# Patient Record
Sex: Male | Born: 1960
Health system: Southern US, Community
[De-identification: ages and names within clinical notes are randomized; demographics above are authoritative.]

## PROBLEM LIST (undated history)

## (undated) DIAGNOSIS — Z720 Tobacco use: Secondary | ICD-10-CM

## (undated) DIAGNOSIS — I213 ST elevation (STEMI) myocardial infarction of unspecified site: Secondary | ICD-10-CM

## (undated) DIAGNOSIS — Z5900 Homelessness unspecified: Secondary | ICD-10-CM

## (undated) DIAGNOSIS — I2510 Atherosclerotic heart disease of native coronary artery without angina pectoris: Secondary | ICD-10-CM

## (undated) DIAGNOSIS — E119 Type 2 diabetes mellitus without complications: Secondary | ICD-10-CM

## (undated) DIAGNOSIS — M549 Dorsalgia, unspecified: Secondary | ICD-10-CM

## (undated) DIAGNOSIS — J449 Chronic obstructive pulmonary disease, unspecified: Secondary | ICD-10-CM

## (undated) DIAGNOSIS — I10 Essential (primary) hypertension: Secondary | ICD-10-CM

## (undated) DIAGNOSIS — F1010 Alcohol abuse, uncomplicated: Secondary | ICD-10-CM

## (undated) HISTORY — DX: Tobacco use: Z72.0

## (undated) HISTORY — DX: Alcohol abuse, uncomplicated: F10.10

## (undated) HISTORY — DX: Atherosclerotic heart disease of native coronary artery without angina pectoris: I25.10

## (undated) HISTORY — DX: Homelessness unspecified: Z59.00

## (undated) HISTORY — PX: APPENDECTOMY: SHX54

---

## 2001-09-12 ENCOUNTER — Emergency Department (HOSPITAL_COMMUNITY): Admission: EM | Admit: 2001-09-12 | Discharge: 2001-09-12 | Payer: Self-pay | Admitting: *Deleted

## 2003-06-19 ENCOUNTER — Encounter: Payer: Self-pay | Admitting: Emergency Medicine

## 2003-06-19 ENCOUNTER — Emergency Department (HOSPITAL_COMMUNITY): Admission: EM | Admit: 2003-06-19 | Discharge: 2003-06-20 | Payer: Self-pay | Admitting: Emergency Medicine

## 2003-06-20 ENCOUNTER — Encounter: Payer: Self-pay | Admitting: Emergency Medicine

## 2003-06-28 ENCOUNTER — Emergency Department (HOSPITAL_COMMUNITY): Admission: EM | Admit: 2003-06-28 | Discharge: 2003-06-28 | Payer: Self-pay | Admitting: Emergency Medicine

## 2004-08-23 ENCOUNTER — Emergency Department (HOSPITAL_COMMUNITY): Admission: EM | Admit: 2004-08-23 | Discharge: 2004-08-24 | Payer: Self-pay | Admitting: Emergency Medicine

## 2006-02-16 ENCOUNTER — Emergency Department (HOSPITAL_COMMUNITY): Admission: EM | Admit: 2006-02-16 | Discharge: 2006-02-16 | Payer: Self-pay

## 2008-11-11 ENCOUNTER — Emergency Department (HOSPITAL_COMMUNITY): Admission: EM | Admit: 2008-11-11 | Discharge: 2008-11-11 | Payer: Self-pay | Admitting: Emergency Medicine

## 2012-03-28 ENCOUNTER — Encounter (HOSPITAL_COMMUNITY): Payer: Self-pay | Admitting: *Deleted

## 2012-03-28 ENCOUNTER — Emergency Department (HOSPITAL_COMMUNITY)
Admission: EM | Admit: 2012-03-28 | Discharge: 2012-03-28 | Disposition: A | Discharge status: Home / Self Care | Payer: Self-pay | Attending: Emergency Medicine | Admitting: Emergency Medicine

## 2012-03-28 DIAGNOSIS — F17200 Nicotine dependence, unspecified, uncomplicated: Secondary | ICD-10-CM | POA: Insufficient documentation

## 2012-03-28 DIAGNOSIS — I10 Essential (primary) hypertension: Secondary | ICD-10-CM | POA: Insufficient documentation

## 2012-03-28 DIAGNOSIS — G8929 Other chronic pain: Secondary | ICD-10-CM | POA: Insufficient documentation

## 2012-03-28 HISTORY — DX: Essential (primary) hypertension: I10

## 2012-03-28 MED ORDER — OXYCODONE-ACETAMINOPHEN 5-325 MG PO TABS: 1.0000 | ORAL_TABLET | Freq: Four times a day (QID) | ORAL | Status: AC | PRN

## 2012-03-28 MED ORDER — PREDNISONE 10 MG PO TABS: 40.0000 mg | ORAL_TABLET | Freq: Every day | ORAL | Status: DC

## 2012-03-28 MED ORDER — KETOROLAC TROMETHAMINE 60 MG/2ML IM SOLN
60.0000 mg | Freq: Once | INTRAMUSCULAR | Status: AC
  Administered 2012-03-28: 60 mg via INTRAMUSCULAR
  Filled 2012-03-28: qty 2
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
## 2012-03-28 NOTE — ED Provider Notes (Signed)
Medical screening examination/treatment/procedure(s) were performed by non-physician practitioner and as supervising physician I was immediately available for consultation/collaboration.  Ethelda Chick, MD 03/28/12 1227

## 2012-03-28 NOTE — Discharge Instructions (Signed)
Please try to set up care with a primary doctor for continued management of your chronic pain. Return to the ER for re-evaluation if you develop new numbness or weakness in your legs, numbness in your groin, or lose your bowel contents on yourself.  If you have no primary doctor, here are some resources that may be helpful:  Medicaid-accepting [facility] Providers:   - [facility]- [address]      [phone]      Mon-Fri 9am-7pm, Sat 9am-1pm   - [facility]- [address] [place]      [phone]   - [facility]- [address], [address] [place]      [phone]   [facility]- [address]      [phone]   - Renaye Rakers- [address] [place], [address]      [phone]      Only accepts [place] patients       after they have her name applied to their card   Self Pay (no insurance) in [place]:   - Sickle Cell Patients: Dr Willey Blade, [facility]      [address] [place]      [phone]   - [phone]   - Physician Referral Service- [phone]   - [facility]- [address] [place]      [phone]   [facility] [place]- [address]   - [facility]- see information above      (Speak to [employer] if you do not have insurance)   - [facility]- [address] [place]      [phone]   - [facility] [place]- [address]      [phone]   - [facility]- [address]      [phone]   - Dr Julio Sicks-  [address], [place]      [phone]   - [facility]- [address]      [phone]   - [facility]- [address]      [phone]      Also [address]      [phone]   - [facility]- [address]      [phone]      1st and 3rd Saturday every month, 10am-1pm Other agencies that provide  inexpensive medical care:    [facility]  [phone]    [facility]  [phone]    [facility]  [phone]    [facility]  [phone]    [facility]  [phone]  General Information: Finding a doctor when you do not have health insurance can be tricky. Although you are not limited by an insurance plan, you are of course limited by her finances and how much but he can pay out of pocket.  What are your options if you don't have health insurance?   1) Find a Librarian, academic and Pay Out of Pocket Although you won't have to find out who is covered by your insurance plan, it is a good idea to ask around and get recommendations. You will then need to call the office and see if the doctor you have chosen will accept you as a new patient and  what types of options they offer for patients who are self-pay. Some doctors offer discounts or will set up payment plans for their patients who do not have insurance, but you will need to ask so you aren't surprised when you get to your appointment.  2) Contact Your Local Health Department Not all health departments have doctors that can see patients for sick visits, but many do, so it is worth a call to see if yours does. If you don't know where your local health department is, you can check in your phone book. The CDC also has a tool to help you locate your state's health department, and many state websites also have listings of all of their local health departments.  3) Find a Walk-in Clinic If your illness is not likely to be very severe or complicated, you may want to try a walk in clinic. These are popping up all over the country in pharmacies, drugstores, and shopping centers. They're usually staffed by nurse practitioners or physician assistants that have been trained to treat common illnesses and complaints. They're usually fairly quick and inexpensive. However, if you have serious medical issues or chronic medical problems,  these are probably not your best option         Chronic Back Pain When back pain lasts longer than 3 months, it is called chronic back pain.This pain can be frustrating, but the cause of the pain is rarely dangerous.People with chronic back pain often go through certain periods that are more intense (flare-ups). CAUSES Chronic back pain can be caused by wear and tear (degeneration) on different structures in your back. These structures may include bones, ligaments, or discs. This degeneration may result in more pressure being placed on the nerves that travel to your legs and feet. This can lead to pain traveling from the low back down the back of the legs. When pain lasts longer than 3 months, it is not unusual for people to experience anxiety or depression. Anxiety and depression can also contribute to low back pain. TREATMENT  Establish a regular exercise plan. This is critical to improving your functional level.   Have a self-management plan for when you flare-up. Flare-ups rarely require a medical visit. Regular exercise will help reduce the intensity and frequency of your flare-ups.   Manage how you feel about your back pain and the rest of your life. Anxiety, depression, and feeling that you cannot alter your back pain have been shown to make back pain more intense and debilitating.   Medicines should never be your only treatment. They should be used along with other treatments to help you return to a more active lifestyle.   Procedures such as injections or surgery may be helpful but are rarely necessary. You may be able to get the same results with physical therapy or chiropractic care.  HOME CARE INSTRUCTIONS  Avoid bending, heavy lifting, prolonged sitting, and activities which make the problem worse.   Continue normal activity as much as possible.   Take brief periods of rest throughout the day to reduce your pain during flare-ups.   Follow your back exercise  rehabilitation program. This can help reduce symptoms and prevent more pain.   Only take over-the-counter or prescription medicines as directed by your caregiver. Muscle relaxants are sometimes prescribed. Narcotic pain medicine is discouraged for long-term pain, since addiction is a possible outcome.   If you smoke, quit.   Eat healthy foods and maintain a recommended body weight.  SEEK IMMEDIATE  MEDICAL CARE IF:   You have weakness or numbness in one of your legs or feet.   You have trouble controlling your bladder or bowels.   You develop nausea, vomiting, abdominal pain, shortness of breath, or fainting.  Document Released: 01/20/2005 Document Revised: 12/02/2011 Document Reviewed: 11/27/2011 [facility] Patient Information 2012 [place], [place].        Hypertension As your heart beats, it forces blood through your arteries. This force is your blood pressure. If the pressure is too high, it is called hypertension (HTN) or high blood pressure. HTN is dangerous because you may have it and not know it. High blood pressure may mean that your heart has to work harder to pump blood. Your arteries may be narrow or stiff. The extra work puts you at risk for heart disease, stroke, and other problems.  Blood pressure consists of two numbers, a higher number over a lower, 110/72, for example. It is stated as "110 over 72." The ideal is below 120 for the top number (systolic) and under 80 for the bottom (diastolic). Write down your blood pressure today. You should pay close attention to your blood pressure if you have certain conditions such as:  Heart failure.   Prior heart attack.   Diabetes   Chronic kidney disease.   Prior stroke.   Multiple risk factors for heart disease.  To see if you have HTN, your blood pressure should be measured while you are seated with your arm held at the level of the heart. It should be measured at least twice. A one-time elevated blood pressure reading  (especially in the Emergency Department) does not mean that you need treatment. There may be conditions in which the blood pressure is different between your right and left arms. It is important to see your caregiver soon for a recheck. Most people have essential hypertension which means that there is not a specific cause. This type of high blood pressure may be lowered by changing lifestyle factors such as:  Stress.   Smoking.   Lack of exercise.   Excessive weight.   Drug/tobacco/alcohol use.   Eating less salt.  Most people do not have symptoms from high blood pressure until it has caused damage to the body. Effective treatment can often prevent, delay or reduce that damage. TREATMENT  When a cause has been identified, treatment for high blood pressure is directed at the cause. There are a large number of medications to treat HTN. These fall into several categories, and your caregiver will help you select the medicines that are best for you. Medications may have side effects. You should review side effects with your caregiver. If your blood pressure stays high after you have made lifestyle changes or started on medicines,   Your medication(s) may need to be changed.   Other problems may need to be addressed.   Be certain you understand your prescriptions, and know how and when to take your medicine.   Be sure to follow up with your caregiver within the time frame advised (usually within two weeks) to have your blood pressure rechecked and to review your medications.   If you are taking more than one medicine to lower your blood pressure, make sure you know how and at what times they should be taken. Taking two medicines at the same time can result in blood pressure that is too low.  SEEK IMMEDIATE MEDICAL CARE IF:  You develop a severe headache, blurred or changing vision, or confusion.  You have unusual weakness or numbness, or a faint feeling.   You have severe chest or  abdominal pain, vomiting, or breathing problems.  MAKE SURE YOU:   Understand these instructions.   Will watch your condition.   Will get help right away if you are not doing well or get worse.  Document Released: 12/13/2005 Document Revised: 12/02/2011 Document Reviewed: 08/02/2008 [facility] Patient Information 2012 [place], [place].
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
## 2012-12-22 DIAGNOSIS — M129 Arthropathy, unspecified: Secondary | ICD-10-CM | POA: Insufficient documentation

## 2012-12-22 DIAGNOSIS — F329 Major depressive disorder, single episode, unspecified: Secondary | ICD-10-CM | POA: Insufficient documentation

## 2013-02-21 ENCOUNTER — Emergency Department (HOSPITAL_COMMUNITY): Payer: Self-pay

## 2013-02-21 ENCOUNTER — Emergency Department (HOSPITAL_COMMUNITY)
Admission: EM | Admit: 2013-02-21 | Discharge: 2013-02-21 | Disposition: A | Discharge status: Home / Self Care | Payer: Self-pay | Attending: Emergency Medicine | Admitting: Emergency Medicine

## 2013-02-21 ENCOUNTER — Encounter (HOSPITAL_COMMUNITY): Payer: Self-pay | Admitting: Emergency Medicine

## 2013-02-21 DIAGNOSIS — F17200 Nicotine dependence, unspecified, uncomplicated: Secondary | ICD-10-CM | POA: Insufficient documentation

## 2013-02-21 DIAGNOSIS — I10 Essential (primary) hypertension: Secondary | ICD-10-CM | POA: Insufficient documentation

## 2013-02-21 DIAGNOSIS — M545 Low back pain, unspecified: Secondary | ICD-10-CM | POA: Insufficient documentation

## 2013-02-21 DIAGNOSIS — M549 Dorsalgia, unspecified: Secondary | ICD-10-CM

## 2013-02-21 DIAGNOSIS — IMO0002 Reserved for concepts with insufficient information to code with codable children: Secondary | ICD-10-CM | POA: Insufficient documentation

## 2013-02-21 DIAGNOSIS — Z87828 Personal history of other (healed) physical injury and trauma: Secondary | ICD-10-CM | POA: Insufficient documentation

## 2013-02-21 MED ORDER — OXYCODONE-ACETAMINOPHEN 5-325 MG PO TABS
2.0000 | ORAL_TABLET | Freq: Once | ORAL | Status: AC
  Administered 2013-02-21: 2 via ORAL
  Filled 2013-02-21: qty 2

## 2013-02-21 MED ORDER — OXYCODONE-ACETAMINOPHEN 5-325 MG PO TABS: 2.0000 | ORAL_TABLET | Freq: Four times a day (QID) | ORAL | Status: DC | PRN

## 2013-02-21 MED ORDER — KETOROLAC TROMETHAMINE 60 MG/2ML IM SOLN
60.0000 mg | Freq: Once | INTRAMUSCULAR | Status: AC
Start: 2013-02-21 — End: 2013-02-21
  Administered 2013-02-21: 60 mg via INTRAMUSCULAR
  Filled 2013-02-21: qty 2

## 2013-02-21 MED ORDER — METHOCARBAMOL 500 MG PO TABS: 500.0000 mg | ORAL_TABLET | Freq: Two times a day (BID) | ORAL | Status: DC
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
## 2013-02-21 NOTE — ED Notes (Signed)
Patient transported to X-ray 
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
## 2013-02-21 NOTE — ED Provider Notes (Signed)
Medical screening examination/treatment/procedure(s) were performed by non-physician practitioner and as supervising physician I was immediately available for consultation/collaboration.   Glynn Octave, MD 02/21/13 [phone]

## 2013-05-07 DIAGNOSIS — G609 Hereditary and idiopathic neuropathy, unspecified: Secondary | ICD-10-CM | POA: Insufficient documentation

## 2013-05-27 ENCOUNTER — Emergency Department (HOSPITAL_COMMUNITY)
Admission: EM | Admit: 2013-05-27 | Discharge: 2013-05-27 | Disposition: A | Discharge status: Home / Self Care | Payer: Self-pay | Attending: Emergency Medicine | Admitting: Emergency Medicine

## 2013-05-27 ENCOUNTER — Encounter (HOSPITAL_COMMUNITY): Payer: Self-pay | Admitting: *Deleted

## 2013-05-27 DIAGNOSIS — M545 Low back pain, unspecified: Secondary | ICD-10-CM | POA: Insufficient documentation

## 2013-05-27 DIAGNOSIS — I10 Essential (primary) hypertension: Secondary | ICD-10-CM | POA: Insufficient documentation

## 2013-05-27 DIAGNOSIS — M549 Dorsalgia, unspecified: Secondary | ICD-10-CM

## 2013-05-27 DIAGNOSIS — Z87828 Personal history of other (healed) physical injury and trauma: Secondary | ICD-10-CM | POA: Insufficient documentation

## 2013-05-27 DIAGNOSIS — F17200 Nicotine dependence, unspecified, uncomplicated: Secondary | ICD-10-CM | POA: Insufficient documentation

## 2013-05-27 DIAGNOSIS — G8929 Other chronic pain: Secondary | ICD-10-CM | POA: Insufficient documentation

## 2013-05-27 HISTORY — DX: Dorsalgia, unspecified: M54.9

## 2013-05-27 MED ORDER — OXYCODONE-ACETAMINOPHEN 5-325 MG PO TABS
2.0000 | ORAL_TABLET | Freq: Once | ORAL | Status: AC
  Administered 2013-05-27: 2 via ORAL
  Filled 2013-05-27: qty 2

## 2013-05-27 NOTE — ED Provider Notes (Signed)
Medical screening examination/treatment/procedure(s) were performed by non-physician practitioner and as supervising physician I was immediately available for consultation/collaboration.    Vida Roller, MD 05/27/13 [phone]
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
## 2014-01-29 DIAGNOSIS — M549 Dorsalgia, unspecified: Secondary | ICD-10-CM | POA: Insufficient documentation

## 2014-01-29 DIAGNOSIS — J45909 Unspecified asthma, uncomplicated: Secondary | ICD-10-CM | POA: Insufficient documentation

## 2014-03-19 DIAGNOSIS — IMO0002 Reserved for concepts with insufficient information to code with codable children: Secondary | ICD-10-CM | POA: Insufficient documentation

## 2014-03-19 DIAGNOSIS — Z6833 Body mass index (BMI) 33.0-33.9, adult: Secondary | ICD-10-CM | POA: Insufficient documentation

## 2014-03-19 DIAGNOSIS — I10 Essential (primary) hypertension: Secondary | ICD-10-CM | POA: Insufficient documentation

## 2014-03-19 DIAGNOSIS — E1165 Type 2 diabetes mellitus with hyperglycemia: Secondary | ICD-10-CM | POA: Insufficient documentation

## 2014-12-10 ENCOUNTER — Emergency Department (HOSPITAL_COMMUNITY)
Admission: EM | Admit: 2014-12-10 | Discharge: 2014-12-10 | Disposition: A | Discharge status: Home / Self Care | Payer: Self-pay | Attending: Emergency Medicine | Admitting: Emergency Medicine

## 2014-12-10 ENCOUNTER — Encounter (HOSPITAL_COMMUNITY): Payer: Self-pay | Admitting: Emergency Medicine

## 2014-12-10 ENCOUNTER — Ambulatory Visit (HOSPITAL_COMMUNITY): Admission: RE | Admit: 2014-12-10 | Payer: Self-pay | Source: Ambulatory Visit

## 2014-12-10 ENCOUNTER — Emergency Department (HOSPITAL_COMMUNITY): Payer: Self-pay

## 2014-12-10 DIAGNOSIS — W270XXA Contact with workbench tool, initial encounter: Secondary | ICD-10-CM | POA: Insufficient documentation

## 2014-12-10 DIAGNOSIS — Y9289 Other specified places as the place of occurrence of the external cause: Secondary | ICD-10-CM | POA: Insufficient documentation

## 2014-12-10 DIAGNOSIS — I10 Essential (primary) hypertension: Secondary | ICD-10-CM | POA: Insufficient documentation

## 2014-12-10 DIAGNOSIS — Z23 Encounter for immunization: Secondary | ICD-10-CM | POA: Insufficient documentation

## 2014-12-10 DIAGNOSIS — Z720 Tobacco use: Secondary | ICD-10-CM | POA: Insufficient documentation

## 2014-12-10 DIAGNOSIS — S61412A Laceration without foreign body of left hand, initial encounter: Secondary | ICD-10-CM | POA: Insufficient documentation

## 2014-12-10 DIAGNOSIS — Y998 Other external cause status: Secondary | ICD-10-CM | POA: Insufficient documentation

## 2014-12-10 DIAGNOSIS — Y9389 Activity, other specified: Secondary | ICD-10-CM | POA: Insufficient documentation

## 2014-12-10 MED ORDER — OXYCODONE-ACETAMINOPHEN 5-325 MG PO TABS: 1.0000 | ORAL_TABLET | ORAL | Status: DC | PRN

## 2014-12-10 MED ORDER — OXYCODONE-ACETAMINOPHEN 5-325 MG PO TABS
1.0000 | ORAL_TABLET | Freq: Once | ORAL | Status: AC
  Administered 2014-12-10: 1 via ORAL
  Filled 2014-12-10: qty 1

## 2014-12-10 MED ORDER — LIDOCAINE HCL (PF) 1 % IJ SOLN
5.0000 mL | Freq: Once | INTRAMUSCULAR | Status: AC
  Administered 2014-12-10: 5 mL via INTRADERMAL
  Filled 2014-12-10: qty 5

## 2014-12-10 MED ORDER — TETANUS-DIPHTH-ACELL PERTUSSIS 5-2.5-18.5 LF-MCG/0.5 IM SUSP
0.5000 mL | Freq: Once | INTRAMUSCULAR | Status: AC
  Administered 2014-12-10: 0.5 mL via INTRAMUSCULAR
  Filled 2014-12-10: qty 0.5

## 2014-12-10 NOTE — ED Provider Notes (Signed)
CSN: [id number]     Arrival date & time 12/10/14  1836 History  This chart was scribed for non-physician practitioner, Tommy RainwaterShari A Golda Zavalza PA-C, working with Purvis SheffieldForrest Harrison, MD by Freida Busmaniana Omoyeni, ED Scribe. This patient was seen in room WTR6/WTR6 and the patient's care was started at 8:18 PM.    Chief Complaint  Patient presents with  . Extremity Laceration      The history is provided by the patient. No language interpreter was used.     HPI Comments:  Warren Salas is a 53 y.o. male who presents to the Emergency Department complaining of a laceration to his left hand after an accidental stabbing injury with a 1 inch chisel about 2 hours ago. He reports moderate- severe pain that has been constant since injury. He believes his last tetanus was about 5-6 years ago. Pt is right hand dominant. No alleviating factors noted.   Past Medical History  Diagnosis Date  . Hypertension   . Back pain    Past Surgical History  Procedure Laterality Date  . Appendectomy     Family History  Problem Relation Age of Onset  . Hypertension Mother   . Diabetes Mother   . Hypertension Father   . Diabetes Father    History  Substance Use Topics  . Smoking status: Current Every Day Smoker  . Smokeless tobacco: Not on file  . Alcohol Use: Yes    Review of Systems  Constitutional: Negative for fever and chills.  HENT: Negative for congestion and nosebleeds.   Eyes: Negative for redness.  Respiratory: Negative for shortness of breath.   Cardiovascular: Negative for chest pain.  Gastrointestinal: Negative for abdominal pain.  Genitourinary: Negative for difficulty urinating.  Skin: Positive for wound.  Neurological: Negative for headaches.      Allergies  Review of patient's allergies indicates no known allergies.  Home Medications   Prior to Admission medications   Medication Sig Start Date End Date Taking? Authorizing Provider  ibuprofen (ADVIL,MOTRIN) 200 MG tablet Take 400 mg by  mouth every 6 (six) hours as needed. pain    Historical Provider, MD  naproxen sodium (ANAPROX) 220 MG tablet Take 220 mg by mouth every 6 (six) hours as needed (pain).    Historical Provider, MD   BP 170/93 mmHg  Pulse 92  Temp(Src) 98.3 F (36.8 C) (Oral)  Resp 18  SpO2 100% Physical Exam  Constitutional: He is oriented to person, place, and time. He appears well-developed and well-nourished.  HENT:  Head: Normocephalic and atraumatic.  Eyes: Conjunctivae are normal.  Cardiovascular: Normal rate.   Pulmonary/Chest: Effort normal.  Abdominal: He exhibits no distension.  Musculoskeletal:  3 cm laceration to left hand dorsum at inner phalangeal space between first and second digits noted FROM all digits of the left hand Distal cap refill intact  Neurological: He is alert and oriented to person, place, and time.  Skin: Skin is warm and dry.  Psychiatric: He has a normal mood and affect.  Nursing note and vitals reviewed.   ED Course  Procedures   DIAGNOSTIC STUDIES:  Oxygen Saturation is 100% on RA, normal by my interpretation.    COORDINATION OF CARE:  8:23 PM Discussed treatment plan with pt at bedside and pt agreed to plan.  LACERATION REPAIR Performed by: Arnoldo HookerShari A Egbert Seidel PA-C, Consent: Verbal consent obtained. Risks and benefits: risks, benefits and alternatives were discussed Patient identity confirmed: provided demographic data Time out performed prior to procedure Prepped and Draped in normal  sterile fashion Wound explored, irrigated copiously; no visualized tendons  Laceration Location: left hand dorsum at inner phalangeal space between first and second digits Laceration Length: 3 cm linear No Foreign Bodies seen or palpated Anesthesia: local infiltration Local anesthetic: lidocaine 1%  Anesthetic total: 2 ml Irrigation method: syringe Amount of cleaning: standard Skin closure: yes Number of sutures or staples: 6 sutures with 4-o proline Technique: simple  interrupted Patient tolerance: Patient tolerated the procedure well with no immediate complications.  Labs Review Labs Reviewed - No data to display  Imaging Review No results found.   EKG Interpretation None      MDM   Final diagnoses:  None    1. Laceration, left hand  Uncomplicated laceration to hand, repaired as above. Tetanus updated, pain managed. Clean wound.   I personally performed the services described in this documentation, which was scribed in my presence. The recorded information has been reviewed and is accurate.     Arnoldo HookerShari A Onda Kattner, PA-C 12/11/14 2054  Purvis SheffieldForrest Harrison, MD 12/12/14 [phone]

## 2014-12-10 NOTE — ED Notes (Signed)
Pt accidentally stabbed his left hand with a wood chisel, held pressure to wound, states hand was "gushing."

## 2014-12-10 NOTE — Discharge Instructions (Signed)
Td Vaccine (Tetanus and Diphtheria): What You Need to Know °1. Why get vaccinated? °Tetanus  and diphtheria are very serious diseases. They are rare in the [place] today, but people who do become infected often have severe complications. Td vaccine is used to protect adolescents and adults from both of these diseases. °Both tetanus and diphtheria are infections caused by bacteria. Diphtheria spreads from person to person through coughing or sneezing. Tetanus-causing bacteria enter the body through cuts, scratches, or wounds. °TETANUS (Lockjaw) causes painful muscle tightening and stiffness, usually all over the body. °· It can lead to tightening of muscles in the head and neck so you can't open your mouth, swallow, or sometimes even breathe. Tetanus kills about 1 out of every 5 people who are infected. °DIPHTHERIA can cause a thick coating to form in the back of the throat. °· It can lead to breathing problems, paralysis, heart failure, and death. °Before vaccines, the [place] saw as many as 200,000 cases a year of diphtheria and hundreds of cases of tetanus. Since vaccination began, cases of both diseases have dropped by about 99%. °2. Td vaccine °Td vaccine can protect adolescents and adults from tetanus and diphtheria. Td is usually given as a booster dose every 10 years but it can also be given earlier after a severe and dirty wound or burn. °Your doctor can give you more information. °Td may safely be given at the same time as other vaccines. °3. Some people should not get this vaccine °· If you ever had a life-threatening allergic reaction after a dose of any tetanus or diphtheria containing vaccine, OR if you have a severe allergy to any part of this vaccine, you should not get Td. Tell your doctor if you have any severe allergies. °· Talk to your doctor if you: °¨ have epilepsy or another nervous system problem, °¨ had severe pain or swelling after any vaccine containing diphtheria or  tetanus, °¨ ever had Guillain Barré Syndrome (GBS), °¨ aren't feeling well on the day the shot is scheduled. °4. Risks of a vaccine reaction °With a vaccine, like any medicine, there is a chance of side effects. These are usually mild and go away on their own. °Serious side effects are also possible, but are very rare. °Most people who get Td vaccine do not have any problems with it. °Mild Problems  following Td °(Did not interfere with activities) °· Pain where the shot was given (about 8 people in 10) °· Redness or swelling where the shot was given (about 1 person in 3) °· Mild fever (about 1 person in 15) °· Headache or Tiredness (uncommon) °Moderate Problems following Td °(Interfered with activities, but did not require medical attention) °· Fever over 102°F (rare) °Severe Problems  following Td °(Unable to perform usual activities; required medical attention) °· Swelling, severe pain, bleeding and/or redness in the arm where the shot was given (rare). °Problems that could happen after any vaccine: °· Brief fainting spells can happen after any medical procedure, including vaccination. Sitting or lying down for about 15 minutes can help prevent fainting, and injuries caused by a fall. Tell your doctor if you feel dizzy, or have vision changes or ringing in the ears. °· Severe shoulder pain and reduced range of motion in the arm where a shot was given can happen, very rarely, after a vaccination. °· Severe allergic reactions from a vaccine are very rare, estimated at less than 1 in a million doses. If one were to occur,   it would usually be within a few minutes to a few hours after the vaccination. 5. What if there is a serious reaction? What should I look for?  Look for anything that concerns you, such as signs of a severe allergic reaction, very high fever, or behavior changes. Signs of a severe allergic reaction can include hives, swelling of the face and throat, difficulty breathing, a fast heartbeat,  dizziness, and weakness. These would usually start a few minutes to a few hours after the vaccination. What should I do?  If you think it is a severe allergic reaction or other emergency that can't wait, call 9-1-1 or get the person to the nearest hospital. Otherwise, call your doctor.  Afterward, the reaction should be reported to the Vaccine Adverse Event Reporting System (VAERS). Your doctor might file this report, or you can do it yourself through the VAERS web site at www.vaers.LAgents.nohhs.gov, or by calling 1-(269) 394-2068. VAERS is only for reporting reactions. They do not give medical advice. 6. The National Vaccine Injury Compensation Program The [employer] Vaccine Injury Compensation Program (VICP) is a federal program that was created to compensate people who may have been injured by certain vaccines. Persons who believe they may have been injured by a vaccine can learn about the program and about filing a claim by calling 1-(458)123-5707 or visiting the VICP website at SpiritualWord.atwww.hrsa.gov/vaccinecompensation. 7. How can I learn more?  Ask your doctor.  Contact your local or state health department.  Contact the Centers for Disease Control and Prevention (CDC):  Call (220) 347-28401-989-004-7793 (1-800-CDC-INFO)  Visit CDC's website at PicCapture.uywww.cdc.gov/vaccines CDC Td Vaccine Interim VIS (01/30/13) Document Released: 10/10/2006 Document Revised: 04/29/2014 Document Reviewed: 03/27/2014 [facility] Patient Information 2015 [employer] [place]. This information is not intended to replace advice given to you by your health care provider. Make sure you discuss any questions you have with your health care provider. Sutured Wound Care Sutures are stitches that can be used to close wounds. Wound care helps prevent pain and infection.  HOME CARE INSTRUCTIONS   Rest and elevate the injured area until all the pain and swelling are gone.  Only take over-the-counter or prescription medicines for pain, discomfort, or fever as directed  by your caregiver.  After 48 hours, gently wash the area with mild soap and water once a day, or as directed. Rinse off the soap. Pat the area dry with a clean towel. Do not rub the wound. This may cause bleeding.  Follow your caregiver's instructions for how often to change the bandage (dressing). Stop using a dressing after 2 days or after the wound stops draining.  If the dressing sticks, moisten it with soapy water and gently remove it.  Apply ointment on the wound as directed.  Avoid stretching a sutured wound.  Drink enough fluids to keep your urine clear or pale yellow.  Follow up with your caregiver for suture removal as directed.  Use sunscreen on your wound for the next 3 to 6 months so the scar will not darken. SEEK IMMEDIATE MEDICAL CARE IF:   Your wound becomes red, swollen, hot, or tender.  You have increasing pain in the wound.  You have a red streak that extends from the wound.  There is pus coming from the wound.  You have a fever.  You have shaking chills.  There is a bad smell coming from the wound.  You have persistent bleeding from the wound. MAKE SURE YOU:   Understand these instructions.  Will watch your condition.  Will get help right away if you are not doing well or get worse. Document Released: 01/20/2005 Document Revised: 03/06/2012 Document Reviewed: 04/18/2011 [facility] Patient Information 2015 [place], [place]. This information is not intended to replace advice given to you by your health care provider. Make sure you discuss any questions you have with your health care provider.

## 2016-01-26 DIAGNOSIS — Z0289 Encounter for other administrative examinations: Secondary | ICD-10-CM | POA: Diagnosis not present

## 2016-01-26 DIAGNOSIS — G8929 Other chronic pain: Secondary | ICD-10-CM | POA: Diagnosis not present

## 2016-01-26 DIAGNOSIS — F329 Major depressive disorder, single episode, unspecified: Secondary | ICD-10-CM | POA: Diagnosis not present

## 2016-01-26 DIAGNOSIS — I10 Essential (primary) hypertension: Secondary | ICD-10-CM | POA: Diagnosis not present

## 2016-01-26 DIAGNOSIS — Z23 Encounter for immunization: Secondary | ICD-10-CM | POA: Diagnosis not present

## 2016-01-26 DIAGNOSIS — E118 Type 2 diabetes mellitus with unspecified complications: Secondary | ICD-10-CM | POA: Diagnosis not present

## 2016-01-26 DIAGNOSIS — E1165 Type 2 diabetes mellitus with hyperglycemia: Secondary | ICD-10-CM | POA: Diagnosis not present

## 2016-01-26 DIAGNOSIS — Z9181 History of falling: Secondary | ICD-10-CM | POA: Diagnosis not present

## 2016-01-26 DIAGNOSIS — M545 Low back pain: Secondary | ICD-10-CM | POA: Diagnosis not present

## 2016-01-27 DIAGNOSIS — Z9181 History of falling: Secondary | ICD-10-CM | POA: Insufficient documentation

## 2016-05-03 DIAGNOSIS — F1490 Cocaine use, unspecified, uncomplicated: Secondary | ICD-10-CM | POA: Insufficient documentation

## 2017-01-06 ENCOUNTER — Encounter (HOSPITAL_COMMUNITY): Payer: Self-pay | Admitting: Emergency Medicine

## 2017-01-06 ENCOUNTER — Emergency Department (HOSPITAL_COMMUNITY)
Admission: EM | Admit: 2017-01-06 | Discharge: 2017-01-06 | Disposition: A | Discharge status: Home / Self Care | Payer: Medicare Other | Attending: Emergency Medicine | Admitting: Emergency Medicine

## 2017-01-06 DIAGNOSIS — I10 Essential (primary) hypertension: Secondary | ICD-10-CM | POA: Diagnosis not present

## 2017-01-06 DIAGNOSIS — F17200 Nicotine dependence, unspecified, uncomplicated: Secondary | ICD-10-CM | POA: Diagnosis not present

## 2017-01-06 DIAGNOSIS — F1410 Cocaine abuse, uncomplicated: Secondary | ICD-10-CM | POA: Diagnosis not present

## 2017-01-06 DIAGNOSIS — Z79899 Other long term (current) drug therapy: Secondary | ICD-10-CM | POA: Insufficient documentation

## 2017-01-06 DIAGNOSIS — R030 Elevated blood-pressure reading, without diagnosis of hypertension: Secondary | ICD-10-CM | POA: Diagnosis not present

## 2017-01-06 DIAGNOSIS — F22 Delusional disorders: Secondary | ICD-10-CM | POA: Diagnosis present

## 2017-01-06 DIAGNOSIS — Z7982 Long term (current) use of aspirin: Secondary | ICD-10-CM | POA: Diagnosis not present

## 2017-01-06 LAB — BASIC METABOLIC PANEL
Anion gap: 12 (ref 5–15)
CO2: 21 mmol/L — ABNORMAL LOW (ref 22–32)
CREATININE: 0.67 mg/dL (ref 0.61–1.24)
Calcium: 8.7 mg/dL — ABNORMAL LOW (ref 8.9–10.3)
Chloride: 105 mmol/L (ref 101–111)
Glucose, Bld: 97 mg/dL (ref 65–99)
Potassium: 3.9 mmol/L (ref 3.5–5.1)
SODIUM: 138 mmol/L (ref 135–145)

## 2017-01-06 LAB — RAPID URINE DRUG SCREEN, HOSP PERFORMED
Amphetamines: NOT DETECTED
BARBITURATES: NOT DETECTED
Benzodiazepines: POSITIVE — AB
Cocaine: POSITIVE — AB
Opiates: NOT DETECTED
TETRAHYDROCANNABINOL: NOT DETECTED

## 2017-01-06 LAB — SALICYLATE LEVEL

## 2017-01-06 LAB — CBC
HCT: 52.2 % — ABNORMAL HIGH (ref 39.0–52.0)
Hemoglobin: 19 g/dL — ABNORMAL HIGH (ref 13.0–17.0)
MCH: 32.9 pg (ref 26.0–34.0)
MCHC: 36.4 g/dL — AB (ref 30.0–36.0)
MCV: 90.3 fL (ref 78.0–100.0)
PLATELETS: 165 10*3/uL (ref 150–400)
RBC: 5.78 MIL/uL (ref 4.22–5.81)
RDW: 13.4 % (ref 11.5–15.5)
WBC: 7.8 10*3/uL (ref 4.0–10.5)

## 2017-01-06 LAB — ETHANOL: ALCOHOL ETHYL (B): 182 mg/dL — AB (ref <5)

## 2017-01-06 LAB — ACETAMINOPHEN LEVEL: Acetaminophen (Tylenol), Serum: <10 ug/mL — ABNORMAL LOW (ref 10–30)

## 2017-01-06 MED ORDER — LORAZEPAM 1 MG PO TABS
1.0000 mg | ORAL_TABLET | Freq: Once | ORAL | Status: AC
  Administered 2017-01-06: 1 mg via ORAL
  Filled 2017-01-06: qty 1

## 2017-01-06 NOTE — ED Notes (Signed)
Bed: [facility] Expected date:  Expected time:  Means of arrival:  Comments: EMS/ETOH 

## 2017-01-06 NOTE — ED Notes (Signed)
Patient left AMA.

## 2017-01-06 NOTE — ED Triage Notes (Signed)
Per EMS patient brought in from home for paranoia after drug use.  Patient was found on his front porch of his house. Patient reports that he thought he bought heroin. Patient sniffed yellow substance that he thought was heroin but either laced or meth due to patients paranoia reaction from drugs which is not his usual reaction to heroin.  Patient initially tachycardic around 130, now 104, BP 132/95, R 22, O2 98% on room air. Cbg 92.  18 in left AC Versed 2.5mg  given in route.

## 2017-01-07 NOTE — ED Provider Notes (Signed)
[facility]-EMERGENCY DEPT Provider Note   CSN: [id number] Arrival date & time: 01/06/17  1158     History   Chief Complaint Chief Complaint  Patient presents with  . paranoia after drug use    HPI Warren Salas is a 56 y.o. male.  HPI   56 yo M with PMHx of HTN here with drug abuse. Pt states that he snorted what he thought was heroin earlier today. It was a yellow powder, which is what his heroin "normally looks like." Shortly after snorting, pt states he became "really agitated" with a "racing heart," which is unusual for him with heroin. He became concerned that he may have gotten "something else" rather than heroin and called EMS. Pt initially agitated on EMS arrival but received 2.5 mg versed with improvement. Currently, pt states he feels much better. No longer feels agitated. Denies any CP, SOB, palpitation. No SI, HI, or AVH.  Past Medical History:  Diagnosis Date  . Back pain   . Hypertension     There are no active problems to display for this patient.   Past Surgical History:  Procedure Laterality Date  . APPENDECTOMY         Home Medications    Prior to Admission medications   Medication Sig Start Date End Date Taking? Authorizing Provider  albuterol (PROVENTIL HFA;VENTOLIN HFA) 108 (90 Base) MCG/ACT inhaler Inhale 1-2 puffs into the lungs every 4 (four) hours as needed for wheezing or shortness of breath.  05/07/13  Yes Historical Provider, MD  aspirin EC 81 MG tablet Take 81 mg by mouth daily. 05/13/15  Yes Historical Provider, MD  atorvastatin (LIPITOR) 10 MG tablet Take 10 mg by mouth daily. 05/13/15  Yes Historical Provider, MD  budesonide-formoterol (SYMBICORT) 160-4.5 MCG/ACT inhaler Inhale 2 puffs into the lungs 2 (two) times daily as needed (For shortness of breath.).    Yes Historical Provider, MD  DULoxetine (CYMBALTA) 30 MG capsule Take 30 mg by mouth 2 (two) times daily.  01/26/16 01/26/17 Yes Historical Provider, MD  HYDROcodone-acetaminophen (NORCO)  7.5-325 MG tablet Take 1 tablet by mouth every 8 (eight) hours as needed (For pain.).  04/28/16  Yes Historical Provider, MD  lisinopril (PRINIVIL,ZESTRIL) 10 MG tablet Take 10 mg by mouth daily. 01/26/16  Yes Historical Provider, MD  metFORMIN (GLUCOPHAGE) 500 MG tablet Take 500 mg by mouth 2 (two) times daily. 01/26/16  Yes Historical Provider, MD  oxyCODONE-acetaminophen (PERCOCET/ROXICET) 5-325 MG per tablet Take 1-2 tablets by mouth every 4 (four) hours as needed for severe pain. 12/10/14  Yes Shari Upstill, PA-C  pregabalin (LYRICA) 50 MG capsule Take 50 mg by mouth 2 (two) times daily. 05/07/13  Yes Historical Provider, MD    Family History Family History  Problem Relation Age of Onset  . Hypertension Mother   . Diabetes Mother   . Hypertension Father   . Diabetes Father     Social History Social History  Substance Use Topics  . Smoking status: Current Every Day Smoker  . Smokeless tobacco: Never Used  . Alcohol use Yes     Allergies   Patient has no known allergies.   Review of Systems Review of Systems  Constitutional: Positive for fatigue. Negative for chills and fever.  HENT: Negative for congestion and rhinorrhea.   Eyes: Negative for visual disturbance.  Respiratory: Negative for cough, shortness of breath and wheezing.   Cardiovascular: Negative for chest pain and leg swelling.  Gastrointestinal: Negative for abdominal pain, diarrhea, nausea and vomiting.  Genitourinary: Negative for dysuria and flank pain.  Musculoskeletal: Negative for neck pain and neck stiffness.  Skin: Negative for rash and wound.  Allergic/Immunologic: Negative for immunocompromised state.  Neurological: Negative for syncope, weakness and headaches.  All other systems reviewed and are negative.    Physical Exam Updated Vital Signs BP 118/82 (BP Location: Left Arm)   Pulse 100   Temp 98.5 F (36.9 C) (Oral)   Resp 18   SpO2 99%   Physical Exam  Constitutional: He is oriented to  person, place, and time. He appears well-developed and well-nourished. No distress.  HENT:  Head: Normocephalic and atraumatic.  Eyes: Conjunctivae are normal.  Neck: Neck supple.  Cardiovascular: Normal rate, regular rhythm and normal heart sounds.  Exam reveals no friction rub.   No murmur heard. Pulmonary/Chest: Effort normal and breath sounds normal. No respiratory distress. He has no wheezes. He has no rales.  Abdominal: Soft. Bowel sounds are normal. He exhibits no distension.  Musculoskeletal: He exhibits no edema.  Neurological: He is alert and oriented to person, place, and time. He exhibits normal muscle tone.  Skin: Skin is warm. Capillary refill takes less than 2 seconds.  Psychiatric: He has a normal mood and affect.  Nursing note and vitals reviewed.    ED Treatments / Results  Labs (all labs ordered are listed, but only abnormal results are displayed) Labs Reviewed  CBC - Abnormal; Notable for the following:       Result Value   Hemoglobin 19.0 (*)    HCT 52.2 (*)    MCHC 36.4 (*)    All other components within normal limits  BASIC METABOLIC PANEL - Abnormal; Notable for the following:    CO2 21 (*)    BUN <5 (*)    Calcium 8.7 (*)    All other components within normal limits  ETHANOL - Abnormal; Notable for the following:    Alcohol, Ethyl (B) 182 (*)    All other components within normal limits  ACETAMINOPHEN LEVEL - Abnormal; Notable for the following:    Acetaminophen (Tylenol), Serum <10 (*)    All other components within normal limits  RAPID URINE DRUG SCREEN, HOSP PERFORMED - Abnormal; Notable for the following:    Cocaine POSITIVE (*)    Benzodiazepines POSITIVE (*)    All other components within normal limits  SALICYLATE LEVEL    EKG  EKG Interpretation None       Radiology No results found.  Procedures Procedures (including critical care time)  Medications Ordered in ED Medications  LORazepam (ATIVAN) tablet 1 mg (1 mg Oral Given  01/06/17 1422)     Initial Impression / Assessment and Plan / ED Course  I have reviewed the triage vital signs and the nursing notes.  Pertinent labs & imaging results that were available during my care of the patient were reviewed by me and considered in my medical decision making (see chart for details).  Clinical Course     56 yo M with PMHx of HTN here with mild, resolved agitation after snorting what he thought was heroin. Per EMS report, pt also known for meth use according to neighbors. On arrival, VSS with exception of mild tachycardia. He does not appear agitated at this time and is ambulatory, not hallucinating. Will check labs, monitor in ED. Based on pt's sx and description, suspect he snorted methamphetamine versus cocaine, or some other stimulant.  Lab work as above and is largely unremarkable/reassuring. Ethanol 182 but pt admits  to being a heavy, regular drinker and he is clinically sober currently. APAP, salicylate neg. UDS + cocaine and benzos; cocaine could explain his sx. No CP, HA, and 12-lead from EMS unremarkable.   Pt was given ativan PO which resolved his tachycardia, as well as a meal. He was ambulatory throughout ED. While awaiting final disposition, pt reportedly eloped from ED. Security unable to find him. Attempted to contact him without success. Nonetheless, pt did appear clinically sober and lab work unremarkable as above.  Final Clinical Impressions(s) / ED Diagnoses   Final diagnoses:  Cocaine abuse    New Prescriptions Discharge Medication List as of 01/06/2017  2:52 PM       Shaune Pollackameron Fedor Kazmierski, MD 01/07/17 [phone]

## 2017-10-27 DIAGNOSIS — I213 ST elevation (STEMI) myocardial infarction of unspecified site: Secondary | ICD-10-CM

## 2017-10-27 HISTORY — DX: ST elevation (STEMI) myocardial infarction of unspecified site: I21.3

## 2017-11-01 ENCOUNTER — Encounter (HOSPITAL_COMMUNITY)
Admission: EM | Disposition: A | Discharge status: Home / Self Care | Payer: Self-pay | Source: Home / Self Care | Attending: Interventional Cardiology

## 2017-11-01 ENCOUNTER — Encounter (HOSPITAL_COMMUNITY): Payer: Self-pay | Admitting: *Deleted

## 2017-11-01 ENCOUNTER — Other Ambulatory Visit: Payer: Self-pay

## 2017-11-01 ENCOUNTER — Emergency Department (HOSPITAL_COMMUNITY): Payer: Medicare Other

## 2017-11-01 ENCOUNTER — Inpatient Hospital Stay (HOSPITAL_COMMUNITY)
Admission: EM | Admit: 2017-11-01 | Discharge: 2017-11-04 | DRG: 247 | Disposition: A | Discharge status: Home / Self Care | Payer: Medicare Other | Attending: Interventional Cardiology | Admitting: Interventional Cardiology

## 2017-11-01 DIAGNOSIS — F17200 Nicotine dependence, unspecified, uncomplicated: Secondary | ICD-10-CM | POA: Diagnosis not present

## 2017-11-01 DIAGNOSIS — Z720 Tobacco use: Secondary | ICD-10-CM | POA: Diagnosis not present

## 2017-11-01 DIAGNOSIS — E1159 Type 2 diabetes mellitus with other circulatory complications: Secondary | ICD-10-CM

## 2017-11-01 DIAGNOSIS — F17210 Nicotine dependence, cigarettes, uncomplicated: Secondary | ICD-10-CM | POA: Diagnosis not present

## 2017-11-01 DIAGNOSIS — Z6825 Body mass index (BMI) 25.0-25.9, adult: Secondary | ICD-10-CM

## 2017-11-01 DIAGNOSIS — J449 Chronic obstructive pulmonary disease, unspecified: Secondary | ICD-10-CM | POA: Diagnosis not present

## 2017-11-01 DIAGNOSIS — Z23 Encounter for immunization: Secondary | ICD-10-CM | POA: Diagnosis not present

## 2017-11-01 DIAGNOSIS — I213 ST elevation (STEMI) myocardial infarction of unspecified site: Secondary | ICD-10-CM

## 2017-11-01 DIAGNOSIS — Z79899 Other long term (current) drug therapy: Secondary | ICD-10-CM

## 2017-11-01 DIAGNOSIS — I472 Ventricular tachycardia: Secondary | ICD-10-CM | POA: Diagnosis not present

## 2017-11-01 DIAGNOSIS — E785 Hyperlipidemia, unspecified: Secondary | ICD-10-CM | POA: Diagnosis present

## 2017-11-01 DIAGNOSIS — I2119 ST elevation (STEMI) myocardial infarction involving other coronary artery of inferior wall: Secondary | ICD-10-CM | POA: Diagnosis not present

## 2017-11-01 DIAGNOSIS — Z833 Family history of diabetes mellitus: Secondary | ICD-10-CM

## 2017-11-01 DIAGNOSIS — R079 Chest pain, unspecified: Secondary | ICD-10-CM | POA: Diagnosis not present

## 2017-11-01 DIAGNOSIS — Z8249 Family history of ischemic heart disease and other diseases of the circulatory system: Secondary | ICD-10-CM | POA: Diagnosis not present

## 2017-11-01 DIAGNOSIS — I2510 Atherosclerotic heart disease of native coronary artery without angina pectoris: Secondary | ICD-10-CM | POA: Diagnosis not present

## 2017-11-01 DIAGNOSIS — Z7982 Long term (current) use of aspirin: Secondary | ICD-10-CM | POA: Diagnosis not present

## 2017-11-01 DIAGNOSIS — I2111 ST elevation (STEMI) myocardial infarction involving right coronary artery: Secondary | ICD-10-CM | POA: Diagnosis not present

## 2017-11-01 DIAGNOSIS — E669 Obesity, unspecified: Secondary | ICD-10-CM | POA: Diagnosis present

## 2017-11-01 DIAGNOSIS — E119 Type 2 diabetes mellitus without complications: Secondary | ICD-10-CM | POA: Diagnosis present

## 2017-11-01 DIAGNOSIS — I10 Essential (primary) hypertension: Secondary | ICD-10-CM | POA: Diagnosis present

## 2017-11-01 DIAGNOSIS — I493 Ventricular premature depolarization: Secondary | ICD-10-CM | POA: Diagnosis not present

## 2017-11-01 DIAGNOSIS — E7800 Pure hypercholesterolemia, unspecified: Secondary | ICD-10-CM | POA: Diagnosis not present

## 2017-11-01 DIAGNOSIS — Z955 Presence of coronary angioplasty implant and graft: Secondary | ICD-10-CM

## 2017-11-01 HISTORY — DX: Chronic obstructive pulmonary disease, unspecified: J44.9

## 2017-11-01 HISTORY — DX: Type 2 diabetes mellitus without complications: E11.9

## 2017-11-01 HISTORY — PX: CORONARY/GRAFT ACUTE MI REVASCULARIZATION: CATH118305

## 2017-11-01 HISTORY — PX: LEFT HEART CATH AND CORONARY ANGIOGRAPHY: CATH118249

## 2017-11-01 LAB — I-STAT CHEM 8, ED
BUN: <3 mg/dL — ABNORMAL LOW (ref 6–20)
Calcium, Ion: 1.1 mmol/L — ABNORMAL LOW (ref 1.15–1.40)
Chloride: 99 mmol/L — ABNORMAL LOW (ref 101–111)
Creatinine, Ser: 0.7 mg/dL (ref 0.61–1.24)
GLUCOSE: 195 mg/dL — AB (ref 65–99)
HCT: 48 % (ref 39.0–52.0)
HEMOGLOBIN: 16.3 g/dL (ref 13.0–17.0)
POTASSIUM: 4.1 mmol/L (ref 3.5–5.1)
Sodium: 133 mmol/L — ABNORMAL LOW (ref 135–145)
TCO2: 24 mmol/L (ref 22–32)

## 2017-11-01 LAB — GLUCOSE, CAPILLARY: GLUCOSE-CAPILLARY: 122 mg/dL — AB (ref 65–99)

## 2017-11-01 LAB — CBC
HEMATOCRIT: 47.3 % (ref 39.0–52.0)
Hemoglobin: 16.5 g/dL (ref 13.0–17.0)
MCH: 34 pg (ref 26.0–34.0)
MCHC: 34.9 g/dL (ref 30.0–36.0)
MCV: 97.5 fL (ref 78.0–100.0)
Platelets: 202 10*3/uL (ref 150–400)
RBC: 4.85 MIL/uL (ref 4.22–5.81)
RDW: 14.2 % (ref 11.5–15.5)
WBC: 5.6 10*3/uL (ref 4.0–10.5)

## 2017-11-01 LAB — LIPID PANEL
CHOL/HDL RATIO: 1.9 ratio
CHOLESTEROL: 145 mg/dL (ref 0–200)
HDL: 77 mg/dL (ref >40)
LDL Cholesterol: 48 mg/dL (ref 0–99)
TRIGLYCERIDES: 100 mg/dL (ref <150)
VLDL: 20 mg/dL (ref 0–40)

## 2017-11-01 LAB — I-STAT TROPONIN, ED: Troponin i, poc: 0.11 ng/mL (ref 0.00–0.08)

## 2017-11-01 LAB — POCT ACTIVATED CLOTTING TIME: ACTIVATED CLOTTING TIME: 384 s

## 2017-11-01 LAB — TROPONIN I
TROPONIN I: 0.09 ng/mL — AB (ref <0.03)
TROPONIN I: 1.52 ng/mL — AB (ref <0.03)
TROPONIN I: 17.42 ng/mL — AB (ref <0.03)

## 2017-11-01 LAB — BASIC METABOLIC PANEL
Anion gap: 13 (ref 5–15)
CO2: 19 mmol/L — AB (ref 22–32)
Calcium: 9.5 mg/dL (ref 8.9–10.3)
Chloride: 100 mmol/L — ABNORMAL LOW (ref 101–111)
Creatinine, Ser: 0.87 mg/dL (ref 0.61–1.24)
GFR calc Af Amer: >60 mL/min (ref >60)
GLUCOSE: 193 mg/dL — AB (ref 65–99)
POTASSIUM: 4 mmol/L (ref 3.5–5.1)
Sodium: 132 mmol/L — ABNORMAL LOW (ref 135–145)

## 2017-11-01 LAB — PROTIME-INR
INR: 1.2
Prothrombin Time: 15.1 seconds (ref 11.4–15.2)

## 2017-11-01 LAB — HEMOGLOBIN A1C
HEMOGLOBIN A1C: 6.1 % — AB (ref 4.8–5.6)
Mean Plasma Glucose: 128.37 mg/dL

## 2017-11-01 LAB — SURGICAL PCR SCREEN
MRSA, PCR: POSITIVE — AB
STAPHYLOCOCCUS AUREUS: POSITIVE — AB

## 2017-11-01 LAB — APTT

## 2017-11-01 SURGERY — LEFT HEART CATH AND CORONARY ANGIOGRAPHY
Anesthesia: LOCAL

## 2017-11-01 MED ORDER — LISINOPRIL 10 MG PO TABS
10.0000 mg | ORAL_TABLET | Freq: Every day | ORAL | Status: DC
  Administered 2017-11-01 – 2017-11-02 (×2): 10 mg via ORAL
  Filled 2017-11-01 (×2): qty 1

## 2017-11-01 MED ORDER — TICAGRELOR 90 MG PO TABS: 90.0000 mg | ORAL_TABLET | Freq: Two times a day (BID) | ORAL | Status: DC

## 2017-11-01 MED ORDER — PREGABALIN 50 MG PO CAPS
50.0000 mg | ORAL_CAPSULE | Freq: Two times a day (BID) | ORAL | Status: DC
  Administered 2017-11-01 – 2017-11-04 (×6): 50 mg via ORAL
  Filled 2017-11-01 (×6): qty 1

## 2017-11-01 MED ORDER — PNEUMOCOCCAL VAC POLYVALENT 25 MCG/0.5ML IJ INJ
0.5000 mL | INJECTION | INTRAMUSCULAR | Status: AC | PRN
  Administered 2017-11-03: 0.5 mL via INTRAMUSCULAR
  Filled 2017-11-01: qty 0.5

## 2017-11-01 MED ORDER — NITROGLYCERIN 1 MG/10 ML FOR IR/CATH LAB
INTRA_ARTERIAL | Status: AC
  Filled 2017-11-01: qty 10

## 2017-11-01 MED ORDER — METOPROLOL TARTRATE 12.5 MG HALF TABLET
12.5000 mg | ORAL_TABLET | Freq: Two times a day (BID) | ORAL | Status: DC
Start: 2017-11-01 — End: 2017-11-02
  Administered 2017-11-01 – 2017-11-02 (×3): 12.5 mg via ORAL
  Filled 2017-11-01 (×3): qty 1

## 2017-11-01 MED ORDER — IOPAMIDOL (ISOVUE-370) INJECTION 76%
INTRAVENOUS | Status: AC
  Filled 2017-11-01: qty 125

## 2017-11-01 MED ORDER — HEPARIN SODIUM (PORCINE) 5000 UNIT/ML IJ SOLN
4000.0000 [IU] | Freq: Once | INTRAMUSCULAR | Status: AC
  Administered 2017-11-01: 4000 [IU] via INTRAVENOUS
  Filled 2017-11-01: qty 1

## 2017-11-01 MED ORDER — FENTANYL CITRATE (PF) 100 MCG/2ML IJ SOLN
100.0000 ug | Freq: Once | INTRAMUSCULAR | Status: AC
  Administered 2017-11-01: 100 ug via INTRAVENOUS
  Filled 2017-11-01: qty 2

## 2017-11-01 MED ORDER — HEPARIN (PORCINE) IN NACL 2-0.9 UNIT/ML-% IJ SOLN
INTRAMUSCULAR | Status: AC
  Filled 2017-11-01: qty 1000

## 2017-11-01 MED ORDER — ATORVASTATIN CALCIUM 80 MG PO TABS
80.0000 mg | ORAL_TABLET | Freq: Every day | ORAL | Status: DC
  Administered 2017-11-01 – 2017-11-03 (×3): 80 mg via ORAL
  Filled 2017-11-01 (×3): qty 1

## 2017-11-01 MED ORDER — SODIUM CHLORIDE 0.9 % IV SOLN: INTRAVENOUS | Status: AC

## 2017-11-01 MED ORDER — LIDOCAINE HCL (PF) 1 % IJ SOLN
INTRAMUSCULAR | Status: DC | PRN
  Administered 2017-11-01: 2 mL

## 2017-11-01 MED ORDER — INFLUENZA VAC SPLIT QUAD 0.5 ML IM SUSY
0.5000 mL | PREFILLED_SYRINGE | INTRAMUSCULAR | Status: DC | PRN
Start: 2017-11-03 — End: 2017-11-04

## 2017-11-01 MED ORDER — FENTANYL CITRATE (PF) 100 MCG/2ML IJ SOLN
INTRAMUSCULAR | Status: DC | PRN
  Administered 2017-11-01: 25 ug via INTRAVENOUS

## 2017-11-01 MED ORDER — LIDOCAINE HCL (PF) 1 % IJ SOLN
INTRAMUSCULAR | Status: AC
  Filled 2017-11-01: qty 30

## 2017-11-01 MED ORDER — SODIUM CHLORIDE 0.9% FLUSH: 3.0000 mL | INTRAVENOUS | Status: DC | PRN

## 2017-11-01 MED ORDER — MOMETASONE FURO-FORMOTEROL FUM 200-5 MCG/ACT IN AERO
2.0000 | INHALATION_SPRAY | Freq: Two times a day (BID) | RESPIRATORY_TRACT | Status: DC
  Administered 2017-11-01 – 2017-11-04 (×6): 2 via RESPIRATORY_TRACT
  Filled 2017-11-01 (×2): qty 8.8

## 2017-11-01 MED ORDER — MIDAZOLAM HCL 2 MG/2ML IJ SOLN
INTRAMUSCULAR | Status: AC
  Filled 2017-11-01: qty 2

## 2017-11-01 MED ORDER — ALBUTEROL SULFATE (2.5 MG/3ML) 0.083% IN NEBU: 3.0000 mL | INHALATION_SOLUTION | RESPIRATORY_TRACT | Status: DC | PRN

## 2017-11-01 MED ORDER — TICAGRELOR 90 MG PO TABS
ORAL_TABLET | ORAL | Status: AC
  Filled 2017-11-01: qty 2

## 2017-11-01 MED ORDER — IOPAMIDOL (ISOVUE-370) INJECTION 76%
INTRAVENOUS | Status: DC | PRN
  Administered 2017-11-01: 80 mL

## 2017-11-01 MED ORDER — HEPARIN SODIUM (PORCINE) 1000 UNIT/ML IJ SOLN
INTRAMUSCULAR | Status: DC | PRN
  Administered 2017-11-01: 6000 [IU] via INTRAVENOUS

## 2017-11-01 MED ORDER — FENTANYL CITRATE (PF) 100 MCG/2ML IJ SOLN
INTRAMUSCULAR | Status: AC
  Filled 2017-11-01: qty 2

## 2017-11-01 MED ORDER — TIROFIBAN HCL IV 12.5 MG/250 ML
INTRAVENOUS | Status: AC | PRN
  Administered 2017-11-01: 0.075 ug/kg/min via INTRAVENOUS

## 2017-11-01 MED ORDER — HYDRALAZINE HCL 20 MG/ML IJ SOLN: 5.0000 mg | INTRAMUSCULAR | Status: AC | PRN

## 2017-11-01 MED ORDER — NITROGLYCERIN 1 MG/10 ML FOR IR/CATH LAB
INTRA_ARTERIAL | Status: DC | PRN
  Administered 2017-11-01 (×2): 200 ug via INTRACORONARY

## 2017-11-01 MED ORDER — SODIUM CHLORIDE 0.9% FLUSH
3.0000 mL | Freq: Two times a day (BID) | INTRAVENOUS | Status: DC
  Administered 2017-11-01 – 2017-11-03 (×5): 3 mL via INTRAVENOUS

## 2017-11-01 MED ORDER — SODIUM CHLORIDE 0.9 % IV SOLN: 250.0000 mL | INTRAVENOUS | Status: DC | PRN

## 2017-11-01 MED ORDER — ONDANSETRON HCL 4 MG/2ML IJ SOLN
4.0000 mg | Freq: Four times a day (QID) | INTRAMUSCULAR | Status: DC | PRN
  Administered 2017-11-02: 4 mg via INTRAVENOUS
  Filled 2017-11-01: qty 2

## 2017-11-01 MED ORDER — TICAGRELOR 90 MG PO TABS
90.0000 mg | ORAL_TABLET | Freq: Two times a day (BID) | ORAL | Status: DC
  Administered 2017-11-01 – 2017-11-04 (×6): 90 mg via ORAL
  Filled 2017-11-01 (×6): qty 1

## 2017-11-01 MED ORDER — HEPARIN (PORCINE) IN NACL 2-0.9 UNIT/ML-% IJ SOLN
INTRAMUSCULAR | Status: AC | PRN
  Administered 2017-11-01: 1000 mL

## 2017-11-01 MED ORDER — ASPIRIN 81 MG PO CHEW
81.0000 mg | CHEWABLE_TABLET | Freq: Every day | ORAL | Status: DC
  Administered 2017-11-02 – 2017-11-04 (×3): 81 mg via ORAL
  Filled 2017-11-01 (×3): qty 1

## 2017-11-01 MED ORDER — ACETAMINOPHEN 325 MG PO TABS
650.0000 mg | ORAL_TABLET | ORAL | Status: DC | PRN
  Administered 2017-11-01 (×2): 650 mg via ORAL
  Filled 2017-11-01 (×2): qty 2

## 2017-11-01 MED ORDER — MORPHINE SULFATE (PF) 4 MG/ML IV SOLN
1.0000 mg | INTRAVENOUS | Status: DC | PRN
  Administered 2017-11-01 (×2): 4 mg via INTRAVENOUS
  Filled 2017-11-01: qty 1

## 2017-11-01 MED ORDER — HEPARIN SODIUM (PORCINE) 1000 UNIT/ML IJ SOLN
INTRAMUSCULAR | Status: AC
  Filled 2017-11-01: qty 1

## 2017-11-01 MED ORDER — VERAPAMIL HCL 2.5 MG/ML IV SOLN
INTRAVENOUS | Status: AC
  Filled 2017-11-01: qty 2

## 2017-11-01 MED ORDER — ASPIRIN 81 MG PO CHEW
324.0000 mg | CHEWABLE_TABLET | Freq: Once | ORAL | Status: AC
  Administered 2017-11-01: 324 mg via ORAL
  Filled 2017-11-01: qty 4

## 2017-11-01 MED ORDER — ASPIRIN EC 81 MG PO TBEC: 81.0000 mg | DELAYED_RELEASE_TABLET | Freq: Every day | ORAL | Status: DC

## 2017-11-01 MED ORDER — MIDAZOLAM HCL 2 MG/2ML IJ SOLN
INTRAMUSCULAR | Status: DC | PRN
  Administered 2017-11-01: 1 mg via INTRAVENOUS

## 2017-11-01 MED ORDER — SODIUM CHLORIDE 0.9 % IV SOLN
INTRAVENOUS | Status: DC
  Administered 2017-11-01: 10:00:00 via INTRAVENOUS

## 2017-11-01 MED ORDER — HEPARIN (PORCINE) IN NACL 2-0.9 UNIT/ML-% IJ SOLN
INTRAMUSCULAR | Status: DC | PRN
  Administered 2017-11-01: 10 mL via INTRA_ARTERIAL

## 2017-11-01 MED ORDER — MORPHINE SULFATE (PF) 4 MG/ML IV SOLN
INTRAVENOUS | Status: AC
  Administered 2017-11-01: 4 mg via INTRAVENOUS
  Filled 2017-11-01: qty 1

## 2017-11-01 MED ORDER — TIROFIBAN HCL IN NACL 5-0.9 MG/100ML-% IV SOLN
INTRAVENOUS | Status: AC
  Filled 2017-11-01: qty 100

## 2017-11-01 MED ORDER — TIROFIBAN (AGGRASTAT) BOLUS VIA INFUSION
INTRAVENOUS | Status: DC | PRN
  Administered 2017-11-01: 1815 ug via INTRAVENOUS

## 2017-11-01 MED ORDER — LABETALOL HCL 5 MG/ML IV SOLN: 10.0000 mg | INTRAVENOUS | Status: AC | PRN

## 2017-11-01 SURGICAL SUPPLY — 18 items
BALLN SAPPHIRE 2.5X20 (BALLOONS) ×2
BALLN SAPPHIRE ~~LOC~~ 3.75X18 (BALLOONS) ×2 IMPLANT
BALLOON SAPPHIRE 2.5X20 (BALLOONS) ×1 IMPLANT
CATH EXTRAC PRONTO 5.5F 138CM (CATHETERS) ×2 IMPLANT
CATH INFINITI 5 FR JL3.5 (CATHETERS) ×2 IMPLANT
CATH LAUNCHER 6FR JR4 (CATHETERS) ×2 IMPLANT
DEVICE RAD COMP TR BAND LRG (VASCULAR PRODUCTS) ×2 IMPLANT
GLIDESHEATH SLEND SS 6F .021 (SHEATH) ×2 IMPLANT
GUIDEWIRE INQWIRE 1.5J.035X260 (WIRE) ×1 IMPLANT
INQWIRE 1.5J .035X260CM (WIRE) ×2
KIT ENCORE 26 ADVANTAGE (KITS) ×2 IMPLANT
KIT HEART LEFT (KITS) ×2 IMPLANT
PACK CARDIAC CATHETERIZATION (CUSTOM PROCEDURE TRAY) ×2 IMPLANT
STENT SYNERGY DES 3X38 (Permanent Stent) ×2 IMPLANT
TRANSDUCER W/STOPCOCK (MISCELLANEOUS) ×2 IMPLANT
TUBING CIL FLEX 10 FLL-RA (TUBING) ×2 IMPLANT
VALVE GUARDIAN II ~~LOC~~ HEMO (MISCELLANEOUS) ×2 IMPLANT
WIRE ASAHI PROWATER 180CM (WIRE) ×2 IMPLANT

## 2017-11-01 NOTE — Progress Notes (Signed)
Called to check TR band hematoma. Manual pressure being held on arrival. Slight hematoma present proximal to TR band.  TR band repositioned 1/2 proximal and reinflated to 12cc air. Slight ecchymosis present proximal to tr band. No additional bleeding or hematoma noted. Spo2 98% as measured in right thumb.   Bedrest instructions given.  TR band time restarted at 11:55:00 am

## 2017-11-01 NOTE — ED Notes (Signed)
Cath Lab Team at the bedside taking patient upstairs.

## 2017-11-01 NOTE — ED Notes (Signed)
Xray at the bedside.

## 2017-11-01 NOTE — ED Triage Notes (Signed)
Pt states he has been having chest pain to mid chest since Friday and today feels like a knife stabbing him in his chest and has upper back pain.  Pt states he got out of bed on Friday and fell because he lost his balance.  Pt has small abrasion to forehead.

## 2017-11-01 NOTE — H&P (Signed)
Cardiology Admission History and Physical:   Patient ID: Warren Salas; MRN: [id number]; DOB: August 30, 1961   Admission date: 11/01/2017  Primary Care Provider: Patient, No Pcp Per Primary Cardiologist: New to Dr. Eldridge Dace  Chief Complaint:  Chest pain   Patient Profile:   Warren Salas is a 56 y.o. male with a history of HTN, DM, obesity and tobacco abuse presents for chest pain and found to have a STEMI.  Prior hx of HTN, DM and obesity which resolved with weight loss > 50lb, diet and exercise. Father died of MI at age 48s. Mother had CABG in her 66s. He smokers 1.5 pack a day.   History of Present Illness:   [name] has intermittent chest pain x 4 day. Desribes as sharp central radiating to his upper back. Associated with SOB. This morning he woke up with worse episode 10/10 and persisted leading to ER evaluation. EKG showed inferior lateral ST elevation and code STEMI called. Given ASA, Nitro and heparin bolus and taken to cath lab emergently.    Past Medical History:  Diagnosis Date  . Back pain   . COPD (chronic obstructive pulmonary disease) (HCC)   . Diabetes mellitus without complication (HCC)   . Hypertension     Past Surgical History:  Procedure Laterality Date  . APPENDECTOMY       Medications Prior to Admission: Prior to Admission medications   Medication Sig Start Date End Date Taking? Authorizing Provider  albuterol (PROVENTIL HFA;VENTOLIN HFA) 108 (90 Base) MCG/ACT inhaler Inhale 1-2 puffs into the lungs every 4 (four) hours as needed for wheezing or shortness of breath.  05/07/13   [provider]  aspirin EC 81 MG tablet Take 81 mg by mouth daily. 05/13/15   [provider]  atorvastatin (LIPITOR) 10 MG tablet Take 10 mg by mouth daily. 05/13/15   [provider]  budesonide-formoterol (SYMBICORT) 160-4.5 MCG/ACT inhaler Inhale 2 puffs into the lungs 2 (two) times daily as needed (For shortness of breath.).     [provider]  DULoxetine (CYMBALTA) 30 MG capsule Take 30 mg by mouth 2 (two) times daily.  01/26/16 01/26/17  [provider]  HYDROcodone-acetaminophen (NORCO) 7.5-325 MG tablet Take 1 tablet by mouth every 8 (eight) hours as needed (For pain.).  04/28/16   [provider]  lisinopril (PRINIVIL,ZESTRIL) 10 MG tablet Take 10 mg by mouth daily. 01/26/16   [provider]  metFORMIN (GLUCOPHAGE) 500 MG tablet Take 500 mg by mouth 2 (two) times daily. 01/26/16   [provider]  oxyCODONE-acetaminophen (PERCOCET/ROXICET) 5-325 MG per tablet Take 1-2 tablets by mouth every 4 (four) hours as needed for severe pain. Patient not taking: Reported on 11/01/2017 12/10/14   Elpidio Anis, PA-C  pregabalin (LYRICA) 50 MG capsule Take 50 mg by mouth 2 (two) times daily. 05/07/13   [provider]     Allergies:   No Known Allergies  Social History:   Social History   Socioeconomic History  . Marital status: Divorced    Spouse name: Not on file  . Number of children: Not on file  . Years of education: Not on file  . Highest education level: Not on file  Social Needs  . Financial resource strain: Not on file  . Food insecurity - worry: Not on file  . Food insecurity - inability: Not on file  . Transportation needs - medical: Not on file  . Transportation needs - non-medical: Not on file  Occupational History  .  Not on file  Tobacco Use  . Smoking status: Current Every Day Smoker  . Smokeless tobacco: Never Used  Substance and Sexual Activity  . Alcohol use: Yes  . Drug use: Yes    Comment: heroin   . Sexual activity: No  Other Topics Concern  . Not on file  Social History Narrative  . Not on file    Family History:   The patient's family history includes Diabetes in his father and mother; Hypertension in his father and mother.    ROS:  Please see the history of present illness.  All other ROS reviewed and negative.     Physical Exam/Data:    Vitals:   11/01/17 1200 11/01/17 1215 11/01/17 1230 11/01/17 1300  BP: 138/80 131/77 (!) 141/88 (!) 130/93  Pulse: 90 71 86 91  Resp: (!) 22 20 19 17   Temp:      TempSrc:      SpO2: 98% 98% 98% 97%  Weight:      Height:        Intake/Output Summary (Last 24 hours) at 11/01/2017 1407 Last data filed at 11/01/2017 1200 Gross per 24 hour  Intake 70.87 ml  Output -  Net 70.87 ml   Filed Weights   11/01/17 1007  Weight: 160 lb (72.6 kg)   Body mass index is 25.82 kg/m.  General:  Well nourished, well developed, in no acute distress HEENT: normal Lymph: no adenopathy Neck: no JVD Endocrine:  No thryomegaly Vascular: No carotid bruits; FA pulses 2+ bilaterally without bruits  Cardiac:  normal S1, S2; RRR; no murmur. Right radial cath site with TR band Lungs:  clear to auscultation bilaterally, no wheezing, rhonchi or rales  Abd: soft, nontender, no hepatomegaly  Ext: no edema Musculoskeletal:  No deformities, BUE and BLE strength normal and equal Skin: warm and dry  Neuro:  CNs 2-12 intact, no focal abnormalities noted Psych:  Normal affect    EKG:  The ECG that was done  was personally reviewed and demonstrates Inferior lateral ST elevation   Relevant CV Studies: Cath 11/01/17 LEFT HEART CATH AND CORONARY ANGIOGRAPHY  Conclusion     Mid RCA lesion is 100% stenosed. Treated with thrombectomy and stent placement.  A drug-eluting stent was successfully placed using a STENT SYNERGY DES [id number], postdilated to 3.8 mm.  Post intervention, there is a 0% residual stenosis.  Mid LAD-1 lesion is 75% stenosed.  Mid LAD-2 lesion is 75% stenosed.  Mid Cx lesion is 25% stenosed.  There is mild left ventricular systolic dysfunction.  LV end diastolic pressure is normal.  The left ventricular ejection fraction is 45-50% by visual estimate.  There is no aortic valve stenosis.  Post intervention, there is a 0% residual stenosis.   Continue IV tirofiban for 2 hours.   Large burden of thrombus in a large, dominant RCA.  He will need dual antiplatelet therapy for at least a year,   Along with aggressive secondary prevention including diabetes control,, lipid management and smoking cessation.    Will consider PCI of the LAD after he has recovered from the inferior MI.        Laboratory Data:  Chemistry Recent Labs  Lab 11/01/17 0945 11/01/17 1019  NA 132* 133*  K 4.0 4.1  CL 100* 99*  CO2 19*  --   GLUCOSE 193* 195*  BUN <5* <3*  CREATININE 0.87 0.70  CALCIUM 9.5  --   GFRNONAA >60  --   GFRAA >60  --  ANIONGAP 13  --     No results for input(s): PROT, ALBUMIN, AST, ALT, ALKPHOS, BILITOT in the last 168 hours. Hematology Recent Labs  Lab 11/01/17 0945 11/01/17 1019  WBC 5.6  --   RBC 4.85  --   HGB 16.5 16.3  HCT 47.3 48.0  MCV 97.5  --   MCH 34.0  --   MCHC 34.9  --   RDW 14.2  --   PLT 202  --    Cardiac Enzymes Recent Labs  Lab 11/01/17 1015 11/01/17 1140  TROPONINI 0.09* 1.52*    Recent Labs  Lab 11/01/17 1002  TROPIPOC 0.11*    BNPNo results for input(s): BNP, PROBNP in the last 168 hours.  DDimer No results for input(s): DDIMER in the last 168 hours.  Radiology/Studies:  Dg Chest Portable 1 View  Result Date: 11/01/2017 CLINICAL DATA:  Mid chest pain for several days, upper back pain, recent fall EXAM: PORTABLE CHEST 1 VIEW COMPARISON:  Chest x-ray of 11/11/2008 FINDINGS: The lungs appear clear. Mediastinal and hilar contours are unremarkable. The heart is within normal limits in size. No bony abnormality is seen. IMPRESSION: No active disease. Electronically Signed   By: Dwyane DeePaul  Barry M.D.   On: 11/01/2017 10:38    Assessment and Plan:   1. STEMI - S/p Thrombectomy and DES to RCA. 75% stenosed mLAD x 2. LVEF of 45-50% by cath. Chest pain resolved.  - Continue ASA, Brillinta, statin, BB and ACE.   2. HLD - 11/01/2017: Cholesterol 145; HDL 77; LDL Cholesterol 48; Triglycerides 100; VLDL 20  - continue  statin  3. DM - A1c of 6.1  4. HTN - BP improving  5. Tobacco abuse - Cessation advised. Education given.   Severity of Illness: The appropriate patient status for this patient is INPATIENT. Inpatient status is judged to be reasonable and necessary in order to provide the required intensity of service to ensure the patient's safety. The patient's presenting symptoms, physical exam findings, and initial radiographic and laboratory data in the context of their chronic comorbidities is felt to place them at high risk for further clinical deterioration. Furthermore, it is not anticipated that the patient will be medically stable for discharge from the hospital within 2 midnights of admission. The following factors support the patient status of inpatient.   " The patient's presenting symptoms include Chest pain  " The worrisome physical exam findings include N/A " The initial radiographic and laboratory data are worrisome because of STEMI " The chronic co-morbidities include HTN and DM   * I certify that at the point of admission it is my clinical judgment that the patient will require inpatient hospital care spanning beyond 2 midnights from the point of admission due to high intensity of service, high risk for further deterioration and high frequency of surveillance required.*    For questions or updates, please contact [facility] Please consult [email or web address] for contact info under Cardiology/STEMI.    Lorelei PontSigned, Bhagat,Bhavinkumar, PA  11/01/2017 2:07 PM   I have examined the patient and reviewed assessment and plan and discussed with patient.  Agree with above as stated. RRR, S1S2, 2+ right radial pulse.  No lE edema.  Acute inferior MI.  Cath showed occluded RCA.  LAD disease noted as well.  S/p RCA stent.  Inferior hypokinesis.  COntinue aggressive secondary prevention.  He needs to get back on his meds and stop his drug abuse.  He will be watched in house for a few days.  Lance MussJayadeep  Justice Milliron

## 2017-11-01 NOTE — ED Notes (Signed)
Belongings sent with patient's mother to cath waiting room

## 2017-11-01 NOTE — ED Notes (Signed)
Pt undressed and placed on zoll

## 2017-11-01 NOTE — ED Provider Notes (Signed)
[facility] Provider Note   CSN: [id number] Arrival date & time: 11/01/17  [id number]     History   Chief Complaint Chief Complaint  Patient presents with  . Chest Pain  . Back Pain    HPI Warren Salas is a 56 y.o. male.  56yo M w/ PMH HTN, COPD, T2DM not on medications who p/w chest pain.  4 days ago, the patient began having a central chest pain that he describes as severe and occasionally sharp.  It does radiate to his back.  The pain was initially intermittent, became severe the next day and he almost came to the ED but then it waned and he stayed at home.  The pain became worse this morning which is what prompted him to come in.  He reports some associated diaphoresis, no nausea or vomiting.  He has shortness of breath with exertion.  He has never had pain like this before.  He denies any history of cardiac disease or cocaine use.  No problems with bleeding.  He smokes 1.5 PPD. FH notable for mother with CAD, father died of MI at age 56.    The history is provided by the patient.  Chest Pain   Associated symptoms include back pain.  Back Pain   Associated symptoms include chest pain.    Past Medical History:  Diagnosis Date  . Back pain   . COPD (chronic obstructive pulmonary disease) (HCC)   . Diabetes mellitus without complication (HCC)   . Hypertension     There are no active problems to display for this patient.   Past Surgical History:  Procedure Laterality Date  . APPENDECTOMY         Home Medications    Prior to Admission medications   Medication Sig Start Date End Date Taking? Authorizing Provider  albuterol (PROVENTIL HFA;VENTOLIN HFA) 108 (90 Base) MCG/ACT inhaler Inhale 1-2 puffs into the lungs every 4 (four) hours as needed for wheezing or shortness of breath.  05/07/13   [provider]  aspirin EC 81 MG tablet Take 81 mg by mouth daily. 05/13/15   [provider]  atorvastatin (LIPITOR) 10 MG  tablet Take 10 mg by mouth daily. 05/13/15   [provider]  budesonide-formoterol (SYMBICORT) 160-4.5 MCG/ACT inhaler Inhale 2 puffs into the lungs 2 (two) times daily as needed (For shortness of breath.).     [provider]  DULoxetine (CYMBALTA) 30 MG capsule Take 30 mg by mouth 2 (two) times daily.  01/26/16 01/26/17  [provider]  HYDROcodone-acetaminophen (NORCO) 7.5-325 MG tablet Take 1 tablet by mouth every 8 (eight) hours as needed (For pain.).  04/28/16   [provider]  lisinopril (PRINIVIL,ZESTRIL) 10 MG tablet Take 10 mg by mouth daily. 01/26/16   [provider]  metFORMIN (GLUCOPHAGE) 500 MG tablet Take 500 mg by mouth 2 (two) times daily. 01/26/16   [provider]  oxyCODONE-acetaminophen (PERCOCET/ROXICET) 5-325 MG per tablet Take 1-2 tablets by mouth every 4 (four) hours as needed for severe pain. 12/10/14   Elpidio AnisUpstill, Shari, PA-C  pregabalin (LYRICA) 50 MG capsule Take 50 mg by mouth 2 (two) times daily. 05/07/13   [provider]    Family History Family History  Problem Relation Age of Onset  . Hypertension Mother   . Diabetes Mother   . Hypertension Father   . Diabetes Father     Social History Social History   Tobacco Use  . Smoking status:  Current Every Day Smoker  . Smokeless tobacco: Never Used  Substance Use Topics  . Alcohol use: Yes  . Drug use: Yes    Comment: heroin      Allergies   Patient has no known allergies.   Review of Systems Review of Systems  Cardiovascular: Positive for chest pain.  Musculoskeletal: Positive for back pain.   All other systems reviewed and are negative except that which was mentioned in HPI   Physical Exam Updated Vital Signs BP (!) 166/114 (BP Location: Left Arm)   Pulse (!) 120   Temp 97.6 F (36.4 C) (Oral)   Resp (!) 22   Ht 5\' 6"  (1.676 m)   Wt 72.6 kg (160 lb)   SpO2 98%   BMI 25.82 kg/m   Physical Exam  Constitutional: He is oriented to  person, place, and time. He appears well-developed and well-nourished. He appears distressed.  HENT:  Head: Normocephalic.  Moist mucous membranes  Eyes: Conjunctivae are normal. Pupils are equal, round, and reactive to light.  Neck: Neck supple.  Cardiovascular: Normal rate, regular rhythm and normal heart sounds.  No murmur heard. Pulmonary/Chest: Effort normal and breath sounds normal.  Abdominal: Soft. Bowel sounds are normal. He exhibits no distension. There is no tenderness.  Musculoskeletal: He exhibits no edema.  Neurological: He is alert and oriented to person, place, and time.  Fluent speech  Skin: Skin is warm. He is diaphoretic.  Abrasion R forehead  Psychiatric: He has a normal mood and affect. Judgment normal.  Nursing note and vitals reviewed.    ED Treatments / Results  Labs (all labs ordered are listed, but only abnormal results are displayed) Labs Reviewed  BASIC METABOLIC PANEL  CBC  PROTIME-INR  APTT  TROPONIN I  LIPID PANEL  I-STAT TROPONIN, ED    EKG  EKG Interpretation None       Radiology No results found.  Procedures .Critical Care Performed by: Laurence SpatesLittle, Valeria Boza Morgan, MD Authorized by: Laurence SpatesLittle, Emry Tobin Morgan, MD   Critical care provider statement:    Critical care time (minutes):  30   Critical care time was exclusive of:  Separately billable procedures and treating other patients   Critical care was necessary to treat or prevent imminent or life-threatening deterioration of the following conditions:  Cardiac failure   Critical care was time spent personally by me on the following activities:  Development of treatment plan with patient or surrogate, discussions with consultants, examination of patient, obtaining history from patient or surrogate, ordering and performing treatments and interventions, ordering and review of laboratory studies, ordering and review of radiographic studies and review of old charts   (including critical care  time)  Medications Ordered in ED Medications  0.9 %  sodium chloride infusion (not administered)  heparin injection 60 Units/kg (not administered)  fentaNYL (SUBLIMAZE) injection 100 mcg (not administered)  aspirin chewable tablet 324 mg (324 mg Oral Given 11/01/17 1005)     Initial Impression / Assessment and Plan / ED Course  I have reviewed the triage vital signs and the nursing notes.  Pertinent labs that were available during my care of the patient were reviewed by me and considered in my medical decision making (see chart for details).    PT w/ 4 days of chest pain, initially intermittent and now constant.  EKG on arrival showed acute STEMI. Immediately called STEMI alert. Gave aspirin, NTG. No recent problems with bleeding, he had abrasion on head from hitting head a few days  ago but did not lose consciousness and has not had any symptoms concerning for intracranial injury.  Therefore gave heparin bolus.  Labs notable for initial troponin of 0.1.  I discussed his case with cardiologist, Dr. Eldridge Dace, who will take to cath lab.  Final Clinical Impressions(s) / ED Diagnoses   Final diagnoses:  None    ED Discharge Orders    None       Myrl Lazarus, Ambrose Finland, MD 11/01/17 1034

## 2017-11-01 NOTE — ED Notes (Signed)
Pt being transported to cath lab by chelsea, rn and EDT brihanna

## 2017-11-01 NOTE — ED Notes (Signed)
Cath lab RN Marchelle Folksmanda at bedside

## 2017-11-02 ENCOUNTER — Inpatient Hospital Stay (HOSPITAL_COMMUNITY): Payer: Medicare Other

## 2017-11-02 DIAGNOSIS — E785 Hyperlipidemia, unspecified: Secondary | ICD-10-CM

## 2017-11-02 DIAGNOSIS — I2119 ST elevation (STEMI) myocardial infarction involving other coronary artery of inferior wall: Secondary | ICD-10-CM

## 2017-11-02 LAB — TROPONIN I: TROPONIN I: 19.16 ng/mL — AB (ref <0.03)

## 2017-11-02 LAB — ECHOCARDIOGRAM COMPLETE
Ao-asc: 29 cm
CHL CUP DOP CALC LVOT VTI: 22.5 cm
EERAT: 9.67
FS: 33 % (ref 28–44)
Height: 66 in
IV/PV OW: 1
LA vol A4C: 30.6 ml
LA vol index: 18.7 mL/m2
LA vol: 34 mL
LADIAMINDEX: 1.32 cm/m2
LASIZE: 24 mm
LEFT ATRIUM END SYS DIAM: 24 mm
LV PW d: 11 mm — AB (ref 0.6–1.1)
LV SIMPSON'S DISK: 63
LV TDI E'LATERAL: 9.68
LV TDI E'MEDIAL: 8.05
LV dias vol index: 29 mL/m2
LV dias vol: 53 mL — AB (ref 62–150)
LV sys vol index: 11 mL/m2
LV sys vol: 20 mL — AB (ref 21–61)
LVEEAVG: 9.67
LVEEMED: 9.67
LVELAT: 9.68 cm/s
LVOT area: 2.01 cm2
LVOT peak grad rest: 5 mmHg
LVOT peak vel: 117 cm/s
LVOTD: 16 mm
LVOTSV: 45 mL
MVPG: 4 mmHg
MVPKAVEL: 98 m/s
MVPKEVEL: 93.6 m/s
RV LATERAL S' VELOCITY: 11.8 cm/s
Stroke v: 33 ml
TAPSE: 14.1 mm
Weight: 2560 oz

## 2017-11-02 LAB — BASIC METABOLIC PANEL
ANION GAP: 7 (ref 5–15)
BUN: <5 mg/dL — ABNORMAL LOW (ref 6–20)
CALCIUM: 8.7 mg/dL — AB (ref 8.9–10.3)
CO2: 23 mmol/L (ref 22–32)
CREATININE: 0.62 mg/dL (ref 0.61–1.24)
Chloride: 102 mmol/L (ref 101–111)
Glucose, Bld: 90 mg/dL (ref 65–99)
Potassium: 4.1 mmol/L (ref 3.5–5.1)
SODIUM: 132 mmol/L — AB (ref 135–145)

## 2017-11-02 LAB — CBC
HCT: 38.5 % — ABNORMAL LOW (ref 39.0–52.0)
HEMOGLOBIN: 12.9 g/dL — AB (ref 13.0–17.0)
MCH: 33.1 pg (ref 26.0–34.0)
MCHC: 33.5 g/dL (ref 30.0–36.0)
MCV: 98.7 fL (ref 78.0–100.0)
PLATELETS: 150 10*3/uL (ref 150–400)
RBC: 3.9 MIL/uL — AB (ref 4.22–5.81)
RDW: 14 % (ref 11.5–15.5)
WBC: 7 10*3/uL (ref 4.0–10.5)

## 2017-11-02 LAB — CK TOTAL AND CKMB (NOT AT ARMC)
CK TOTAL: 1244 U/L — AB (ref 49–397)
CK, MB: 261.4 ng/mL — AB (ref 0.5–5.0)
Relative Index: 21 — ABNORMAL HIGH (ref 0.0–2.5)

## 2017-11-02 MED ORDER — MUPIROCIN 2 % EX OINT: 1.0000 "application " | TOPICAL_OINTMENT | Freq: Two times a day (BID) | CUTANEOUS | Status: DC

## 2017-11-02 MED ORDER — MUPIROCIN 2 % EX OINT
1.0000 "application " | TOPICAL_OINTMENT | Freq: Two times a day (BID) | CUTANEOUS | Status: DC
  Administered 2017-11-02 – 2017-11-04 (×4): 1 via NASAL
  Filled 2017-11-02 (×3): qty 22

## 2017-11-02 MED ORDER — METOPROLOL TARTRATE 25 MG PO TABS
25.0000 mg | ORAL_TABLET | Freq: Two times a day (BID) | ORAL | Status: DC
  Administered 2017-11-02: 25 mg via ORAL
  Filled 2017-11-02 (×2): qty 1

## 2017-11-02 MED ORDER — ALPRAZOLAM 0.5 MG PO TABS
0.5000 mg | ORAL_TABLET | Freq: Once | ORAL | Status: AC
  Administered 2017-11-02: 0.5 mg via ORAL
  Filled 2017-11-02: qty 1

## 2017-11-02 MED ORDER — LISINOPRIL 5 MG PO TABS
5.0000 mg | ORAL_TABLET | Freq: Every day | ORAL | Status: DC
  Administered 2017-11-03 – 2017-11-04 (×2): 5 mg via ORAL
  Filled 2017-11-02 (×2): qty 1

## 2017-11-02 MED ORDER — CHLORHEXIDINE GLUCONATE CLOTH 2 % EX PADS
6.0000 | MEDICATED_PAD | Freq: Every day | CUTANEOUS | Status: DC
  Administered 2017-11-02 – 2017-11-04 (×3): 6 via TOPICAL

## 2017-11-02 MED ORDER — CHLORHEXIDINE GLUCONATE CLOTH 2 % EX PADS: 6.0000 | MEDICATED_PAD | Freq: Every day | CUTANEOUS | Status: DC

## 2017-11-02 NOTE — Progress Notes (Signed)
  Echocardiogram 2D Echocardiogram has been performed.  Janalyn HarderWest, Ted Goodner R 11/02/2017, 1:49 PM

## 2017-11-02 NOTE — Care Management Note (Signed)
Case Management Note Donn PieriniKristi Jailen Coward RN, BSN Unit 4E-Case Manager-- 2H coverage [phone]  Patient Details  Name: Warren ScarletBarton Bernards MRN: [id number] Date of Birth: 1961/10/15  Subjective/Objective:  Pt admitted with STEMI s/p DES to RCA                  Action/Plan: PTA pt lived at home , independent, referral for Brilinta needs received- per insurance check- copay cost $3.70, spoke with pt at bedside - coverage info shared and 30 day free card provided to pt to use on discharge. Pharmacy is [facility]- Pt has not had a PCP in a while per pt- will provide pt [facility] # for referral assistance.   Expected Discharge Date:   11/03/17               Expected Discharge Plan:  Home/Self Care  In-House Referral:  NA  Discharge planning Services  CM Consult, Medication Assistance  Post Acute Care Choice:  NA Choice offered to:  NA  DME Arranged:    DME Agency:     HH Arranged:    HH Agency:     Status of Service:  Completed, signed off  If discussed at Long Length of Stay Meetings, dates discussed:    Discharge Disposition:  Home/self care   Additional Comments:  Darrold SpanWebster, Qunicy Higinbotham Hall, RN 11/02/2017, 10:55 AM

## 2017-11-02 NOTE — Progress Notes (Signed)
Progress Note  Patient Name: Warren Salas Date of Encounter: 11/02/2017  Primary Cardiologist: Dr. Eldridge DaceVaranasi  Subjective   Says he is feeling great today, has complete resolution of chest pain after the cath yesterday.   Inpatient Medications    Scheduled Meds: . aspirin  81 mg Oral Daily  . atorvastatin  80 mg Oral q1800  . lisinopril  10 mg Oral Daily  . metoprolol tartrate  12.5 mg Oral BID  . mometasone-formoterol  2 puff Inhalation BID  . pregabalin  50 mg Oral BID  . sodium chloride flush  3 mL Intravenous Q12H  . ticagrelor  90 mg Oral BID   Continuous Infusions: . sodium chloride 20 mL/hr at 11/02/17 0600  . sodium chloride     PRN Meds: sodium chloride, acetaminophen, albuterol, [START ON 11/03/2017] Influenza vac split quadrivalent PF, morphine injection, ondansetron (ZOFRAN) IV, [START ON 11/03/2017] pneumococcal 23 valent vaccine, sodium chloride flush   Vital Signs    Vitals:   11/02/17 0400 11/02/17 0500 11/02/17 0600 11/02/17 0700  BP: (!) 93/52 122/85 98/63 100/62  Pulse: (!) 58 77 62 (!) 57  Resp: 17 (!) 24 11 10   Temp:      TempSrc:      SpO2: 99% 100% 99% 99%  Weight:      Height:        Intake/Output Summary (Last 24 hours) at 11/02/2017 0800 Last data filed at 11/02/2017 0600 Gross per 24 hour  Intake 830.87 ml  Output 800 ml  Net 30.87 ml   Filed Weights   11/01/17 1007  Weight: 160 lb (72.6 kg)    Telemetry    Multiple PVCs - some in pairs up to 6 back to back, at least two episodes of vtach up to 12 runs - Personally Reviewed  ECG    Sinus rhythm, ST segment elevations in inferior leads with reciprocal depressions in anterior and septal- Personally Reviewed  Physical Exam   GEN: Well appearing, sitting up in bed speaking with mother, No acute distress.   Neck: No JVD Cardiac: RRR, no murmurs, rubs, or gallops.  Respiratory: Clear to auscultation bilaterally. GI: Soft, nontender, non-distended  MS: No edema; No  deformity. Neuro:  Nonfocal  Psych: Normal affect   Labs    Chemistry Recent Labs  Lab 11/01/17 0945 11/01/17 1019 11/02/17 0423  NA 132* 133* 132*  K 4.0 4.1 4.1  CL 100* 99* 102  CO2 19*  --  23  GLUCOSE 193* 195* 90  BUN <5* <3* <5*  CREATININE 0.87 0.70 0.62  CALCIUM 9.5  --  8.7*  GFRNONAA >60  --  >60  GFRAA >60  --  >60  ANIONGAP 13  --  7     Hematology Recent Labs  Lab 11/01/17 0945 11/01/17 1019 11/02/17 0423  WBC 5.6  --  7.0  RBC 4.85  --  3.90*  HGB 16.5 16.3 12.9*  HCT 47.3 48.0 38.5*  MCV 97.5  --  98.7  MCH 34.0  --  33.1  MCHC 34.9  --  33.5  RDW 14.2  --  14.0  PLT 202  --  150    Cardiac Enzymes Recent Labs  Lab 11/01/17 1015 11/01/17 1140 11/01/17 2031 11/01/17 2321  TROPONINI 0.09* 1.52* 17.42* 19.16*    Recent Labs  Lab 11/01/17 1002  TROPIPOC 0.11*     BNPNo results for input(s): BNP, PROBNP in the last 168 hours.   DDimer No results for input(s): DDIMER  in the last 168 hours.   Radiology    Dg Chest Portable 1 View  Result Date: 11/01/2017 CLINICAL DATA:  Mid chest pain for several days, upper back pain, recent fall EXAM: PORTABLE CHEST 1 VIEW COMPARISON:  Chest x-ray of 11/11/2008 FINDINGS: The lungs appear clear. Mediastinal and hilar contours are unremarkable. The heart is within normal limits in size. No bony abnormality is seen. IMPRESSION: No active disease. Electronically Signed   By: Dwyane DeePaul  Barry M.D.   On: 11/01/2017 10:38    Cardiac Studies   Cath 11/6   Mid RCA lesion is 100% stenosed. Treated with thrombectomy and stent placement.  A drug-eluting stent was successfully placed using a STENT SYNERGY DES 3X38, postdilated to 3.8 mm.  Post intervention, there is a 0% residual stenosis.  Mid LAD-1 lesion is 75% stenosed.  Mid LAD-2 lesion is 75% stenosed.  Mid Cx lesion is 25% stenosed.  There is mild left ventricular systolic dysfunction.  LV end diastolic pressure is normal.  The left ventricular  ejection fraction is 45-50% by visual estimate.  There is no aortic valve stenosis.  Post intervention, there is a 0% residual stenosis.   Continue IV tirofiban for 2 hours.  Large burden of thrombus in a large, dominant RCA.  He will need dual antiplatelet therapy for at least a year,   Along with aggressive secondary prevention including diabetes control,, lipid management and smoking cessation.    Will consider PCI of the LAD after he has recovered from the inferior MI.    Patient Profile     56 y.o. male with PMH family history of coronary artery disease ( father died from MI in 7860s, mother with CABG in 7080s ) tobacco use disorder, hypertension, controlled type 2 diabetes mellitus, hyperlipidemia, and depression. He presented with 4 days of intermittent chest pain. The chest pain is sharp, radiates to his upper back and is associated with shortness of breath. In the ED 11/6 EKG showed inferior lateral ST elevation, he was given aspirin, nitro and heparin and taken to the cath lab emergently. In the cath lab he was found to have 100% stenosis of the RCA which was treated with thrombectomy and DES. There was also 75% stenosis of the LAD and 25% stenosis of the circumflex.   Assessment & Plan    Inferior STEMI s/p DES to RCA - EKG today with residual ST elevations however patient reports complete resolution of chest pain after PCI. Medical management is with aspirin, brilinta, atorvastatin 80 mg qd, metoprolol tartrate 12.5 mg BID, and Lisinopril 10 mg daily. Heart rate is in the 502-70s so this may be the maximal beta blockade that he can tolerate. May need PCI of the LAD after recovery from this  inferior MI. Will need at least one year of DAPT.   Vtach - Multiple episodes of PVCs and vtach overnight with runs up to 12 beats, patient does not report symptoms related to this. Will need to monitor closely today.   Tobacco use disorder - continued encouragement of cessation   Hypertension -  blood pressure 90- 120s/ 50-80s on lisinopril 10 mg daily   Type 2 diabetes - A1c is 6.1, this was with metformin alone outpatient, will continue to follow CBGs while inpatient   Hyperlipidemia - Atorvastatin increased to 80 mg daily ( had been taking 10 mg), LDL is 48   For questions or updates, please contact [facility] Please consult [email or web address] for contact info under Cardiology/STEMI.  Signed, Eulah Pont, MD  11/02/2017, 8:00 AM     Patient seen and examined. Agree with assessment and plan. Day 1 s/p high risk inferior STEMI with inferolateral  STE and precordial ST depression.  Evolving MI changes on ECG today with resolution of lateral ST changes and precordial ST depression.  Had several bursts of NSVT last night;  AS BP allows will increase metoprolol to 25 mg bid with tonights dose; consider decreasing lisinopril to 5 mg.  Now on ASA/brilinta and atorva 80 mg. Will keep in CCU today and transfer to telemetry tomorrow. Discussed smoking cessation.   Lennette Bihari, MD, [facility] 11/02/2017 11:31 AM

## 2017-11-02 NOTE — Progress Notes (Signed)
CARDIAC REHAB PHASE I   PRE:  Rate/Rhythm: 71 SR  BP:  Supine:   Sitting: 110/67  Standing:    SaO2: 100%RA  MODE:  Ambulation: 370 ft   POST:  Rate/Rhythm: 88 SR  BP:  Supine:   Sitting: 143/67  Standing:    SaO2: 100%RA 1020-1125 Pt walked 370 ft with steady gait. Stopped once to rest his back. No CP. To recliner after walk. MI education completed with pt and his mother. Pt voiced understanding. Stressed importance of brilinta with stent. Reviewed NTG use, heart healthy and low carb food choices, smoking cessation, MI restrictions, CRP 2. Did not give ex ed as pt is limited by his back. Encouraged walking as tolerated. Gave pt fake cigarette and smoking cessation handout. He stated he is going to try nicotine gum. Encouraged him to call [phone] as needed. Will refer to [place] CRP 2.   Luetta Nuttingharlene Nahuel Wilbert, RN BSN  11/02/2017 11:23 AM

## 2017-11-02 NOTE — Progress Notes (Signed)
Per benefits check on Brilinta  # 9. S/W CYRSTAL @ [employer] [facility] # [phone]   BRILINTA 90 MG BID   COVER- YES  CO-PAY- $ 3.70  TIER- 3 DRUG  PRIOR APPROVAL- NO  NO DEDUCTIBLE   [facility]

## 2017-11-03 ENCOUNTER — Encounter (HOSPITAL_COMMUNITY): Payer: Self-pay

## 2017-11-03 DIAGNOSIS — E7800 Pure hypercholesterolemia, unspecified: Secondary | ICD-10-CM

## 2017-11-03 MED ORDER — METOPROLOL TARTRATE 25 MG PO TABS
37.5000 mg | ORAL_TABLET | Freq: Two times a day (BID) | ORAL | Status: DC
  Administered 2017-11-03 (×2): 37.5 mg via ORAL
  Filled 2017-11-03: qty 1

## 2017-11-03 MED ORDER — ISOSORBIDE MONONITRATE ER 30 MG PO TB24
30.0000 mg | ORAL_TABLET | Freq: Every day | ORAL | Status: DC
  Administered 2017-11-03 – 2017-11-04 (×2): 30 mg via ORAL
  Filled 2017-11-03 (×2): qty 1

## 2017-11-03 MED ORDER — GUAIFENESIN-DM 100-10 MG/5ML PO SYRP
15.0000 mL | ORAL_SOLUTION | ORAL | Status: DC | PRN
  Administered 2017-11-03: 15 mL via ORAL
  Filled 2017-11-03: qty 15

## 2017-11-03 NOTE — Progress Notes (Signed)
Progress Note  Patient Name: Warren Salas Date of Encounter: 11/03/2017  Primary Cardiologist: new to Dr. Eldridge DaceVaranasi   Subjective   Feeling well today, no new concerns or complaints   Inpatient Medications    Scheduled Meds: . aspirin  81 mg Oral Daily  . atorvastatin  80 mg Oral q1800  . Chlorhexidine Gluconate Cloth  6 each Topical Q0600  . lisinopril  5 mg Oral Daily  . metoprolol tartrate  25 mg Oral BID  . mometasone-formoterol  2 puff Inhalation BID  . mupirocin ointment  1 application Nasal BID  . pregabalin  50 mg Oral BID  . sodium chloride flush  3 mL Intravenous Q12H  . ticagrelor  90 mg Oral BID   Continuous Infusions: . sodium chloride Stopped (11/02/17 1200)  . sodium chloride     PRN Meds: sodium chloride, acetaminophen, albuterol, Influenza vac split quadrivalent PF, morphine injection, ondansetron (ZOFRAN) IV, pneumococcal 23 valent vaccine, sodium chloride flush   Vital Signs    Vitals:   11/03/17 0200 11/03/17 0301 11/03/17 0400 11/03/17 0500  BP: (!) 105/55 101/67 121/86 123/75  Pulse: 62 64 79 60  Resp: (!) 8 15 17 14   Temp:  98.4 F (36.9 C)    TempSrc:  Oral    SpO2: 100% 97% 99% 99%  Weight:      Height:        Intake/Output Summary (Last 24 hours) at 11/03/2017 0634 Last data filed at 11/03/2017 [id number] Gross per 24 hour  Intake 660 ml  Output 2925 ml  Net -2265 ml   Filed Weights   11/01/17 1007  Weight: 160 lb (72.6 kg)    Telemetry    Infrequent PVCS, sometimes PVCs in pairs - Personally Reviewed  ECG    T wave inversions with mini q wave in inferior leads - Personally Reviewed  Physical Exam   GEN: sleeping comfortably, No acute distress.   Neck: No JVD Cardiac: RRR, no murmurs, rubs, or gallops.  Respiratory: Clear to auscultation bilaterally. GI: Soft, nontender, non-distended  MS: No edema; No deformity. Neuro:  Nonfocal  Psych: Normal affect   Labs    Chemistry Recent Labs  Lab 11/01/17 0945  11/01/17 1019 11/02/17 0423  NA 132* 133* 132*  K 4.0 4.1 4.1  CL 100* 99* 102  CO2 19*  --  23  GLUCOSE 193* 195* 90  BUN <5* <3* <5*  CREATININE 0.87 0.70 0.62  CALCIUM 9.5  --  8.7*  GFRNONAA >60  --  >60  GFRAA >60  --  >60  ANIONGAP 13  --  7     Hematology Recent Labs  Lab 11/01/17 0945 11/01/17 1019 11/02/17 0423  WBC 5.6  --  7.0  RBC 4.85  --  3.90*  HGB 16.5 16.3 12.9*  HCT 47.3 48.0 38.5*  MCV 97.5  --  98.7  MCH 34.0  --  33.1  MCHC 34.9  --  33.5  RDW 14.2  --  14.0  PLT 202  --  150    Cardiac Enzymes Recent Labs  Lab 11/01/17 1015 11/01/17 1140 11/01/17 2031 11/01/17 2321  TROPONINI 0.09* 1.52* 17.42* 19.16*    Recent Labs  Lab 11/01/17 1002  TROPIPOC 0.11*    Lipid Panel     Component Value Date/Time   CHOL 145 11/01/2017 1015   TRIG 100 11/01/2017 1015   HDL 77 11/01/2017 1015   CHOLHDL 1.9 11/01/2017 1015   VLDL 20 11/01/2017 1015  LDLCALC 48 11/01/2017 1015    BNPNo results for input(s): BNP, PROBNP in the last 168 hours.   DDimer No results for input(s): DDIMER in the last 168 hours.   Radiology    Dg Chest Portable 1 View  Result Date: 11/01/2017 CLINICAL DATA:  Mid chest pain for several days, upper back pain, recent fall EXAM: PORTABLE CHEST 1 VIEW COMPARISON:  Chest x-ray of 11/11/2008 FINDINGS: The lungs appear clear. Mediastinal and hilar contours are unremarkable. The heart is within normal limits in size. No bony abnormality is seen. IMPRESSION: No active disease. Electronically Signed   By: Dwyane Dee M.D.   On: 11/01/2017 10:38    Cardiac Studies   Cath 11/6   Mid RCA lesion is 100% stenosed. Treated with thrombectomy and stent placement.  A drug-eluting stent was successfully placed using a STENT SYNERGY DES 3X38, postdilated to 3.8 mm.  Post intervention, there is a 0% residual stenosis.  Mid LAD-1 lesion is 75% stenosed.  Mid LAD-2 lesion is 75% stenosed.  Mid Cx lesion is 25% stenosed.  There is  mild left ventricular systolic dysfunction.  LV end diastolic pressure is normal.  The left ventricular ejection fraction is 45-50% by visual estimate.  There is no aortic valve stenosis.  Post intervention, there is a 0% residual stenosis.  Continue IV tirofiban for 2 hours. Large burden of thrombus in a large, dominant RCA.  He will need dual antiplatelet therapy for at least a year, Along with aggressive secondary prevention including diabetes control,, lipid management and smoking cessation.   Will consider PCI of the LAD after he has recovered from the inferior MI.   Patient Profile     56 y.o. male with PMH family history of coronary artery disease ( father died from MI in 25s, mother with CABG in 62s ) tobacco use disorder, hypertension, controlled type 2 diabetes mellitus, hyperlipidemia, and depression. Presented with 4 days of intermittent chest pain which was sharp, radiated to his upper back and associated with shortness of breath. In the ED 11/6 EKG showed inferior lateral ST elevation, he was given aspirin, nitro and heparin and taken to the cath lab emergently. In the cath lab he was found to have 100% stenosis of the RCA which was treated with thrombectomy and DES. There was also 75% stenosis of the LAD and 25% stenosis of the circumflex.   Assessment & Plan    Inferior STEMI status post drug-eluting stent to RCA-complete resolution of his chest pain after PCI and DES to the RCA.  Echo yesterday with EF 60-65%, mild hypokinesia of the inferior wall, normal diastolic function and dilated and decreased compressibility of the IVC. Continue medically with aspirin, Brilinta, atorvastatin 80 mg daily, metoprolol tartrate 25 mg twice daily and lisinopril 5 mg daily.  Heart rate is in the 60s today after increasing the dose of his beta blocker yesterday. Will start isosorbide mononitrate and increase beta blocker today. Will need at least one year of DAPT, Cardiac rehab  initiated yesterday.   Vtach - no further vtach overnight, Metoprolol increased yesterday in attempt to help control this.   ?Volume overload - found incidentally on echo, no signs of volume overload on exam, having good diuresis ( 3 L UOP yesterday and net neg 2 L this admission) without lasix   Tobacco use disorder - continue to encourage cessation   Hypertension - blood pressure well controlled on lisinopril and metoprolol.   Type 2 diabetes - blood glucose remains controlled  without the use of insulin   Hyperlipidemia - Atorvastatin 80 mg daily, LDL 48 when he was on atorvastatin 10mg    For questions or updates, please contact [facility] Please consult [email or web address] for contact info under Cardiology/STEMI.      Signed, Eulah PontNina Blum, MD  11/03/2017, 6:34 AM     Patient seen and examined. Agree with assessment and plan. No recurrent chest pain. Tried to have a puff of a cigarette in bathroom yesterday.  Feels better.  Resting pulse in 80 - 90's, will titrate metoprolol to 37.5 mg bid today. Imdur was started for concomitant CAD. Now on atorvastatin 80 mg; LDL 48.  Discussed complete smoking cessation. Will transfer to telemetry. Cardiac Rehab. If no recurrent chest pain possibly home tomorrow with initial med Rx for LAD disease. ECG reviewed by me today. Resoolution of high risk findings. Evolutionary T wave inversion in 2, 3, aVF with preserved R waves suggesting significant myocardial salvage.    Lennette Biharihomas A. Kelly, MD, [facility] 11/03/2017 8:45 AM

## 2017-11-03 NOTE — Progress Notes (Signed)
CARDIAC REHAB PHASE I   PRE:  Rate/Rhythm: 73 SR  BP:  Sitting: 116/80      SaO2: 99% RA  MODE:  Ambulation: 370  ft   POST:  Rate/Rhythm: 86 SR  BP:  Sitting: 140/85      SaO2: 99% RA  Pt ambulated 370 ft steady gait. Pt denied complaints of CP, SOB, and back pain. Pt returned to bed per patient request with call bell within reach. Pt verbalized understanding of education completed yesterday and had no questions. Discussed with him being open to therapies that may help with smoking cessation.   [id number]  York Ceriseyara R Tyriek Hofman MS, ACSM CEP  9:50 AM 11/03/2017

## 2017-11-04 ENCOUNTER — Telehealth (HOSPITAL_COMMUNITY): Payer: Self-pay

## 2017-11-04 LAB — CBC
HCT: 39.6 % (ref 39.0–52.0)
HEMOGLOBIN: 13.5 g/dL (ref 13.0–17.0)
MCH: 33.9 pg (ref 26.0–34.0)
MCHC: 34.1 g/dL (ref 30.0–36.0)
MCV: 99.5 fL (ref 78.0–100.0)
PLATELETS: 146 10*3/uL — AB (ref 150–400)
RBC: 3.98 MIL/uL — AB (ref 4.22–5.81)
RDW: 13.9 % (ref 11.5–15.5)
WBC: 7.7 10*3/uL (ref 4.0–10.5)

## 2017-11-04 LAB — BASIC METABOLIC PANEL
ANION GAP: 7 (ref 5–15)
BUN: <5 mg/dL — ABNORMAL LOW (ref 6–20)
CALCIUM: 8.8 mg/dL — AB (ref 8.9–10.3)
CO2: 22 mmol/L (ref 22–32)
CREATININE: 0.69 mg/dL (ref 0.61–1.24)
Chloride: 107 mmol/L (ref 101–111)
GFR calc Af Amer: >60 mL/min (ref >60)
GFR calc non Af Amer: >60 mL/min (ref >60)
GLUCOSE: 110 mg/dL — AB (ref 65–99)
Potassium: 3.5 mmol/L (ref 3.5–5.1)
Sodium: 136 mmol/L (ref 135–145)

## 2017-11-04 MED ORDER — ISOSORBIDE MONONITRATE ER 30 MG PO TB24: 30.0000 mg | ORAL_TABLET | Freq: Every day | ORAL | 2 refills | Status: DC

## 2017-11-04 MED ORDER — METOPROLOL TARTRATE 50 MG PO TABS: 50.0000 mg | ORAL_TABLET | Freq: Two times a day (BID) | ORAL | 2 refills | Status: DC

## 2017-11-04 MED ORDER — TICAGRELOR 90 MG PO TABS: 90.0000 mg | ORAL_TABLET | Freq: Two times a day (BID) | ORAL | 10 refills | Status: DC

## 2017-11-04 MED ORDER — METOPROLOL TARTRATE 50 MG PO TABS
50.0000 mg | ORAL_TABLET | Freq: Two times a day (BID) | ORAL | Status: DC
  Administered 2017-11-04: 50 mg via ORAL
  Filled 2017-11-04: qty 1

## 2017-11-04 MED ORDER — ATORVASTATIN CALCIUM 80 MG PO TABS: 80.0000 mg | ORAL_TABLET | Freq: Every day | ORAL | 0 refills | Status: DC

## 2017-11-04 MED ORDER — NITROGLYCERIN 0.4 MG SL SUBL: 0.4000 mg | SUBLINGUAL_TABLET | SUBLINGUAL | 2 refills | Status: DC | PRN

## 2017-11-04 MED ORDER — LISINOPRIL 10 MG PO TABS: 5.0000 mg | ORAL_TABLET | Freq: Every day | ORAL | 2 refills | Status: DC

## 2017-11-04 NOTE — Discharge Summary (Signed)
Discharge Summary    Patient ID: Warren ScarletBarton Salas,  MRN: [id number], DOB/AGE: July 20, 1961 56 y.o.  Admit date: 11/01/2017 Discharge date: 11/04/2017  Primary Care Provider: Patient, No Pcp [facility]  Discharge Diagnoses    Active Problems:   Acute inferior myocardial infarction [facility](HCC)   Acute MI, inferior wall (HCC)   Allergies No Known Allergies  Diagnostic Studies/Procedures    Echo 11/7  - Left ventricle: The cavity size was normal. There was mild concentric hypertrophy. Systolic function was normal. The estimated ejection fraction was in the range of 60% to 65%. Mild hypokinesis of the inferior myocardium. Left ventricular diastolic function parameters were normal. - Aortic valve: Transvalvular velocity was within the normal range. There was no stenosis. There was no regurgitation. - Mitral valve: Transvalvular velocity was within the normal range. There was no evidence for stenosis. There was no regurgitation. - Right ventricle: The cavity size was normal. Wall thickness was normal. Systolic function was normal. - Tricuspid valve: There was trivial regurgitation.  Cath 11/6  Mid RCA lesion is 100% stenosed. Treated with thrombectomy and stent placement.  A drug-eluting stent was successfully placed using a STENT SYNERGY DES 3X38, postdilated to 3.8 mm.  Post intervention, there is a 0% residual stenosis.  Mid LAD-1 lesion is 75% stenosed.  Mid LAD-2 lesion is 75% stenosed.  Mid Cx lesion is 25% stenosed.  There is mild left ventricular systolic dysfunction.  LV end diastolic pressure is normal.  The left ventricular ejection fraction is 45-50% by visual estimate.  There is no aortic valve stenosis.  Post intervention, there is a 0% residual stenosis.  Continue IV tirofiban for 2 hours. Large burden of thrombus in a large, dominant RCA.  He will need dual antiplatelet therapy for at least a year, Along  with aggressive secondary prevention including diabetes control,, lipid management and smoking cessation.   Will consider possible PCI of the LAD after he has recovered from the inferior MI. _____________   History of Present Illness     56 y.o. male with PMH family history of coronary artery disease ( father died from MI in 2260s, mother with CABG in 8080s ) tobacco use disorder, hypertension, controlled type 2 diabetes mellitus, hyperlipidemia, and depression.Presented with 4 daysof intermittent chest painwhich wassharp, radiatedto his upper back and associated with shortness of breath. In the ED 11/6 EKG showed inferior lateral ST elevation, he was given aspirin, nitro and heparin and taken to the cath lab emergently. In the cath lab he was found to have 100% stenosis of the RCA which was treated with thrombectomy and DES. There was also 75% stenosis of the LAD and 25% stenosis of the circumflex.  Hospital Course     Inferior STEMI: s/p drug-eluting stent to RCA. Troponin peaked at 19.16. Plan for DAPT with aspirin/Brilinta, along with atorvastatin 80 mg daily, metoprololtartrate37.5 mg BID, isosorbide mononitrate 30 mg qd, and lisinopril5mg  daily. Will need at least one year of DAPT. Worked well with cardiac rehab.  Non sustained v tach - resolved   Tobacco use disorder - continue to encourage cessation   Hypertension- blood pressure well controlled on lisinopril,  Metoprolol, and imdur   Type 2 diabetes- blood glucose remains controlled without the use of insulin   Hyperlipidemia- Atorvastatin 80 mg daily, LDL 48   Warren Salas was seen by Dr. Tresa EndoKelly and determined stable for discharge home. Follow up in the office has been arranged. Medications are listed below.   _____________  Discharge Vitals Blood pressure 120/70, pulse 68, temperature 98.1 F (36.7 C), temperature source Oral, resp. rate (!) 26, height 5\' 6"  (1.676 m), weight 160 lb (72.6 kg), SpO2 98 %.    Filed Weights   11/01/17 1007  Weight: 160 lb (72.6 kg)    Labs & Radiologic Studies    CBC Recent Labs    11/02/17 0423 11/04/17 0739  WBC 7.0 7.7  HGB 12.9* 13.5  HCT 38.5* 39.6  MCV 98.7 99.5  PLT 150 146*   Basic Metabolic Panel Recent Labs    08/65/78 0423 11/04/17 0739  NA 132* 136  K 4.1 3.5  CL 102 107  CO2 23 22  GLUCOSE 90 110*  BUN <5* <5*  CREATININE 0.62 0.69  CALCIUM 8.7* 8.8*   Liver Function Tests No results for input(s): AST, ALT, ALKPHOS, BILITOT, PROT, ALBUMIN in the last 72 hours. No results for input(s): LIPASE, AMYLASE in the last 72 hours. Cardiac Enzymes Recent Labs    11/01/17 2031 11/01/17 2321  CKTOTAL  --  1,244*  CKMB  --  261.4*  TROPONINI 17.42* 19.16*   BNP Invalid input(s): POCBNP D-Dimer No results for input(s): DDIMER in the last 72 hours. Hemoglobin A1C No results for input(s): HGBA1C in the last 72 hours. Fasting Lipid Panel No results for input(s): CHOL, HDL, LDLCALC, TRIG, CHOLHDL, LDLDIRECT in the last 72 hours. Thyroid Function Tests No results for input(s): TSH, T4TOTAL, T3FREE, THYROIDAB in the last 72 hours.  Invalid input(s): FREET3 _____________  Dg Chest Portable 1 View  Result Date: 11/01/2017 CLINICAL DATA:  Mid chest pain for several days, upper back pain, recent fall EXAM: PORTABLE CHEST 1 VIEW COMPARISON:  Chest x-ray of 11/11/2008 FINDINGS: The lungs appear clear. Mediastinal and hilar contours are unremarkable. The heart is within normal limits in size. No bony abnormality is seen. IMPRESSION: No active disease. Electronically Signed   By: Dwyane Dee M.D.   On: 11/01/2017 10:38   Disposition   Pt is being discharged home today in good condition.  Follow-up Plans & Appointments    Follow-up Information    [facility] Follow up.   Why:  Please call # for assistance with physicain referral list to find a primary care doctor Contact information: [phone]       Allayne Butcher, PA-C Follow up on 11/15/2017.   Specialties:  Cardiology, Radiology Why:  at 11am for your follow up appt.  Contact information: [address] [place] [address] [phone]          Discharge Instructions    Amb Referral to Cardiac Rehabilitation   Complete by:  As directed    Diagnosis:   Coronary Stents STEMI     Diet - low sodium heart healthy   Complete by:  As directed    Discharge instructions   Complete by:  As directed    Radial Site Care Refer to this sheet in the next few weeks. These instructions provide you with information on caring for yourself after your procedure. Your caregiver may also give you more specific instructions. Your treatment has been planned according to current medical practices, but problems sometimes occur. Call your caregiver if you have any problems or questions after your procedure. HOME CARE INSTRUCTIONS You may shower the day after the procedure.Remove the bandage (dressing) and gently wash the site with plain soap and water.Gently pat the site dry.  Do not apply powder or lotion to the site.  Do  not submerge the affected site in water for 3 to 5 days.  Inspect the site at least twice daily.  Do not flex or bend the affected arm for 24 hours.  No lifting over 5 pounds (2.3 kg) for 5 days after your procedure.  Do not drive home if you are discharged the same day of the procedure. Have someone else drive you.  You may drive 24 hours after the procedure unless otherwise instructed by your caregiver.  What to expect: Any bruising will usually fade within 1 to 2 weeks.  Blood that collects in the tissue (hematoma) may be painful to the touch. It should usually decrease in size and tenderness within 1 to 2 weeks.  SEEK IMMEDIATE MEDICAL CARE IF: You have unusual pain at the radial site.  You have redness, warmth, swelling, or pain at the radial site.  You have drainage (other than a small amount of blood on the dressing).   You have chills.  You have a fever or persistent symptoms for more than 72 hours.  You have a fever and your symptoms suddenly get worse.  Your arm becomes pale, cool, tingly, or numb.  You have heavy bleeding from the site. Hold pressure on the site.   PLEASE DO NOT MISS ANY DOSES OF YOUR BRILINTA!!!!! Also keep a log of you blood pressures and bring back to your follow up appt. Please call the office with any questions.   Patients taking blood thinners should generally stay away from medicines like ibuprofen, Advil, Motrin, naproxen, and Aleve due to risk of stomach bleeding. You may take Tylenol as directed or talk to your primary doctor about alternatives.   Increase activity slowly   Complete by:  As directed       Discharge Medications     Medication List    STOP taking these medications   DULoxetine 30 MG capsule Commonly known as:  CYMBALTA   oxyCODONE-acetaminophen 5-325 MG tablet Commonly known as:  PERCOCET/ROXICET     TAKE these medications   albuterol 108 (90 Base) MCG/ACT inhaler Commonly known as:  PROVENTIL HFA;VENTOLIN HFA Inhale 1-2 puffs into the lungs every 4 (four) hours as needed for wheezing or shortness of breath.   aspirin EC 81 MG tablet Take 81 mg by mouth daily.   atorvastatin 80 MG tablet Commonly known as:  LIPITOR Take 1 tablet (80 mg total) daily at 6 PM by mouth. What changed:    medication strength  how much to take  when to take this   HYDROcodone-acetaminophen 7.5-325 MG tablet Commonly known as:  NORCO Take 1 tablet by mouth every 8 (eight) hours as needed (For pain.).   isosorbide mononitrate 30 MG 24 hr tablet Commonly known as:  IMDUR Take 1 tablet (30 mg total) daily by mouth. Start taking on:  11/05/2017   lisinopril 10 MG tablet Commonly known as:  PRINIVIL,ZESTRIL Take 0.5 tablets (5 mg total) daily by mouth. What changed:  how much to take   metFORMIN 500 MG tablet Commonly known as:  GLUCOPHAGE Take 500 mg  by mouth 2 (two) times daily.   metoprolol tartrate 50 MG tablet Commonly known as:  LOPRESSOR Take 1 tablet (50 mg total) 2 (two) times daily by mouth.   nitroGLYCERIN 0.4 MG SL tablet Commonly known as:  NITROSTAT Place 1 tablet (0.4 mg total) every 5 (five) minutes as needed under the tongue.   pregabalin 50 MG capsule Commonly known as:  LYRICA Take 50 mg by  mouth 2 (two) times daily.   SYMBICORT 160-4.5 MCG/ACT inhaler Generic drug:  budesonide-formoterol Inhale 2 puffs into the lungs 2 (two) times daily as needed (For shortness of breath.).   ticagrelor 90 MG Tabs tablet Commonly known as:  BRILINTA Take 1 tablet (90 mg total) 2 (two) times daily by mouth.        Aspirin prescribed at discharge?  Yes High Intensity Statin Prescribed? (Lipitor 40-80mg  or Crestor 20-40mg ): Yes Beta Blocker Prescribed? Yes For EF <40%, was ACEI/ARB Prescribed? Yes ADP Receptor Inhibitor Prescribed? (i.e. Plavix etc.-Includes Medically Managed Patients): Yes For EF <40%, Aldosterone Inhibitor Prescribed? No: EF ok Was EF assessed during THIS hospitalization? Yes Was Cardiac Rehab II ordered? (Included Medically managed Patients): Yes   Outstanding Labs/Studies   FLP/LFTs in 6 weeks.   Duration of Discharge Encounter   Greater than 30 minutes including physician time.  Signed, Laverda Page NP-C 11/04/2017, 1:25 PM

## 2017-11-04 NOTE — Telephone Encounter (Signed)
Patients insurance is active and benefits verified through Medicare Part A & B - No co-pay, deductible amount of $183.00/$183.00 has been met, no out of pocket, 20% co-insurance, and no pre-authorization is required. Passport/reference [phone]  Patient will be contacted and scheduled after their follow appt with the Cardiologist office upon review by the RN Navigator.

## 2017-11-04 NOTE — Progress Notes (Signed)
Progress Note  Patient Name: Warren ScarletBarton Salas Date of Encounter: 11/04/2017  Primary Cardiologist: new to Dr. Eldridge DaceVaranasi  Subjective   Feeling well today, free of new concerns or complaints.   Inpatient Medications    Scheduled Meds: . aspirin  81 mg Oral Daily  . atorvastatin  80 mg Oral q1800  . Chlorhexidine Gluconate Cloth  6 each Topical Q0600  . isosorbide mononitrate  30 mg Oral Daily  . lisinopril  5 mg Oral Daily  . metoprolol tartrate  37.5 mg Oral BID  . mometasone-formoterol  2 puff Inhalation BID  . mupirocin ointment  1 application Nasal BID  . pregabalin  50 mg Oral BID  . sodium chloride flush  3 mL Intravenous Q12H  . ticagrelor  90 mg Oral BID   Continuous Infusions: . sodium chloride Stopped (11/02/17 1200)  . sodium chloride     PRN Meds: sodium chloride, acetaminophen, albuterol, guaiFENesin-dextromethorphan, Influenza vac split quadrivalent PF, morphine injection, ondansetron (ZOFRAN) IV, sodium chloride flush   Vital Signs    Vitals:   11/04/17 0340 11/04/17 0400 11/04/17 0500 11/04/17 0600  BP:  97/61  103/70  Pulse:  64 64 62  Resp:  15 18 18   Temp: 98.5 F (36.9 C)     TempSrc: Oral     SpO2:  99% 99% 98%  Weight:      Height:        Intake/Output Summary (Last 24 hours) at 11/04/2017 0654 Last data filed at 11/04/2017 [id number] Gross per 24 hour  Intake 1200 ml  Output 2700 ml  Net -1500 ml   Filed Weights   11/01/17 1007  Weight: 160 lb (72.6 kg)    Telemetry    Sinus rhythm - Personally Reviewed  Physical Exam   GEN: sleeping comfortably in bed, No acute distress.   Neck: No JVD Cardiac: RRR, no murmurs, rubs, or gallops.  Respiratory: Clear to auscultation bilaterally. GI: Soft, nontender, distended  MS: No edema; No deformity. Neuro:  Nonfocal  Psych: Normal affect   Labs    Chemistry Recent Labs  Lab 11/01/17 0945 11/01/17 1019 11/02/17 0423  NA 132* 133* 132*  K 4.0 4.1 4.1  CL 100* 99* 102  CO2 19*  --  23   GLUCOSE 193* 195* 90  BUN <5* <3* <5*  CREATININE 0.87 0.70 0.62  CALCIUM 9.5  --  8.7*  GFRNONAA >60  --  >60  GFRAA >60  --  >60  ANIONGAP 13  --  7     Hematology Recent Labs  Lab 11/01/17 0945 11/01/17 1019 11/02/17 0423  WBC 5.6  --  7.0  RBC 4.85  --  3.90*  HGB 16.5 16.3 12.9*  HCT 47.3 48.0 38.5*  MCV 97.5  --  98.7  MCH 34.0  --  33.1  MCHC 34.9  --  33.5  RDW 14.2  --  14.0  PLT 202  --  150    Cardiac Enzymes Recent Labs  Lab 11/01/17 1015 11/01/17 1140 11/01/17 2031 11/01/17 2321  TROPONINI 0.09* 1.52* 17.42* 19.16*    Recent Labs  Lab 11/01/17 1002  TROPIPOC 0.11*     BNPNo results for input(s): BNP, PROBNP in the last 168 hours.   DDimer No results for input(s): DDIMER in the last 168 hours.   Radiology    No results found.  Cardiac Studies   Echo 11/7  - Left ventricle: The cavity size was normal. There was mild   concentric  hypertrophy. Systolic function was normal. The   estimated ejection fraction was in the range of 60% to 65%. Mild   hypokinesis of the inferior myocardium. Left ventricular   diastolic function parameters were normal. - Aortic valve: Transvalvular velocity was within the normal range.   There was no stenosis. There was no regurgitation. - Mitral valve: Transvalvular velocity was within the normal range.   There was no evidence for stenosis. There was no regurgitation. - Right ventricle: The cavity size was normal. Wall thickness was   normal. Systolic function was normal. - Tricuspid valve: There was trivial regurgitation.  Cath 11/6  Mid RCA lesion is 100% stenosed. Treated with thrombectomy and stent placement.  A drug-eluting stent was successfully placed using a STENT SYNERGY DES 3X38, postdilated to 3.8 mm.  Post intervention, there is a 0% residual stenosis.  Mid LAD-1 lesion is 75% stenosed.  Mid LAD-2 lesion is 75% stenosed.  Mid Cx lesion is 25% stenosed.  There is mild left ventricular  systolic dysfunction.  LV end diastolic pressure is normal.  The left ventricular ejection fraction is 45-50% by visual estimate.  There is no aortic valve stenosis.  Post intervention, there is a 0% residual stenosis.  Continue IV tirofiban for 2 hours. Large burden of thrombus in a large, dominant RCA.  He will need dual antiplatelet therapy for at least a year, Along with aggressive secondary prevention including diabetes control,, lipid management and smoking cessation.   Will consider possible PCI of the LAD after he has recovered from the inferior MI.   Patient Profile     56 y.o. male with PMH family history of coronary artery disease ( father died from MI in 8460s, mother with CABG in 5280s ) tobacco use disorder, hypertension, controlled type 2 diabetes mellitus, hyperlipidemia, and depression. Presented with 4 daysof intermittent chest pain which was sharp, radiated to his upper back and associated with shortness of breath. In the ED 11/6 EKG showed inferior lateral ST elevation, he was given aspirin, nitro and heparin and taken to the cath lab emergently. In the cath lab he was found to have 100% stenosis of the RCA which was treated with thrombectomy and DES. There was also 75% stenosis of the LAD and 25% stenosis of the circumflex.  Assessment & Plan    Inferior STEMI status post drug-eluting stent to RCA- Treating medically with aspirin, Brilinta, atorvastatin 80 mg daily, metoprolol tartrate 37.5 mg twice daily, isosorbide mononitrate 30 mg qd, and lisinopril 5 mg daily. Will need at least one year of DAPT. With his HR 65-70 there may be room to increase the metoprolol further today. Phase I cardiac rehab initiated.   Non sustained v tach - resolved   Tobacco use disorder - continue to encourage cessation   Hypertension - blood pressure well controlled on lisinopril,  Metoprolol, and imdur   Type 2 diabetes - blood glucose remains controlled without the use of  insulin   Hyperlipidemia - Atorvastatin 80 mg daily, LDL 48  For questions or updates, please contact [facility] Please consult [email or web address] for contact info under Cardiology/STEMI.      Signed, Eulah PontNina Blum, MD  11/04/2017, 6:54 AM    Patient seen and examined. Agree with assessment and plan. Day 3 s/p IMI. No bed available yesterday to transfer out of CCU.  No recurrent chest pain. No recurrent NSVT. BP 120/70's HR 75 -85; will titrate metoprolol to 50 mg bid.  Discussed Cardiac Rehab; recommend Phase 2; Discussed smoking  cessation. ECG stable. Will initially try medical therapy for LAD disease, and if recurrent symptoms may need PCI.  Will plan for dc today and f/u with Dr. Eldridge Dace.   Lennette Bihari, MD, [facility] 11/04/2017 8:26 AM

## 2017-11-04 NOTE — Progress Notes (Signed)
Patient discharge paperwork gone over in detail with patient. Educated patient on the importance of quitting smoking. Taught radial site care. Educated patient on medications. Tele discontinued, iv's removed intact. VSS. Patient discharged to home with mother by way of wheelchair. All questions answered to patient satisfaction.   Delories HeinzMelissa Dlisa Barnwell, RN

## 2017-11-04 NOTE — Progress Notes (Signed)
CARDIAC REHAB PHASE I   PRE:  Rate/Rhythm: 89 SR :      SaO2: 95 %RA  MODE:  Ambulation: 370 ft   POST:  Rate/Rhythm: 103 ST  BP:  Supine:   Sitting: 131/83  Standing:    SaO2: 100%RA 1000-1020 Pt walked 370 ft on RA with steady gait. No complaints. Wants to explore options to help with smoking cessation with cardiologist.  Encouraged pt to adhere to healthy lifestyle.    Luetta Nuttingharlene Clarnce Homan, RN BSN  11/04/2017 10:14 AM

## 2017-11-15 ENCOUNTER — Ambulatory Visit (INDEPENDENT_AMBULATORY_CARE_PROVIDER_SITE_OTHER): Payer: Medicare Other | Admitting: Cardiology

## 2017-11-15 ENCOUNTER — Encounter: Payer: Self-pay | Admitting: Cardiology

## 2017-11-15 VITALS — BP 124/68 | HR 90 | Ht 66.0 in | Wt 163.8 lb

## 2017-11-15 DIAGNOSIS — Z9861 Coronary angioplasty status: Secondary | ICD-10-CM | POA: Diagnosis not present

## 2017-11-15 DIAGNOSIS — I2510 Atherosclerotic heart disease of native coronary artery without angina pectoris: Secondary | ICD-10-CM | POA: Diagnosis not present

## 2017-11-15 NOTE — Progress Notes (Signed)
11/15/2017 Warren Salas   [birth year]  [id number]  Primary Physician Patient, No Pcp Per Primary Cardiologist: Dr. Eldridge DaceVaranasi    Reason for Visit/CC: [facility] f/u for STEMI  HPI:  Warren ScarletBarton Fedie is a 56 y.o. male who presents to clinic for post hospital f/u after recent admission for acute STEMI. He has multiple cardiac risk factors including family h/o CAD (father with fatal MI at age 56, mother required CABG). Also with a h/o tobacco use, HTN, HLD and T2DM.  [facility] with chest pain and EKG c/w acute STEMI. Troponin level peaked at 19.6. He was taken to the cath lab for emergent LHC by Dr. Eldridge DaceVaranasi and was found to have a 100% occluded RCA, treated with thrombectomy + DES. Also noted to have a 75% mid LAD and 25% LCx, treated medically. EF was normal at 60-65%. He was placed on DAPT w/ ASA + Brilina, high dose statin therapy with Lipitor (LDL 48 mg/dL), Imdur, metoprolol and lisinopril. Dr. Eldridge DaceVaranasi noted to consider repeat cath for intervention of the LAD if still with recurrent CP.   He presents back for post hospital f/u. He notes significant improvement in symptoms. No recurrent CP. No dyspnea. No exertional symptoms. He reports full med compliance. No missed doses of antiplatelets. No radial cath site complications. Unfortunately he continues to smoke but has cut down from 2ppd>>1ppd. BP is well controlled at 124/68.   Current Meds  Medication Sig  . albuterol (PROVENTIL HFA;VENTOLIN HFA) 108 (90 Base) MCG/ACT inhaler Inhale 1-2 puffs into the lungs every 4 (four) hours as needed for wheezing or shortness of breath.   Marland Kitchen. aspirin EC 81 MG tablet Take 81 mg by mouth daily.  Marland Kitchen. atorvastatin (LIPITOR) 80 MG tablet Take 1 tablet (80 mg total) daily at 6 PM by mouth.  . budesonide-formoterol (SYMBICORT) 160-4.5 MCG/ACT inhaler Inhale 2 puffs into the lungs 2 (two) times daily as needed (For shortness of breath.).   Marland Kitchen. isosorbide mononitrate (IMDUR) 30 MG 24 hr tablet  Take 1 tablet (30 mg total) daily by mouth.  Marland Kitchen. lisinopril (PRINIVIL,ZESTRIL) 10 MG tablet Take 0.5 tablets (5 mg total) daily by mouth.  . metFORMIN (GLUCOPHAGE) 500 MG tablet Take 500 mg by mouth 2 (two) times daily.  . metoprolol tartrate (LOPRESSOR) 50 MG tablet Take 1 tablet (50 mg total) 2 (two) times daily by mouth.  . nitroGLYCERIN (NITROSTAT) 0.4 MG SL tablet Place 1 tablet (0.4 mg total) every 5 (five) minutes as needed under the tongue.  . pregabalin (LYRICA) 50 MG capsule Take 50 mg by mouth 2 (two) times daily.  . ticagrelor (BRILINTA) 90 MG TABS tablet Take 1 tablet (90 mg total) 2 (two) times daily by mouth.   No Known Allergies Past Medical History:  Diagnosis Date  . Back pain   . COPD (chronic obstructive pulmonary disease) (HCC)   . Diabetes mellitus without complication (HCC)   . Hypertension    Family History  Problem Relation Age of Onset  . Hypertension Mother   . Diabetes Mother   . Hypertension Father   . Diabetes Father    Past Surgical History:  Procedure Laterality Date  . APPENDECTOMY    . Coronary/Graft Acute MI Revascularization N/A 11/01/2017   Performed by Corky CraftsVaranasi, Jayadeep S, [facility]  . LEFT HEART CATH AND CORONARY ANGIOGRAPHY N/A 11/01/2017   Performed by Corky CraftsVaranasi, Jayadeep S, [facility]   Social History   Socioeconomic  History  . Marital status: Divorced    Spouse name: Not on file  . Number of children: Not on file  . Years of education: Not on file  . Highest education level: Not on file  Social Needs  . Financial resource strain: Not on file  . Food insecurity - worry: Not on file  . Food insecurity - inability: Not on file  . Transportation needs - medical: Not on file  . Transportation needs - non-medical: Not on file  Occupational History  . Not on file  Tobacco Use  . Smoking status: Current Every Day Smoker  . Smokeless tobacco: Never Used  Substance and Sexual Activity  . Alcohol use: Yes  .  Drug use: Yes    Comment: heroin   . Sexual activity: No  Other Topics Concern  . Not on file  Social History Narrative  . Not on file     Review of Systems: General: negative for chills, fever, night sweats or weight changes.  Cardiovascular: negative for chest pain, dyspnea on exertion, edema, orthopnea, palpitations, paroxysmal nocturnal dyspnea or shortness of breath Dermatological: negative for rash Respiratory: negative for cough or wheezing Urologic: negative for hematuria Abdominal: negative for nausea, vomiting, diarrhea, bright red blood per rectum, melena, or hematemesis Neurologic: negative for visual changes, syncope, or dizziness All other systems reviewed and are otherwise negative except as noted above.   Physical Exam:  Blood pressure 124/68, pulse 90, height 5\' 6"  (1.676 m), weight 163 lb 12.8 oz (74.3 kg), SpO2 97 %.  General appearance: alert, cooperative and no distress Neck: no carotid bruit and no JVD Lungs: clear to auscultation bilaterally Heart: regular rate and rhythm, S1, S2 normal, no murmur, click, rub or gallop Extremities: extremities normal, atraumatic, no cyanosis or edema Pulses: 2+ and symmetric Skin: Skin color, texture, turgor normal. No rashes or lesions Neurologic: Grossly normal  EKG not performed -- personally reviewed   ASSESSMENT AND PLAN:   1. CAD: s/p STEMI 2/2 totally occluded RCA s/p thrombectomy + DES. Also residual 75% mid LAD and 25% LCx, treated medically. EF normal. Dr. Jim LikeVarnasi noted in cath report to later consider intervention on the LAD if recurrent angina. Pt doing well w/o recurrent CP. No dyspnea. No exertional symptoms. BP is well controlled. Continue DAPT w/ ASA + Brilinta, high intensity statin, BB and LA nitrate. Pt has SL NTG at home for PRN use. He knows to notify our office if he has any recurrent angina and if needing to take SL NTG. If recurrent symptoms, will need to consider intervention on LAD.   2. Tobacco  Abuse: still smoking but has cut back from 2ppd down to 1ppd. We discussed the importance of complete smoking cessation given his CAD.   3. HTN: controlled on current regimen.   4. Lipids: LDL controlled at 48 mg/dL however continue high dose statin for plaque stabilization to reduce risk of rupture/MI.   5. DM: on metformin and controlled. Hgb A1c 6.1.    Follow-Up w/ Dr. Eldridge DaceVaranasi in 2 months   Aracely Rickett Delmer IslamSimmons PA-C, [facility] 11/15/2017 10:55 AM

## 2017-11-15 NOTE — Patient Instructions (Signed)
Medication Instructions:    Your physician recommends that you continue on your current medications as directed. Please refer to the Current Medication list given to you today.   If you need a refill on your cardiac medications before your next appointment, please call your pharmacy.  Labwork:  NONE ORDERED  TODAY   Testing/Procedures:  NONE ORDERED  TODAY   Follow-Up:  WITH DR Eldridge DaceVARANASI NEXT AVAILABLE   Any Other Special Instructions Will Be Listed Below (If Applicable).

## 2017-11-16 ENCOUNTER — Telehealth (HOSPITAL_COMMUNITY): Payer: Self-pay

## 2017-11-16 NOTE — Telephone Encounter (Signed)
Attempted to call patient in regards to Cardiac Rehab - Voicemail box is full.

## 2017-11-23 ENCOUNTER — Telehealth (HOSPITAL_COMMUNITY): Payer: Self-pay

## 2017-11-23 ENCOUNTER — Encounter (HOSPITAL_COMMUNITY): Payer: Self-pay

## 2017-11-23 NOTE — Telephone Encounter (Signed)
2nd attempt to call patient - Voicemail box is full. Sending letter.

## 2017-11-30 ENCOUNTER — Telehealth (HOSPITAL_COMMUNITY): Payer: Self-pay

## 2017-11-30 NOTE — Telephone Encounter (Signed)
Called and spoke with patient in regards to Cardiac Rehab - Patient is not interested in program. Closed referral. °

## 2017-12-06 ENCOUNTER — Ambulatory Visit: Payer: Medicare Other | Admitting: Interventional Cardiology

## 2017-12-07 NOTE — Progress Notes (Deleted)
Cardiology Office Note   Date:  12/07/2017   ID:  Warren Salas, DOB 04/10/1961, MRN [id number]  PCP:  Patient, No Pcp Per    No chief complaint on file.  CAD/MI  Wt Readings from Last 3 Encounters:  11/15/17 163 lb 12.8 oz (74.3 kg)  11/01/17 160 lb (72.6 kg)  03/28/12 196 lb (88.9 kg)       History of Present Illness: Warren Salas is a 56 y.o. male  who presents to clinic for post hospital f/u after recent admission for acute STEMI. He has multiple cardiac risk factors including family h/o CAD (father with fatal MI at age 56, mother required CABG). Also with a h/o tobacco use, HTN, HLD and T2DM.  [facility] with chest pain and EKG c/w acute STEMI. Troponin level peaked at 19.6. He was taken to the cath lab for emergent LHC by Dr. Eldridge DaceVaranasi and was found to have a 100% occluded RCA, treated with thrombectomy + DES. Also noted to have a 75% mid LAD and 25% LCx, treated medically. EF was normal at 60-65%. He was placed on DAPT w/ ASA + Brilina, high dose statin therapy with Lipitor (LDL 48 mg/dL), Imdur, metoprolol and lisinopril. Plan was to consider repeat cath for intervention of the LAD if still with recurrent CP.   He was doing well at his f/u with Eduard RouxBrritainy Simmons.    Past Medical History:  Diagnosis Date  . Back pain   . COPD (chronic obstructive pulmonary disease) (HCC)   . Diabetes mellitus without complication (HCC)   . Hypertension     Past Surgical History:  Procedure Laterality Date  . APPENDECTOMY    . CORONARY/GRAFT ACUTE MI REVASCULARIZATION N/A 11/01/2017   Procedure: Coronary/Graft Acute MI Revascularization;  Surgeon: Corky CraftsVaranasi, Allin Frix S, MD;  [facility];  Service: Cardiovascular;  Laterality: N/A;  . LEFT HEART CATH AND CORONARY ANGIOGRAPHY N/A 11/01/2017   Procedure: LEFT HEART CATH AND CORONARY ANGIOGRAPHY;  Surgeon: Corky CraftsVaranasi, Janthony Holleman S, MD;  [facility];  Service: Cardiovascular;   Laterality: N/A;     Current Outpatient Medications  Medication Sig Dispense Refill  . albuterol (PROVENTIL HFA;VENTOLIN HFA) 108 (90 Base) MCG/ACT inhaler Inhale 1-2 puffs into the lungs every 4 (four) hours as needed for wheezing or shortness of breath.     Marland Kitchen. aspirin EC 81 MG tablet Take 81 mg by mouth daily.    Marland Kitchen. atorvastatin (LIPITOR) 80 MG tablet Take 1 tablet (80 mg total) daily at 6 PM by mouth. 90 tablet 0  . budesonide-formoterol (SYMBICORT) 160-4.5 MCG/ACT inhaler Inhale 2 puffs into the lungs 2 (two) times daily as needed (For shortness of breath.).     Marland Kitchen. isosorbide mononitrate (IMDUR) 30 MG 24 hr tablet Take 1 tablet (30 mg total) daily by mouth. 30 tablet 2  . lisinopril (PRINIVIL,ZESTRIL) 10 MG tablet Take 0.5 tablets (5 mg total) daily by mouth. 30 tablet 2  . metFORMIN (GLUCOPHAGE) 500 MG tablet Take 500 mg by mouth 2 (two) times daily.    . metoprolol tartrate (LOPRESSOR) 50 MG tablet Take 1 tablet (50 mg total) 2 (two) times daily by mouth. 60 tablet 2  . nitroGLYCERIN (NITROSTAT) 0.4 MG SL tablet Place 1 tablet (0.4 mg total) every 5 (five) minutes as needed under the tongue. 25 tablet 2  . pregabalin (LYRICA) 50 MG capsule Take 50 mg by mouth 2 (two) times daily.    . ticagrelor (BRILINTA) 90  MG TABS tablet Take 1 tablet (90 mg total) 2 (two) times daily by mouth. 60 tablet 10   No current facility-administered medications for this visit.     Allergies:   Patient has no known allergies.    Social History:  The patient  reports that he has been smoking.  he has never used smokeless tobacco. He reports that he drinks alcohol. He reports that he uses drugs.   Family History:  The patient's ***family history includes Diabetes in his father and mother; Hypertension in his father and mother.    ROS:  Please see the history of present illness.   Otherwise, review of systems are positive for ***.   All other systems are reviewed and negative.    PHYSICAL EXAM: VS:  There  were no vitals taken for this visit. , BMI There is no height or weight on file to calculate BMI. GEN: Well nourished, well developed, in no acute distress  HEENT: normal  Neck: no JVD, carotid bruits, or masses Cardiac: ***RRR; no murmurs, rubs, or gallops,no edema  Respiratory:  clear to auscultation bilaterally, normal work of breathing GI: soft, nontender, nondistended, + BS MS: no deformity or atrophy  Skin: warm and dry, no rash Neuro:  Strength and sensation are intact Psych: euthymic mood, full affect   EKG:   The ekg ordered today demonstrates ***   Recent Labs: 11/04/2017: BUN <5; Creatinine, Ser 0.69; Hemoglobin 13.5; Platelets 146; Potassium 3.5; Sodium 136   Lipid Panel    Component Value Date/Time   CHOL 145 11/01/2017 1015   TRIG 100 11/01/2017 1015   HDL 77 11/01/2017 1015   CHOLHDL 1.9 11/01/2017 1015   VLDL 20 11/01/2017 1015   LDLCALC 48 11/01/2017 1015     Other studies Reviewed: Additional studies/ records that were reviewed today with results demonstrating: ***.   ASSESSMENT AND PLAN:  1. CAD: s/p RCA stent.  Residual LAD disease.  2. Tobacco abuse: 3. HTN: 4. Hyperlipidemia: 5. DM:   Current medicines are reviewed at length with the patient today.  The patient concerns regarding his medicines were addressed.  The following changes have been made:  No change***  Labs/ tests ordered today include: *** No orders of the defined types were placed in this encounter.   Recommend 150 minutes/week of aerobic exercise Low fat, low carb, high fiber diet recommended  Disposition:   FU in ***   Signed, Lance MussJayadeep Catheleen Langhorne, MD  12/07/2017 1:11 PM    [facility] [address], [place], [place]  [address] Phone: [phone]; Fax: [phone]

## 2017-12-08 ENCOUNTER — Ambulatory Visit: Payer: Medicare Other | Admitting: Interventional Cardiology

## 2017-12-09 ENCOUNTER — Encounter: Payer: Self-pay | Admitting: Interventional Cardiology

## 2017-12-12 ENCOUNTER — Encounter: Payer: Self-pay | Admitting: Interventional Cardiology

## 2017-12-12 ENCOUNTER — Ambulatory Visit (INDEPENDENT_AMBULATORY_CARE_PROVIDER_SITE_OTHER): Payer: Medicare Other | Admitting: Interventional Cardiology

## 2017-12-12 VITALS — BP 138/82 | HR 110 | Ht 66.0 in | Wt 162.2 lb

## 2017-12-12 DIAGNOSIS — Z9861 Coronary angioplasty status: Secondary | ICD-10-CM | POA: Diagnosis not present

## 2017-12-12 DIAGNOSIS — I252 Old myocardial infarction: Secondary | ICD-10-CM

## 2017-12-12 DIAGNOSIS — E118 Type 2 diabetes mellitus with unspecified complications: Secondary | ICD-10-CM | POA: Insufficient documentation

## 2017-12-12 DIAGNOSIS — Z720 Tobacco use: Secondary | ICD-10-CM | POA: Diagnosis not present

## 2017-12-12 DIAGNOSIS — I25118 Atherosclerotic heart disease of native coronary artery with other forms of angina pectoris: Secondary | ICD-10-CM | POA: Diagnosis not present

## 2017-12-12 DIAGNOSIS — I2510 Atherosclerotic heart disease of native coronary artery without angina pectoris: Secondary | ICD-10-CM

## 2017-12-12 MED ORDER — ATORVASTATIN CALCIUM 80 MG PO TABS: 80.0000 mg | ORAL_TABLET | Freq: Every day | ORAL | 3 refills | Status: DC

## 2017-12-12 MED ORDER — TICAGRELOR 90 MG PO TABS: 90.0000 mg | ORAL_TABLET | Freq: Two times a day (BID) | ORAL | 3 refills | Status: DC

## 2017-12-12 MED ORDER — LISINOPRIL 10 MG PO TABS: 5.0000 mg | ORAL_TABLET | Freq: Every day | ORAL | 3 refills | Status: DC

## 2017-12-12 MED ORDER — METOPROLOL TARTRATE 50 MG PO TABS: 50.0000 mg | ORAL_TABLET | Freq: Two times a day (BID) | ORAL | 3 refills | Status: DC

## 2017-12-12 MED ORDER — ISOSORBIDE MONONITRATE ER 30 MG PO TB24: 30.0000 mg | ORAL_TABLET | Freq: Every day | ORAL | 3 refills | Status: DC

## 2017-12-12 NOTE — Patient Instructions (Addendum)
Medication Instructions:  Your physician recommends that you continue on your current medications as directed. Please refer to the Current Medication list given to you today.   Labwork: Your physician recommends that you return for lab work on 12/22/17 for BMET, CBC, PT/INR   Testing/Procedures: Your physician has requested that you have a coronary stent intervention on 12/29/17.   Follow-Up: Your physician recommends that you schedule a follow-up appointment 2 weeks after Stent Placement with Dr. Eldridge DaceVaranasi or APP on his team.    Any Other Special Instructions Will Be Listed Below (If Applicable).    [facility] [address] [facility] [address], [address] [place] [address] Dept: [phone] Loc: [phone]  Sherre ScarletBarton Hemm  12/12/2017  You are scheduled for a Coronary Stent Intervention on Thursday, January 3 with Dr. Lance MussJayadeep Varanasi.  1. [facility]: [address] [place], [place] [address] at 8:00 AM (two hours before your procedure to ensure your preparation). Free valet parking service is available.   Special note: Every effort is made to have your procedure done on time. Please understand that emergencies sometimes delay scheduled procedures.  2. Diet: Do not eat or drink anything after midnight prior to your procedure except sips of water to take medications.  3. Labs: You will need to have blood drawn on Thursday, December 27 at [facility] [address], [place]  Open: 7:30am - 5pm    Phone: [phone]. You do not need to be fasting.  4. Medication instructions in preparation for your procedure:  DO NOT take Metformin 24 hours prior to your procedure and for 48 hours after your procedure  On the morning of your procedure, take your Aspirin and Brilinta/Ticagrelor and any morning medicines NOT  listed above.  You may use sips of water.  5. Plan for one night stay--bring personal belongings. 6. Bring a current list of your medications and current insurance cards. 7. You MUST have a responsible person to drive you home. 8. Someone MUST be with you the first 24 hours after you arrive home or your discharge will be delayed. 9. Please wear clothes that are easy to get on and off and wear slip-on shoes.  Thank you for allowing us to care for you!   -- [facility]    If you need a refill on your cardiac medications before your next appointment, please call your pharmacy.

## 2017-12-12 NOTE — Progress Notes (Addendum)
Cardiology Office Note   Date:  12/12/2017   ID:  Warren Salas, DOB 03/28/1961, MRN [id number]  PCP:  Patient, No Pcp Per    No chief complaint on file. CAD   Wt Readings from Last 3 Encounters:  12/12/17 162 lb 4 oz (73.6 kg)  11/15/17 163 lb 12.8 oz (74.3 kg)  11/01/17 160 lb (72.6 kg)       History of Present Illness: Warren Salas is a 56 y.o. male  who presents to clinic for post hospital f/u after recent admission for acute STEMI. He has multiple cardiac risk factors including family h/o CAD (father with fatal MI at age 60, mother required CABG). Also with a h/o tobacco use, HTN, HLD and T2DM.  Pt [facility] to [facility] on 11/01/17 with chest pain and EKG c/w acute STEMI. Troponin level peaked at 19.6. He was taken to the cath lab for emergent LHC and was found to have a 100% occluded RCA, treated with thrombectomy + DES. Also noted to have a 75% mid LAD and 25% LCx, treated medically. EF was normal at 60-65%. He was placed on DAPT w/ ASA + Brilina, high dose statin therapy with Lipitor (LDL 48 mg/dL), Imdur, metoprolol and lisinopril. We were to consider repeat cath for intervention of the LAD if still with recurrent CP.   The patient was doing well in 11/18 at f/u with [facility].  Since then, her reports using SL NTG on several occasions, typically when he gets emotionally stressed.  He has decreased tobacco use, but has not quit completely.  He has taken his Brilinta regularly.  He is out of his other medications.  Denies : Chest pain. Dizziness. Leg edema. Orthopnea. Palpitations. Paroxysmal nocturnal dyspnea. Shortness of breath. Syncope.   He has a chronic cough.     Past Medical History:  Diagnosis Date  . Back pain   . COPD (chronic obstructive pulmonary disease) (HCC)   . Diabetes mellitus without complication (HCC)   . Hypertension     Past Surgical History:  Procedure Laterality Date  . APPENDECTOMY    . CORONARY/GRAFT ACUTE MI  REVASCULARIZATION N/A 11/01/2017   Procedure: Coronary/Graft Acute MI Revascularization;  Surgeon: Corky Crafts, MD;  Location: [facility] INVASIVE CV LAB;  Service: Cardiovascular;  Laterality: N/A;  . LEFT HEART CATH AND CORONARY ANGIOGRAPHY N/A 11/01/2017   Procedure: LEFT HEART CATH AND CORONARY ANGIOGRAPHY;  Surgeon: Corky Crafts, MD;  [facility] INVASIVE CV LAB;  Service: Cardiovascular;  Laterality: N/A;     Current Outpatient Medications  Medication Sig Dispense Refill  . albuterol (PROVENTIL HFA;VENTOLIN HFA) 108 (90 Base) MCG/ACT inhaler Inhale 1-2 puffs into the lungs every 4 (four) hours as needed for wheezing or shortness of breath.     Marland Kitchen aspirin EC 81 MG tablet Take 81 mg by mouth daily.    Marland Kitchen atorvastatin (LIPITOR) 80 MG tablet Take 1 tablet (80 mg total) daily at 6 PM by mouth. 90 tablet 0  . budesonide-formoterol (SYMBICORT) 160-4.5 MCG/ACT inhaler Inhale 2 puffs into the lungs 2 (two) times daily as needed (For shortness of breath.).     Marland Kitchen isosorbide mononitrate (IMDUR) 30 MG 24 hr tablet Take 1 tablet (30 mg total) daily by mouth. 30 tablet 2  . lisinopril (PRINIVIL,ZESTRIL) 10 MG tablet Take 0.5 tablets (5 mg total) daily by mouth. 30 tablet 2  . metFORMIN (GLUCOPHAGE) 500 MG tablet Take 500 mg by mouth 2 (two) times daily.    . metoprolol  tartrate (LOPRESSOR) 50 MG tablet Take 1 tablet (50 mg total) 2 (two) times daily by mouth. 60 tablet 2  . nitroGLYCERIN (NITROSTAT) 0.4 MG SL tablet Place 1 tablet (0.4 mg total) every 5 (five) minutes as needed under the tongue. 25 tablet 2  . pregabalin (LYRICA) 50 MG capsule Take 50 mg by mouth 2 (two) times daily.    . ticagrelor (BRILINTA) 90 MG TABS tablet Take 1 tablet (90 mg total) 2 (two) times daily by mouth. 60 tablet 10   No current facility-administered medications for this visit.     Allergies:   Patient has no known allergies.    Social History:  The patient  reports that he has been smoking.  he has never used  smokeless tobacco. He reports that he drinks alcohol. He reports that he uses drugs.   Family History:  The patient's family history includes Diabetes in his father and mother; Hypertension in his father and mother.    ROS:  Please see the history of present illness.   Otherwise, review of systems are positive for unable to stop smoking.   All other systems are reviewed and negative.    PHYSICAL EXAM: VS:  BP 138/82   Pulse (!) 110   Ht 5\' 6"  (1.676 m)   Wt 162 lb 4 oz (73.6 kg)   SpO2 97%   BMI 26.19 kg/m  , BMI Body mass index is 26.19 kg/m. GEN: Well nourished, well developed, in no acute distress ; smells of tobacco HEENT: normal  Neck: no JVD, carotid bruits, or masses Cardiac: RRR; no murmurs, rubs, or gallops,no edema  Respiratory:  clear to auscultation bilaterally, normal work of breathing GI: soft, nontender, nondistended, + BS MS: no deformity or atrophy  Skin: warm and dry, no rash Neuro:  Strength and sensation are intact Psych: euthymic mood, full affect   EKG:   The ekg ordered today demonstrates NSR, inferior T wave inversions- no change from hospital ECG   Recent Labs: 11/04/2017: BUN <5; Creatinine, Ser 0.69; Hemoglobin 13.5; Platelets 146; Potassium 3.5; Sodium 136   Lipid Panel    Component Value Date/Time   CHOL 145 11/01/2017 1015   TRIG 100 11/01/2017 1015   HDL 77 11/01/2017 1015   CHOLHDL 1.9 11/01/2017 1015   VLDL 20 11/01/2017 1015   LDLCALC 48 11/01/2017 1015     Other studies Reviewed: Additional studies/ records that were reviewed today with results demonstrating: cath films reviewed, ulcerative LAD lesion noted..   ASSESSMENT AND PLAN:  1. CAD/Old MI: Episodic angina, treated with sublingual nitroglycerin.  He is trying excessive stress.  Given the ulcerative LAD lesion, I recommended PCI.  It is a long area to treat and may require 2 overlapping stents.  He prefers to wait until after the holidays.  We will schedule something for  early January.  COntinue DAPT.  All questions about the procedure were answered. 2. Tobacco abuse: I stressed the importance of stopping smoking. 3. DM: Continue metformin for blood sugar control. 4. He needs to find a PMD for preventive care.  A list of PMDs was given to him.     Current medicines are reviewed at length with the patient today.  The patient concerns regarding his medicines were addressed.  The following changes have been made:  No change  Labs/ tests ordered today include:  No orders of the defined types were placed in this encounter.   Recommend 150 minutes/week of aerobic exercise Low fat, low carb,  high fiber diet recommended  Disposition:   FU post cath   Signed, Lance MussJayadeep Varanasi, MD  12/12/2017 2:01 PM    [facility] [address], [place], [place]  [address] Phone: [phone]; Fax: [phone]

## 2017-12-12 NOTE — H&P (View-Only) (Signed)
Cardiology Office Note   Date:  12/12/2017   ID:  Warren Salas, DOB 03/28/1961, MRN [id number]  PCP:  Patient, No Pcp Per    No chief complaint on file. CAD   Wt Readings from Last 3 Encounters:  12/12/17 162 lb 4 oz (73.6 kg)  11/15/17 163 lb 12.8 oz (74.3 kg)  11/01/17 160 lb (72.6 kg)       History of Present Illness: Warren Salas is a 56 y.o. male  who presents to clinic for post hospital f/u after recent admission for acute STEMI. He has multiple cardiac risk factors including family h/o CAD (father with fatal MI at age 60, mother required CABG). Also with a h/o tobacco use, HTN, HLD and T2DM.  Pt presented to [facility] on 11/01/17 with chest pain and EKG c/w acute STEMI. Troponin level peaked at 19.6. He was taken to the cath lab for emergent LHC and was found to have a 100% occluded RCA, treated with thrombectomy + DES. Also noted to have a 75% mid LAD and 25% LCx, treated medically. EF was normal at 60-65%. He was placed on DAPT w/ ASA + Brilina, high dose statin therapy with Lipitor (LDL 48 mg/dL), Imdur, metoprolol and lisinopril. We were to consider repeat cath for intervention of the LAD if still with recurrent CP.   The patient was doing well in 11/18 at f/u with [facility].  Since then, her reports using SL NTG on several occasions, typically when he gets emotionally stressed.  He has decreased tobacco use, but has not quit completely.  He has taken his Brilinta regularly.  He is out of his other medications.  Denies : Chest pain. Dizziness. Leg edema. Orthopnea. Palpitations. Paroxysmal nocturnal dyspnea. Shortness of breath. Syncope.   He has a chronic cough.     Past Medical History:  Diagnosis Date  . Back pain   . COPD (chronic obstructive pulmonary disease) (HCC)   . Diabetes mellitus without complication (HCC)   . Hypertension     Past Surgical History:  Procedure Laterality Date  . APPENDECTOMY    . CORONARY/GRAFT ACUTE MI  REVASCULARIZATION N/A 11/01/2017   Procedure: Coronary/Graft Acute MI Revascularization;  Surgeon: Corky Crafts, MD;  Location: [facility] INVASIVE CV LAB;  Service: Cardiovascular;  Laterality: N/A;  . LEFT HEART CATH AND CORONARY ANGIOGRAPHY N/A 11/01/2017   Procedure: LEFT HEART CATH AND CORONARY ANGIOGRAPHY;  Surgeon: Corky Crafts, MD;  Location: [facility] INVASIVE CV LAB;  Service: Cardiovascular;  Laterality: N/A;     Current Outpatient Medications  Medication Sig Dispense Refill  . albuterol (PROVENTIL HFA;VENTOLIN HFA) 108 (90 Base) MCG/ACT inhaler Inhale 1-2 puffs into the lungs every 4 (four) hours as needed for wheezing or shortness of breath.     Marland Kitchen aspirin EC 81 MG tablet Take 81 mg by mouth daily.    Marland Kitchen atorvastatin (LIPITOR) 80 MG tablet Take 1 tablet (80 mg total) daily at 6 PM by mouth. 90 tablet 0  . budesonide-formoterol (SYMBICORT) 160-4.5 MCG/ACT inhaler Inhale 2 puffs into the lungs 2 (two) times daily as needed (For shortness of breath.).     Marland Kitchen isosorbide mononitrate (IMDUR) 30 MG 24 hr tablet Take 1 tablet (30 mg total) daily by mouth. 30 tablet 2  . lisinopril (PRINIVIL,ZESTRIL) 10 MG tablet Take 0.5 tablets (5 mg total) daily by mouth. 30 tablet 2  . metFORMIN (GLUCOPHAGE) 500 MG tablet Take 500 mg by mouth 2 (two) times daily.    . metoprolol  tartrate (LOPRESSOR) 50 MG tablet Take 1 tablet (50 mg total) 2 (two) times daily by mouth. 60 tablet 2  . nitroGLYCERIN (NITROSTAT) 0.4 MG SL tablet Place 1 tablet (0.4 mg total) every 5 (five) minutes as needed under the tongue. 25 tablet 2  . pregabalin (LYRICA) 50 MG capsule Take 50 mg by mouth 2 (two) times daily.    . ticagrelor (BRILINTA) 90 MG TABS tablet Take 1 tablet (90 mg total) 2 (two) times daily by mouth. 60 tablet 10   No current facility-administered medications for this visit.     Allergies:   Patient has no known allergies.    Social History:  The patient  reports that he has been smoking.  he has never used  smokeless tobacco. He reports that he drinks alcohol. He reports that he uses drugs.   Family History:  The patient's family history includes Diabetes in his father and mother; Hypertension in his father and mother.    ROS:  Please see the history of present illness.   Otherwise, review of systems are positive for unable to stop smoking.   All other systems are reviewed and negative.    PHYSICAL EXAM: VS:  BP 138/82   Pulse (!) 110   Ht 5\' 6"  (1.676 m)   Wt 162 lb 4 oz (73.6 kg)   SpO2 97%   BMI 26.19 kg/m  , BMI Body mass index is 26.19 kg/m. GEN: Well nourished, well developed, in no acute distress ; smells of tobacco HEENT: normal  Neck: no JVD, carotid bruits, or masses Cardiac: RRR; no murmurs, rubs, or gallops,no edema  Respiratory:  clear to auscultation bilaterally, normal work of breathing GI: soft, nontender, nondistended, + BS MS: no deformity or atrophy  Skin: warm and dry, no rash Neuro:  Strength and sensation are intact Psych: euthymic mood, full affect   EKG:   The ekg ordered today demonstrates NSR, inferior T wave inversions- no change from hospital ECG   Recent Labs: 11/04/2017: BUN <5; Creatinine, Ser 0.69; Hemoglobin 13.5; Platelets 146; Potassium 3.5; Sodium 136   Lipid Panel    Component Value Date/Time   CHOL 145 11/01/2017 1015   TRIG 100 11/01/2017 1015   HDL 77 11/01/2017 1015   CHOLHDL 1.9 11/01/2017 1015   VLDL 20 11/01/2017 1015   LDLCALC 48 11/01/2017 1015     Other studies Reviewed: Additional studies/ records that were reviewed today with results demonstrating: cath films reviewed, ulcerative LAD lesion noted..   ASSESSMENT AND PLAN:  1. CAD/Old MI: Episodic angina, treated with sublingual nitroglycerin.  He is trying excessive stress.  Given the ulcerative LAD lesion, I recommended PCI.  It is a long area to treat and may require 2 overlapping stents.  He prefers to wait until after the holidays.  We will schedule something for  early January.  COntinue DAPT.  All questions about the procedure were answered. 2. Tobacco abuse: I stressed the importance of stopping smoking. 3. DM: Continue metformin for blood sugar control. 4. He needs to find a PMD for preventive care.  A list of PMDs was given to him.     Current medicines are reviewed at length with the patient today.  The patient concerns regarding his medicines were addressed.  The following changes have been made:  No change  Labs/ tests ordered today include:  No orders of the defined types were placed in this encounter.   Recommend 150 minutes/week of aerobic exercise Low fat, low carb,  high fiber diet recommended  Disposition:   FU post cath   Signed, Lance MussJayadeep Dushaun Okey, MD  12/12/2017 2:01 PM    [facility] [address], [place], [place]  [address] Phone: [phone]; Fax: [phone]

## 2017-12-22 ENCOUNTER — Other Ambulatory Visit: Payer: Medicare Other | Admitting: *Deleted

## 2017-12-22 DIAGNOSIS — I25118 Atherosclerotic heart disease of native coronary artery with other forms of angina pectoris: Secondary | ICD-10-CM | POA: Diagnosis not present

## 2017-12-22 DIAGNOSIS — Z01812 Encounter for preprocedural laboratory examination: Secondary | ICD-10-CM

## 2017-12-23 LAB — CBC
HEMATOCRIT: 45.1 % (ref 37.5–51.0)
HEMOGLOBIN: 15.2 g/dL (ref 13.0–17.7)
MCH: 33.7 pg — AB (ref 26.6–33.0)
MCHC: 33.7 g/dL (ref 31.5–35.7)
MCV: 100 fL — AB (ref 79–97)
PLATELETS: 252 10*3/uL (ref 150–379)
RBC: 4.51 x10E6/uL (ref 4.14–5.80)
RDW: 12.8 % (ref 12.3–15.4)
WBC: 8.6 10*3/uL (ref 3.4–10.8)

## 2017-12-23 LAB — BASIC METABOLIC PANEL
BUN/Creatinine Ratio: 12 (ref 9–20)
BUN: 9 mg/dL (ref 6–24)
CO2: 24 mmol/L (ref 20–29)
CREATININE: 0.76 mg/dL (ref 0.76–1.27)
Calcium: 9.7 mg/dL (ref 8.7–10.2)
Chloride: 104 mmol/L (ref 96–106)
GFR calc Af Amer: 118 mL/min/{1.73_m2} (ref >59)
GFR, EST NON AFRICAN AMERICAN: 102 mL/min/{1.73_m2} (ref >59)
Glucose: 148 mg/dL — ABNORMAL HIGH (ref 65–99)
Potassium: 4.1 mmol/L (ref 3.5–5.2)
SODIUM: 143 mmol/L (ref 134–144)

## 2017-12-23 LAB — PROTIME-INR

## 2017-12-26 ENCOUNTER — Other Ambulatory Visit: Payer: Self-pay | Admitting: *Deleted

## 2017-12-26 DIAGNOSIS — Z01812 Encounter for preprocedural laboratory examination: Secondary | ICD-10-CM | POA: Diagnosis not present

## 2017-12-26 DIAGNOSIS — I25118 Atherosclerotic heart disease of native coronary artery with other forms of angina pectoris: Secondary | ICD-10-CM

## 2017-12-26 LAB — PROTIME-INR
INR: 1.1 (ref 0.8–1.2)
Prothrombin Time: 10.9 s (ref 9.1–12.0)

## 2017-12-29 ENCOUNTER — Encounter (HOSPITAL_COMMUNITY)
Admission: RE | Disposition: A | Discharge status: Home / Self Care | Payer: Self-pay | Source: Ambulatory Visit | Attending: Interventional Cardiology

## 2017-12-29 ENCOUNTER — Ambulatory Visit (HOSPITAL_COMMUNITY)
Admission: RE | Admit: 2017-12-29 | Discharge: 2017-12-29 | Disposition: A | Discharge status: Home / Self Care | Payer: Medicare Other | Source: Ambulatory Visit | Attending: Interventional Cardiology | Admitting: Interventional Cardiology

## 2017-12-29 DIAGNOSIS — Z79899 Other long term (current) drug therapy: Secondary | ICD-10-CM | POA: Diagnosis not present

## 2017-12-29 DIAGNOSIS — J449 Chronic obstructive pulmonary disease, unspecified: Secondary | ICD-10-CM | POA: Diagnosis not present

## 2017-12-29 DIAGNOSIS — Z833 Family history of diabetes mellitus: Secondary | ICD-10-CM | POA: Diagnosis not present

## 2017-12-29 DIAGNOSIS — Z7984 Long term (current) use of oral hypoglycemic drugs: Secondary | ICD-10-CM | POA: Diagnosis not present

## 2017-12-29 DIAGNOSIS — E119 Type 2 diabetes mellitus without complications: Secondary | ICD-10-CM | POA: Diagnosis not present

## 2017-12-29 DIAGNOSIS — I10 Essential (primary) hypertension: Secondary | ICD-10-CM | POA: Insufficient documentation

## 2017-12-29 DIAGNOSIS — I2510 Atherosclerotic heart disease of native coronary artery without angina pectoris: Secondary | ICD-10-CM | POA: Diagnosis present

## 2017-12-29 DIAGNOSIS — I252 Old myocardial infarction: Secondary | ICD-10-CM | POA: Insufficient documentation

## 2017-12-29 DIAGNOSIS — Z8249 Family history of ischemic heart disease and other diseases of the circulatory system: Secondary | ICD-10-CM | POA: Diagnosis not present

## 2017-12-29 DIAGNOSIS — Z7982 Long term (current) use of aspirin: Secondary | ICD-10-CM | POA: Insufficient documentation

## 2017-12-29 DIAGNOSIS — I25118 Atherosclerotic heart disease of native coronary artery with other forms of angina pectoris: Secondary | ICD-10-CM | POA: Insufficient documentation

## 2017-12-29 DIAGNOSIS — Z955 Presence of coronary angioplasty implant and graft: Secondary | ICD-10-CM | POA: Diagnosis not present

## 2017-12-29 DIAGNOSIS — F17200 Nicotine dependence, unspecified, uncomplicated: Secondary | ICD-10-CM | POA: Diagnosis not present

## 2017-12-29 HISTORY — PX: CORONARY STENT INTERVENTION: CATH118234

## 2017-12-29 LAB — GLUCOSE, CAPILLARY
GLUCOSE-CAPILLARY: 146 mg/dL — AB (ref 65–99)
Glucose-Capillary: 108 mg/dL — ABNORMAL HIGH (ref 65–99)

## 2017-12-29 LAB — POCT ACTIVATED CLOTTING TIME
Activated Clotting Time: 296 seconds
Activated Clotting Time: 345 seconds

## 2017-12-29 SURGERY — CORONARY STENT INTERVENTION
Anesthesia: LOCAL

## 2017-12-29 MED ORDER — SODIUM CHLORIDE 0.9% FLUSH: 3.0000 mL | Freq: Two times a day (BID) | INTRAVENOUS | Status: DC

## 2017-12-29 MED ORDER — MIDAZOLAM HCL 2 MG/2ML IJ SOLN
INTRAMUSCULAR | Status: AC
  Filled 2017-12-29: qty 2

## 2017-12-29 MED ORDER — SODIUM CHLORIDE 0.9% FLUSH: 3.0000 mL | INTRAVENOUS | Status: DC | PRN

## 2017-12-29 MED ORDER — HEPARIN SODIUM (PORCINE) 1000 UNIT/ML IJ SOLN
INTRAMUSCULAR | Status: DC | PRN
  Administered 2017-12-29: 8000 [IU] via INTRAVENOUS

## 2017-12-29 MED ORDER — ASPIRIN 81 MG PO CHEW
81.0000 mg | CHEWABLE_TABLET | ORAL | Status: AC
  Administered 2017-12-29: 81 mg via ORAL

## 2017-12-29 MED ORDER — NITROGLYCERIN 1 MG/10 ML FOR IR/CATH LAB
INTRA_ARTERIAL | Status: AC
  Filled 2017-12-29: qty 10

## 2017-12-29 MED ORDER — MIDAZOLAM HCL 2 MG/2ML IJ SOLN
INTRAMUSCULAR | Status: DC | PRN
Start: 2017-12-29 — End: 2017-12-29
  Administered 2017-12-29 (×3): 1 mg via INTRAVENOUS
  Administered 2017-12-29: 2 mg via INTRAVENOUS

## 2017-12-29 MED ORDER — ASPIRIN 81 MG PO CHEW
CHEWABLE_TABLET | ORAL | Status: AC
  Filled 2017-12-29: qty 1

## 2017-12-29 MED ORDER — SODIUM CHLORIDE 0.9 % WEIGHT BASED INFUSION: 1.0000 mL/kg/h | INTRAVENOUS | Status: DC

## 2017-12-29 MED ORDER — ONDANSETRON HCL 4 MG/2ML IJ SOLN: 4.0000 mg | Freq: Four times a day (QID) | INTRAMUSCULAR | Status: DC | PRN

## 2017-12-29 MED ORDER — ACETAMINOPHEN 325 MG PO TABS: 650.0000 mg | ORAL_TABLET | ORAL | Status: DC | PRN

## 2017-12-29 MED ORDER — FENTANYL CITRATE (PF) 100 MCG/2ML IJ SOLN
INTRAMUSCULAR | Status: AC
  Filled 2017-12-29: qty 2

## 2017-12-29 MED ORDER — SODIUM CHLORIDE 0.9 % IV SOLN: INTRAVENOUS | Status: AC

## 2017-12-29 MED ORDER — HEPARIN (PORCINE) IN NACL 2-0.9 UNIT/ML-% IJ SOLN
INTRAMUSCULAR | Status: AC | PRN
  Administered 2017-12-29: 1000 mL

## 2017-12-29 MED ORDER — NITROGLYCERIN 1 MG/10 ML FOR IR/CATH LAB
INTRA_ARTERIAL | Status: DC | PRN
  Administered 2017-12-29: 200 ug via INTRACORONARY

## 2017-12-29 MED ORDER — NITROGLYCERIN 0.4 MG SL SUBL: 0.4000 mg | SUBLINGUAL_TABLET | SUBLINGUAL | 2 refills | Status: DC | PRN

## 2017-12-29 MED ORDER — IOPAMIDOL (ISOVUE-370) INJECTION 76%
INTRAVENOUS | Status: AC
  Filled 2017-12-29: qty 50

## 2017-12-29 MED ORDER — HEPARIN SODIUM (PORCINE) 1000 UNIT/ML IJ SOLN
INTRAMUSCULAR | Status: AC
  Filled 2017-12-29: qty 1

## 2017-12-29 MED ORDER — HEPARIN (PORCINE) IN NACL 2-0.9 UNIT/ML-% IJ SOLN
INTRAMUSCULAR | Status: AC
  Filled 2017-12-29: qty 500

## 2017-12-29 MED ORDER — SODIUM CHLORIDE 0.9 % WEIGHT BASED INFUSION
3.0000 mL/kg/h | INTRAVENOUS | Status: DC
  Administered 2017-12-29: 3 mL/kg/h via INTRAVENOUS

## 2017-12-29 MED ORDER — LIDOCAINE HCL (PF) 1 % IJ SOLN
INTRAMUSCULAR | Status: AC
  Filled 2017-12-29: qty 30

## 2017-12-29 MED ORDER — VERAPAMIL HCL 2.5 MG/ML IV SOLN
INTRAVENOUS | Status: AC
  Filled 2017-12-29: qty 2

## 2017-12-29 MED ORDER — HYDRALAZINE HCL 20 MG/ML IJ SOLN: 5.0000 mg | INTRAMUSCULAR | Status: AC | PRN

## 2017-12-29 MED ORDER — FENTANYL CITRATE (PF) 100 MCG/2ML IJ SOLN
INTRAMUSCULAR | Status: DC | PRN
  Administered 2017-12-29 (×5): 25 ug via INTRAVENOUS

## 2017-12-29 MED ORDER — IOPAMIDOL (ISOVUE-370) INJECTION 76%
INTRAVENOUS | Status: DC | PRN
  Administered 2017-12-29: 100 mL via INTRA_ARTERIAL

## 2017-12-29 MED ORDER — LIDOCAINE HCL (PF) 1 % IJ SOLN
INTRAMUSCULAR | Status: DC | PRN
  Administered 2017-12-29: 2 mL

## 2017-12-29 MED ORDER — IOPAMIDOL (ISOVUE-370) INJECTION 76%
INTRAVENOUS | Status: AC
  Filled 2017-12-29: qty 100

## 2017-12-29 MED ORDER — VERAPAMIL HCL 2.5 MG/ML IV SOLN
INTRAVENOUS | Status: DC | PRN
  Administered 2017-12-29: 10 mL via INTRA_ARTERIAL

## 2017-12-29 MED ORDER — LABETALOL HCL 5 MG/ML IV SOLN
INTRAVENOUS | Status: AC
  Filled 2017-12-29: qty 4

## 2017-12-29 MED ORDER — TICAGRELOR 90 MG PO TABS
ORAL_TABLET | ORAL | Status: AC
  Filled 2017-12-29: qty 1

## 2017-12-29 MED ORDER — TICAGRELOR 90 MG PO TABS
90.0000 mg | ORAL_TABLET | Freq: Once | ORAL | Status: AC
  Administered 2017-12-29: 90 mg via ORAL

## 2017-12-29 MED ORDER — LABETALOL HCL 5 MG/ML IV SOLN
10.0000 mg | INTRAVENOUS | Status: AC | PRN
  Administered 2017-12-29: 10 mg via INTRAVENOUS

## 2017-12-29 MED ORDER — SODIUM CHLORIDE 0.9 % IV SOLN: 250.0000 mL | INTRAVENOUS | Status: DC | PRN

## 2017-12-29 SURGICAL SUPPLY — 23 items
BALLN EMERGE MR 2.75X15 (BALLOONS) ×3
BALLN WOLVERINE 2.75X10 (BALLOONS) ×3
BALLN ~~LOC~~ EMERGE MR 3.0X12 (BALLOONS) ×3
BALLN ~~LOC~~ EMERGE MR 3.5X12 (BALLOONS) ×3
BALLOON EMERGE MR 2.75X15 (BALLOONS) ×2 IMPLANT
BALLOON WOLVERINE 2.75X10 (BALLOONS) ×2 IMPLANT
BALLOON ~~LOC~~ EMERGE MR 3.0X12 (BALLOONS) ×2 IMPLANT
BALLOON ~~LOC~~ EMERGE MR 3.5X12 (BALLOONS) ×2 IMPLANT
CATH INFINITI JR4 5F (CATHETERS) ×3 IMPLANT
CATH LAUNCHER 6FR EBU 3 (CATHETERS) ×3 IMPLANT
DEVICE RAD COMP TR BAND LRG (VASCULAR PRODUCTS) ×3 IMPLANT
GLIDESHEATH SLEND SS 6F .021 (SHEATH) ×3 IMPLANT
GUIDEWIRE INQWIRE 1.5J.035X260 (WIRE) ×2 IMPLANT
INQWIRE 1.5J .035X260CM (WIRE) ×3
KIT ENCORE 26 ADVANTAGE (KITS) ×3 IMPLANT
KIT HEART LEFT (KITS) ×3 IMPLANT
PACK CARDIAC CATHETERIZATION (CUSTOM PROCEDURE TRAY) ×3 IMPLANT
STENT SYNERGY DES 2.75X28 (Permanent Stent) ×3 IMPLANT
STENT SYNERGY DES 3X20 (Permanent Stent) ×3 IMPLANT
TRANSDUCER W/STOPCOCK (MISCELLANEOUS) ×3 IMPLANT
TUBING CIL FLEX 10 FLL-RA (TUBING) ×3 IMPLANT
VALVE GUARDIAN II ~~LOC~~ HEMO (MISCELLANEOUS) ×3 IMPLANT
WIRE ASAHI PROWATER 180CM (WIRE) ×3 IMPLANT

## 2017-12-29 NOTE — Discharge Summary (Signed)
Discharge Summary    Patient ID: Warren ScarletBarton Salas,  MRN: [id number], DOB/AGE: 57-Oct-1962 57 y.o.  Admit date: 12/29/2017 Discharge date: 12/29/2017  Primary Care Provider: Patient, No Pcp Per Primary Cardiologist: Eldridge DaceVaranasi  Discharge Diagnoses    Active Problems:   CAD  Allergies No Known Allergies  Diagnostic Studies/Procedures    Cath: 12/29/17  Conclusion     Mid Cx lesion is 25% stenosed.  Previously placed Mid RCA drug eluting stent is widely patent.  Post Atrio lesion is 50% stenosed.  Prox LAD lesion is 90% stenosed.  A drug-eluting stent was successfully placed using Wolverine cutting balloon followed by a STENT SYNERGY DES 3X20, postdilated to 3.5 mm.  Post intervention, there is a 0% residual stenosis.  Mid LAD lesion is 75% stenosed.  A drug-eluting stent was successfully placed using a STENT SYNERGY DES 2.75X28, postdilated to 3 mm. Diagonal between stents was not jailed.  Post intervention, there is a 0% residual stenosis.  Normal LVEDP.   Continue dual antiplatelet therapy for 12 months along with aggressive secondary prevention.    Plan for same day PCI.  _____________   History of Present Illness     57 y.o. male who presented to clinic for post hospital f/u after recent admission for acute STEMI. He has multiple cardiac risk factors including family h/o CAD (father with fatal MI at age 57, mother required CABG). Also with a h/o tobacco use, HTN, HLD and T2DM.   Pt [facility]17 with chest pain and EKG c/w acute STEMI. Troponin level peaked at 19.6. He was taken to the cath lab for emergent LHC by Dr. Eldridge DaceVaranasi and was found to have a 100% occluded RCA, treated with thrombectomy + DES. Also noted to have a 75% mid LAD and 25% LCx, treated medically. EF was normal at 60-65%. He was placed on DAPT w/ ASA + Brilina, high dose statin therapy with Lipitor (LDL 48 mg/dL), Imdur, metoprolol and lisinopril. Dr. Eldridge DaceVaranasi noted to consider  repeat cath for intervention of the LAD if still with recurrent CP.   He presented back for post hospital f/u. He noted significant improvement in symptoms. No recurrent CP. No dyspnea. No exertional symptoms. He reports full med compliance. No missed doses of antiplatelets. No radial cath site complications. He was seen again in the office on 12/12/17 and reported intermittent episodes of SL nitro use. Given he had a ulcerative lesion in the LAD he was scheduled for PCI.   Hospital Course     Underwent cardiac cath with Dr. Eldridge DaceVaranasi noted above with successful PCI/DES x2 to the m/dLAD. Plan to continue on DAPT with ASA/Brilinta. No complications noted post cath. Seen by cardiac rehab. No complications noted post cath. Instructions/restrictions given prior to discharge. Right radial cath site stable.   Warren Salas was seen by Dr. Eldridge DaceVaranasi and determined stable for discharge home. Follow up in the office has been arranged. Medications are listed below.   _____________  Discharge Vitals Blood pressure (!) 144/84, pulse 76, temperature 97.8 F (36.6 C), temperature source Oral, resp. rate (!) 22, height (P) 5\' 6"  (1.676 m), weight (P) 161 lb (73 kg), SpO2 97 %.  Filed Weights   12/29/17 0829  Weight: (P) 161 lb (73 kg)    Labs & Radiologic Studies    CBC No results for input(s): WBC, NEUTROABS, HGB, HCT, MCV, PLT in the last 72 hours. Basic Metabolic Panel No results for input(s): NA, K, CL, CO2, GLUCOSE, BUN,  CREATININE, CALCIUM, MG, PHOS in the last 72 hours. Liver Function Tests No results for input(s): AST, ALT, ALKPHOS, BILITOT, PROT, ALBUMIN in the last 72 hours. No results for input(s): LIPASE, AMYLASE in the last 72 hours. Cardiac Enzymes No results for input(s): CKTOTAL, CKMB, CKMBINDEX, TROPONINI in the last 72 hours. BNP Invalid input(s): POCBNP D-Dimer No results for input(s): DDIMER in the last 72 hours. Hemoglobin A1C No results for input(s): HGBA1C in the last 72  hours. Fasting Lipid Panel No results for input(s): CHOL, HDL, LDLCALC, TRIG, CHOLHDL, LDLDIRECT in the last 72 hours. Thyroid Function Tests No results for input(s): TSH, T4TOTAL, T3FREE, THYROIDAB in the last 72 hours.  Invalid input(s): FREET3 _____________  No results found. Disposition   Pt is being discharged home today in good condition.  Follow-up Plans & Appointments    Follow-up Information    Corky Crafts, MD Follow up on 01/12/2018.   Specialties:  Cardiology, Radiology, Interventional Cardiology Why:  at 11:40am for your follow up appt.  Contact information: [address] [place] [address] [phone]          Discharge Instructions    Amb Referral to Cardiac Rehabilitation   Complete by:  As directed    Diagnosis:  Coronary Stents      Discharge Medications     Medication List    TAKE these medications   albuterol 108 (90 Base) MCG/ACT inhaler Commonly known as:  PROVENTIL HFA;VENTOLIN HFA Inhale 1-2 puffs into the lungs every 4 (four) hours as needed for wheezing or shortness of breath.   aspirin EC 81 MG tablet Take 81 mg by mouth daily.   atorvastatin 80 MG tablet Commonly known as:  LIPITOR Take 1 tablet (80 mg total) by mouth daily at 6 PM.   isosorbide mononitrate 30 MG 24 hr tablet Commonly known as:  IMDUR Take 1 tablet (30 mg total) by mouth daily.   lisinopril 10 MG tablet Commonly known as:  PRINIVIL,ZESTRIL Take 0.5 tablets (5 mg total) by mouth daily.   metFORMIN 500 MG tablet Commonly known as:  GLUCOPHAGE Take 500 mg by mouth 2 (two) times daily.   metoprolol tartrate 50 MG tablet Commonly known as:  LOPRESSOR Take 1 tablet (50 mg total) by mouth 2 (two) times daily.   nitroGLYCERIN 0.4 MG SL tablet Commonly known as:  NITROSTAT Place 1 tablet (0.4 mg total) under the tongue every 5 (five) minutes as needed.   SYMBICORT 160-4.5 MCG/ACT inhaler Generic drug:   budesonide-formoterol Inhale 2 puffs into the lungs 2 (two) times daily as needed (For shortness of breath.).   ticagrelor 90 MG Tabs tablet Commonly known as:  BRILINTA Take 1 tablet (90 mg total) by mouth 2 (two) times daily.        Aspirin prescribed at discharge?  Yes High Intensity Statin Prescribed? (Lipitor 40-80mg  or Crestor 20-40mg ): Yes Beta Blocker Prescribed? Yes For EF <40%, was ACEI/ARB Prescribed? Yes ADP Receptor Inhibitor Prescribed? (i.e. Plavix etc.-Includes Medically Managed Patients): Yes For EF <40%, Aldosterone Inhibitor Prescribed? No: EF ok Was EF assessed during THIS hospitalization? No: Recent echo Was Cardiac Rehab II ordered? (Included Medically managed Patients): Yes   Outstanding Labs/Studies   N/a   Duration of Discharge Encounter   Greater than 30 minutes including physician time.  Signed, Laverda Page NP-C 12/29/2017, 4:52 PM   I have examined the patient and reviewed assessment and plan and discussed with patient.  Agree with above as stated.  S/p  PCI of the LAD.  Cutting balloon for proximal LAD.  Mid LAD looked hypodense and was covered with a long stent.  Continue DAPT and secondary prevention including smoking cessation.  Radial precautions given.    Lance Muss

## 2017-12-29 NOTE — Discharge Instructions (Signed)
Radial Site Care °Refer to this sheet in the next few weeks. These instructions provide you with information about caring for yourself after your procedure. Your health care provider may also give you more specific instructions. Your treatment has been planned according to current medical practices, but problems sometimes occur. Call your health care provider if you have any problems or questions after your procedure. °What can I expect after the procedure? °After your procedure, it is typical to have the following: °· Bruising at the radial site that usually fades within 1-2 weeks. °· Blood collecting in the tissue (hematoma) that may be painful to the touch. It should usually decrease in size and tenderness within 1-2 weeks. ° °Follow these instructions at home: °· Take medicines only as directed by your health care provider. °· You may shower 24-48 hours after the procedure or as directed by your health care provider. Remove the bandage (dressing) and gently wash the site with plain soap and water. Pat the area dry with a clean towel. Do not rub the site, because this may cause bleeding. °· Do not take baths, swim, or use a hot tub until your health care provider approves. °· Check your insertion site every day for redness, swelling, or drainage. °· Do not apply powder or lotion to the site. °· Do not flex or bend the affected arm for 24 hours or as directed by your health care provider. °· Do not push or pull heavy objects with the affected arm for 24 hours or as directed by your health care provider. °· Do not lift over 10 lb (4.5 kg) for 5 days after your procedure or as directed by your health care provider. °· Ask your health care provider when it is okay to: °? Return to work or school. °? Resume usual physical activities or sports. °? Resume sexual activity. °· Do not drive home if you are discharged the same day as the procedure. Have someone else drive you. °· You may drive 24 hours after the procedure  unless otherwise instructed by your health care provider. °· Do not operate machinery or power tools for 24 hours after the procedure. °· If your procedure was done as an outpatient procedure, which means that you went home the same day as your procedure, a responsible adult should be with you for the first 24 hours after you arrive home. °· Keep all follow-up visits as directed by your health care provider. This is important. °Contact a health care provider if: °· You have a fever. °· You have chills. °· You have increased bleeding from the radial site. Hold pressure on the site. CALL 911 °Get help right away if: °· You have unusual pain at the radial site. °· You have redness, warmth, or swelling at the radial site. °· You have drainage (other than a small amount of blood on the dressing) from the radial site. °· The radial site is bleeding, and the bleeding does not stop after 30 minutes of holding steady pressure on the site. °· Your arm or hand becomes pale, cool, tingly, or numb. °This information is not intended to replace advice given to you by your health care provider. Make sure you discuss any questions you have with your health care provider. °Document Released: 01/15/2011 Document Revised: 05/20/2016 Document Reviewed: 07/01/2014 °Elsevier Interactive Patient Education © 2018 [employer] ° ° °Moderate Conscious Sedation, Adult, Care After °These instructions provide you with information about caring for yourself after your procedure. Your health care   provider may also give you more specific instructions. Your treatment has been planned according to current medical practices, but problems sometimes occur. Call your health care provider if you have any problems or questions after your procedure. °What can I expect after the procedure? °After your procedure, it is common: °· To feel sleepy for several hours. °· To feel clumsy and have poor balance for several hours. °· To have poor judgment for several  hours. °· To vomit if you eat too soon. ° °Follow these instructions at home: °For at least 24 hours after the procedure: ° °· Do not: °? Participate in activities where you could fall or become injured. °? Drive. °? Use heavy machinery. °? Drink alcohol. °? Take sleeping pills or medicines that cause drowsiness. °? Make important decisions or sign legal documents. °? Take care of children on your own. °· Rest. °Eating and drinking °· Follow the diet recommended by your health care provider. °· If you vomit: °? Drink water, juice, or soup when you can drink without vomiting. °? Make sure you have little or no nausea before eating solid foods. °General instructions °· Have a responsible adult stay with you until you are awake and alert. °· Take over-the-counter and prescription medicines only as told by your health care provider. °· If you smoke, do not smoke without supervision. °· Keep all follow-up visits as told by your health care provider. This is important. °Contact a health care provider if: °· You keep feeling nauseous or you keep vomiting. °· You feel light-headed. °· You develop a rash. °· You have a fever. °Get help right away if: °· You have trouble breathing. °This information is not intended to replace advice given to you by your health care provider. Make sure you discuss any questions you have with your health care provider. °Document Released: 10/03/2013 Document Revised: 05/17/2016 Document Reviewed: 04/03/2016 °Elsevier Interactive Patient Education © 2018 Elsevier Inc[employer] ° ° °

## 2017-12-29 NOTE — Interval H&P Note (Signed)
Cath Lab Visit (complete for each Cath Lab visit)  Clinical Evaluation Leading to the Procedure:   ACS: No.  Non-ACS:    Anginal Classification: CCS III  Anti-ischemic medical therapy: Minimal Therapy (1 class of medications)  Non-Invasive Test Results: No non-invasive testing performed  Prior CABG: No previous CABG  Recurrent CP post PCI for inferior STEMI.    History and Physical Interval Note:  12/29/2017 10:07 AM  Warren Salas  has presented today for surgery, with the diagnosis of cad  The various methods of treatment have been discussed with the patient and family. After consideration of risks, benefits and other options for treatment, the patient has consented to  Procedure(s): CORONARY STENT INTERVENTION - LAD (N/A) as a surgical intervention .  The patient's history has been reviewed, patient examined, no change in status, stable for surgery.  I have reviewed the patient's chart and labs.  Questions were answered to the patient's satisfaction.     Lance MussJayadeep Mina Babula

## 2017-12-29 NOTE — Progress Notes (Signed)
[id number] Reinforced ed as I saw pt when he was here in November. He has not been able to quit smoking entirely. Stated he has decreased. Will discuss with cardiologist next visit nicotine patches or po meds that might be appropriate. Reviewed NTG use, brilinta use with stent, walking as tolerated, and heart healthy food choices. Will send referral to [place] as pt would like to consider attending since staged PCI done. Luetta Nuttingharlene Jazalyn Mondor RN BSN 12/29/2017 2:28 PM

## 2017-12-30 ENCOUNTER — Encounter (HOSPITAL_COMMUNITY): Payer: Self-pay | Admitting: Interventional Cardiology

## 2018-01-06 ENCOUNTER — Telehealth (HOSPITAL_COMMUNITY): Payer: Self-pay

## 2018-01-06 NOTE — Telephone Encounter (Signed)
Patients insurance is active and benefits verified through Medicare Part A & B - No co-pay, deductible amount of $185.00/$0.00 has been met, no out of pocket, 20% co-insurance, and no pre-authorization is required. Passport/reference [phone]  Patients insurance is active and benefits verified through Medicaid - No co-pay, no deductible, no out of pocket, no co-insurance, and no pre-authorization is required. Passport/reference [phone]  Patient will be contacted and scheduled after review by the RN Navigator.

## 2018-01-06 NOTE — Telephone Encounter (Signed)
Faxed over Medicaid Reimbursement form to Dr.Varanasi to fill, sign, and complete. Patient's paperwork is in fax waiting folder.

## 2018-01-11 NOTE — Progress Notes (Signed)
Cardiology Office Note   Date:  01/12/2018   ID:  Warren ScarletBarton Marvin, DOB 28-Nov-1961, MRN [id number]  PCP:  Patient, No Pcp Per    No chief complaint on file. CAD   Wt Readings from Last 3 Encounters:  01/12/18 172 lb 12.8 oz (78.4 kg)  12/29/17 (P) 161 lb (73 kg)  12/12/17 162 lb 4 oz (73.6 kg)       History of Present Illness: Warren Salas is a 57 y.o. male   who presents to clinic for post hospital f/u after recent admission for acute STEMI. He has multiple cardiac risk factors including family h/o CAD (father with fatal MI at age 57, mother required CABG). Also with a h/o tobacco use, HTN, HLD and T2DM.  Pt [facility]17 with chest pain and EKG c/w acute STEMI. Troponin level peaked at 19.6. He was taken to the cath lab for emergent LHC and was found to have a 100% occluded RCA, treated with thrombectomy + DES. Also noted to have a 75% mid LAD and 25% LCx, treated medically. EF was normal at 60-65%. He was placed on DAPT w/ ASA + Brilina, high dose statin therapy with Lipitor (LDL 48 mg/dL), Imdur, metoprolol and lisinopril.  He had more angina and subsequnently had PCI of the LAD in 1/19.   Since then, he has done well.  Denies : Chest pain. Dizziness. Leg edema. Nitroglycerin use. Orthopnea. Palpitations. Paroxysmal nocturnal dyspnea. Shortness of breath. Syncope.   He still smokes.  He is cutting back.    He is not doing cardiac rehab.  He tries to walk on a daily basis.        Past Medical History:  Diagnosis Date  . Back pain   . COPD (chronic obstructive pulmonary disease) (HCC)   . Diabetes mellitus without complication (HCC)   . Hypertension     Past Surgical History:  Procedure Laterality Date  . APPENDECTOMY    . CORONARY STENT INTERVENTION N/A 12/29/2017   Procedure: CORONARY STENT INTERVENTION - LAD;  Surgeon: Corky CraftsVaranasi, Arria Naim S, MD;  [facility]: [facility];  Service: Cardiovascular;  Laterality: N/A;  . CORONARY/GRAFT ACUTE MI  REVASCULARIZATION N/A 11/01/2017   Procedure: Coronary/Graft Acute MI Revascularization;  Surgeon: Corky CraftsVaranasi, Shammond Arave S, MD;  [facility];  Service: Cardiovascular;  Laterality: N/A;  . LEFT HEART CATH AND CORONARY ANGIOGRAPHY N/A 11/01/2017   Procedure: LEFT HEART CATH AND CORONARY ANGIOGRAPHY;  Surgeon: Corky CraftsVaranasi, Sonyia Muro S, MD;  [facility];  Service: Cardiovascular;  Laterality: N/A;     Current Outpatient Medications  Medication Sig Dispense Refill  . albuterol (PROVENTIL HFA;VENTOLIN HFA) 108 (90 Base) MCG/ACT inhaler Inhale 1-2 puffs into the lungs every 4 (four) hours as needed for wheezing or shortness of breath.     Marland Kitchen. aspirin EC 81 MG tablet Take 81 mg by mouth daily.    Marland Kitchen. atorvastatin (LIPITOR) 80 MG tablet Take 1 tablet (80 mg total) by mouth daily at 6 PM. 90 tablet 3  . budesonide-formoterol (SYMBICORT) 160-4.5 MCG/ACT inhaler Inhale 2 puffs into the lungs 2 (two) times daily as needed (For shortness of breath.).     Marland Kitchen. isosorbide mononitrate (IMDUR) 30 MG 24 hr tablet Take 1 tablet (30 mg total) by mouth daily. 90 tablet 3  . lisinopril (PRINIVIL,ZESTRIL) 10 MG tablet Take 0.5 tablets (5 mg total) by mouth daily. 45 tablet 3  . metFORMIN (GLUCOPHAGE) 500 MG tablet Take 500 mg by  mouth 2 (two) times daily.    . metoprolol tartrate (LOPRESSOR) 50 MG tablet Take 1 tablet (50 mg total) by mouth 2 (two) times daily. 180 tablet 3  . nitroGLYCERIN (NITROSTAT) 0.4 MG SL tablet Place 1 tablet (0.4 mg total) under the tongue every 5 (five) minutes as needed. 25 tablet 2  . ticagrelor (BRILINTA) 90 MG TABS tablet Take 1 tablet (90 mg total) by mouth 2 (two) times daily. 180 tablet 3   No current facility-administered medications for this visit.     Allergies:   Patient has no known allergies.    Social History:  The patient  reports that he has been smoking.  he has never used smokeless tobacco. He reports that he drinks alcohol. He reports that he uses  drugs.   Family History:  The patient's family history includes Diabetes in his father and mother; Hypertension in his father and mother.    ROS:  Please see the history of present illness.   Otherwise, review of systems are positive for inability to stop smoking.   All other systems are reviewed and negative.    PHYSICAL EXAM: VS:  BP 132/78   Pulse 92   Ht 5\' 6"  (1.676 m)   Wt 172 lb 12.8 oz (78.4 kg)   SpO2 97%   BMI 27.89 kg/m  , BMI Body mass index is 27.89 kg/m. GEN: Well nourished, well developed, in no acute distress  HEENT: normal  Neck: no JVD, carotid bruits, or masses Cardiac: RRR; no murmurs, rubs, or gallops,no edema  Respiratory:  clear to auscultation bilaterally, normal work of breathing GI: soft, nontender, nondistended, + BS MS: no deformity or atrophy ; 2+ right radial pulse Skin: warm and dry, no rash Neuro:  Strength and sensation are intact Psych: euthymic mood, full affect    Recent Labs: 12/22/2017: BUN 9; Creatinine, Ser 0.76; Hemoglobin 15.2; Platelets 252; Potassium 4.1; Sodium 143   Lipid Panel    Component Value Date/Time   CHOL 145 11/01/2017 1015   TRIG 100 11/01/2017 1015   HDL 77 11/01/2017 1015   CHOLHDL 1.9 11/01/2017 1015   VLDL 20 11/01/2017 1015   LDLCALC 48 11/01/2017 1015     Other studies Reviewed: Additional studies/ records that were reviewed today with results demonstrating: 2 DES to the LAD in early Jan 2019.   ASSESSMENT AND PLAN:  1. CAD/Old MI: No angina.  Continue aggressive secondary prevention.  I stressed the importance of DAPT.   2. Tobacco abuse: He is trying to cut back.  I encouraged him to try to quit smoking.   3. DM: A1C 6.1 in 11/18.  Continue healthy diet and increase exercise as noted below.    Current medicines are reviewed at length with the patient today.  The patient concerns regarding his medicines were addressed.  The following changes have been made:  No change  Labs/ tests ordered today  include:  No orders of the defined types were placed in this encounter.   Recommend 150 minutes/week of aerobic exercise Low fat, low carb, high fiber diet recommended  Disposition:   FU in 6 months   Signed, Lance Muss, MD  01/12/2018 11:59 AM    [facility] [address] [place], [place], [place]  [address] Phone: [phone]; Fax: [phone]

## 2018-01-12 ENCOUNTER — Ambulatory Visit: Payer: Medicare Other | Admitting: Cardiology

## 2018-01-12 ENCOUNTER — Ambulatory Visit (INDEPENDENT_AMBULATORY_CARE_PROVIDER_SITE_OTHER): Payer: Medicare Other | Admitting: Interventional Cardiology

## 2018-01-12 ENCOUNTER — Encounter: Payer: Self-pay | Admitting: Interventional Cardiology

## 2018-01-12 VITALS — BP 132/78 | HR 92 | Ht 66.0 in | Wt 172.8 lb

## 2018-01-12 DIAGNOSIS — Z955 Presence of coronary angioplasty implant and graft: Secondary | ICD-10-CM

## 2018-01-12 DIAGNOSIS — I252 Old myocardial infarction: Secondary | ICD-10-CM

## 2018-01-12 DIAGNOSIS — E118 Type 2 diabetes mellitus with unspecified complications: Secondary | ICD-10-CM | POA: Diagnosis not present

## 2018-01-12 DIAGNOSIS — I2510 Atherosclerotic heart disease of native coronary artery without angina pectoris: Secondary | ICD-10-CM

## 2018-01-12 DIAGNOSIS — Z720 Tobacco use: Secondary | ICD-10-CM

## 2018-01-12 NOTE — Patient Instructions (Signed)

## 2018-01-26 ENCOUNTER — Ambulatory Visit: Payer: Medicare Other | Admitting: Interventional Cardiology

## 2018-07-06 ENCOUNTER — Emergency Department (HOSPITAL_COMMUNITY)
Admission: EM | Admit: 2018-07-06 | Discharge: 2018-07-06 | Disposition: A | Discharge status: Home / Self Care | Payer: Medicare Other | Attending: Emergency Medicine | Admitting: Emergency Medicine

## 2018-07-06 ENCOUNTER — Encounter (HOSPITAL_COMMUNITY): Payer: Self-pay | Admitting: *Deleted

## 2018-07-06 ENCOUNTER — Other Ambulatory Visit: Payer: Self-pay

## 2018-07-06 DIAGNOSIS — I2510 Atherosclerotic heart disease of native coronary artery without angina pectoris: Secondary | ICD-10-CM | POA: Insufficient documentation

## 2018-07-06 DIAGNOSIS — G8929 Other chronic pain: Secondary | ICD-10-CM | POA: Diagnosis not present

## 2018-07-06 DIAGNOSIS — F17210 Nicotine dependence, cigarettes, uncomplicated: Secondary | ICD-10-CM | POA: Diagnosis not present

## 2018-07-06 DIAGNOSIS — M545 Low back pain: Secondary | ICD-10-CM | POA: Insufficient documentation

## 2018-07-06 DIAGNOSIS — Z79899 Other long term (current) drug therapy: Secondary | ICD-10-CM | POA: Diagnosis not present

## 2018-07-06 DIAGNOSIS — J449 Chronic obstructive pulmonary disease, unspecified: Secondary | ICD-10-CM | POA: Insufficient documentation

## 2018-07-06 DIAGNOSIS — Z7984 Long term (current) use of oral hypoglycemic drugs: Secondary | ICD-10-CM | POA: Diagnosis not present

## 2018-07-06 DIAGNOSIS — E119 Type 2 diabetes mellitus without complications: Secondary | ICD-10-CM | POA: Insufficient documentation

## 2018-07-06 DIAGNOSIS — I10 Essential (primary) hypertension: Secondary | ICD-10-CM | POA: Diagnosis not present

## 2018-07-06 HISTORY — DX: ST elevation (STEMI) myocardial infarction of unspecified site: I21.3

## 2018-07-06 MED ORDER — CYCLOBENZAPRINE HCL 10 MG PO TABS: 10.0000 mg | ORAL_TABLET | Freq: Two times a day (BID) | ORAL | 0 refills | Status: AC | PRN

## 2018-07-06 MED ORDER — KETOROLAC TROMETHAMINE 30 MG/ML IJ SOLN
30.0000 mg | Freq: Once | INTRAMUSCULAR | Status: AC
  Administered 2018-07-06: 30 mg via INTRAMUSCULAR
  Filled 2018-07-06: qty 1

## 2018-07-06 MED ORDER — CYCLOBENZAPRINE HCL 10 MG PO TABS
10.0000 mg | ORAL_TABLET | Freq: Once | ORAL | Status: AC
  Administered 2018-07-06: 10 mg via ORAL
  Filled 2018-07-06: qty 1

## 2018-07-06 NOTE — Discharge Instructions (Addendum)
I'm glad we were able to have some pain relief with the medicine I gave you today. I have prescribed you with Flexeril, a muscle relaxer, that you can take if you experience additional muscle spasms. You may also take Tylenol too for additional pain relief. Heating pads may work as well.  I have included information on the [facility] that you can follow-up with for primary care and medication refills.
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
## 2018-07-06 NOTE — ED Notes (Signed)
Patient verbalizes understanding of discharge instructions. Opportunity for questioning and answers were provided. Armband removed by staff, pt discharged from ED.  

## 2018-07-06 NOTE — ED Triage Notes (Signed)
Pt c/o bil lower back pain onset x 2 days, denies injury, pt states, "I have a misaligned spine." pt denies bowel and bladder Incontinence, denies urinary symptoms, A&O x4

## 2018-07-06 NOTE — ED Notes (Signed)
EDP at bedside  

## 2018-10-10 ENCOUNTER — Emergency Department (HOSPITAL_COMMUNITY): Payer: Medicare Other

## 2018-10-10 ENCOUNTER — Emergency Department (HOSPITAL_COMMUNITY)
Admission: EM | Admit: 2018-10-10 | Discharge: 2018-10-11 | Disposition: A | Discharge status: Home / Self Care | Payer: Medicare Other | Attending: Emergency Medicine | Admitting: Emergency Medicine

## 2018-10-10 ENCOUNTER — Encounter (HOSPITAL_COMMUNITY): Payer: Self-pay | Admitting: Emergency Medicine

## 2018-10-10 DIAGNOSIS — E119 Type 2 diabetes mellitus without complications: Secondary | ICD-10-CM | POA: Insufficient documentation

## 2018-10-10 DIAGNOSIS — Z7984 Long term (current) use of oral hypoglycemic drugs: Secondary | ICD-10-CM | POA: Insufficient documentation

## 2018-10-10 DIAGNOSIS — R0789 Other chest pain: Secondary | ICD-10-CM | POA: Diagnosis not present

## 2018-10-10 DIAGNOSIS — Z79899 Other long term (current) drug therapy: Secondary | ICD-10-CM | POA: Insufficient documentation

## 2018-10-10 DIAGNOSIS — Z7982 Long term (current) use of aspirin: Secondary | ICD-10-CM | POA: Insufficient documentation

## 2018-10-10 DIAGNOSIS — I10 Essential (primary) hypertension: Secondary | ICD-10-CM | POA: Diagnosis not present

## 2018-10-10 DIAGNOSIS — R000 Tachycardia, unspecified: Secondary | ICD-10-CM | POA: Diagnosis not present

## 2018-10-10 DIAGNOSIS — J40 Bronchitis, not specified as acute or chronic: Secondary | ICD-10-CM | POA: Insufficient documentation

## 2018-10-10 DIAGNOSIS — F1490 Cocaine use, unspecified, uncomplicated: Secondary | ICD-10-CM

## 2018-10-10 DIAGNOSIS — F10120 Alcohol abuse with intoxication, uncomplicated: Secondary | ICD-10-CM | POA: Insufficient documentation

## 2018-10-10 DIAGNOSIS — J449 Chronic obstructive pulmonary disease, unspecified: Secondary | ICD-10-CM | POA: Insufficient documentation

## 2018-10-10 DIAGNOSIS — R079 Chest pain, unspecified: Secondary | ICD-10-CM | POA: Diagnosis not present

## 2018-10-10 DIAGNOSIS — F1410 Cocaine abuse, uncomplicated: Secondary | ICD-10-CM | POA: Insufficient documentation

## 2018-10-10 DIAGNOSIS — F17210 Nicotine dependence, cigarettes, uncomplicated: Secondary | ICD-10-CM | POA: Diagnosis not present

## 2018-10-10 DIAGNOSIS — F10920 Alcohol use, unspecified with intoxication, uncomplicated: Secondary | ICD-10-CM

## 2018-10-10 DIAGNOSIS — J9809 Other diseases of bronchus, not elsewhere classified: Secondary | ICD-10-CM | POA: Diagnosis not present

## 2018-10-10 LAB — CBC
HCT: 45.3 % (ref 39.0–52.0)
Hemoglobin: 14.8 g/dL (ref 13.0–17.0)
MCH: 34.2 pg — AB (ref 26.0–34.0)
MCHC: 32.7 g/dL (ref 30.0–36.0)
MCV: 104.6 fL — ABNORMAL HIGH (ref 80.0–100.0)
PLATELETS: 235 10*3/uL (ref 150–400)
RBC: 4.33 MIL/uL (ref 4.22–5.81)
RDW: 12.6 % (ref 11.5–15.5)
WBC: 10.8 10*3/uL — ABNORMAL HIGH (ref 4.0–10.5)
nRBC: 0 % (ref 0.0–0.2)

## 2018-10-10 LAB — BASIC METABOLIC PANEL
Anion gap: 19 — ABNORMAL HIGH (ref 5–15)
BUN: 6 mg/dL (ref 6–20)
CALCIUM: 9.1 mg/dL (ref 8.9–10.3)
CO2: 17 mmol/L — ABNORMAL LOW (ref 22–32)
CREATININE: 0.69 mg/dL (ref 0.61–1.24)
Chloride: 92 mmol/L — ABNORMAL LOW (ref 98–111)
GFR calc Af Amer: >60 mL/min (ref >60)
GFR calc non Af Amer: >60 mL/min (ref >60)
GLUCOSE: 103 mg/dL — AB (ref 70–99)
Potassium: 3.6 mmol/L (ref 3.5–5.1)
Sodium: 128 mmol/L — ABNORMAL LOW (ref 135–145)

## 2018-10-10 LAB — HEPATIC FUNCTION PANEL
ALK PHOS: 76 U/L (ref 38–126)
ALT: 17 U/L (ref 0–44)
AST: 24 U/L (ref 15–41)
Albumin: 3.9 g/dL (ref 3.5–5.0)
BILIRUBIN DIRECT: 0.2 mg/dL (ref 0.0–0.2)
BILIRUBIN INDIRECT: 0.6 mg/dL (ref 0.3–0.9)
TOTAL PROTEIN: 7 g/dL (ref 6.5–8.1)
Total Bilirubin: 0.8 mg/dL (ref 0.3–1.2)

## 2018-10-10 LAB — URINALYSIS, ROUTINE W REFLEX MICROSCOPIC
BILIRUBIN URINE: NEGATIVE
Glucose, UA: NEGATIVE mg/dL
HGB URINE DIPSTICK: NEGATIVE
Ketones, ur: 5 mg/dL — AB
Leukocytes, UA: NEGATIVE
Nitrite: NEGATIVE
PROTEIN: NEGATIVE mg/dL
Specific Gravity, Urine: 1.002 — ABNORMAL LOW (ref 1.005–1.030)
pH: 5 (ref 5.0–8.0)

## 2018-10-10 LAB — ETHANOL: Alcohol, Ethyl (B): 174 mg/dL — ABNORMAL HIGH (ref <10)

## 2018-10-10 LAB — RAPID URINE DRUG SCREEN, HOSP PERFORMED
Amphetamines: NOT DETECTED
BARBITURATES: NOT DETECTED
BENZODIAZEPINES: NOT DETECTED
Cocaine: POSITIVE — AB
Opiates: NOT DETECTED
Tetrahydrocannabinol: NOT DETECTED

## 2018-10-10 LAB — I-STAT TROPONIN, ED: Troponin i, poc: 0.01 ng/mL (ref 0.00–0.08)

## 2018-10-10 LAB — LIPASE, BLOOD: Lipase: 28 U/L (ref 11–51)

## 2018-10-10 LAB — D-DIMER, QUANTITATIVE (NOT AT ARMC): D DIMER QUANT: 4.24 ug{FEU}/mL — AB (ref 0.00–0.50)

## 2018-10-10 MED ORDER — THIAMINE HCL 100 MG/ML IJ SOLN
100.0000 mg | Freq: Once | INTRAMUSCULAR | Status: AC
  Administered 2018-10-11: 100 mg via INTRAVENOUS
  Filled 2018-10-10: qty 2

## 2018-10-10 MED ORDER — FOLIC ACID 5 MG/ML IJ SOLN
1.0000 mg | Freq: Every day | INTRAMUSCULAR | Status: DC
  Administered 2018-10-11: 1 mg via INTRAVENOUS
  Filled 2018-10-10 (×2): qty 0.2

## 2018-10-10 MED ORDER — SODIUM CHLORIDE 0.9 % IV BOLUS
1000.0000 mL | Freq: Once | INTRAVENOUS | Status: AC
  Administered 2018-10-11: 1000 mL via INTRAVENOUS

## 2018-10-10 NOTE — ED Triage Notes (Signed)
Ems chest pain. Pt for the past two days has had chest pain in center chest that  hurts with  inspiration and palpation coughing makes it worse. Ems gave pt 1 nitro, 324 asa, pain went from a  9 to a 7 but bp  dropped due to nitro but has come up. Pt states last drink was 10 hours ago.

## 2018-10-10 NOTE — ED Provider Notes (Signed)
[facility] EMERGENCY DEPARTMENT Provider Note   CSN: [id number] Arrival date & time: 10/10/18  2052     History   Chief Complaint Chief Complaint  Patient presents with  . Chest Pain    HPI Warren Salas is a 57 y.o. male with history of STEMI status post thrombectomy and DES, hypertension, hyperlipidemia, diabetes, tobacco use, alcohol use disorder here for evaluation of chest pain.  Onset 2 to 3 days ago.  Sudden, gradually worsening and more frequent.  States it feels similar to his previous heart attack.  Initially thought it was indigestion.  The pain is substernal, occurring randomly, every 2 to 3 seconds, sharp like a stabbing knife.  CP lasts a few seconds and goes away on its own.  CP happens at rest but also with coughing, taking deep breaths, palpation.  Associated with worsening productive cough, nausea, vomiting. Admits to drinking beer daily, 2-3 earlier today.  No interventions PTA. No alleviating factors. Denies illicit drug use. No h/o DVT/PE. Level 5 caveat due to acute intoxication. States he has been taking all his medicine but cannot tell me the names or how many.   HPI  Past Medical History:  Diagnosis Date  . Back pain   . COPD (chronic obstructive pulmonary disease) (HCC)   . Diabetes mellitus without complication (HCC)   . Hypertension   . STEMI (ST elevation myocardial infarction) (HCC) 10/2017    Patient Active Problem List   Diagnosis Date Noted  . CAD (coronary artery disease) 12/12/2017  . Old MI (myocardial infarction) 12/12/2017  . Tobacco abuse 12/12/2017  . Type 2 diabetes mellitus with complication, without long-term current use of insulin (HCC) 12/12/2017  . Acute MI, inferior wall (HCC) 11/01/2017  . Acute inferior myocardial infarction [facility])     Past Surgical History:  Procedure Laterality Date  . APPENDECTOMY    . CORONARY STENT INTERVENTION N/A 12/29/2017   Procedure: CORONARY STENT INTERVENTION - LAD;  Surgeon:  Corky Crafts, MD;  Location: [facility] INVASIVE CV LAB;  Service: Cardiovascular;  Laterality: N/A;  . CORONARY/GRAFT ACUTE MI REVASCULARIZATION N/A 11/01/2017   Procedure: Coronary/Graft Acute MI Revascularization;  Surgeon: Corky Crafts, MD;  Location: [facility] INVASIVE CV LAB;  Service: Cardiovascular;  Laterality: N/A;  . LEFT HEART CATH AND CORONARY ANGIOGRAPHY N/A 11/01/2017   Procedure: LEFT HEART CATH AND CORONARY ANGIOGRAPHY;  Surgeon: Corky Crafts, MD;  Location: [facility] INVASIVE CV LAB;  Service: Cardiovascular;  Laterality: N/A;        Home Medications    Prior to Admission medications   Medication Sig Start Date End Date Taking? Authorizing Provider  albuterol (PROVENTIL HFA;VENTOLIN HFA) 108 (90 Base) MCG/ACT inhaler Inhale 1-2 puffs into the lungs every 4 (four) hours as needed for wheezing or shortness of breath.  05/07/13   [provider]  aspirin EC 81 MG tablet Take 81 mg by mouth daily. 05/13/15   [provider]  atorvastatin (LIPITOR) 80 MG tablet Take 1 tablet (80 mg total) by mouth daily at 6 PM. 12/12/17   Corky Crafts, MD  budesonide-formoterol [facility]) 160-4.5 MCG/ACT inhaler Inhale 2 puffs into the lungs 2 (two) times daily as needed (For shortness of breath.).     [provider]  isosorbide mononitrate (IMDUR) 30 MG 24 hr tablet Take 1 tablet (30 mg total) by mouth daily. 12/12/17   Corky Crafts, MD  lisinopril (PRINIVIL,ZESTRIL) 10 MG tablet Take 0.5 tablets (5 mg total) by mouth daily. 12/12/17  Corky Crafts, MD  metFORMIN (GLUCOPHAGE) 500 MG tablet Take 500 mg by mouth 2 (two) times daily. 01/26/16   [provider]  metoprolol tartrate (LOPRESSOR) 50 MG tablet Take 1 tablet (50 mg total) by mouth 2 (two) times daily. 12/12/17   Corky Crafts, MD  nitroGLYCERIN (NITROSTAT) 0.4 MG SL tablet Place 1 tablet (0.4 mg total) under the tongue every 5 (five) minutes as needed. 12/29/17   Arty Baumgartner, NP  ticagrelor (BRILINTA) 90 MG TABS tablet Take 1 tablet (90 mg total) by mouth 2 (two) times daily. 12/12/17   Corky Crafts, MD    Family History Family History  Problem Relation Age of Onset  . Hypertension Mother   . Diabetes Mother   . Hypertension Father   . Diabetes Father     Social History Social History   Tobacco Use  . Smoking status: Current Every Day Smoker    Packs/day: 1.00    Types: Cigarettes  . Smokeless tobacco: Never Used  Substance Use Topics  . Alcohol use: Yes  . Drug use: Yes    Comment: heroin      Allergies   Patient has no known allergies.   Review of Systems Review of Systems  Respiratory: Positive for cough.   Cardiovascular: Positive for chest pain.  Gastrointestinal: Positive for nausea and vomiting.     Physical Exam Updated Vital Signs BP 111/62   Pulse 97   Temp 98.9 F (37.2 C) (Oral)   Resp (!) 21   Ht 5\' 6"  (1.676 m)   Wt 68 kg   SpO2 92%   BMI 24.21 kg/m   Physical Exam  Constitutional: He is oriented to person, place, and time. He appears well-developed and well-nourished.  Smell of EtOH on breath.  Keeps eyes closed during exam.  HENT:  Head: Normocephalic.  Right Ear: External ear normal.  Left Ear: External ear normal.  Nose: Nose normal.  Eyes: Conjunctivae and EOM are normal. No scleral icterus.  Neck: Normal range of motion. Neck supple.  Cardiovascular: Normal rate, regular rhythm, normal heart sounds and intact distal pulses.  1+ radial and DP pulses bilaterally.  No lower extremity edema or calf tenderness.  Pulmonary/Chest: Effort normal and breath sounds normal.  Abdominal: Soft. There is no tenderness.  No obvious distention.  Nontender.  Negative Murphy's and McBurney's.  Musculoskeletal: Normal range of motion. He exhibits no deformity.  Neurological: He is alert and oriented to person, place, and time.  Skin: Skin is warm and dry. Capillary refill takes less than 2 seconds.    Psychiatric: Judgment and thought content normal. His speech is tangential.  Nursing note and vitals reviewed.  ED Treatments / Results  Labs (all labs ordered are listed, but only abnormal results are displayed) Labs Reviewed  BASIC METABOLIC PANEL - Abnormal; Notable for the following components:      Result Value   Sodium 128 (*)    Chloride 92 (*)    CO2 17 (*)    Glucose, Bld 103 (*)    Anion gap 19 (*)    All other components within normal limits  CBC - Abnormal; Notable for the following components:   WBC 10.8 (*)    MCV 104.6 (*)    MCH 34.2 (*)    All other components within normal limits  ETHANOL - Abnormal; Notable for the following components:   Alcohol, Ethyl (B) 174 (*)    All other components within normal limits  RAPID URINE DRUG SCREEN, HOSP PERFORMED - Abnormal; Notable for the following components:   Cocaine POSITIVE (*)    All other components within normal limits  URINALYSIS, ROUTINE W REFLEX MICROSCOPIC - Abnormal; Notable for the following components:   Color, Urine STRAW (*)    Specific Gravity, Urine 1.002 (*)    Ketones, ur 5 (*)    All other components within normal limits  D-DIMER, QUANTITATIVE (NOT AT [facility]) - Abnormal; Notable for the following components:   D-Dimer, Quant 4.24 (*)    All other components within normal limits  LIPASE, BLOOD  HEPATIC FUNCTION PANEL  I-STAT TROPONIN, ED  I-STAT TROPONIN, ED    EKG EKG Interpretation  Date/Time:  Tuesday October 10 2018 20:57:50 EDT Ventricular Rate:  104 PR Interval:    QRS Duration: 91 QT Interval:  368 QTC Calculation: 484 R Axis:   93 Text Interpretation:  Sinus tachycardia Borderline right axis deviation Borderline prolonged QT interval improved TWI in III and aVF compared to january 2019 Confirmed by Marily Memos [phone]) on 10/10/2018 10:17:19 PM   Radiology Dg Chest 2 View  Result Date: 10/10/2018 CLINICAL DATA:  Left-sided chest pain with inspiration and cough. Smoker.  EXAM: CHEST - 2 VIEW COMPARISON:  11/01/2017 FINDINGS: Mild peribronchial thickening with increased interstitial lung markings about the hila compatible with bronchitic change. No confluent airspace disease. Heart and mediastinal contours are within normal limits. No effusion or pneumothorax. IMPRESSION: Mild peribronchial thickening and increased interstitial lung markings suspicious for bronchitic change. Electronically Signed   By: Tollie Eth M.D.   On: 10/10/2018 21:53    Procedures Procedures (including critical care time)  Medications Ordered in ED Medications  sodium chloride 0.9 % bolus 1,000 mL (1,000 mLs Intravenous New Bag/Given 10/11/18 0042)  folic acid injection 1 mg (1 mg Intravenous Given 10/11/18 0047)  iopamidol (ISOVUE-370) 76 % injection (has no administration in time range)  thiamine (B-1) injection 100 mg (100 mg Intravenous Given 10/11/18 0045)     Initial Impression / Assessment and Plan / ED Course  I have reviewed the triage vital signs and the nursing notes.  Pertinent labs & imaging results that were available during my care of the patient were reviewed by me and considered in my medical decision making (see chart for details).  Clinical Course as of Oct 11 58  Tue Oct 10, 2018  2355 Alcohol, Ethyl (B)(!): 174 [CG]  2355 COCAINE(!): POSITIVE [CG]  2356 Sodium(!): 128 [CG]  2356 Glucose(!): 103 [CG]  2356 Anion gap(!): 19 [CG]  Wed Oct 11, 2018  0000 D-Dimer, Quant(!): 4.24 [CG]  0001 IMPRESSION: Mild peribronchial thickening and increased interstitial lung markings suspicious for bronchitic change.  DG Chest 2 View [CG]  0001 Sinus tachycardia Borderline right axis deviation Borderline prolonged QT interval improved TWI in III and aVF compared to january 2019 Confirmed by Marily Memos [phone]) on 10/10/2018 10:17:19 PM  EKG 12-Lead [CG]    Clinical Course User Index [CG] Liberty Handy, PA-C   Considering cardiac ischemia versus COPD  flare/bronchitis.  He is tachycardic and tachypneic, complaining of pleuritic chest pain in setting of recent increased cough so this is probably MSK vs costochondritis however  PE is also on differential.  No fevers to suggest infiltrate however we will have to rule this out as well.  History is limited, patient appears to be intoxicated.  EtOH noted on breath.  He has a cardiac history including STEMI status post thrombectomy and DES, multiple cardiac  risk factors including tobacco use, hypertension, hyperlipidemia, diabetes.  It is unclear if patient has been compliant with his medicines.  0003: Slight hyperglycemia, hyponatremia and acidosis noted thought to be from EtOH.  He has documented history of diabetes but unsure if patient has been compliant.  We will give IV fluids.  EKG with improving as he wave inversions compared to previous EKG.  Initial troponin WNL.  Positive EtOH and cocaine in urine.  D-dimer was obtained as suspicion for PE was considered to be low however this is elevated at 4.24.  Will give IVF, thiamine, folate. Pending CTA to r/o PE. CXR with bronchitis changes.  0045: Pending delta troponin CT angios to rule out PE. I think his CP is secondary to bronchitis/COPD.  He has cardiac history however his symptoms are not suggestive of new cardiac ischemia. Will hand pt off to oncoming EDPA who will f/u on work up.  Anticipate patient will be discharged with management for bronchitis including antibiotics, prednisone, albuterol.  He appears to be intoxicated and he will need to metabolize.  Patient discussed with Dr. Clayborne Dana.   1610: Re-evaluated pt prior to hand off. He is asleep but easily arousable. He admits he has not taken any of his meds "in a while".  CP has improved, but still reproducible with palpation and when he moves in bed.  He is aware of ER plan. VSS, Final Clinical Impressions(s) / ED Diagnoses   Final diagnoses:  Bronchitis  Atypical chest pain  Acute alcoholic  intoxication without complication [facility])  Cocaine use    ED Discharge Orders    None       Liberty Handy, PA-C 10/11/18 0059    Marily Memos, MD 10/12/18 [phone]

## 2018-10-11 ENCOUNTER — Emergency Department (HOSPITAL_COMMUNITY): Payer: Medicare Other

## 2018-10-11 DIAGNOSIS — J40 Bronchitis, not specified as acute or chronic: Secondary | ICD-10-CM | POA: Diagnosis not present

## 2018-10-11 DIAGNOSIS — R0789 Other chest pain: Secondary | ICD-10-CM | POA: Diagnosis not present

## 2018-10-11 LAB — I-STAT TROPONIN, ED: TROPONIN I, POC: 0.01 ng/mL (ref 0.00–0.08)

## 2018-10-11 MED ORDER — DOXYCYCLINE HYCLATE 100 MG PO CAPS: 100.0000 mg | ORAL_CAPSULE | Freq: Two times a day (BID) | ORAL | 0 refills | Status: DC

## 2018-10-11 MED ORDER — PREDNISONE 20 MG PO TABS: ORAL_TABLET | ORAL | 0 refills | Status: DC

## 2018-10-11 MED ORDER — IOPAMIDOL (ISOVUE-370) INJECTION 76%
100.0000 mL | Freq: Once | INTRAVENOUS | Status: AC | PRN
  Administered 2018-10-11: 75 mL via INTRAVENOUS

## 2018-10-11 MED ORDER — IOPAMIDOL (ISOVUE-370) INJECTION 76%
INTRAVENOUS | Status: AC
  Filled 2018-10-11: qty 100

## 2018-10-11 MED ORDER — FAMOTIDINE IN NACL 20-0.9 MG/50ML-% IV SOLN
20.0000 mg | INTRAVENOUS | Status: AC
  Administered 2018-10-11: 20 mg via INTRAVENOUS
  Filled 2018-10-11: qty 50

## 2018-10-11 MED ORDER — ALBUTEROL SULFATE HFA 108 (90 BASE) MCG/ACT IN AERS: 1.0000 | INHALATION_SPRAY | Freq: Four times a day (QID) | RESPIRATORY_TRACT | 0 refills | Status: DC | PRN

## 2018-10-11 MED ORDER — SUCRALFATE 1 G PO TABS
1.0000 g | ORAL_TABLET | Freq: Once | ORAL | Status: AC
  Administered 2018-10-11: 1 g via ORAL
  Filled 2018-10-11: qty 1

## 2018-10-11 MED ORDER — OMEPRAZOLE 20 MG PO CPDR: 20.0000 mg | DELAYED_RELEASE_CAPSULE | Freq: Every day | ORAL | 0 refills | Status: DC

## 2018-10-11 MED ORDER — GI COCKTAIL ~~LOC~~
30.0000 mL | Freq: Once | ORAL | Status: AC
  Administered 2018-10-11: 30 mL via ORAL
  Filled 2018-10-11: qty 30

## 2018-10-11 NOTE — ED Notes (Signed)
Patient transported to CT 

## 2018-10-11 NOTE — ED Notes (Signed)
Reviewed d/c instructions with pt, who verbalized understanding and had no outstanding questions. Pt departed in NAD, refused use of wheelchair.   

## 2018-10-11 NOTE — Discharge Instructions (Signed)
Take the prescribed medication as directed. Follow-up with your primary care doctor.  If you do not have one, call the 800 number on the paperwork to help you find local clinic. We recommend abstaining from alcohol. Return to the ED for new or worsening symptoms.

## 2018-10-11 NOTE — ED Provider Notes (Addendum)
Assumed care from PA Gibbons at shift change.  See prior note for full H&P.  Briefly, 57 year old male with history of MI status post stenting, hypertension, hyperlipidemia, alcohol abuse, presenting to the ED with chest pain.  Has been ongoing for about 2 to 3 days.  No exertional component.  Has had productive cough.  Labs with elevated alcohol as well as d-dimer.  CXR with bronchitic changes.  Plan: CTA and delta troponin pending.  If negative and patient clinically sober, can discharge home with treatment for bronchitis.  Results for orders placed or performed during the hospital encounter of 10/10/18  Basic metabolic panel  Result Value Ref Range   Sodium 128 (L) 135 - 145 mmol/L   Potassium 3.6 3.5 - 5.1 mmol/L   Chloride 92 (L) 98 - 111 mmol/L   CO2 17 (L) 22 - 32 mmol/L   Glucose, Bld 103 (H) 70 - 99 mg/dL   BUN 6 6 - 20 mg/dL   Creatinine, Ser 5.40 0.61 - 1.24 mg/dL   Calcium 9.1 8.9 - 98.1 mg/dL   GFR calc non Af Amer >60 >60 mL/min   GFR calc Af Amer >60 >60 mL/min   Anion gap 19 (H) 5 - 15  CBC  Result Value Ref Range   WBC 10.8 (H) 4.0 - 10.5 K/uL   RBC 4.33 4.22 - 5.81 MIL/uL   Hemoglobin 14.8 13.0 - 17.0 g/dL   HCT 19.1 47.8 - 29.5 %   MCV 104.6 (H) 80.0 - 100.0 fL   MCH 34.2 (H) 26.0 - 34.0 pg   MCHC 32.7 30.0 - 36.0 g/dL   RDW 62.1 30.8 - 65.7 %   Platelets 235 150 - 400 K/uL   nRBC 0.0 0.0 - 0.2 %  Lipase, blood  Result Value Ref Range   Lipase 28 11 - 51 U/L  Hepatic function panel  Result Value Ref Range   Total Protein 7.0 6.5 - 8.1 g/dL   Albumin 3.9 3.5 - 5.0 g/dL   AST 24 15 - 41 U/L   ALT 17 0 - 44 U/L   Alkaline Phosphatase 76 38 - 126 U/L   Total Bilirubin 0.8 0.3 - 1.2 mg/dL   Bilirubin, Direct 0.2 0.0 - 0.2 mg/dL   Indirect Bilirubin 0.6 0.3 - 0.9 mg/dL  Ethanol  Result Value Ref Range   Alcohol, Ethyl (B) 174 (H) <10 mg/dL  Rapid urine drug screen (hospital performed)  Result Value Ref Range   Opiates NONE DETECTED NONE DETECTED   Cocaine  POSITIVE (A) NONE DETECTED   Benzodiazepines NONE DETECTED NONE DETECTED   Amphetamines NONE DETECTED NONE DETECTED   Tetrahydrocannabinol NONE DETECTED NONE DETECTED   Barbiturates NONE DETECTED NONE DETECTED  Urinalysis, Routine w reflex microscopic  Result Value Ref Range   Color, Urine STRAW (A) YELLOW   APPearance CLEAR CLEAR   Specific Gravity, Urine 1.002 (L) 1.005 - 1.030   pH 5.0 5.0 - 8.0   Glucose, UA NEGATIVE NEGATIVE mg/dL   Hgb urine dipstick NEGATIVE NEGATIVE   Bilirubin Urine NEGATIVE NEGATIVE   Ketones, ur 5 (A) NEGATIVE mg/dL   Protein, ur NEGATIVE NEGATIVE mg/dL   Nitrite NEGATIVE NEGATIVE   Leukocytes, UA NEGATIVE NEGATIVE  D-dimer, quantitative (not at [facility])  Result Value Ref Range   D-Dimer, Quant 4.24 (H) 0.00 - 0.50 ug/mL-FEU  I-stat troponin, ED  Result Value Ref Range   Troponin i, poc 0.01 0.00 - 0.08 ng/mL   Comment 3  I-Stat Troponin, ED (not at [facility])  Result Value Ref Range   Troponin i, poc 0.01 0.00 - 0.08 ng/mL   Comment 3           Dg Chest 2 View  Result Date: 10/10/2018 CLINICAL DATA:  Left-sided chest pain with inspiration and cough. Smoker. EXAM: CHEST - 2 VIEW COMPARISON:  11/01/2017 FINDINGS: Mild peribronchial thickening with increased interstitial lung markings about the hila compatible with bronchitic change. No confluent airspace disease. Heart and mediastinal contours are within normal limits. No effusion or pneumothorax. IMPRESSION: Mild peribronchial thickening and increased interstitial lung markings suspicious for bronchitic change. Electronically Signed   By: Tollie Eth M.D.   On: 10/10/2018 21:53   Ct Angio Chest Pe W And/or Wo Contrast  Result Date: 10/11/2018 CLINICAL DATA:  Center chest pain EXAM: CT ANGIOGRAPHY CHEST WITH CONTRAST TECHNIQUE: Multidetector CT imaging of the chest was performed using the standard protocol during bolus administration of intravenous contrast. Multiplanar CT image reconstructions and MIPs  were obtained to evaluate the vascular anatomy. CONTRAST:  75mL ISOVUE-370 IOPAMIDOL (ISOVUE-370) INJECTION 76% COMPARISON:  Chest x-ray 10/10/2018, CT chest 02/16/2006 FINDINGS: Cardiovascular: Satisfactory opacification of the pulmonary arteries to the segmental level. No evidence of pulmonary embolism. Normal heart size. No pericardial effusion. Nonaneurysmal aorta. Coronary artery calcification. Mediastinum/Nodes: No enlarged mediastinal, hilar, or axillary lymph nodes. Thyroid gland, and trachea demonstrate no acute abnormality. Mild circumferential wall thickening of the distal esophagus. Lungs/Pleura: Lungs are clear. No pleural effusion or pneumothorax. Upper Abdomen: No acute abnormality. Musculoskeletal: Multiple old right upper rib fractures. No acute or suspicious abnormality. Degenerative changes of the spine. Review of the MIP images confirms the above findings. IMPRESSION: 1. Negative for acute pulmonary embolus or aortic dissection 2. Mild circumferential thickening of the distal esophagus may be due to mild esophagitis or reflux 3. Clear lung fields Electronically Signed   By: Jasmine Pang M.D.   On: 10/11/2018 01:46    CTA negative for PE.  Does have some findings of esophagitis/GERD.  This may be precipitated by his alcohol abuse.  Patient has continued to complain of pain here.  Delta troponin is negative.  Ordered GI cocktail, Pepcid, Carafate.  Will need to continue to sober.  Patient has been resting throughout the night here.  Vitals stable.  Will d/c home with symptomatic care for GERD given CT findings as well as treatment for bronchitis as per prior team.  Encouraged alcohol cessation.  Close follow-up with PCP.  He will return here for any new/acute changes.   Garlon Hatchet, PA-C 10/11/18 0546    Garlon Hatchet, PA-C 10/11/18 0981    Shon Baton, MD 10/11/18 [phone]

## 2018-10-15 ENCOUNTER — Emergency Department (HOSPITAL_COMMUNITY)
Admission: EM | Admit: 2018-10-15 | Discharge: 2018-10-16 | Disposition: A | Discharge status: Home / Self Care | Payer: Medicare Other | Attending: Emergency Medicine | Admitting: Emergency Medicine

## 2018-10-15 ENCOUNTER — Encounter (HOSPITAL_COMMUNITY): Payer: Self-pay

## 2018-10-15 ENCOUNTER — Emergency Department (HOSPITAL_COMMUNITY): Payer: Medicare Other

## 2018-10-15 DIAGNOSIS — F10920 Alcohol use, unspecified with intoxication, uncomplicated: Secondary | ICD-10-CM

## 2018-10-15 DIAGNOSIS — F17210 Nicotine dependence, cigarettes, uncomplicated: Secondary | ICD-10-CM | POA: Insufficient documentation

## 2018-10-15 DIAGNOSIS — F10929 Alcohol use, unspecified with intoxication, unspecified: Secondary | ICD-10-CM | POA: Insufficient documentation

## 2018-10-15 DIAGNOSIS — E119 Type 2 diabetes mellitus without complications: Secondary | ICD-10-CM | POA: Insufficient documentation

## 2018-10-15 DIAGNOSIS — Z79899 Other long term (current) drug therapy: Secondary | ICD-10-CM | POA: Diagnosis not present

## 2018-10-15 DIAGNOSIS — Z7984 Long term (current) use of oral hypoglycemic drugs: Secondary | ICD-10-CM | POA: Diagnosis not present

## 2018-10-15 DIAGNOSIS — Z7982 Long term (current) use of aspirin: Secondary | ICD-10-CM | POA: Insufficient documentation

## 2018-10-15 DIAGNOSIS — I10 Essential (primary) hypertension: Secondary | ICD-10-CM | POA: Diagnosis not present

## 2018-10-15 DIAGNOSIS — R079 Chest pain, unspecified: Secondary | ICD-10-CM | POA: Diagnosis not present

## 2018-10-15 DIAGNOSIS — J449 Chronic obstructive pulmonary disease, unspecified: Secondary | ICD-10-CM | POA: Diagnosis not present

## 2018-10-15 DIAGNOSIS — R0789 Other chest pain: Secondary | ICD-10-CM | POA: Diagnosis not present

## 2018-10-15 LAB — CBC
HCT: 43 % (ref 39.0–52.0)
Hemoglobin: 14.6 g/dL (ref 13.0–17.0)
MCH: 34.8 pg — AB (ref 26.0–34.0)
MCHC: 34 g/dL (ref 30.0–36.0)
MCV: 102.6 fL — ABNORMAL HIGH (ref 80.0–100.0)
Platelets: 231 10*3/uL (ref 150–400)
RBC: 4.19 MIL/uL — ABNORMAL LOW (ref 4.22–5.81)
RDW: 12.7 % (ref 11.5–15.5)
WBC: 8.6 10*3/uL (ref 4.0–10.5)
nRBC: 0 % (ref 0.0–0.2)

## 2018-10-15 LAB — I-STAT TROPONIN, ED: TROPONIN I, POC: 0.01 ng/mL (ref 0.00–0.08)

## 2018-10-15 MED ORDER — SODIUM CHLORIDE 0.9 % IV BOLUS
1000.0000 mL | Freq: Once | INTRAVENOUS | Status: AC
  Administered 2018-10-16: 1000 mL via INTRAVENOUS

## 2018-10-15 NOTE — ED Provider Notes (Signed)
[facility] EMERGENCY DEPARTMENT Provider Note   CSN: [id number] Arrival date & time: 10/15/18  2215     History   Chief Complaint Chief Complaint  Patient presents with  . Chest Pain    HPI Warren Salas is a 57 y.o. male history of back pain, COPD, diabetes, hypertension, STEMI (2018) who presents for evaluation of chest pain.  Per EMS, patient walked into a restaurant and asked bystanders to call EMS because of his chest pain.  Patient states that his chest pain never went away from 2 days ago when he was seen here in the ED.  He states that is continued and he states that "he needs to make sure he is not having a heart attack."  He does smoke cigarettes.  He does report alcohol use today but does not tell me how much.  He states that "I drink a little beer."  Patient reports some associated difficulty breathing.  EM LEVEL 5 CAVEAT DUE TO ALCOHOL INTOXICATION.   The history is provided by the patient.    Past Medical History:  Diagnosis Date  . Back pain   . COPD (chronic obstructive pulmonary disease) (HCC)   . Diabetes mellitus without complication (HCC)   . Hypertension   . STEMI (ST elevation myocardial infarction) (HCC) 10/2017    Patient Active Problem List   Diagnosis Date Noted  . CAD (coronary artery disease) 12/12/2017  . Old MI (myocardial infarction) 12/12/2017  . Tobacco abuse 12/12/2017  . Type 2 diabetes mellitus with complication, without long-term current use of insulin (HCC) 12/12/2017  . Acute MI, inferior wall (HCC) 11/01/2017  . Acute inferior myocardial infarction [facility])     Past Surgical History:  Procedure Laterality Date  . APPENDECTOMY    . CORONARY STENT INTERVENTION N/A 12/29/2017   Procedure: CORONARY STENT INTERVENTION - LAD;  Surgeon: Corky Crafts, MD;  Location: [facility] INVASIVE CV LAB;  Service: Cardiovascular;  Laterality: N/A;  . CORONARY/GRAFT ACUTE MI REVASCULARIZATION N/A 11/01/2017   Procedure:  Coronary/Graft Acute MI Revascularization;  Surgeon: Corky Crafts, MD;  Location: [facility] INVASIVE CV LAB;  Service: Cardiovascular;  Laterality: N/A;  . LEFT HEART CATH AND CORONARY ANGIOGRAPHY N/A 11/01/2017   Procedure: LEFT HEART CATH AND CORONARY ANGIOGRAPHY;  Surgeon: Corky Crafts, MD;  Location: [facility] INVASIVE CV LAB;  Service: Cardiovascular;  Laterality: N/A;        Home Medications    Prior to Admission medications   Medication Sig Start Date End Date Taking? Authorizing Provider  albuterol (PROVENTIL HFA;VENTOLIN HFA) 108 (90 Base) MCG/ACT inhaler Inhale 1-2 puffs into the lungs every 6 (six) hours as needed for wheezing. 10/11/18   Garlon Hatchet, PA-C  aspirin EC 81 MG tablet Take 81 mg by mouth daily. 05/13/15   [provider]  atorvastatin (LIPITOR) 80 MG tablet Take 1 tablet (80 mg total) by mouth daily at 6 PM. 12/12/17   Corky Crafts, MD  budesonide-formoterol [facility]) 160-4.5 MCG/ACT inhaler Inhale 2 puffs into the lungs 2 (two) times daily as needed (For shortness of breath.).     [provider]  doxycycline (VIBRAMYCIN) 100 MG capsule Take 1 capsule (100 mg total) by mouth 2 (two) times daily. 10/11/18   Garlon Hatchet, PA-C  isosorbide mononitrate (IMDUR) 30 MG 24 hr tablet Take 1 tablet (30 mg total) by mouth daily. 12/12/17   Corky Crafts, MD  lisinopril (PRINIVIL,ZESTRIL) 10 MG tablet Take 0.5 tablets (5 mg total) by  mouth daily. 12/12/17   Corky Crafts, MD  metFORMIN (GLUCOPHAGE) 500 MG tablet Take 500 mg by mouth 2 (two) times daily. 01/26/16   [provider]  metoprolol tartrate (LOPRESSOR) 50 MG tablet Take 1 tablet (50 mg total) by mouth 2 (two) times daily. 12/12/17   Corky Crafts, MD  nitroGLYCERIN (NITROSTAT) 0.4 MG SL tablet Place 1 tablet (0.4 mg total) under the tongue every 5 (five) minutes as needed. 12/29/17   Arty Baumgartner, NP  omeprazole (PRILOSEC) 20 MG capsule Take 1 capsule (20  mg total) by mouth daily. 10/11/18   Garlon Hatchet, PA-C  predniSONE (DELTASONE) 20 MG tablet Take 40 mg by mouth daily for 3 days, then 20mg  by mouth daily for 3 days, then 10mg  daily for 3 days 10/11/18   Garlon Hatchet, PA-C  ticagrelor (BRILINTA) 90 MG TABS tablet Take 1 tablet (90 mg total) by mouth 2 (two) times daily. 12/12/17   Corky Crafts, MD    Family History Family History  Problem Relation Age of Onset  . Hypertension Mother   . Diabetes Mother   . Hypertension Father   . Diabetes Father     Social History Social History   Tobacco Use  . Smoking status: Current Every Day Smoker    Packs/day: 1.00    Types: Cigarettes  . Smokeless tobacco: Never Used  Substance Use Topics  . Alcohol use: Yes  . Drug use: Yes    Comment: heroin      Allergies   Patient has no known allergies.   Review of Systems Review of Systems  Unable to perform ROS: Mental status change     Physical Exam Updated Vital Signs BP 109/77 (BP Location: Right Arm)   Pulse (!) 108   Temp 98.2 F (36.8 C) (Oral)   Resp 17   SpO2 96%   Physical Exam  Constitutional: He is oriented to person, place, and time. He appears well-developed and well-nourished.  Appears intoxicated.  HENT:  Head: Normocephalic and atraumatic.  Mouth/Throat: Oropharynx is clear and moist and mucous membranes are normal.  Eyes: Conjunctivae, EOM and lids are normal.  Neck: Full passive range of motion without pain.  Cardiovascular: Normal rate, regular rhythm, normal heart sounds and normal pulses. Exam reveals no gallop and no friction rub.  No murmur heard. Pulses:      Radial pulses are 2+ on the right side, and 2+ on the left side.       Dorsalis pedis pulses are 2+ on the right side, and 2+ on the left side.  Pulmonary/Chest: Effort normal and breath sounds normal.  Lungs clear to auscultation bilaterally.  Symmetric chest rise.  No wheezing, rales, rhonchi.  Tenderness palpation of the  anterior chest.  Pain is reproduced with palpation of chest.  Abdominal: Soft. Normal appearance. There is no tenderness. There is no rigidity and no guarding.  Abdomen is soft, non-distended, non-tender. No rigidity, No guarding. No peritoneal signs.  Musculoskeletal: Normal range of motion.  Neurological: He is alert and oriented to person, place, and time.  Moving extremities spontaneously. Follows commands.  Skin: Skin is warm and dry. Capillary refill takes less than 2 seconds.  Psychiatric: He has a normal mood and affect. His speech is normal.  Nursing note and vitals reviewed.    ED Treatments / Results  Labs (all labs ordered are listed, but only abnormal results are displayed) Labs Reviewed  BASIC METABOLIC PANEL - Abnormal; Notable for  the following components:      Result Value   Potassium 3.1 (*)    CO2 21 (*)    Glucose, Bld 116 (*)    BUN 5 (*)    All other components within normal limits  CBC - Abnormal; Notable for the following components:   RBC 4.19 (*)    MCV 102.6 (*)    MCH 34.8 (*)    All other components within normal limits  ETHANOL - Abnormal; Notable for the following components:   Alcohol, Ethyl (B) 280 (*)    All other components within normal limits  I-STAT TROPONIN, ED    EKG None  Radiology Dg Chest 2 View  Result Date: 10/15/2018 CLINICAL DATA:  Chest pain today. EXAM: CHEST - 2 VIEW COMPARISON:  Radiographs and CT 10/11/2018 FINDINGS: Lower lung volumes from prior exam.The cardiomediastinal contours are normal. Mild central bronchial thickening again seen. Pulmonary vasculature is normal. No consolidation, pleural effusion, or pneumothorax. No acute osseous abnormalities are seen. IMPRESSION: Lower lung volumes from exams 4 days ago. No other interval change. Mild central bronchial thickening is again seen. Electronically Signed   By: Narda Rutherford M.D.   On: 10/15/2018 23:09    Procedures Procedures (including critical care  time)  Medications Ordered in ED Medications  sodium chloride 0.9 % bolus 1,000 mL (1,000 mLs Intravenous New Bag/Given 10/16/18 0038)  thiamine (B-1) injection 100 mg (100 mg Intravenous Given 10/16/18 0038)     Initial Impression / Assessment and Plan / ED Course  I have reviewed the triage vital signs and the nursing notes.  Pertinent labs & imaging results that were available during my care of the patient were reviewed by me and considered in my medical decision making (see chart for details).     57 year old male brought in by EMS for evaluation of chest pain.  Patient reports that his chest pain has been ongoing since 2 days ago.  He went into a restaurant and at somebody call EMS that he could be evaluated for his chest pain.  Does endorse drinking but will not tell me how much.  Patient was seen here in the ED on 10/11/2018 for evaluation of similar symptoms.  At that time, his d-dimer is elevated.  He had a CTA which was negative for any acute PE.  Patient had negative troponins and was eventually discharged home when he metabolized.  Initial troponin negative.  CBC shows no leukocytosis or anemia.  BMP shows potassium of 3.1, bicarb of 21. Ethanol is 280. Chest x-ray shows no infectious etiology.  He has mild bronchial thickening. EKG shows sinus tach at rate 106.   Patient does have history of MI, hypertension, diabetes.  His pain is reproducible on palpation.  Given his cardiac history, will plan for delta troponin.  If second troponin is negative, he can be reasonably discharged home.  Additionally, will have patient metabolize alcohol.  Patient signed out to Elpidio Anis, PA-C with second troponin being.  Additionally, patient will need to sober up.  Please see her note for further ED course.   Final Clinical Impressions(s) / ED Diagnoses   Final diagnoses:  Nonspecific chest pain  Alcoholic intoxication without complication [facility])    ED Discharge Orders    None        Maxwell Caul, PA-C 10/16/18 0046    Melene Plan, DO 10/16/18 [phone]

## 2018-10-15 NOTE — ED Triage Notes (Signed)
Patient BIB [facility] for chest pain since today. EMS states patient walked into a restaurant asking someone to call EMS because he was having chest pain. Also c/o nausea. Denies SOB, vomiting, abdominal pain, or dysuria. Patient reports drinking today "unknown amount, I can drink with the best of them". EMS gave 324 mg aspirin, and 1 NTG.   BP 102/58 (after NTG) HR 115   CBG 146

## 2018-10-16 DIAGNOSIS — R079 Chest pain, unspecified: Secondary | ICD-10-CM | POA: Diagnosis not present

## 2018-10-16 LAB — ETHANOL: Alcohol, Ethyl (B): 280 mg/dL — ABNORMAL HIGH (ref <10)

## 2018-10-16 LAB — I-STAT TROPONIN, ED: Troponin i, poc: 0.02 ng/mL (ref 0.00–0.08)

## 2018-10-16 LAB — BASIC METABOLIC PANEL
ANION GAP: 13 (ref 5–15)
BUN: 5 mg/dL — ABNORMAL LOW (ref 6–20)
CHLORIDE: 106 mmol/L (ref 98–111)
CO2: 21 mmol/L — AB (ref 22–32)
Calcium: 9 mg/dL (ref 8.9–10.3)
Creatinine, Ser: 0.87 mg/dL (ref 0.61–1.24)
GFR calc Af Amer: >60 mL/min (ref >60)
GFR calc non Af Amer: >60 mL/min (ref >60)
Glucose, Bld: 116 mg/dL — ABNORMAL HIGH (ref 70–99)
POTASSIUM: 3.1 mmol/L — AB (ref 3.5–5.1)
Sodium: 140 mmol/L (ref 135–145)

## 2018-10-16 MED ORDER — THIAMINE HCL 100 MG/ML IJ SOLN
100.0000 mg | Freq: Once | INTRAMUSCULAR | Status: AC
  Administered 2018-10-16: 100 mg via INTRAVENOUS
  Filled 2018-10-16: qty 2

## 2018-10-16 MED ORDER — AMMONIA AROMATIC IN INHA
RESPIRATORY_TRACT | Status: AC
  Filled 2018-10-16: qty 10

## 2018-10-16 NOTE — ED Provider Notes (Signed)
Patient signed out at end of shift by Gwendalyn Ege, PA-C, who presented to ED for evaluation of chest pain. H/o MI. Here on 10/16 for same - has been continuous (pain for 5 days). Patient admits to being intoxicated - ETOH 280. Has risk factors for ACS so delta trop ordered and is due at 2:30 am. CTA chest on 16th and was negative.   Patient is sleeping, difficult to waken but arousable. His delta troponin is negative and he will be discharged when clinically sober.   3:30 - still sleeping, hard to waken and slurring words when he is awake. Will continue to observe.  5:15 - patient found to be up at bedside with urinal. He was able to get up out of the stretcher, stand and have coordination to use the urinal without assistance. He can be discharged home and reports he has a friend that will pick him up.   Elpidio Anis, PA-C 10/16/18 0981    Geoffery Lyons, MD 10/16/18 [phone]

## 2018-10-16 NOTE — ED Notes (Signed)
Patient sitting on the side of the bed getting dressed.

## 2018-10-16 NOTE — Discharge Instructions (Addendum)
Follow up with your doctor for further outpatient evaluation if chest pain continues. Follow up with the resources provided if you feel you want help with alcohol abuse.

## 2018-10-22 ENCOUNTER — Other Ambulatory Visit: Payer: Self-pay

## 2018-10-22 ENCOUNTER — Encounter (HOSPITAL_COMMUNITY): Payer: Self-pay | Admitting: Emergency Medicine

## 2018-10-22 ENCOUNTER — Emergency Department (HOSPITAL_COMMUNITY): Payer: Medicare Other

## 2018-10-22 ENCOUNTER — Emergency Department (HOSPITAL_COMMUNITY)
Admission: EM | Admit: 2018-10-22 | Discharge: 2018-10-22 | Disposition: A | Discharge status: Home / Self Care | Payer: Medicare Other | Attending: Emergency Medicine | Admitting: Emergency Medicine

## 2018-10-22 DIAGNOSIS — S0990XA Unspecified injury of head, initial encounter: Secondary | ICD-10-CM | POA: Diagnosis not present

## 2018-10-22 DIAGNOSIS — M542 Cervicalgia: Secondary | ICD-10-CM | POA: Diagnosis not present

## 2018-10-22 DIAGNOSIS — J449 Chronic obstructive pulmonary disease, unspecified: Secondary | ICD-10-CM | POA: Insufficient documentation

## 2018-10-22 DIAGNOSIS — E119 Type 2 diabetes mellitus without complications: Secondary | ICD-10-CM | POA: Insufficient documentation

## 2018-10-22 DIAGNOSIS — R55 Syncope and collapse: Secondary | ICD-10-CM | POA: Insufficient documentation

## 2018-10-22 DIAGNOSIS — I10 Essential (primary) hypertension: Secondary | ICD-10-CM | POA: Insufficient documentation

## 2018-10-22 DIAGNOSIS — F17210 Nicotine dependence, cigarettes, uncomplicated: Secondary | ICD-10-CM | POA: Insufficient documentation

## 2018-10-22 DIAGNOSIS — R0602 Shortness of breath: Secondary | ICD-10-CM | POA: Diagnosis not present

## 2018-10-22 DIAGNOSIS — R0789 Other chest pain: Secondary | ICD-10-CM

## 2018-10-22 DIAGNOSIS — R51 Headache: Secondary | ICD-10-CM | POA: Diagnosis not present

## 2018-10-22 DIAGNOSIS — R05 Cough: Secondary | ICD-10-CM | POA: Diagnosis not present

## 2018-10-22 DIAGNOSIS — R4020 Unspecified coma: Secondary | ICD-10-CM | POA: Diagnosis not present

## 2018-10-22 DIAGNOSIS — I252 Old myocardial infarction: Secondary | ICD-10-CM | POA: Insufficient documentation

## 2018-10-22 DIAGNOSIS — I2510 Atherosclerotic heart disease of native coronary artery without angina pectoris: Secondary | ICD-10-CM | POA: Insufficient documentation

## 2018-10-22 DIAGNOSIS — R000 Tachycardia, unspecified: Secondary | ICD-10-CM | POA: Diagnosis not present

## 2018-10-22 DIAGNOSIS — F1010 Alcohol abuse, uncomplicated: Secondary | ICD-10-CM | POA: Insufficient documentation

## 2018-10-22 DIAGNOSIS — R079 Chest pain, unspecified: Secondary | ICD-10-CM | POA: Diagnosis not present

## 2018-10-22 DIAGNOSIS — Z79899 Other long term (current) drug therapy: Secondary | ICD-10-CM | POA: Insufficient documentation

## 2018-10-22 DIAGNOSIS — S199XXA Unspecified injury of neck, initial encounter: Secondary | ICD-10-CM | POA: Diagnosis not present

## 2018-10-22 DIAGNOSIS — S299XXA Unspecified injury of thorax, initial encounter: Secondary | ICD-10-CM | POA: Diagnosis not present

## 2018-10-22 LAB — BASIC METABOLIC PANEL
ANION GAP: 12 (ref 5–15)
BUN: 11 mg/dL (ref 6–20)
CO2: 22 mmol/L (ref 22–32)
Calcium: 8.4 mg/dL — ABNORMAL LOW (ref 8.9–10.3)
Chloride: 107 mmol/L (ref 98–111)
Creatinine, Ser: 0.76 mg/dL (ref 0.61–1.24)
GFR calc Af Amer: >60 mL/min (ref >60)
GLUCOSE: 117 mg/dL — AB (ref 70–99)
POTASSIUM: 3.5 mmol/L (ref 3.5–5.1)
SODIUM: 141 mmol/L (ref 135–145)

## 2018-10-22 LAB — CBC
HCT: 44.8 % (ref 39.0–52.0)
HEMOGLOBIN: 15 g/dL (ref 13.0–17.0)
MCH: 34.8 pg — ABNORMAL HIGH (ref 26.0–34.0)
MCHC: 33.5 g/dL (ref 30.0–36.0)
MCV: 103.9 fL — ABNORMAL HIGH (ref 80.0–100.0)
Platelets: 172 10*3/uL (ref 150–400)
RBC: 4.31 MIL/uL (ref 4.22–5.81)
RDW: 12.6 % (ref 11.5–15.5)
WBC: 7.6 10*3/uL (ref 4.0–10.5)
nRBC: 0 % (ref 0.0–0.2)

## 2018-10-22 LAB — POCT I-STAT TROPONIN I
TROPONIN I, POC: 0.03 ng/mL (ref 0.00–0.08)
Troponin i, poc: 0.03 ng/mL (ref 0.00–0.08)

## 2018-10-22 LAB — ETHANOL: ALCOHOL ETHYL (B): 198 mg/dL — AB (ref <10)

## 2018-10-22 LAB — D-DIMER, QUANTITATIVE: D-Dimer, Quant: 0.56 ug/mL-FEU — ABNORMAL HIGH (ref 0.00–0.50)

## 2018-10-22 MED ORDER — IOPAMIDOL (ISOVUE-370) INJECTION 76%
100.0000 mL | Freq: Once | INTRAVENOUS | Status: AC | PRN
  Administered 2018-10-22: 100 mL via INTRAVENOUS

## 2018-10-22 MED ORDER — IOPAMIDOL (ISOVUE-370) INJECTION 76%
INTRAVENOUS | Status: AC
  Filled 2018-10-22: qty 100

## 2018-10-22 MED ORDER — SODIUM CHLORIDE 0.9 % IJ SOLN
INTRAMUSCULAR | Status: AC
  Filled 2018-10-22: qty 50

## 2018-10-22 MED ORDER — IBUPROFEN 200 MG PO TABS
600.0000 mg | ORAL_TABLET | Freq: Once | ORAL | Status: AC
  Administered 2018-10-22: 600 mg via ORAL
  Filled 2018-10-22: qty 3

## 2018-10-22 NOTE — ED Notes (Signed)
EKG given to EDP,Molpus,MD., for review. 

## 2018-10-22 NOTE — ED Provider Notes (Signed)
[facility]-EMERGENCY DEPT Provider Note   CSN: [id number] Arrival date & time: 10/22/18  0417     History   Chief Complaint Chief Complaint  Patient presents with  . Chest Pain  . Loss of Consciousness    HPI Warren Salas is a 57 y.o. male.  57 year old male presents with 2 weeks of recurrent sharp chest pain is worse with palpation.  Denies any pleuritic component to it.  Patient also has a history of alcohol abuse and prior to arrival had had a syncopal event.  Patient states he was not short of breath prior to the event.  Denied any worsening of his chest pain.  Denied having severe headaches.  Did sustain injury to his head neck after the event.  Logical complaints at this time.  No recent history of blood loss.     Past Medical History:  Diagnosis Date  . Back pain   . COPD (chronic obstructive pulmonary disease) (HCC)   . Diabetes mellitus without complication (HCC)   . Hypertension   . STEMI (ST elevation myocardial infarction) (HCC) 10/2017    Patient Active Problem List   Diagnosis Date Noted  . CAD (coronary artery disease) 12/12/2017  . Old MI (myocardial infarction) 12/12/2017  . Tobacco abuse 12/12/2017  . Type 2 diabetes mellitus with complication, without long-term current use of insulin (HCC) 12/12/2017  . Acute MI, inferior wall (HCC) 11/01/2017  . Acute inferior myocardial infarction [facility])     Past Surgical History:  Procedure Laterality Date  . APPENDECTOMY    . CORONARY STENT INTERVENTION N/A 12/29/2017   Procedure: CORONARY STENT INTERVENTION - LAD;  Surgeon: Corky Crafts, MD;  Location: [facility] INVASIVE CV LAB;  Service: Cardiovascular;  Laterality: N/A;  . CORONARY/GRAFT ACUTE MI REVASCULARIZATION N/A 11/01/2017   Procedure: Coronary/Graft Acute MI Revascularization;  Surgeon: Corky Crafts, MD;  Location: [facility] INVASIVE CV LAB;  Service: Cardiovascular;  Laterality: N/A;  . LEFT HEART CATH AND CORONARY ANGIOGRAPHY  N/A 11/01/2017   Procedure: LEFT HEART CATH AND CORONARY ANGIOGRAPHY;  Surgeon: Corky Crafts, MD;  Location: [facility] INVASIVE CV LAB;  Service: Cardiovascular;  Laterality: N/A;        Home Medications    Prior to Admission medications   Medication Sig Start Date End Date Taking? Authorizing Provider  albuterol (PROVENTIL HFA;VENTOLIN HFA) 108 (90 Base) MCG/ACT inhaler Inhale 1-2 puffs into the lungs every 6 (six) hours as needed for wheezing. 10/11/18  Yes Garlon Hatchet, PA-C  aspirin EC 81 MG tablet Take 81 mg by mouth daily. 05/13/15  Yes [provider]  atorvastatin (LIPITOR) 80 MG tablet Take 1 tablet (80 mg total) by mouth daily at 6 PM. 12/12/17  Yes Corky Crafts, MD  budesonide-formoterol [facility]) 160-4.5 MCG/ACT inhaler Inhale 2 puffs into the lungs 2 (two) times daily as needed (For shortness of breath.).    Yes [provider]  isosorbide mononitrate (IMDUR) 30 MG 24 hr tablet Take 1 tablet (30 mg total) by mouth daily. 12/12/17  Yes Corky Crafts, MD  lisinopril (PRINIVIL,ZESTRIL) 10 MG tablet Take 0.5 tablets (5 mg total) by mouth daily. 12/12/17  Yes Corky Crafts, MD  metFORMIN (GLUCOPHAGE) 500 MG tablet Take 500 mg by mouth 2 (two) times daily. 01/26/16  Yes [provider]  metoprolol tartrate (LOPRESSOR) 50 MG tablet Take 1 tablet (50 mg total) by mouth 2 (two) times daily. 12/12/17  Yes Corky Crafts, MD  omeprazole (PRILOSEC) 20 MG  capsule Take 1 capsule (20 mg total) by mouth daily. 10/11/18  Yes Garlon Hatchet, PA-C  ticagrelor (BRILINTA) 90 MG TABS tablet Take 1 tablet (90 mg total) by mouth 2 (two) times daily. 12/12/17  Yes Corky Crafts, MD  doxycycline (VIBRAMYCIN) 100 MG capsule Take 1 capsule (100 mg total) by mouth 2 (two) times daily. 10/11/18   Garlon Hatchet, PA-C  nitroGLYCERIN (NITROSTAT) 0.4 MG SL tablet Place 1 tablet (0.4 mg total) under the tongue every 5 (five) minutes as needed.  12/29/17   Arty Baumgartner, NP  predniSONE (DELTASONE) 20 MG tablet Take 40 mg by mouth daily for 3 days, then 20mg  by mouth daily for 3 days, then 10mg  daily for 3 days 10/11/18   Garlon Hatchet, PA-C    Family History Family History  Problem Relation Age of Onset  . Hypertension Mother   . Diabetes Mother   . Hypertension Father   . Diabetes Father     Social History Social History   Tobacco Use  . Smoking status: Current Every Day Smoker    Packs/day: 1.00    Types: Cigarettes  . Smokeless tobacco: Never Used  Substance Use Topics  . Alcohol use: Yes  . Drug use: Yes    Comment: heroin      Allergies   Patient has no known allergies.   Review of Systems Review of Systems  All other systems reviewed and are negative.    Physical Exam Updated Vital Signs BP 117/82   Pulse 85   Temp 98.4 F (36.9 C) (Oral)   Resp 15   Ht 1.676 m (5\' 6" )   Wt 68 kg   SpO2 95%   BMI 24.20 kg/m   Physical Exam  Constitutional: He is oriented to person, place, and time. He appears well-developed and well-nourished.  Non-toxic appearance. No distress.  HENT:  Head: Normocephalic and atraumatic.  Eyes: Pupils are equal, round, and reactive to light. Conjunctivae, EOM and lids are normal.  Neck: Normal range of motion. Neck supple. No tracheal deviation present. No thyroid mass present.  Cardiovascular: Normal rate, regular rhythm and normal heart sounds. Exam reveals no gallop.  No murmur heard. Pulmonary/Chest: Effort normal and breath sounds normal. No stridor. No respiratory distress. He has no decreased breath sounds. He has no wheezes. He has no rhonchi. He has no rales. He exhibits tenderness. He exhibits no crepitus.    Abdominal: Soft. Normal appearance and bowel sounds are normal. He exhibits no distension. There is no tenderness. There is no rebound and no CVA tenderness.  Musculoskeletal: Normal range of motion. He exhibits no edema or tenderness.  Neurological:  He is alert and oriented to person, place, and time. He has normal strength. No cranial nerve deficit or sensory deficit. GCS eye subscore is 4. GCS verbal subscore is 5. GCS motor subscore is 6.  Skin: Skin is warm and dry. No abrasion and no rash noted.  Psychiatric: He has a normal mood and affect. His speech is normal and behavior is normal.  Nursing note and vitals reviewed.    ED Treatments / Results  Labs (all labs ordered are listed, but only abnormal results are displayed) Labs Reviewed  BASIC METABOLIC PANEL - Abnormal; Notable for the following components:      Result Value   Glucose, Bld 117 (*)    Calcium 8.4 (*)    All other components within normal limits  CBC - Abnormal; Notable for the following components:  MCV 103.9 (*)    MCH 34.8 (*)    All other components within normal limits  ETHANOL  I-STAT TROPONIN, ED  POCT I-STAT TROPONIN I    EKG EKG Interpretation  Date/Time:  Sunday October 22 2018 04:37:26 EDT Ventricular Rate:  104 PR Interval:    QRS Duration: 91 QT Interval:  353 QTC Calculation: 465 R Axis:   91 Text Interpretation:  Sinus tachycardia Borderline right axis deviation Low voltage, precordial leads No significant change since last tracing Confirmed by Lorre Nick ([id number]) on 10/22/2018 6:54:07 AM   Radiology Dg Chest 2 View  Result Date: 10/22/2018 CLINICAL DATA:  57 y/o M; chest pain and syncope. One week of cough. EXAM: CHEST - 2 VIEW COMPARISON:  10/15/2018 chest radiograph FINDINGS: Stable heart size and mediastinal contours are within normal limits. Both lungs are clear. The visualized skeletal structures are unremarkable. IMPRESSION: No acute pulmonary process identified. Electronically Signed   By: Mitzi Hansen M.D.   On: 10/22/2018 05:03    Procedures Procedures (including critical care time)  Medications Ordered in ED Medications - No data to display   Initial Impression / Assessment and Plan / ED Course  I  have reviewed the triage vital signs and the nursing notes.  Pertinent labs & imaging results that were available during my care of the patient were reviewed by me and considered in my medical decision making (see chart for details).     Patient here with chest wall pain but setting of syncope with chest pain had a d-dimer.  Elevated and chest CT negative.  Alcohol level is elevated as well 2.  Patient's delta troponin negative.  Suspect some alcohol involvement with his syncope.  He is been hemodynamically stable here and will be discharged home with return precautions  Final Clinical Impressions(s) / ED Diagnoses   Final diagnoses:  None    ED Discharge Orders    None       Lorre Nick, MD 10/22/18 1153

## 2018-10-22 NOTE — ED Triage Notes (Signed)
Patient had a syncopal episode and fell. Patient has a c collar on. Patient brought by [facility]. Patient had EKG- sinus tach. Patient has chest pain on the left side. Patient states this has been going on for two weeks.

## 2018-10-22 NOTE — ED Notes (Signed)
Patient transported to CT 

## 2018-10-23 ENCOUNTER — Encounter (HOSPITAL_COMMUNITY): Payer: Self-pay | Admitting: Emergency Medicine

## 2018-10-23 ENCOUNTER — Emergency Department (HOSPITAL_COMMUNITY)
Admission: EM | Admit: 2018-10-23 | Discharge: 2018-10-24 | Disposition: A | Discharge status: Other Acute Inpatient Hospital | Payer: Medicare Other | Attending: Emergency Medicine | Admitting: Emergency Medicine

## 2018-10-23 ENCOUNTER — Other Ambulatory Visit: Payer: Self-pay

## 2018-10-23 DIAGNOSIS — Z7984 Long term (current) use of oral hypoglycemic drugs: Secondary | ICD-10-CM | POA: Insufficient documentation

## 2018-10-23 DIAGNOSIS — R45851 Suicidal ideations: Secondary | ICD-10-CM | POA: Insufficient documentation

## 2018-10-23 DIAGNOSIS — F17210 Nicotine dependence, cigarettes, uncomplicated: Secondary | ICD-10-CM | POA: Insufficient documentation

## 2018-10-23 DIAGNOSIS — Z79899 Other long term (current) drug therapy: Secondary | ICD-10-CM | POA: Insufficient documentation

## 2018-10-23 DIAGNOSIS — F1010 Alcohol abuse, uncomplicated: Secondary | ICD-10-CM

## 2018-10-23 DIAGNOSIS — I252 Old myocardial infarction: Secondary | ICD-10-CM | POA: Insufficient documentation

## 2018-10-23 DIAGNOSIS — I10 Essential (primary) hypertension: Secondary | ICD-10-CM | POA: Insufficient documentation

## 2018-10-23 DIAGNOSIS — F332 Major depressive disorder, recurrent severe without psychotic features: Secondary | ICD-10-CM | POA: Insufficient documentation

## 2018-10-23 DIAGNOSIS — Z7982 Long term (current) use of aspirin: Secondary | ICD-10-CM | POA: Insufficient documentation

## 2018-10-23 DIAGNOSIS — F1020 Alcohol dependence, uncomplicated: Secondary | ICD-10-CM | POA: Insufficient documentation

## 2018-10-23 DIAGNOSIS — I2510 Atherosclerotic heart disease of native coronary artery without angina pectoris: Secondary | ICD-10-CM | POA: Insufficient documentation

## 2018-10-23 DIAGNOSIS — J449 Chronic obstructive pulmonary disease, unspecified: Secondary | ICD-10-CM | POA: Insufficient documentation

## 2018-10-23 DIAGNOSIS — E119 Type 2 diabetes mellitus without complications: Secondary | ICD-10-CM | POA: Insufficient documentation

## 2018-10-23 NOTE — ED Triage Notes (Signed)
Patient here for SI and ETOH abuse, states to EMS that he wants help.  Last drink earlier today, beer.  Patient stated he wants to shoot himself, but has no access to guns at this time.

## 2018-10-24 ENCOUNTER — Other Ambulatory Visit: Payer: Self-pay

## 2018-10-24 ENCOUNTER — Inpatient Hospital Stay (HOSPITAL_COMMUNITY)
Admission: AD | Admit: 2018-10-24 | Discharge: 2018-10-27 | DRG: 885 | Disposition: A | Discharge status: Home / Self Care | Payer: Medicare Other | Source: Intra-hospital | Attending: Psychiatry | Admitting: Psychiatry

## 2018-10-24 ENCOUNTER — Encounter (HOSPITAL_COMMUNITY): Payer: Self-pay | Admitting: Emergency Medicine

## 2018-10-24 DIAGNOSIS — R45851 Suicidal ideations: Secondary | ICD-10-CM | POA: Diagnosis present

## 2018-10-24 DIAGNOSIS — Z811 Family history of alcohol abuse and dependence: Secondary | ICD-10-CM | POA: Diagnosis not present

## 2018-10-24 DIAGNOSIS — F1490 Cocaine use, unspecified, uncomplicated: Secondary | ICD-10-CM | POA: Diagnosis present

## 2018-10-24 DIAGNOSIS — I252 Old myocardial infarction: Secondary | ICD-10-CM | POA: Diagnosis not present

## 2018-10-24 DIAGNOSIS — Z87891 Personal history of nicotine dependence: Secondary | ICD-10-CM

## 2018-10-24 DIAGNOSIS — F332 Major depressive disorder, recurrent severe without psychotic features: Principal | ICD-10-CM | POA: Diagnosis present

## 2018-10-24 DIAGNOSIS — Z590 Homelessness: Secondary | ICD-10-CM

## 2018-10-24 DIAGNOSIS — I10 Essential (primary) hypertension: Secondary | ICD-10-CM | POA: Diagnosis present

## 2018-10-24 DIAGNOSIS — Z7984 Long term (current) use of oral hypoglycemic drugs: Secondary | ICD-10-CM

## 2018-10-24 DIAGNOSIS — G4700 Insomnia, unspecified: Secondary | ICD-10-CM | POA: Diagnosis present

## 2018-10-24 DIAGNOSIS — Z7982 Long term (current) use of aspirin: Secondary | ICD-10-CM | POA: Diagnosis not present

## 2018-10-24 DIAGNOSIS — I2510 Atherosclerotic heart disease of native coronary artery without angina pectoris: Secondary | ICD-10-CM | POA: Diagnosis present

## 2018-10-24 DIAGNOSIS — Z8249 Family history of ischemic heart disease and other diseases of the circulatory system: Secondary | ICD-10-CM

## 2018-10-24 DIAGNOSIS — Y908 Blood alcohol level of 240 mg/100 ml or more: Secondary | ICD-10-CM | POA: Diagnosis present

## 2018-10-24 DIAGNOSIS — F17210 Nicotine dependence, cigarettes, uncomplicated: Secondary | ICD-10-CM | POA: Diagnosis present

## 2018-10-24 DIAGNOSIS — Z79899 Other long term (current) drug therapy: Secondary | ICD-10-CM

## 2018-10-24 DIAGNOSIS — F1024 Alcohol dependence with alcohol-induced mood disorder: Secondary | ICD-10-CM

## 2018-10-24 DIAGNOSIS — Z818 Family history of other mental and behavioral disorders: Secondary | ICD-10-CM | POA: Diagnosis not present

## 2018-10-24 DIAGNOSIS — F10239 Alcohol dependence with withdrawal, unspecified: Secondary | ICD-10-CM | POA: Diagnosis present

## 2018-10-24 DIAGNOSIS — Z833 Family history of diabetes mellitus: Secondary | ICD-10-CM

## 2018-10-24 DIAGNOSIS — E118 Type 2 diabetes mellitus with unspecified complications: Secondary | ICD-10-CM | POA: Diagnosis present

## 2018-10-24 DIAGNOSIS — F4010 Social phobia, unspecified: Secondary | ICD-10-CM | POA: Diagnosis present

## 2018-10-24 DIAGNOSIS — Z7902 Long term (current) use of antithrombotics/antiplatelets: Secondary | ICD-10-CM

## 2018-10-24 DIAGNOSIS — Z23 Encounter for immunization: Secondary | ICD-10-CM

## 2018-10-24 DIAGNOSIS — J449 Chronic obstructive pulmonary disease, unspecified: Secondary | ICD-10-CM | POA: Diagnosis present

## 2018-10-24 DIAGNOSIS — F1120 Opioid dependence, uncomplicated: Secondary | ICD-10-CM | POA: Diagnosis present

## 2018-10-24 DIAGNOSIS — F419 Anxiety disorder, unspecified: Secondary | ICD-10-CM | POA: Diagnosis present

## 2018-10-24 DIAGNOSIS — Z955 Presence of coronary angioplasty implant and graft: Secondary | ICD-10-CM

## 2018-10-24 DIAGNOSIS — Z7951 Long term (current) use of inhaled steroids: Secondary | ICD-10-CM

## 2018-10-24 LAB — COMPREHENSIVE METABOLIC PANEL
ALBUMIN: 3.9 g/dL (ref 3.5–5.0)
ALT: 34 U/L (ref 0–44)
ANION GAP: 13 (ref 5–15)
AST: 62 U/L — ABNORMAL HIGH (ref 15–41)
Alkaline Phosphatase: 78 U/L (ref 38–126)
BUN: <5 mg/dL — ABNORMAL LOW (ref 6–20)
CHLORIDE: 105 mmol/L (ref 98–111)
CO2: 20 mmol/L — ABNORMAL LOW (ref 22–32)
CREATININE: 0.58 mg/dL — AB (ref 0.61–1.24)
Calcium: 9.2 mg/dL (ref 8.9–10.3)
GFR calc Af Amer: >60 mL/min (ref >60)
GFR calc non Af Amer: >60 mL/min (ref >60)
Glucose, Bld: 115 mg/dL — ABNORMAL HIGH (ref 70–99)
Potassium: 3.4 mmol/L — ABNORMAL LOW (ref 3.5–5.1)
SODIUM: 138 mmol/L (ref 135–145)
Total Bilirubin: 0.6 mg/dL (ref 0.3–1.2)
Total Protein: 7.2 g/dL (ref 6.5–8.1)

## 2018-10-24 LAB — CBC
HCT: 46.1 % (ref 39.0–52.0)
Hemoglobin: 15.3 g/dL (ref 13.0–17.0)
MCH: 33.9 pg (ref 26.0–34.0)
MCHC: 33.2 g/dL (ref 30.0–36.0)
MCV: 102.2 fL — AB (ref 80.0–100.0)
NRBC: 0 % (ref 0.0–0.2)
PLATELETS: 161 10*3/uL (ref 150–400)
RBC: 4.51 MIL/uL (ref 4.22–5.81)
RDW: 12.3 % (ref 11.5–15.5)
WBC: 9.7 10*3/uL (ref 4.0–10.5)

## 2018-10-24 LAB — RAPID URINE DRUG SCREEN, HOSP PERFORMED
Amphetamines: NOT DETECTED
BENZODIAZEPINES: NOT DETECTED
Barbiturates: NOT DETECTED
Cocaine: NOT DETECTED
OPIATES: NOT DETECTED
Tetrahydrocannabinol: NOT DETECTED

## 2018-10-24 LAB — SALICYLATE LEVEL

## 2018-10-24 LAB — ETHANOL: ALCOHOL ETHYL (B): 251 mg/dL — AB (ref <10)

## 2018-10-24 LAB — ACETAMINOPHEN LEVEL

## 2018-10-24 MED ORDER — PANTOPRAZOLE SODIUM 40 MG PO TBEC
40.0000 mg | DELAYED_RELEASE_TABLET | Freq: Every day | ORAL | Status: DC
  Administered 2018-10-24: 40 mg via ORAL
  Filled 2018-10-24: qty 1

## 2018-10-24 MED ORDER — LORAZEPAM 1 MG PO TABS
0.0000 mg | ORAL_TABLET | Freq: Four times a day (QID) | ORAL | Status: DC
  Administered 2018-10-24 (×2): 1 mg via ORAL
  Filled 2018-10-24 (×2): qty 1

## 2018-10-24 MED ORDER — LORAZEPAM 2 MG/ML IJ SOLN: 0.0000 mg | Freq: Two times a day (BID) | INTRAMUSCULAR | Status: DC

## 2018-10-24 MED ORDER — VITAMIN B-1 100 MG PO TABS
100.0000 mg | ORAL_TABLET | Freq: Every day | ORAL | Status: DC
  Administered 2018-10-25 – 2018-10-27 (×3): 100 mg via ORAL
  Filled 2018-10-24 (×5): qty 1

## 2018-10-24 MED ORDER — MOMETASONE FURO-FORMOTEROL FUM 200-5 MCG/ACT IN AERO
2.0000 | INHALATION_SPRAY | Freq: Two times a day (BID) | RESPIRATORY_TRACT | Status: DC
  Administered 2018-10-24: 2 via RESPIRATORY_TRACT
  Filled 2018-10-24: qty 8.8

## 2018-10-24 MED ORDER — ADULT MULTIVITAMIN W/MINERALS CH
1.0000 | ORAL_TABLET | Freq: Every day | ORAL | Status: DC
  Administered 2018-10-24 – 2018-10-27 (×4): 1 via ORAL
  Filled 2018-10-24 (×6): qty 1

## 2018-10-24 MED ORDER — METOPROLOL TARTRATE 25 MG PO TABS
50.0000 mg | ORAL_TABLET | Freq: Two times a day (BID) | ORAL | Status: DC
  Administered 2018-10-24: 50 mg via ORAL
  Filled 2018-10-24: qty 2

## 2018-10-24 MED ORDER — TICAGRELOR 90 MG PO TABS
90.0000 mg | ORAL_TABLET | Freq: Two times a day (BID) | ORAL | Status: DC
  Administered 2018-10-24 – 2018-10-27 (×6): 90 mg via ORAL
  Filled 2018-10-24 (×10): qty 1

## 2018-10-24 MED ORDER — NITROGLYCERIN 0.4 MG SL SUBL: 0.4000 mg | SUBLINGUAL_TABLET | SUBLINGUAL | Status: DC | PRN

## 2018-10-24 MED ORDER — ALBUTEROL SULFATE HFA 108 (90 BASE) MCG/ACT IN AERS: 1.0000 | INHALATION_SPRAY | Freq: Four times a day (QID) | RESPIRATORY_TRACT | Status: DC | PRN

## 2018-10-24 MED ORDER — METFORMIN HCL 500 MG PO TABS
500.0000 mg | ORAL_TABLET | Freq: Two times a day (BID) | ORAL | Status: DC
  Administered 2018-10-24 – 2018-10-27 (×6): 500 mg via ORAL
  Filled 2018-10-24 (×10): qty 1

## 2018-10-24 MED ORDER — LISINOPRIL 2.5 MG PO TABS
5.0000 mg | ORAL_TABLET | Freq: Every day | ORAL | Status: DC
  Administered 2018-10-24: 5 mg via ORAL
  Filled 2018-10-24: qty 2

## 2018-10-24 MED ORDER — ISOSORBIDE MONONITRATE ER 30 MG PO TB24
30.0000 mg | ORAL_TABLET | Freq: Every day | ORAL | Status: DC
  Administered 2018-10-24: 30 mg via ORAL
  Filled 2018-10-24: qty 1

## 2018-10-24 MED ORDER — VITAMIN B-1 100 MG PO TABS
100.0000 mg | ORAL_TABLET | Freq: Every day | ORAL | Status: DC
  Administered 2018-10-24: 100 mg via ORAL
  Filled 2018-10-24: qty 1

## 2018-10-24 MED ORDER — PANTOPRAZOLE SODIUM 40 MG PO TBEC
40.0000 mg | DELAYED_RELEASE_TABLET | Freq: Every day | ORAL | Status: DC
  Administered 2018-10-25 – 2018-10-27 (×3): 40 mg via ORAL
  Filled 2018-10-24 (×5): qty 1

## 2018-10-24 MED ORDER — HYDROXYZINE HCL 25 MG PO TABS
25.0000 mg | ORAL_TABLET | Freq: Four times a day (QID) | ORAL | Status: AC | PRN
  Administered 2018-10-24 – 2018-10-26 (×2): 25 mg via ORAL
  Filled 2018-10-24 (×2): qty 1

## 2018-10-24 MED ORDER — THIAMINE HCL 100 MG/ML IJ SOLN: 100.0000 mg | Freq: Once | INTRAMUSCULAR | Status: DC

## 2018-10-24 MED ORDER — LISINOPRIL 5 MG PO TABS
5.0000 mg | ORAL_TABLET | Freq: Every day | ORAL | Status: DC
  Administered 2018-10-25 – 2018-10-27 (×3): 5 mg via ORAL
  Filled 2018-10-24 (×5): qty 1

## 2018-10-24 MED ORDER — LORAZEPAM 1 MG PO TABS: 0.0000 mg | ORAL_TABLET | Freq: Two times a day (BID) | ORAL | Status: DC

## 2018-10-24 MED ORDER — ASPIRIN EC 81 MG PO TBEC
81.0000 mg | DELAYED_RELEASE_TABLET | Freq: Every day | ORAL | Status: DC
  Administered 2018-10-25 – 2018-10-27 (×3): 81 mg via ORAL
  Filled 2018-10-24 (×5): qty 1

## 2018-10-24 MED ORDER — TICAGRELOR 90 MG PO TABS
90.0000 mg | ORAL_TABLET | Freq: Two times a day (BID) | ORAL | Status: DC
  Administered 2018-10-24: 90 mg via ORAL
  Filled 2018-10-24 (×2): qty 1

## 2018-10-24 MED ORDER — THIAMINE HCL 100 MG/ML IJ SOLN: 100.0000 mg | Freq: Every day | INTRAMUSCULAR | Status: DC

## 2018-10-24 MED ORDER — LORAZEPAM 2 MG/ML IJ SOLN: 0.0000 mg | Freq: Four times a day (QID) | INTRAMUSCULAR | Status: DC

## 2018-10-24 MED ORDER — ATORVASTATIN CALCIUM 80 MG PO TABS: 80.0000 mg | ORAL_TABLET | Freq: Every day | ORAL | Status: DC

## 2018-10-24 MED ORDER — ONDANSETRON 4 MG PO TBDP: 4.0000 mg | ORAL_TABLET | Freq: Four times a day (QID) | ORAL | Status: AC | PRN

## 2018-10-24 MED ORDER — METFORMIN HCL 500 MG PO TABS
500.0000 mg | ORAL_TABLET | Freq: Two times a day (BID) | ORAL | Status: DC
  Administered 2018-10-24: 500 mg via ORAL
  Filled 2018-10-24: qty 1

## 2018-10-24 MED ORDER — ISOSORBIDE MONONITRATE ER 30 MG PO TB24
30.0000 mg | ORAL_TABLET | Freq: Every day | ORAL | Status: DC
  Administered 2018-10-25 – 2018-10-27 (×3): 30 mg via ORAL
  Filled 2018-10-24 (×5): qty 1

## 2018-10-24 MED ORDER — INFLUENZA VAC SPLIT QUAD 0.5 ML IM SUSY
0.5000 mL | PREFILLED_SYRINGE | INTRAMUSCULAR | Status: DC
  Filled 2018-10-24: qty 0.5

## 2018-10-24 MED ORDER — DOXYCYCLINE HYCLATE 100 MG PO TABS
100.0000 mg | ORAL_TABLET | Freq: Two times a day (BID) | ORAL | Status: DC
  Administered 2018-10-24: 100 mg via ORAL
  Filled 2018-10-24: qty 1

## 2018-10-24 MED ORDER — DOXYCYCLINE HYCLATE 100 MG PO TABS
100.0000 mg | ORAL_TABLET | Freq: Two times a day (BID) | ORAL | Status: DC
  Administered 2018-10-25: 100 mg via ORAL
  Filled 2018-10-24 (×6): qty 1

## 2018-10-24 MED ORDER — ASPIRIN EC 81 MG PO TBEC
81.0000 mg | DELAYED_RELEASE_TABLET | Freq: Every day | ORAL | Status: DC
  Administered 2018-10-24: 81 mg via ORAL
  Filled 2018-10-24: qty 1

## 2018-10-24 MED ORDER — ATORVASTATIN CALCIUM 80 MG PO TABS
80.0000 mg | ORAL_TABLET | Freq: Every day | ORAL | Status: DC
  Administered 2018-10-24 – 2018-10-26 (×3): 80 mg via ORAL
  Filled 2018-10-24 (×5): qty 1

## 2018-10-24 MED ORDER — LOPERAMIDE HCL 2 MG PO CAPS: 2.0000 mg | ORAL_CAPSULE | ORAL | Status: AC | PRN

## 2018-10-24 MED ORDER — NICOTINE 21 MG/24HR TD PT24
21.0000 mg | MEDICATED_PATCH | Freq: Every day | TRANSDERMAL | Status: DC
  Administered 2018-10-24: 21 mg via TRANSDERMAL
  Filled 2018-10-24: qty 1

## 2018-10-24 MED ORDER — METOPROLOL TARTRATE 50 MG PO TABS
50.0000 mg | ORAL_TABLET | Freq: Two times a day (BID) | ORAL | Status: DC
  Administered 2018-10-24 – 2018-10-27 (×6): 50 mg via ORAL
  Filled 2018-10-24 (×10): qty 1

## 2018-10-24 MED ORDER — LORAZEPAM 1 MG PO TABS: 1.0000 mg | ORAL_TABLET | Freq: Four times a day (QID) | ORAL | Status: AC | PRN

## 2018-10-24 MED ORDER — NICOTINE 21 MG/24HR TD PT24
21.0000 mg | MEDICATED_PATCH | Freq: Every day | TRANSDERMAL | Status: DC
  Administered 2018-10-25 – 2018-10-27 (×3): 21 mg via TRANSDERMAL
  Filled 2018-10-24 (×5): qty 1

## 2018-10-24 MED ORDER — DOXYCYCLINE HYCLATE 100 MG PO CAPS: 100.0000 mg | ORAL_CAPSULE | Freq: Two times a day (BID) | ORAL | Status: DC

## 2018-10-24 NOTE — ED Notes (Signed)
Regular Diet was ordered for Lunch. 

## 2018-10-24 NOTE — ED Notes (Signed)
PA at bedside.

## 2018-10-24 NOTE — Progress Notes (Signed)
Patient ID: Warren Salas, male   DOB: Jun 27, 1961, 57 y.o.   MRN: [id number] PER STATE REGULATIONS 482.30  THIS CHART WAS REVIEWED FOR MEDICAL NECESSITY WITH RESPECT TO THE PATIENT'S ADMISSION/DURATION OF STAY.  NEXT REVIEW DATE:10/28/18  Loura Halt, RN, BSN CASE MANAGER

## 2018-10-24 NOTE — ED Notes (Signed)
ALL belongings - 1 labeled belongings bag and 1 valuables envelope - [place] - Pt aware.  

## 2018-10-24 NOTE — BH Assessment (Addendum)
Assessment Note  Warren Salas is an 57 y.o. male presenting voluntarily to [facility] ED via EMS complaining of suicidal ideation and alcohol addiction. Patient reports "I don't want to live this life any more. Drinking has ruined my life." Patient is unable to provide assessor with plan but reported to EDP he had passive thoughts of shooting himself. Patient does not have access to firearms. Patient is a disabled veteran. Patient reports no history mental health treatment and stated that he was in rehab "a long long time ago" but is unable to provide clinician with detail about location and dates. Patient states he drinks every morning to prevent withdrawal. Patient reports feeling like he is experiencing alcohol withdrawals. Patient reports occasional cocaine use, his last use being yesterday. Patient endorses depressive symptoms of hopelessness, worthlessness, loss of pleasure, and irritability. Patient reports losing both of his parents and his 2 brothers. Patient stated he is divorced, lives alone and does not have family supports. Patient denies HI and AVH. Patient stated "don't send me to the crazy hospital. I need help with alcohol." Patient denies history of abuse and does not have any current criminal charges.  Patient is alert and oriented x4. He is a poor historian regarding his history of alcohol use and only provides vague responses. Patient's mood is depressed and affect is congruent. His insight, impulse control, and judgement are poor. He does not appear to be responding to internal stimuli or experiencing delusional thought content.  Diagnosis: F33.2 MDD, recurrent, severe   F10.20 Alcohol use disorder, severe  Past Medical History:  Past Medical History:  Diagnosis Date  . Back pain   . COPD (chronic obstructive pulmonary disease) (HCC)   . Diabetes mellitus without complication (HCC)   . Hypertension   . STEMI (ST elevation myocardial infarction) (HCC) 10/2017    Past Surgical  History:  Procedure Laterality Date  . APPENDECTOMY    . CORONARY STENT INTERVENTION N/A 12/29/2017   Procedure: CORONARY STENT INTERVENTION - LAD;  Surgeon: Corky Crafts, MD;  Location: [facility] INVAS[facility] CV LAB;  Service: Cardiovascular;  Laterality: N/A;  . CORONARY/GRAFT ACUTE MI REVASCULARIZATION N/A 11/01/2017   Procedure: Coronary/Graft Acute MI Revascularization;  Surgeon: Corky Crafts, MD;  Location: [facility] INVASIVE CV LAB;  Service: Cardiovascular;  Laterality: N/A;  . LEFT HEART CATH AND CORONARY ANGIOGRAPHY N/A 11/01/2017   Procedure: LEFT HEART CATH AND CORONARY ANGIOGRAPHY;  Surgeon: Corky Crafts, MD;  Location: [facility] INVASIVE CV LAB;  Service: Cardiovascular;  Laterality: N/A;    Family History:  Family History  Problem Relation Age of Onset  . Hypertension Mother   . Diabetes Mother   . Hypertension Father   . Diabetes Father     Social History:  reports that he has been smoking cigarettes. He has been smoking about 1.00 pack per day. He has never used smokeless tobacco. He reports that he drinks alcohol. He reports that he has current or past drug history.  Additional Social History:  Alcohol / Drug Use Pain Medications: see MAR Prescriptions: see MAR Over the Counter: see MAR History of alcohol / drug use?: Yes Longest period of sobriety (when/how long): unknown Substance #1 Name of Substance 1: Alcohol 1 - Age of First Use: UTA 1 - Amount (size/oz): UTA 1 - Frequency: daily, every morning 1 - Duration: "years" 1 - Last Use / Amount: last night, unknown amount Substance #2 Name of Substance 2: Cocaine 2 - Age of First Use: UTA 2 - Amount (size/oz):  UTA 2 - Frequency: "not that often" 2 - Duration: "I couldn't tell you" 2 - Last Use / Amount: Yesterday  CIWA: CIWA-Ar BP: (!) 128/92 Pulse Rate: 86 Nausea and Vomiting: no nausea and no vomiting Tactile Disturbances: mild itching, pins and needles, burning or numbness Tremor: not visible, but can be  felt fingertip to fingertip Auditory Disturbances: not present Paroxysmal Sweats: barely perceptible sweating, palms moist Visual Disturbances: not present Anxiety: mildly anxious Headache, Fullness in Head: none present Agitation: normal activity Orientation and Clouding of Sensorium: oriented and can do serial additions CIWA-Ar Total: 5 COWS:    Allergies: No Known Allergies  Home Medications:  (Not in a hospital admission)  OB/GYN Status:  No LMP for male patient.  General Assessment Data Location of Assessment: [facility] ED TTS Assessment: In system Is this a Tele or Face-to-Face Assessment?: Face-to-Face Is this an Initial Assessment or a Re-assessment for this encounter?: Initial Assessment Patient Accompanied by:: (self) Language Other than English: No Living Arrangements: Other (Comment)(home) What gender do you identify as?: Male Marital status: Divorced Jordan name: [place]in[place] Pregnancy Status: No Living Arrangements: Alone Can pt return to current living arrangement?: Yes Admission Status: Voluntary Is patient capable of signing voluntary admission?: Yes Referral Source: Self/Family/Friend Insurance type: Medicare     Crisis Care Plan Living Arrangements: Alone     Risk to self with the past 6 months Suicidal Ideation: Yes-Currently Present Has patient been a risk to self within the past 6 months prior to admission? : Yes Suicidal Intent: No Has patient had any suicidal intent within the past 6 months prior to admission? : No Is patient at risk for suicide?: No Suicidal Plan?: Yes-Currently Present(shooting himself, does not have firearm) Has patient had any suicidal plan within the past 6 months prior to admission? : No Access to Means: No What has been your use of drugs/alcohol within the last 12 months?: daily alcohol use Previous Attempts/Gestures: No How many times?: 0 Other Self Harm Risks: drug and alcohol use Triggers for Past Attempts:  (none) Intentional Self Injurious Behavior: None Family Suicide History: No Recent stressful life event(s): (alcohol use) Persecutory voices/beliefs?: No Depression: Yes Depression Symptoms: Despondent, Insomnia, Tearfulness, Isolating, Loss of interest in usual pleasures, Feeling worthless/self pity, Feeling angry/irritable Substance abuse history and/or treatment for substance abuse?: Yes("along long time ago") Suicide prevention information given to non-admitted patients: Not applicable  Risk to Others within the past 6 months Homicidal Ideation: No Does patient have any lifetime risk of violence toward others beyond the six months prior to admission? : No Thoughts of Harm to Others: No Current Homicidal Intent: No Current Homicidal Plan: No Access to Homicidal Means: No Identified Victim: none History of harm to others?: No Assessment of Violence: None Noted Violent Behavior Description: none Does patient have access to weapons?: No Criminal Charges Pending?: No Does patient have a court date: No Is patient on probation?: No  Psychosis Hallucinations: None noted Delusions: None noted  Mental Status Report Appearance/Hygiene: In scrubs, Bizarre Eye Contact: Fair Motor Activity: Unremarkable Speech: Logical/coherent Level of Consciousness: Quiet/awake Mood: Anxious, Depressed, Irritable Affect: Appropriate to circumstance Anxiety Level: Minimal Thought Processes: Coherent, Relevant Judgement: Impaired Orientation: Person, Place, Time, Situation Obsessive Compulsive Thoughts/Behaviors: None  Cognitive Functioning Concentration: Poor Memory: Recent Intact Is patient IDD: No Insight: Fair Impulse Control: Poor Appetite: Poor Have you had any weight changes? : No Change Sleep: Decreased Total Hours of Sleep: 5 Vegetative Symptoms: None  ADLScreening [facility] Assessment Services) Patient's cognitive ability  adequate to safely complete daily activities?: Yes Patient  able to express need for assistance with ADLs?: Yes Independently performs ADLs?: Yes (appropriate for developmental age)  Prior Inpatient Therapy Prior Inpatient Therapy: No  Prior Outpatient Therapy Prior Outpatient Therapy: No Does patient have an ACCT team?: No Does patient have Intensive In-House Services?  : No Does patient have [facility] services? : No Does patient have P4CC services?: No  ADL Screening (condition at time of admission) Patient's cognitive ability adequate to safely complete daily activities?: Yes Is the patient deaf or have difficulty hearing?: No Does the patient have difficulty seeing, even when wearing glasses/contacts?: No Does the patient have difficulty concentrating, remembering, or making decisions?: No Patient able to express need for assistance with ADLs?: Yes Does the patient have difficulty dressing or bathing?: No Independently performs ADLs?: Yes (appropriate for developmental age) Does the patient have difficulty walking or climbing stairs?: No Weakness of Legs: None Weakness of Arms/Hands: None     Therapy Consults (therapy consults require a physician order) PT Evaluation Needed: No OT Evalulation Needed: No SLP Evaluation Needed: No Abuse/Neglect Assessment (Assessment to be complete while patient is alone) Abuse/Neglect Assessment Can Be Completed: Yes Physical Abuse: Denies Verbal Abuse: Denies Sexual Abuse: Denies Exploitation of patient/patient's resources: Denies Self-Neglect: Denies Values / Beliefs Cultural Requests During Hospitalization: None Spiritual Requests During Hospitalization: None Consults Spiritual Care Consult Needed: No Social Work Consult Needed: No Merchant navy officer (For Healthcare) Does Patient Have a Medical Advance Directive?: No Would patient like information on creating a medical advance directive?: No - Patient declined          Disposition: Per Assunta Found, NP patient meets in patient  criteria. TTS to seek placement. Disposition Initial Assessment Completed for this Encounter: Yes  On Site Evaluation by:   Reviewed with Physician:    Celedonio Miyamoto 10/24/2018 10:02 AM

## 2018-10-24 NOTE — ED Provider Notes (Signed)
[facility] EMERGENCY DEPARTMENT Provider Note   CSN: [id number] Arrival date & time: 10/23/18  2335     History   Chief Complaint Chief Complaint  Patient presents with  . Suicidal    HPI Warren Salas is a 57 y.o. male.  The history is provided by the patient and medical records.    57 y.o. M with hx of COPD, DM, HTN, CAD with prior MI x2 and stenting x5, presenting to the ED for SI.  Patient reports he has lost almost everyone important to him in his life-- 2 brothers, both parents.  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  He states to me "this is not a life, I would rather be dead."  He has had thoughts of shooting himself but does not have any weapons at home.  He has not made any attempts other than "drinking himself sick".  Last drink was earlier today, does report some cocaine use yesterday.  He denies any homicidal ideation, no hallucinations.  Patient does not have OP psychiatrist that he sees.  Not currently on any psychiatric medications.  Past Medical History:  Diagnosis Date  . Back pain   . COPD (chronic obstructive pulmonary disease) (HCC)   . Diabetes mellitus without complication (HCC)   . Hypertension   . STEMI (ST elevation myocardial infarction) (HCC) 10/2017    Patient Active Problem List   Diagnosis Date Noted  . CAD (coronary artery disease) 12/12/2017  . Old MI (myocardial infarction) 12/12/2017  . Tobacco abuse 12/12/2017  . Type 2 diabetes mellitus with complication, without long-term current use of insulin (HCC) 12/12/2017  . Acute MI, inferior wall (HCC) 11/01/2017  . Acute inferior myocardial infarction [facility])     Past Surgical History:  Procedure Laterality Date  . APPENDECTOMY    . CORONARY STENT INTERVENTION N/A 12/29/2017   Procedure: CORONARY STENT INTERVENTION - LAD;  Surgeon: Corky Crafts, MD;  Location: [facility] INVASIVE CV LAB;   Service: Cardiovascular;  Laterality: N/A;  . CORONARY/GRAFT ACUTE MI REVASCULARIZATION N/A 11/01/2017   Procedure: Coronary/Graft Acute MI Revascularization;  Surgeon: Corky Crafts, MD;  Location: [facility] INVASIVE CV LAB;  Service: Cardiovascular;  Laterality: N/A;  . LEFT HEART CATH AND CORONARY ANGIOGRAPHY N/A 11/01/2017   Procedure: LEFT HEART CATH AND CORONARY ANGIOGRAPHY;  Surgeon: Corky Crafts, MD;  Location: [facility] INVASIVE CV LAB;  Service: Cardiovascular;  Laterality: N/A;        Home Medications    Prior to Admission medications   Medication Sig Start Date End Date Taking? Authorizing Provider  albuterol (PROVENTIL HFA;VENTOLIN HFA) 108 (90 Base) MCG/ACT inhaler Inhale 1-2 puffs into the lungs every 6 (six) hours as needed for wheezing. 10/11/18   Garlon Hatchet, PA-C  aspirin EC 81 MG tablet Take 81 mg by mouth daily. 05/13/15   [provider]  atorvastatin (LIPITOR) 80 MG tablet Take 1 tablet (80 mg total) by mouth daily at 6 PM. 12/12/17   Corky Crafts, MD  budesonide-formoterol [facility]) 160-4.5 MCG/ACT inhaler Inhale 2 puffs into the lungs 2 (two) times daily as needed (For shortness of breath.).     [provider]  doxycycline (VIBRAMYCIN) 100 MG capsule Take 1 capsule (100 mg total) by mouth 2 (two) times daily. 10/11/18   Garlon Hatchet, PA-C  isosorbide mononitrate (IMDUR) 30 MG 24 hr tablet Take 1 tablet (30  mg total) by mouth daily. 12/12/17   Corky Crafts, MD  lisinopril (PRINIVIL,ZESTRIL) 10 MG tablet Take 0.5 tablets (5 mg total) by mouth daily. 12/12/17   Corky Crafts, MD  metFORMIN (GLUCOPHAGE) 500 MG tablet Take 500 mg by mouth 2 (two) times daily. 01/26/16   [provider]  metoprolol tartrate (LOPRESSOR) 50 MG tablet Take 1 tablet (50 mg total) by mouth 2 (two) times daily. 12/12/17   Corky Crafts, MD  nitroGLYCERIN (NITROSTAT) 0.4 MG SL tablet Place 1 tablet (0.4 mg total) under the tongue every  5 (five) minutes as needed. 12/29/17   Arty Baumgartner, NP  omeprazole (PRILOSEC) 20 MG capsule Take 1 capsule (20 mg total) by mouth daily. 10/11/18   Garlon Hatchet, PA-C  predniSONE (DELTASONE) 20 MG tablet Take 40 mg by mouth daily for 3 days, then 20mg  by mouth daily for 3 days, then 10mg  daily for 3 days 10/11/18   Garlon Hatchet, PA-C  ticagrelor (BRILINTA) 90 MG TABS tablet Take 1 tablet (90 mg total) by mouth 2 (two) times daily. 12/12/17   Corky Crafts, MD    Family History Family History  Problem Relation Age of Onset  . Hypertension Mother   . Diabetes Mother   . Hypertension Father   . Diabetes Father     Social History Social History   Tobacco Use  . Smoking status: Current Every Day Smoker    Packs/day: 1.00    Types: Cigarettes  . Smokeless tobacco: Never Used  Substance Use Topics  . Alcohol use: Yes  . Drug use: Yes    Comment: heroin      Allergies   Patient has no known allergies.   Review of Systems Review of Systems  Psychiatric/Behavioral: Positive for suicidal ideas.  All other systems reviewed and are negative.    Physical Exam Updated Vital Signs BP (!) 155/95 (BP Location: Right Arm)   Pulse 98   Temp 97.7 F (36.5 C) (Oral)   Resp 16   SpO2 98%   Physical Exam  Constitutional: He is oriented to person, place, and time. He appears well-developed and well-nourished.  Appears intoxicated, breath smells heavily of EtOH  HENT:  Head: Normocephalic and atraumatic.  Mouth/Throat: Oropharynx is clear and moist.  Eyes: Pupils are equal, round, and reactive to light. Conjunctivae and EOM are normal.  Neck: Normal range of motion.  Cardiovascular: Normal rate, regular rhythm and normal heart sounds.  Pulmonary/Chest: Effort normal and breath sounds normal.  Abdominal: Soft. Bowel sounds are normal.  Musculoskeletal: Normal range of motion.  Neurological: He is alert and oriented to person, place, and time.  Skin: Skin is warm  and dry.  Psychiatric: He has a normal mood and affect. He is not actively hallucinating. He expresses suicidal ideation. He expresses no homicidal ideation. He expresses suicidal plans. He expresses no homicidal plans.  Nursing note and vitals reviewed.    ED Treatments / Results  Labs (all labs ordered are listed, but only abnormal results are displayed) Labs Reviewed  COMPREHENSIVE METABOLIC PANEL - Abnormal; Notable for the following components:      Result Value   Potassium 3.4 (*)    CO2 20 (*)    Glucose, Bld 115 (*)    BUN <5 (*)    Creatinine, Ser 0.58 (*)    AST 62 (*)    All other components within normal limits  ETHANOL - Abnormal; Notable for the following components:  Alcohol, Ethyl (B) 251 (*)    All other components within normal limits  ACETAMINOPHEN LEVEL - Abnormal; Notable for the following components:   Acetaminophen (Tylenol), Serum <10 (*)    All other components within normal limits  CBC - Abnormal; Notable for the following components:   MCV 102.2 (*)    All other components within normal limits  SALICYLATE LEVEL  RAPID URINE DRUG SCREEN, HOSP PERFORMED    EKG None  Radiology  Procedures Procedures (including critical care time)  Medications Ordered in ED Medications - No data to display   Initial Impression / Assessment and Plan / ED Course  I have reviewed the triage vital signs and the nursing notes.  Pertinent labs & imaging results that were available during my care of the patient were reviewed by me and considered in my medical decision making (see chart for details).  57 year old male here with suicidal ideation.  Reports he has had thoughts of shooting himself but does not have access to weapons.  He is intoxicated here but able to converse and follow commands when prompted.  His vitals are stable.  Labs reviewed, ethanol 251.  Patient has long-standing history of alcohol abuse, placed on CIWA protocol.  Medically cleared.  TTS  evaluation pending.  Final Clinical Impressions(s) / ED Diagnoses   Final diagnoses:  Suicidal ideation  Alcohol abuse    ED Discharge Orders    None       Garlon Hatchet, PA-C 10/24/18 1610    Zadie Rhine, MD 10/24/18 [phone]

## 2018-10-24 NOTE — Tx Team (Signed)
Initial Treatment Plan 10/24/2018 5:11 PM Warren Salas [id number]    PATIENT STRESSORS: Health problems Loss of drivers license Substance abuse   PATIENT STRENGTHS: Capable of independent living Communication skills Motivation for treatment/growth   PATIENT IDENTIFIED PROBLEMS: "drinking"  "DWI back-to-back"  "worrying so much"                 DISCHARGE CRITERIA:  Ability to meet basic life and health needs Withdrawal symptoms are absent or subacute and managed without 24-hour nursing intervention  PRELIMINARY DISCHARGE PLAN: Attend aftercare/continuing care group Return to previous living arrangement  PATIENT/FAMILY INVOLVEMENT: This treatment plan has been presented to and reviewed with the patient, Warren Salas, and/or family member.  The patient and family have been given the opportunity to ask questions and make suggestions.  Tania Ade, RN 10/24/2018, 5:11 PM

## 2018-10-24 NOTE — ED Notes (Signed)
Ate lunch

## 2018-10-24 NOTE — Progress Notes (Signed)
Pt accepted to [facility], Bed 305-2 Shuvon Rankin, NP, is the accepting provider.  Dr. Nehemiah Massed, MD, is the attending provider.  Call report to [phone]   [facility] Psych ED notified.   Pt is Voluntary.  Pt may be transported by [facility]  Pt scheduled  to arrive at [facility] @ 12:30-1:30 PM  Carney Bern T. Kaylyn Lim, MSW, LCSWA Disposition Clinical Social Work [phone] (cell) [phone] (office)

## 2018-10-24 NOTE — Progress Notes (Addendum)
D: Pt alert and oriented and ambulatory on admission and on the unit. Pt denies SI/HI, A/VH and his  affect was flat and mood depressed.  "56 y.o. M with hx of COPD, DM, HTN, CAD with prior MI x2 and stenting x5, presenting to the ED for SI.  Patient reports he has lost almost everyone important to him in his life-- 2 brothers, both parents.  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  He states to me "this is not a life, I would rather be dead."  He has had thoughts of shooting himself but does not have any weapons at home.  He has not made any attempts other than "drinking himself sick".  Last drink was earlier today, does report some cocaine use yesterday.  He denies any homicidal ideation, no hallucinations.  Patient does not have OP psychiatrist that he sees.  Not currently on any psychiatric medications."  A: Education, support, and encouragement provided, q15 minute safety checks initiated. Pt oriented to the unit.  R: Pt verbally contracts for safety, and remains safe on and off the unit. Pt will continued to be monitored.

## 2018-10-24 NOTE — Progress Notes (Signed)
Per Assunta Found, NP patient meets in patient criteria in 300 hall. TTS waiting on [facility] to confirm bed number.

## 2018-10-24 NOTE — ED Notes (Signed)
Patient valuables has been inventoried and placed in locker #5 and valuables taken to security-Monique,RN

## 2018-10-25 DIAGNOSIS — F1024 Alcohol dependence with alcohol-induced mood disorder: Secondary | ICD-10-CM

## 2018-10-25 DIAGNOSIS — F419 Anxiety disorder, unspecified: Secondary | ICD-10-CM

## 2018-10-25 MED ORDER — SERTRALINE HCL 25 MG PO TABS
25.0000 mg | ORAL_TABLET | Freq: Every day | ORAL | Status: DC
  Administered 2018-10-25 – 2018-10-27 (×3): 25 mg via ORAL
  Filled 2018-10-25 (×6): qty 1

## 2018-10-25 NOTE — BHH Suicide Risk Assessment (Signed)
[facility] Admission Suicide Risk Assessment   Nursing information obtained from:  Patient Demographic factors:  Male, Caucasian(Veteran) Current Mental Status:  Suicidal ideation indicated by patient Loss Factors:  Financial problems / change in socioeconomic status, Legal issues Historical Factors:  Impulsivity(SA) Risk Reduction Factors:  Positive coping skills or problem solving skills, Positive social support  Total Time spent with patient: 45 minutes Principal Problem:  Alcohol Dependence, Alcohol Induced Mood Disorder  Diagnosis:   Patient Active Problem List   Diagnosis Date Noted  . CAD (coronary artery disease) [I25.10] 12/12/2017  . Old MI (myocardial infarction) [I25.2] 12/12/2017  . Tobacco abuse [Z72.0] 12/12/2017  . Type 2 diabetes mellitus with complication, without long-term current use of insulin (HCC) [E11.8] 12/12/2017  . Acute MI, inferior wall (HCC) [I21.19] 11/01/2017  . Acute inferior myocardial infarction (HCC) [I21.19]    Subjective Data:   Continued Clinical Symptoms:  Alcohol Use Disorder Identification Test Final Score (AUDIT): 31 The "Alcohol Use Disorders Identification Test", Guidelines for Use in Primary Care, Second Edition.  World Science writer [facility]). Score between 0-7:  no or low risk or alcohol related problems. Score between 8-15:  moderate risk of alcohol related problems. Score between 16-19:  high risk of alcohol related problems. Score 20 or above:  warrants further diagnostic evaluation for alcohol dependence and treatment.   CLINICAL FACTORS:  57 year old male, currently homeless. History of alcohol dependence, reports heavy, daily drinking . Presented to the hospital voluntarily requesting detox and assistance with alcohol use disorder . Describes some depression and anxiety, improving in the context of abstinence . Endorses chronic medical illnesses ( CAD, history of MI)    Psychiatric Specialty Exam: Physical Exam  ROS  Blood  pressure (!) 152/94, pulse 88, temperature 97.8 F (36.6 C), temperature source Oral, resp. rate 16, height 5\' 6"  (1.676 m), weight 68 kg, SpO2 99 %.Body mass index is 24.2 kg/m.  See admit note MSE   COGNITIVE FEATURES THAT CONTRIBUTE TO RISK:  Closed-mindedness and Loss of executive function    SUICIDE RISK:   Moderate:  Frequent suicidal ideation with limited intensity, and duration, some specificity in terms of plans, no associated intent, good self-control, limited dysphoria/symptomatology, some risk factors present, and identifiable protective factors, including available and accessible social support.  PLAN OF CARE: Patient will be admitted to inpatient psychiatric unit for stabilization and safety. Will provide and encourage milieu participation. Provide medication management and maked adjustments as needed. Will also provide medication management to address potential alcohol WDL.  Will follow daily.    I certify that inpatient services furnished can reasonably be expected to improve the patient's condition.   Craige Cotta, MD 10/25/2018, 3:47 PM

## 2018-10-25 NOTE — Progress Notes (Signed)
Patient did attend the evening speaker NA meeting.  

## 2018-10-25 NOTE — Progress Notes (Signed)
Recreation Therapy Notes  Date: 10.30.19 Time: 0930 Location: [address] Dayroom  Group Topic: Stress Management  Goal Area(s) Addresses:  Patient will verbalize importance of using healthy stress management.  Patient will identify positive emotions associated with healthy stress management.   Intervention: Stress Management  Activity :  Guided Imagery.  LRT introduced the stress management technique of guided imagery.  LRT read a script that allowed patients to envision seeing the starry sky at night.  Patients were to follow along as the guided imagery was read to fully participate in the meditation.  Education:  Stress Management, Discharge Planning.   Education Outcome: Acknowledges edcuation/In group clarification offered/Needs additional education  Clinical Observations/Feedback: Pt did not attend group.     Yassine Brunsman, LRT/CTRS         Calem Cocozza A 10/25/2018 11:06 AM 

## 2018-10-25 NOTE — Plan of Care (Addendum)
Problem: Health Behavior/Discharge Planning: Goal: Compliance with treatment plan for underlying cause of condition will improve Outcome: Progressing   Problem: Safety: Goal: Periods of time without injury will increase Outcome: Progressing   Problem: Coping: Goal: Coping ability will improve Outcome: Progressing   Problem: Medication: Goal: Compliance with prescribed medication regimen will improve Outcome: Progressing DAR Note: Pt visible in milieu majority of this shift. Presents with flat affect and depressed mood on initial approach. Denies SI, HI, AVH and assessed "I'm doing alright so far" when assessed. Reports poor sleep last night "I just kept waking up and could not stay asleep but my appetite is good". Pt did not attend scheduled group this AM despite multiple prompts. Continues to take his medications as ordered. Denies adverse drug reactions.  Pt took her medications as ordered. Denies side effects.  Scheduled medications given per MD's orders with verbal education and effects monitored. Encouraged pt to voice concerns, attend to ADLs and comply with current treatment regimen including groups. Emotional support offered. Safety checks maintained without incident thus far.  Pt receptive to care. Tolerates all PO intake well. Pt went off unit for meals, returned without issues. POC continues for safety and mood stability.

## 2018-10-25 NOTE — Progress Notes (Signed)
D    Pt spent most of the shift in bed sleeping   He did not come for HS medications and did not socialize or interact with anyone but staff and his roommate in his room    A   Verbal support and encouragement offered   Medications offered    Q15 min checks R   Pt remains safe at this time
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
## 2018-10-25 NOTE — Tx Team (Signed)
Interdisciplinary Treatment and Diagnostic Plan Update  10/25/2018 Time of Session: 0830AM Warren Salas MRN: [id number]  Principal Diagnosis: MDD, recurrent, severe Secondary Diagnoses: Active Problems:   * No active hospital problems. *   Current Medications:  Current Facility-Administered Medications  Medication Dose Route Frequency Provider Last Rate Last Dose  . albuterol (PROVENTIL HFA;VENTOLIN HFA) 108 (90 Base) MCG/ACT inhaler 1-2 puff  1-2 puff Inhalation Q6H PRN Rankin, Shuvon B, NP      . aspirin EC tablet 81 mg  81 mg Oral Daily Rankin, Shuvon B, NP   81 mg at 10/25/18 0804  . atorvastatin (LIPITOR) tablet 80 mg  80 mg Oral q1800 Rankin, Shuvon B, NP   80 mg at 10/24/18 1731  . doxycycline (VIBRA-TABS) tablet 100 mg  100 mg Oral Q12H Rankin, Shuvon B, NP   100 mg at 10/25/18 0804  . hydrOXYzine (ATARAX/VISTARIL) tablet 25 mg  25 mg Oral Q6H PRN Rankin, Shuvon B, NP   25 mg at 10/24/18 1731  . Influenza vac split quadrivalent PF (FLUARIX) injection 0.5 mL  0.5 mL Intramuscular Tomorrow-1000 Cobos, Fernando A, MD      . isosorbide mononitrate (IMDUR) 24 hr tablet 30 mg  30 mg Oral Daily Rankin, Shuvon B, NP   30 mg at 10/25/18 0804  . lisinopril (PRINIVIL,ZESTRIL) tablet 5 mg  5 mg Oral Daily Rankin, Shuvon B, NP   5 mg at 10/25/18 0805  . loperamide (IMODIUM) capsule 2-4 mg  2-4 mg Oral PRN Rankin, Shuvon B, NP      . LORazepam (ATIVAN) tablet 1 mg  1 mg Oral Q6H PRN Rankin, Shuvon B, NP      . metFORMIN (GLUCOPHAGE) tablet 500 mg  500 mg Oral BID Rankin, Shuvon B, NP   500 mg at 10/25/18 0804  . metoprolol tartrate (LOPRESSOR) tablet 50 mg  50 mg Oral BID Rankin, Shuvon B, NP   50 mg at 10/25/18 0804  . multivitamin with minerals tablet 1 tablet  1 tablet Oral Daily Rankin, Shuvon B, NP   1 tablet at 10/25/18 0804  . nicotine (NICODERM CQ - dosed in mg/24 hours) patch 21 mg  21 mg Transdermal Daily Rankin, Shuvon B, NP   21 mg at 10/25/18 0803  . nitroGLYCERIN (NITROSTAT) SL  tablet 0.4 mg  0.4 mg Sublingual Q5 min PRN Rankin, Shuvon B, NP      . ondansetron (ZOFRAN-ODT) disintegrating tablet 4 mg  4 mg Oral Q6H PRN Rankin, Shuvon B, NP      . pantoprazole (PROTONIX) EC tablet 40 mg  40 mg Oral Daily Rankin, Shuvon B, NP   40 mg at 10/25/18 0804  . thiamine (B-1) injection 100 mg  100 mg Intramuscular Once Rankin, Shuvon B, NP      . thiamine (VITAMIN B-1) tablet 100 mg  100 mg Oral Daily Rankin, Shuvon B, NP   100 mg at 10/25/18 0804  . ticagrelor (BRILINTA) tablet 90 mg  90 mg Oral BID Rankin, Shuvon B, NP   90 mg at 10/25/18 0804   PTA Medications: Medications Prior to Admission  Medication Sig Dispense Refill Last Dose  . albuterol (PROVENTIL HFA;VENTOLIN HFA) 108 (90 Base) MCG/ACT inhaler Inhale 1-2 puffs into the lungs every 6 (six) hours as needed for wheezing. 1 Inhaler 0 Past Month at Unknown time  . aspirin EC 81 MG tablet Take 81 mg by mouth daily.   unk  . atorvastatin (LIPITOR) 80 MG tablet Take 1 tablet (80 mg total)  by mouth daily at 6 PM. 90 tablet 3 unk  . budesonide-formoterol (SYMBICORT) 160-4.5 MCG/ACT inhaler Inhale 2 puffs into the lungs 2 (two) times daily as needed (For shortness of breath.).    unk  . doxycycline (VIBRAMYCIN) 100 MG capsule Take 1 capsule (100 mg total) by mouth 2 (two) times daily. 20 capsule 0 has not started  . isosorbide mononitrate (IMDUR) 30 MG 24 hr tablet Take 1 tablet (30 mg total) by mouth daily. 90 tablet 3 unk  . lisinopril (PRINIVIL,ZESTRIL) 10 MG tablet Take 0.5 tablets (5 mg total) by mouth daily. 45 tablet 3 unk  . metFORMIN (GLUCOPHAGE) 500 MG tablet Take 500 mg by mouth 2 (two) times daily.   unk  . metoprolol tartrate (LOPRESSOR) 50 MG tablet Take 1 tablet (50 mg total) by mouth 2 (two) times daily. 180 tablet 3 unk  . nitroGLYCERIN (NITROSTAT) 0.4 MG SL tablet Place 1 tablet (0.4 mg total) under the tongue every 5 (five) minutes as needed. 25 tablet 2 unk at prn  . omeprazole (PRILOSEC) 20 MG capsule Take 1  capsule (20 mg total) by mouth daily. 14 capsule 0 unk  . predniSONE (DELTASONE) 20 MG tablet Take 40 mg by mouth daily for 3 days, then 20mg  by mouth daily for 3 days, then 10mg  daily for 3 days 12 tablet 0 unk  . ticagrelor (BRILINTA) 90 MG TABS tablet Take 1 tablet (90 mg total) by mouth 2 (two) times daily. 180 tablet 3 unk    Patient Stressors: Health problems Loss of drivers license Substance abuse  Patient Strengths: Capable of independent living Barrister's clerk for treatment/growth  Treatment Modalities: Medication Management, Group therapy, Case management,  1 to 1 session with clinician, Psychoeducation, Recreational therapy.   Physician Treatment Plan for Primary Diagnosis:MDD, recurrent, severe Long Term Goal(s): Improvement in symptoms so as ready for discharge Improvement in symptoms so as ready for discharge   Short Term Goals: Ability to identify changes in lifestyle to reduce recurrence of condition will improve Ability to disclose and discuss suicidal ideas Ability to demonstrate self-control will improve Ability to identify and develop effective coping behaviors will improve Ability to identify triggers associated with substance abuse/mental health issues will improve Ability to identify changes in lifestyle to reduce recurrence of condition will improve Ability to disclose and discuss suicidal ideas Ability to demonstrate self-control will improve Ability to identify and develop effective coping behaviors will improve Ability to identify triggers associated with substance abuse/mental health issues will improve  Medication Management: Evaluate patient's response, side effects, and tolerance of medication regimen.  Therapeutic Interventions: 1 to 1 sessions, Unit Group sessions and Medication administration.  Evaluation of Outcomes: Progressing  Physician Treatment Plan for Secondary Diagnosis: Active Problems:   * No active hospital problems.  *  Long Term Goal(s): Improvement in symptoms so as ready for discharge Improvement in symptoms so as ready for discharge   Short Term Goals: Ability to identify changes in lifestyle to reduce recurrence of condition will improve Ability to disclose and discuss suicidal ideas Ability to demonstrate self-control will improve Ability to identify and develop effective coping behaviors will improve Ability to identify triggers associated with substance abuse/mental health issues will improve Ability to identify changes in lifestyle to reduce recurrence of condition will improve Ability to disclose and discuss suicidal ideas Ability to demonstrate self-control will improve Ability to identify and develop effective coping behaviors will improve Ability to identify triggers associated with substance abuse/mental health issues will improve  Medication Management: Evaluate patient's response, side effects, and tolerance of medication regimen.  Therapeutic Interventions: 1 to 1 sessions, Unit Group sessions and Medication administration.  Evaluation of Outcomes: Progressing   RN Treatment Plan for Primary Diagnosis: MDD, recurrent, severe Long Term Goal(s): Knowledge of disease and therapeutic regimen to maintain health will improve  Short Term Goals: Ability to remain free from injury will improve, Ability to disclose and discuss suicidal ideas and Ability to identify and develop effective coping behaviors will improve  Medication Management: RN will administer medications as ordered by provider, will assess and evaluate patient's response and provide education to patient for prescribed medication. RN will report any adverse and/or side effects to prescribing provider.  Therapeutic Interventions: 1 on 1 counseling sessions, Psychoeducation, Medication administration, Evaluate responses to treatment, Monitor vital signs and CBGs as ordered, Perform/monitor CIWA, COWS, AIMS and Fall Risk  screenings as ordered, Perform wound care treatments as ordered.  Evaluation of Outcomes: Progressing   LCSW Treatment Plan for Primary Diagnosis: MDD, recurrent, severe Long Term Goal(s): Safe transition to appropriate next level of care at discharge, Engage patient in therapeutic group addressing interpersonal concerns.  Short Term Goals: Engage patient in aftercare planning with referrals and resources, Facilitate patient progression through stages of change regarding substance use diagnoses and concerns and Identify triggers associated with mental health/substance abuse issues  Therapeutic Interventions: Assess for all discharge needs, 1 to 1 time with Social worker, Explore available resources and support systems, Assess for adequacy in community support network, Educate family and significant other(s) on suicide prevention, Complete Psychosocial Assessment, Interpersonal group therapy.  Evaluation of Outcomes: Progressing   Progress in Treatment: Attending groups: Yes. Participating in groups: Yes. Taking medication as prescribed: Yes. Toleration medication: Yes. Family/Significant other contact made: SPE completed with pt; pt declined to consent to collateral contact.  Patient understands diagnosis: Yes. Discussing patient identified problems/goals with staff: Yes. Medical problems stabilized or resolved: Yes. Denies suicidal/homicidal ideation: Yes. Issues/concerns per patient self-inventory: No. Other: n/a   New problem(s) identified: No, Describe:  n/a  New Short Term/Long Term Goal(s): detox, medication management for mood stabilization; elimination of SI thoughts; development of comprehensive mental wellness/sobriety plan.   Patient Goals:  "I want to detox and get help with my suicidal thoughts. Right now I don't feel any hope."   Discharge Plan or Barriers: CSW assessing for appropriate referrals. Pt has follow-up appt at [facility]. Pt plans to return home and follow-up  with AA. [facility] pamphlet, Mobile Crisis information, and AA/NA information provided to patient for additional community support and resources.   Reason for Continuation of Hospitalization: Anxiety Depression Medication stabilization Suicidal ideation Withdrawal symptoms  Estimated Length of Stay: Monday, 10/30/18  Attendees: Patient: 10/25/2018 2:00 PM  Physician: Dr. Jama Flavors MD; Dr. Altamese [place] MD 10/25/2018 2:00 PM  Nursing: Lincoln Maxin RN 10/25/2018 2:00 PM  RN Care Manager:x 10/25/2018 2:00 PM  Social Worker: Corrie Mckusick LCSW 10/25/2018 2:00 PM  Recreational Therapist: x 10/25/2018 2:00 PM  Other: Feliz Beam Money NP 10/25/2018 2:00 PM  Other:  10/25/2018 2:00 PM  Other: 10/25/2018 2:00 PM    Scribe for Treatment Team: Rona Ravens, LCSW 10/25/2018 2:00 PM

## 2018-10-25 NOTE — H&P (Signed)
Psychiatric Admission Assessment Adult  Patient Identification: Warren Salas MRN:  [id number] Date of Evaluation:  10/25/2018 Chief Complaint:  " I got tired of drinking so much" Principal Diagnosis: Alcohol Dependence, Alcohol Induced Mood Disorder versus MDD, Alcohol Induced Anxiety Disorder versus Social Phobia Diagnosis:   Patient Active Problem List   Diagnosis Date Noted  . CAD (coronary artery disease) [I25.10] 12/12/2017  . Old MI (myocardial infarction) [I25.2] 12/12/2017  . Tobacco abuse [Z72.0] 12/12/2017  . Type 2 diabetes mellitus with complication, without long-term current use of insulin ([place]) [E11.8] 12/12/2017  . Acute MI, inferior wall ([place]) [I21.19] 11/01/2017  . Acute inferior myocardial infarction ([place]) [I21.19]    History of Present Illness:  57 year old man with history of alcohol use disorder, anxiety, depression, who presented to the hospital voluntarily. Reports heavy, daily drinking , up to 12-14 beers/day (and occasionally drinks liquor, when it is available). States " I just got tired of drinking so much, and I needed the help to stop". Describes daily drinking and endorses alcohol consumption starts in the mornings when he awakens. He reports he occasionally uses cocaine and has history of opiate dependence, states he has been sober x 2 years .  Admission BAL 251, admission UDS negative.  He has had recent visits to ED for medical issues , chest pain.  He reports anxiety and some depression, which he attributes in part to significant stressors and losses. He  has been homeless for about two weeks, because he spent his disability check on alcohol. He also reports significant losses, and points out that two of his brothers have passed away over the last two years . Patient endorses some depression, neuro-vegetative symptoms, and recent  passive SI with no plan or intention.  He endorses some neuro-vegetative symptoms as below.  Patient endorses diaphoresis and  tremors last night , but currently does not appear to be in any acute distress or discomfort and no tremors are noted . Associated Signs/Symptoms: Depression Symptoms:  depressed mood, anhedonia, insomnia, fatigue, feelings of worthlessness/guilt, hopelessness, suicidal thoughts without plan, (Hypo) Manic Symptoms:  none Anxiety Symptoms:  Reports some worrying and describes some social anxiety symptoms  Psychotic Symptoms:  Denies  PTSD Symptoms: Denies  Total Time spent with patient: 30 minutes  Past Psychiatric History:no psychiatric admissions, but reports he has been in rehab facilities in the remote past / last time was approximately 20 years ago. Reports history of depression, which he attributes in part to death of brothers and friends over the last couple of years . Denies history of mania/hypomania.  Denies history of suicide attempts, denies history of self cutting or of self injurious behaviors, denies history of psychosis. Denies history of violence. Endorses history of anxiety, and endorses some social anxiety symptoms.   Is the patient at risk to self? No.  Has the patient been a risk to self in the past 6 months? No.  Has the patient been a risk to self within the distant past? No.  Is the patient a risk to others? No.  Has the patient been a risk to others in the past 6 months? No.  Has the patient been a risk to others within the distant past? No.   Prior Inpatient Therapy:  as above Prior Outpatient Therapy:  no   Alcohol Screening: 1. How often do you have a drink containing alcohol?: 4 or more times a week 2. How many drinks containing alcohol do you have on a typical day when  you are drinking?: 10 or more 3. How often do you have six or more drinks on one occasion?: Daily or almost daily AUDIT-C Score: 12 4. How often during the last year have you found that you were not able to stop drinking once you had started?: Weekly 5. How often during the last year have  you failed to do what was normally expected from you becasue of drinking?: Monthly 6. How often during the last year have you needed a first drink in the morning to get yourself going after a heavy drinking session?: Daily or almost daily 7. How often during the last year have you had a feeling of guilt of remorse after drinking?: Daily or almost daily 8. How often during the last year have you been unable to remember what happened the night before because you had been drinking?: Monthly 9. Have you or someone else been injured as a result of your drinking?: No 10. Has a relative or friend or a doctor or another health worker been concerned about your drinking or suggested you cut down?: Yes, during the last year Alcohol Use Disorder Identification Test Final Score (AUDIT): 31 Intervention/Follow-up: Alcohol Education, Continued Monitoring Substance Abuse History in the last 12 months:  Reports history of alcohol dependence. States he drinks 12-18 beers per day. States he has been drinking " for many years", but has been drinking more heavily over recent months. Longest period of sobriety was one year.  Denies drug abuse  Consequences of Substance Abuse: Legal Consequences:  DUI, jail time Family Consequences:  divorce Blackouts:  occasional, drinks self to sleep every night Withdrawal Symptoms:   Diaphoresis Tremors Vomiting  Reports remote history of seizure , but it was several years ago.  Previous Psychotropic Medications: states he has never been on any psychiatric medications in the past  Psychological Evaluations: No  Past Medical History:  As below.  Past Medical History:  Diagnosis Date  . Back pain   . COPD (chronic obstructive pulmonary disease) ([place])   . Diabetes mellitus without complication ([place])   . Hypertension   . STEMI (ST elevation myocardial infarction) ([place]) 10/2017    Past Surgical History:  Procedure Laterality Date  . APPENDECTOMY    . CORONARY STENT  INTERVENTION N/A 12/29/2017   Procedure: CORONARY STENT INTERVENTION - LAD;  Surgeon: Jettie Booze, MD;  Location: [facility];  Service: Cardiovascular;  Laterality: N/A;  . CORONARY/GRAFT ACUTE MI REVASCULARIZATION N/A 11/01/2017   Procedure: Coronary/Graft Acute MI Revascularization;  Surgeon: Jettie Booze, MD;  Location: [facility];  Service: Cardiovascular;  Laterality: N/A;  . LEFT HEART CATH AND CORONARY ANGIOGRAPHY N/A 11/01/2017   Procedure: LEFT HEART CATH AND CORONARY ANGIOGRAPHY;  Surgeon: Jettie Booze, MD;  Location: [facility];  Service: Cardiovascular;  Laterality: N/A;   Family History: father died 57 years ago from an MI, mother has history of dementia. He has two surviving brothers , and reports two have passed away over the last several years ( from cancer, possible ALS) .  Family History  Problem Relation Age of Onset  . Hypertension Mother   . Diabetes Mother   . Hypertension Father   . Diabetes Father    Family Psychiatric  History: Grandfather, brother, uncle, and cousin all with hx of alcohol use disorder. Grandfather committed suicide with firearm. A cousin w/ hx of alcohol and drug use disorder also committed suicide.  Tobacco Screening: smokes 1.5 PPD  Social History:  89, single, currently homeless . He has three adult children. Denies legal issues .He has been on disability for 3 years.   Social History   Substance and Sexual Activity  Alcohol Use Yes     Social History   Substance and Sexual Activity  Drug Use Yes   Comment: heroin     Additional Social History:   Allergies:  No Known Allergies Lab Results:  Results for orders placed or performed during the hospital encounter of 10/23/18 (from the past 48 hour(s))  Comprehensive metabolic panel     Status: Abnormal   Collection Time: 10/24/18 12:10 AM  Result Value Ref Range   Sodium 138 135 - 145 mmol/L   Potassium 3.4 (L) 3.5 - 5.1 mmol/L   Chloride 105  98 - 111 mmol/L   CO2 20 (L) 22 - 32 mmol/L   Glucose, Bld 115 (H) 70 - 99 mg/dL   BUN <5 (L) 6 - 20 mg/dL   Creatinine, Ser 0.58 (L) 0.61 - 1.24 mg/dL   Calcium 9.2 8.9 - 10.3 mg/dL   Total Protein 7.2 6.5 - 8.1 g/dL   Albumin 3.9 3.5 - 5.0 g/dL   AST 62 (H) 15 - 41 U/L   ALT 34 0 - 44 U/L   Alkaline Phosphatase 78 38 - 126 U/L   Total Bilirubin 0.6 0.3 - 1.2 mg/dL   GFR calc non Af Amer >60 >60 mL/min   GFR calc Af Amer >60 >60 mL/min    Comment: (NOTE) The eGFR has been calculated using the CKD EPI equation. This calculation has not been validated in all clinical situations. eGFR's persistently <60 mL/min signify possible Chronic Kidney Disease.    Anion gap 13 5 - 15    Comment: Performed at [facility] [address] [place], [place] [address]  Ethanol     Status: Abnormal   Collection Time: 10/24/18 12:10 AM  Result Value Ref Range   Alcohol, Ethyl (B) 251 (H) <10 mg/dL    Comment: (NOTE) Lowest detectable limit for serum alcohol is 10 mg/dL. For medical purposes only. Performed at [facility], [place] [address] [place], [place] [address]   Salicylate level     Status: None   Collection Time: 10/24/18 12:10 AM  Result Value Ref Range   Salicylate Lvl <4.8 2.8 - 30.0 mg/dL    Comment: Performed at [facility] [address], [place], [place] [address]  Acetaminophen level     Status: Abnormal   Collection Time: 10/24/18 12:10 AM  Result Value Ref Range   Acetaminophen (Tylenol), Serum <10 (L) 10 - 30 ug/mL    Comment: (NOTE) Therapeutic concentrations vary significantly. A range of 10-30 ug/mL  may be an effective concentration for many patients. However, some  are best treated at concentrations outside of this range. Acetaminophen concentrations >150 ug/mL at 4 hours after ingestion  and >50 ug/mL at 12 hours after ingestion are often associated with  toxic reactions. Performed at [facility], [place] [address], [place],  [place] [address]   cbc     Status: Abnormal   Collection Time: 10/24/18 12:10 AM  Result Value Ref Range   WBC 9.7 4.0 - 10.5 K/uL   RBC 4.51 4.22 - 5.81 MIL/uL   Hemoglobin 15.3 13.0 - 17.0 g/dL   HCT 46.1 39.0 - 52.0 %   MCV 102.2 (H) 80.0 - 100.0 fL   MCH 33.9 26.0 - 34.0 pg  MCHC 33.2 30.0 - 36.0 g/dL   RDW 12.3 11.5 - 15.5 %   Platelets 161 150 - 400 K/uL   nRBC 0.0 0.0 - 0.2 %    Comment: Performed at [facility], [place] [address] [place], [place] [address]  Rapid urine drug screen (hospital performed)     Status: None   Collection Time: 10/24/18 12:10 AM  Result Value Ref Range   Opiates NONE DETECTED NONE DETECTED   Cocaine NONE DETECTED NONE DETECTED   Benzodiazepines NONE DETECTED NONE DETECTED   Amphetamines NONE DETECTED NONE DETECTED   Tetrahydrocannabinol NONE DETECTED NONE DETECTED   Barbiturates NONE DETECTED NONE DETECTED    Comment: (NOTE) DRUG SCREEN FOR MEDICAL PURPOSES ONLY.  IF CONFIRMATION IS NEEDED FOR ANY PURPOSE, NOTIFY LAB WITHIN 5 DAYS. LOWEST DETECTABLE LIMITS FOR URINE DRUG SCREEN Drug Class                     Cutoff (ng/mL) Amphetamine and metabolites    1000 Barbiturate and metabolites    200 Benzodiazepine                 497 Tricyclics and metabolites     300 Opiates and metabolites        300 Cocaine and metabolites        300 THC                            50 Performed at [facility], [place] [address] [place], [place] [address]     Blood Alcohol level:  Lab Results  Component Value Date   ETH 251 (H) 10/24/2018   ETH 198 (H) 85/88/5027    Metabolic Disorder Labs:  Lab Results  Component Value Date   HGBA1C 6.1 (H) 11/01/2017   MPG 128.37 11/01/2017   No results found for: PROLACTIN Lab Results  Component Value Date   CHOL 145 11/01/2017   TRIG 100 11/01/2017   HDL 77 11/01/2017   CHOLHDL 1.9 11/01/2017   VLDL 20 11/01/2017   LDLCALC 48 11/01/2017    Current Medications: Current Facility-Administered  Medications  Medication Dose Route Frequency Provider Last Rate Last Dose  . albuterol (PROVENTIL HFA;VENTOLIN HFA) 108 (90 Base) MCG/ACT inhaler 1-2 puff  1-2 puff Inhalation Q6H PRN Rankin, Shuvon B, NP      . aspirin EC tablet 81 mg  81 mg Oral Daily Rankin, Shuvon B, NP   81 mg at 10/25/18 0804  . atorvastatin (LIPITOR) tablet 80 mg  80 mg Oral q1800 Rankin, Shuvon B, NP   80 mg at 10/24/18 1731  . doxycycline (VIBRA-TABS) tablet 100 mg  100 mg Oral Q12H Rankin, Shuvon B, NP   100 mg at 10/25/18 0804  . hydrOXYzine (ATARAX/VISTARIL) tablet 25 mg  25 mg Oral Q6H PRN Rankin, Shuvon B, NP   25 mg at 10/24/18 1731  . Influenza vac split quadrivalent PF (FLUARIX) injection 0.5 mL  0.5 mL Intramuscular Tomorrow-1000 Cobos, Fernando A, MD      . isosorbide mononitrate (IMDUR) 24 hr tablet 30 mg  30 mg Oral Daily Rankin, Shuvon B, NP   30 mg at 10/25/18 0804  . lisinopril (PRINIVIL,ZESTRIL) tablet 5 mg  5 mg Oral Daily Rankin, Shuvon B, NP   5 mg at 10/25/18 0805  . loperamide (IMODIUM) capsule 2-4 mg  2-4 mg Oral PRN Rankin, Shuvon B, NP      .  LORazepam (ATIVAN) tablet 1 mg  1 mg Oral Q6H PRN Rankin, Shuvon B, NP      . metFORMIN (GLUCOPHAGE) tablet 500 mg  500 mg Oral BID Rankin, Shuvon B, NP   500 mg at 10/25/18 0804  . metoprolol tartrate (LOPRESSOR) tablet 50 mg  50 mg Oral BID Rankin, Shuvon B, NP   50 mg at 10/25/18 0804  . multivitamin with minerals tablet 1 tablet  1 tablet Oral Daily Rankin, Shuvon B, NP   1 tablet at 10/25/18 0804  . nicotine (NICODERM CQ - dosed in mg/24 hours) patch 21 mg  21 mg Transdermal Daily Rankin, Shuvon B, NP   21 mg at 10/25/18 0803  . nitroGLYCERIN (NITROSTAT) SL tablet 0.4 mg  0.4 mg Sublingual Q5 min PRN Rankin, Shuvon B, NP      . ondansetron (ZOFRAN-ODT) disintegrating tablet 4 mg  4 mg Oral Q6H PRN Rankin, Shuvon B, NP      . pantoprazole (PROTONIX) EC tablet 40 mg  40 mg Oral Daily Rankin, Shuvon B, NP   40 mg at 10/25/18 0804  . thiamine (B-1) injection  100 mg  100 mg Intramuscular Once Rankin, Shuvon B, NP      . thiamine (VITAMIN B-1) tablet 100 mg  100 mg Oral Daily Rankin, Shuvon B, NP   100 mg at 10/25/18 0804  . ticagrelor (BRILINTA) tablet 90 mg  90 mg Oral BID Rankin, Shuvon B, NP   90 mg at 10/25/18 0804   PTA Medications: Medications Prior to Admission  Medication Sig Dispense Refill Last Dose  . albuterol (PROVENTIL HFA;VENTOLIN HFA) 108 (90 Base) MCG/ACT inhaler Inhale 1-2 puffs into the lungs every 6 (six) hours as needed for wheezing. 1 Inhaler 0 Past Month at Unknown time  . aspirin EC 81 MG tablet Take 81 mg by mouth daily.   unk  . atorvastatin (LIPITOR) 80 MG tablet Take 1 tablet (80 mg total) by mouth daily at 6 PM. 90 tablet 3 unk  . budesonide-formoterol (SYMBICORT) 160-4.5 MCG/ACT inhaler Inhale 2 puffs into the lungs 2 (two) times daily as needed (For shortness of breath.).    unk  . doxycycline (VIBRAMYCIN) 100 MG capsule Take 1 capsule (100 mg total) by mouth 2 (two) times daily. 20 capsule 0 has not started  . isosorbide mononitrate (IMDUR) 30 MG 24 hr tablet Take 1 tablet (30 mg total) by mouth daily. 90 tablet 3 unk  . lisinopril (PRINIVIL,ZESTRIL) 10 MG tablet Take 0.5 tablets (5 mg total) by mouth daily. 45 tablet 3 unk  . metFORMIN (GLUCOPHAGE) 500 MG tablet Take 500 mg by mouth 2 (two) times daily.   unk  . metoprolol tartrate (LOPRESSOR) 50 MG tablet Take 1 tablet (50 mg total) by mouth 2 (two) times daily. 180 tablet 3 unk  . nitroGLYCERIN (NITROSTAT) 0.4 MG SL tablet Place 1 tablet (0.4 mg total) under the tongue every 5 (five) minutes as needed. 25 tablet 2 unk at prn  . omeprazole (PRILOSEC) 20 MG capsule Take 1 capsule (20 mg total) by mouth daily. 14 capsule 0 unk  . predniSONE (DELTASONE) 20 MG tablet Take 40 mg by mouth daily for 3 days, then 2m by mouth daily for 3 days, then 113mdaily for 3 days 12 tablet 0 unk  . ticagrelor (BRILINTA) 90 MG TABS tablet Take 1 tablet (90 mg total) by mouth 2 (two)  times daily. 180 tablet 3 unk    Musculoskeletal: Strength & Muscle Tone: within normal limits  Gait & Station: normal Patient leans: N/A  Psychiatric Specialty Exam: Physical Exam  Review of Systems  Constitutional: Negative.   HENT: Negative.   Eyes: Negative.   Respiratory: Negative.   Cardiovascular: Negative for chest pain.  Gastrointestinal: Negative for blood in stool, nausea and vomiting.  Genitourinary: Negative.  Negative for hematuria.  Musculoskeletal: Negative.   Skin: Negative.   Neurological: Negative for headaches.  Endo/Heme/Allergies: Negative.   Psychiatric/Behavioral: Positive for depression, substance abuse and suicidal ideas. The patient is nervous/anxious.     Blood pressure (!) 152/94, pulse 88, temperature 97.8 F (36.6 C), temperature source Oral, resp. rate 16, height _0  (1.676 m), weight 68 kg, SpO2 99 %.Body mass index is 24.2 kg/m.  General Appearance: Disheveled and Fairly Groomed  Eye Contact:  Good  Speech:  Clear and Coherent  Volume:  Normal  Mood:  reports mood is improved compared to how he felt prior to admission  Affect:  vaguely constricted, anxious, but reactive, smiles at times appropriately  Thought Process:  Linear and Descriptions of Associations: Intact  Orientation:  Full (Time, Place, and Person)  Thought Content:  no hallucinations, no delusions, not internally preoccupied   Suicidal Thoughts:  No denies current suicidal or self injurious ideations , denies homicidal or violent ideations  Homicidal Thoughts:  No  Memory:  recent and remote grossly intact   Judgement:  Fair  Insight:  Fair  Psychomotor Activity:  Normal- no tremors, no diaphoresis, no restlessness or psychomotor agitation  Concentration:  Concentration: Good and Attention Span: Good  Recall:  Good  Fund of Knowledge:  Good  Language:  Good  Akathisia:  No  Handed:  Right  AIMS (if indicated):     Assets:  Communication Skills Desire for Improvement   ADL's:  Intact  Cognition:  WNL  Sleep:  Number of Hours: 6.5    Treatment Plan Summary: Daily contact with patient to assess and evaluate symptoms and progress in treatment  Observation Level/Precautions:  15 minute checks  Laboratory:  as needed  Psychotherapy:  [facility], group therapy  Medications:  At this time no significant withdrawal symptoms- no tremors, no diaphoresis, no psychomotor agitation, no tachycardia, no visual disturbances. Will continue Ativan PRN for potential alcohol WDL as per CIWA scores/ protocol.  Thiamine and Folate/ MVI. Agrees to Zoloft trial for anxiety, depression, start 25 mgrs QDAY  On Brilinta for history of CAD/Stents. On Glucophage for history of DM, Lopressor , Lisinopril, Imdur for HTN    Consultations:  As needed   Discharge Concerns:  -   Estimated LOS: 5 days   Other:     Physician Treatment Plan for Primary Diagnosis:  Alcohol Dependence Long Term Goal(s): Improvement in symptoms so as ready for discharge  Short Term Goals: Ability to identify changes in lifestyle to reduce recurrence of condition will improve, Ability to disclose and discuss suicidal ideas, Ability to demonstrate self-control will improve, Ability to identify and develop effective coping behaviors will improve and Ability to identify triggers associated with substance abuse/mental health issues will improve  Physician Treatment Plan for Secondary Diagnosis: Alcohol Induced Mood Disorder versus MDD  Long Term Goal(s): Improvement in symptoms so as ready for discharge  Short Term Goals: Ability to identify changes in lifestyle to reduce recurrence of condition will improve, Ability to disclose and discuss suicidal ideas, Ability to demonstrate self-control will improve, Ability to identify and develop effective coping behaviors will improve and Ability to identify triggers associated with substance abuse/mental health  issues will improve  I certify that inpatient services  furnished can reasonably be expected to improve the patient's condition.    Verdis Frederickson, Medical Student 10/30/201910:26 AM

## 2018-10-25 NOTE — [facility] Suicide Risk Assessment (Signed)
[facility] INPATIENT:  Family/Significant Other Suicide Prevention Education  Suicide Prevention Education:  Patient Refusal for Family/Significant Other Suicide Prevention Education: The patient Warren Salas has refused to provide written consent for family/significant other to be provided Family/Significant Other Suicide Prevention Education during admission and/or prior to discharge.  Physician notified.  Marian Sorrow, MSW Intern 10/25/2018, 3:37 PM

## 2018-10-26 NOTE — Progress Notes (Signed)
Adult Psychoeducational Group Note  Date:  10/26/2018 Time:  7:08 PM  Group Topic/Focus:  Goals Group:   The focus of this group is to help patients establish daily goals to achieve during treatment and discuss how the patient can incorporate goal setting into their daily lives to aide in recovery.  Participation Level:  Active  Participation Quality:  Appropriate  Affect:  Appropriate  Cognitive:  Alert  Insight: Appropriate  Engagement in Group:  Engaged  Modes of Intervention:  Discussion  Additional Comments:  Pt attended group and participated in group discussion.  (combined afternoon MHT group in 8:30 am group)   Arpita Fentress R Quintan Saldivar 10/26/2018, 7:08 PM

## 2018-10-26 NOTE — Progress Notes (Signed)
Adult Psychoeducational Group Note  Date:  10/26/2018 Time:  9:53 PM  Group Topic/Focus:  Wrap-Up Group:   The focus of this group is to help patients review their daily goal of treatment and discuss progress on daily workbooks.  Participation Level:  Active  Participation Quality:  Appropriate  Affect:  Appropriate  Cognitive:  Alert and Oriented  Insight: Appropriate  Engagement in Group:  Developing/Improving  Modes of Intervention:  Clarification, Exploration and Support  Additional Comments:  Pt verbalized that he had a great day. Pt verbalized that he would like to work on getting out tomorrow.  Byanka Landrus, [name] Buba 10/26/2018, 9:53 PM

## 2018-10-26 NOTE — Progress Notes (Signed)
[facility] MD Progress Note  10/26/2018 5:43 PM Warren Salas  MRN:  [id number] Subjective: Patient reports she is feeling better than he did prior to admission.  States "I am doing all right today".  Currently denies any significant alcohol withdrawal symptoms.  Presents future oriented and is hopeful for discharge soon. Objective: I have discussed case with treatment team and have met with patient. 57 year old male with a history of alcohol dependence, anxiety, depression, presented to hospital requesting assistance with alcohol detoxification and efforts to work on sobriety/abstinence.  Reports he was drinking up to 14 beers a day.  Admission blood alcohol level was 251. He is currently endorsing improving mood and does present with a fuller range of affect.  Currently minimizes/denies significant depression.  Denies suicidal ideations and presents future oriented hoping for discharge soon. He is not currently presenting with significant alcohol withdrawal symptoms-no significant distal tremors, no diaphoresis, no psychomotor restlessness or agitation, denies significant headache, denies visual disturbances, no facial flushing, vitals are stable. Behavior on unit in good control, pleasant on approach. Currently denies medication side effects. In addition to alcohol, we also reviewed the likely benefit of smoking/nicotine cessation.  He is currently on nicoderm patch and denies cravings.  Principal Problem: Alcohol Use Disorder  Diagnosis:   Patient Active Problem List   Diagnosis Date Noted  . CAD (coronary artery disease) [I25.10] 12/12/2017  . Old MI (myocardial infarction) [I25.2] 12/12/2017  . Tobacco abuse [Z72.0] 12/12/2017  . Type 2 diabetes mellitus with complication, without long-term current use of insulin ([place]) [E11.8] 12/12/2017  . Acute MI, inferior wall ([place]) [I21.19] 11/01/2017  . Acute inferior myocardial infarction ([place]) [I21.19]    Total Time spent with patient: 20  minutes  Past Psychiatric History:   Past Medical History:  Past Medical History:  Diagnosis Date  . Back pain   . COPD (chronic obstructive pulmonary disease) ([place])   . Diabetes mellitus without complication ([place])   . Hypertension   . STEMI (ST elevation myocardial infarction) ([place]) 10/2017    Past Surgical History:  Procedure Laterality Date  . APPENDECTOMY    . CORONARY STENT INTERVENTION N/A 12/29/2017   Procedure: CORONARY STENT INTERVENTION - LAD;  Surgeon: Jettie Booze, MD;  Location: [facility];  Service: Cardiovascular;  Laterality: N/A;  . CORONARY/GRAFT ACUTE MI REVASCULARIZATION N/A 11/01/2017   Procedure: Coronary/Graft Acute MI Revascularization;  Surgeon: Jettie Booze, MD;  Location: [facility];  Service: Cardiovascular;  Laterality: N/A;  . LEFT HEART CATH AND CORONARY ANGIOGRAPHY N/A 11/01/2017   Procedure: LEFT HEART CATH AND CORONARY ANGIOGRAPHY;  Surgeon: Jettie Booze, MD;  Location: [facility];  Service: Cardiovascular;  Laterality: N/A;   Family History:  Family History  Problem Relation Age of Onset  . Hypertension Mother   . Diabetes Mother   . Hypertension Father   . Diabetes Father    Family Psychiatric  History:  Social History:  Social History   Substance and Sexual Activity  Alcohol Use Yes     Social History   Substance and Sexual Activity  Drug Use Yes   Comment: heroin     Social History   Socioeconomic History  . Marital status: Divorced    Spouse name: Not on file  . Number of children: Not on file  . Years of education: Not on file  . Highest education level: Not on file  Occupational History  . Not on file  Social Needs  . Financial  resource strain: Not on file  . Food insecurity:    Worry: Not on file    Inability: Not on file  . Transportation needs:    Medical: Not on file    Non-medical: Not on file  Tobacco Use  . Smoking status: Current Every Day Smoker    Packs/day:  1.00    Types: Cigarettes  . Smokeless tobacco: Never Used  Substance and Sexual Activity  . Alcohol use: Yes  . Drug use: Yes    Comment: heroin   . Sexual activity: Never  Lifestyle  . Physical activity:    Days per week: Not on file    Minutes per session: Not on file  . Stress: Not on file  Relationships  . Social connections:    Talks on phone: Not on file    Gets together: Not on file    Attends religious service: Not on file    Active member of club or organization: Not on file    Attends meetings of clubs or organizations: Not on file    Relationship status: Not on file  Other Topics Concern  . Not on file  Social History Narrative  . Not on file   Additional Social History:   Sleep: Fair  Appetite:  Good  Current Medications: Current Facility-Administered Medications  Medication Dose Route Frequency Provider Last Rate Last Dose  . albuterol (PROVENTIL HFA;VENTOLIN HFA) 108 (90 Base) MCG/ACT inhaler 1-2 puff  1-2 puff Inhalation Q6H PRN Rankin, Shuvon B, NP      . aspirin EC tablet 81 mg  81 mg Oral Daily Rankin, Shuvon B, NP   81 mg at 10/26/18 0803  . atorvastatin (LIPITOR) tablet 80 mg  80 mg Oral q1800 Rankin, Shuvon B, NP   80 mg at 10/26/18 1703  . hydrOXYzine (ATARAX/VISTARIL) tablet 25 mg  25 mg Oral Q6H PRN Rankin, Shuvon B, NP   25 mg at 10/24/18 1731  . Influenza vac split quadrivalent PF (FLUARIX) injection 0.5 mL  0.5 mL Intramuscular Tomorrow-1000 Mikinzie Maciejewski A, MD      . isosorbide mononitrate (IMDUR) 24 hr tablet 30 mg  30 mg Oral Daily Rankin, Shuvon B, NP   30 mg at 10/26/18 0802  . lisinopril (PRINIVIL,ZESTRIL) tablet 5 mg  5 mg Oral Daily Rankin, Shuvon B, NP   5 mg at 10/26/18 0752  . loperamide (IMODIUM) capsule 2-4 mg  2-4 mg Oral PRN Rankin, Shuvon B, NP      . LORazepam (ATIVAN) tablet 1 mg  1 mg Oral Q6H PRN Rankin, Shuvon B, NP      . metFORMIN (GLUCOPHAGE) tablet 500 mg  500 mg Oral BID Rankin, Shuvon B, NP   500 mg at 10/26/18 1700   . metoprolol tartrate (LOPRESSOR) tablet 50 mg  50 mg Oral BID Rankin, Shuvon B, NP   50 mg at 10/26/18 1700  . multivitamin with minerals tablet 1 tablet  1 tablet Oral Daily Rankin, Shuvon B, NP   1 tablet at 10/26/18 0752  . nicotine (NICODERM CQ - dosed in mg/24 hours) patch 21 mg  21 mg Transdermal Daily Rankin, Shuvon B, NP   21 mg at 10/26/18 0752  . nitroGLYCERIN (NITROSTAT) SL tablet 0.4 mg  0.4 mg Sublingual Q5 min PRN Rankin, Shuvon B, NP      . ondansetron (ZOFRAN-ODT) disintegrating tablet 4 mg  4 mg Oral Q6H PRN Rankin, Shuvon B, NP      . pantoprazole (PROTONIX) EC tablet 40  mg  40 mg Oral Daily Rankin, Shuvon B, NP   40 mg at 10/26/18 0803  . sertraline (ZOLOFT) tablet 25 mg  25 mg Oral Daily , Myer Peer, MD   25 mg at 10/26/18 0803  . thiamine (B-1) injection 100 mg  100 mg Intramuscular Once Rankin, Shuvon B, NP      . thiamine (VITAMIN B-1) tablet 100 mg  100 mg Oral Daily Rankin, Shuvon B, NP   100 mg at 10/26/18 0752  . ticagrelor (BRILINTA) tablet 90 mg  90 mg Oral BID Rankin, Shuvon B, NP   90 mg at 10/26/18 1700    Lab Results: No results found for this or any previous visit (from the past 48 hour(s)).  Blood Alcohol level:  Lab Results  Component Value Date   ETH 251 (H) 10/24/2018   ETH 198 (H) 74/94/4967    Metabolic Disorder Labs: Lab Results  Component Value Date   HGBA1C 6.1 (H) 11/01/2017   MPG 128.37 11/01/2017   No results found for: PROLACTIN Lab Results  Component Value Date   CHOL 145 11/01/2017   TRIG 100 11/01/2017   HDL 77 11/01/2017   CHOLHDL 1.9 11/01/2017   VLDL 20 11/01/2017   LDLCALC 48 11/01/2017    Physical Findings: AIMS:  , ,  ,  ,    CIWA:  CIWA-Ar Total: 1 COWS:     Musculoskeletal: Strength & Muscle Tone: within normal limits -no significant distal tremors, no diaphoresis, no restlessness or acute distress Gait & Station: normal Patient leans: N/A  Psychiatric Specialty Exam: Physical Exam  ROS denies  headache, denies visual disturbances, no chest pain, no shortness of breath, no vomiting  Blood pressure (!) 146/78, pulse 79, temperature 98.7 F (37.1 C), resp. rate 16, height 5' 6" (1.676 m), weight 68 kg, SpO2 99 %.Body mass index is 24.2 kg/m.  General Appearance: Improving grooming  Eye Contact:  Good  Speech:  Normal Rate  Volume:  Normal  Mood:  Improving mood, describes mood as "okay" today  Affect:  Reactive, appropriate  Thought Process:  Linear and Descriptions of Associations: Intact  Orientation:  Other:  Fully alert and attentive  Thought Content:  No hallucinations, no delusions  Suicidal Thoughts:  No-denies suicidal or self-injurious ideations, denies homicidal or violent ideations, contracts for safety on unit  Homicidal Thoughts:  No  Memory:  Recent and remote grossly intact  Judgement:  Other:  Improving  Insight:  Improving  Psychomotor Activity:  Normal  Concentration:  Concentration: Good and Attention Span: Good  Recall:  Good  Fund of Knowledge:  Good  Language:  Good  Akathisia:  Negative  Handed:  Right  AIMS (if indicated):     Assets:  Desire for Improvement Resilience  ADL's:  Intact  Cognition:  WNL  Sleep:  Number of Hours: 3.75   Assessment -  57 year old male, currently homeless. History of alcohol dependence, reports heavy, daily drinking . Presented to the hospital voluntarily requesting detox and assistance with alcohol use disorder . Describes some depression and anxiety, improving in the context of abstinence . Endorses chronic medical illnesses ( CAD, history of MI)   Today patient reports improving mood, affect is reactive, pleasant on approach.  Currently minimizes depression or significant neurovegetative symptoms.  Affect is reactive.  He is currently not presenting with significant alcohol withdrawal symptoms and presents calm, in no acute distress or discomfort.  As he improves he is becoming more future oriented and  today expresses  hope to be discharged soon.  Thus far tolerating Zoloft well. Treatment Plan Summary: Daily contact with patient to assess and evaluate symptoms and progress in treatment, Medication management, Plan Inpatient treatment and Medications as below Encourage group and milieu participation to work on coping skills and symptom reduction Encourage efforts to work on sobriety and relapse prevention Continue Zoloft 25 mg daily for depression Continue Ativan 1 mg every 6 hours as needed for alcohol withdrawal, as per CIWA scores Continue Aspirin, Brilinta, Lipitor, n checkpain Nitroglycerin as needed for chest pain for history of coronary artery disease/stents.  Of note I have reviewed with patient the potentially increased risk of bleeding, particularly GI bleeding regarding being on anticoagulants and antidepressant medication. Continue lisinopril, isosorbide, metoprolol for hypertension Treatment team working on disposition planning Jenne Campus, MD 10/26/2018, 5:43 PM

## 2018-10-26 NOTE — Progress Notes (Signed)
D: Pt was in the hallway upon initial approach.  Pt presents with anxious, depressed affect and mood.  He reports his day has "been great" and his goal is "getting my life together."  He reports he attended groups today.  Pt denies SI/HI, denies hallucinations, denies pain, denies withdrawal symptoms.  Pt has been visible in milieu interacting with peers and staff appropriately.  Pt attended evening group.  He has been ambulating slowly without use of walker tonight.     A: Introduced self to pt.  Met with pt 1:1.  Actively listened to pt and offered support and encouragement.  PRN medication administered for anxiety.  Fall prevention techniques reviewed with pt and he verbalized understanding.  Q15 minute safety checks maintained.  R: Pt is safe on the unit.  Pt is compliant with medication.  Pt verbally contracts for safety and reports he will inform staff of needs and concerns.  Will continue to monitor and assess.

## 2018-10-27 DIAGNOSIS — F1024 Alcohol dependence with alcohol-induced mood disorder: Secondary | ICD-10-CM

## 2018-10-27 DIAGNOSIS — F332 Major depressive disorder, recurrent severe without psychotic features: Secondary | ICD-10-CM | POA: Diagnosis present

## 2018-10-27 MED ORDER — SERTRALINE HCL 25 MG PO TABS: 25.0000 mg | ORAL_TABLET | Freq: Every day | ORAL | 0 refills | Status: DC

## 2018-10-27 MED ORDER — METFORMIN HCL 500 MG PO TABS: 500.0000 mg | ORAL_TABLET | Freq: Two times a day (BID) | ORAL | 0 refills | Status: DC

## 2018-10-27 MED ORDER — HYDROXYZINE HCL 25 MG PO TABS: 25.0000 mg | ORAL_TABLET | Freq: Four times a day (QID) | ORAL | 0 refills | Status: DC | PRN

## 2018-10-27 NOTE — Progress Notes (Signed)
Pt completed his daily assessment and on this he wrote  He denied SI today and he rated his depression, hopelessness and anxiety " 0/0/2", respectively. He is given his dc instrucitons by this Clinical research associate, he stated verbal understanding and willingness to comply. HE is given cc of dc instructions ( SRA, AVS, SSP and transition record) and all belongings in his locker are returned to him. He is escorted to bldg entrance per poc.

## 2018-10-27 NOTE — Discharge Summary (Addendum)
Physician Discharge Summary Note  Patient:  Warren Salas is an 57 y.o., male MRN:  [id number] DOB:  [birth year] Patient phone:  [phone] (home)  Patient address:   [address] [place] [address],  Total Time spent with patient: 20 minutes  Date of Admission:  10/24/2018 Date of Discharge: 10/27/18  Reason for Admission:  Worsening depression with SI  Principal Problem: MDD (major depressive disorder), recurrent severe, without psychosis [facility]) Discharge Diagnoses: Patient Active Problem List   Diagnosis Date Noted  . MDD (major depressive disorder), recurrent severe, without psychosis (HCC) [F33.2] 10/27/2018  . Alcohol dependence with alcohol-induced mood disorder (HCC) [F10.24]   . CAD (coronary artery disease) [I25.10] 12/12/2017  . Old MI (myocardial infarction) [I25.2] 12/12/2017  . Tobacco abuse [Z72.0] 12/12/2017  . Type 2 diabetes mellitus with complication, without long-term current use of insulin (HCC) [E11.8] 12/12/2017  . Acute MI, inferior wall (HCC) [I21.19] 11/01/2017  . Acute inferior myocardial infarction (HCC) [I21.19]     Past Psychiatric History: no psychiatric admissions, but reports he has been in rehab facilities in the remote past / last time was approximately 20 years ago. Reports history of depression, which he attributes in part to death of brothers and friends over the last couple of years . Denies history of mania/hypomania.  Denies history of suicide attempts, denies history of self cutting or of self injurious behaviors, denies history of psychosis. Denies history of violence. Endorses history of anxiety, and endorses some social anxiety symptoms.  Past Medical History:  Past Medical History:  Diagnosis Date  . Back pain   . COPD (chronic obstructive pulmonary disease) (HCC)   . Diabetes mellitus without complication (HCC)   . Hypertension   . STEMI (ST elevation myocardial infarction) (HCC) 10/2017    Past Surgical History:   Procedure Laterality Date  . APPENDECTOMY    . CORONARY STENT INTERVENTION N/A 12/29/2017   Procedure: CORONARY STENT INTERVENTION - LAD;  Surgeon: Corky Crafts, MD;  Location: [facility] INVASIVE CV LAB;  Service: Cardiovascular;  Laterality: N/A;  . CORONARY/GRAFT ACUTE MI REVASCULARIZATION N/A 11/01/2017   Procedure: Coronary/Graft Acute MI Revascularization;  Surgeon: Corky Crafts, MD;  Location: [facility] INVASIVE CV LAB;  Service: Cardiovascular;  Laterality: N/A;  . LEFT HEART CATH AND CORONARY ANGIOGRAPHY N/A 11/01/2017   Procedure: LEFT HEART CATH AND CORONARY ANGIOGRAPHY;  Surgeon: Corky Crafts, MD;  Location: [facility] INVASIVE CV LAB;  Service: Cardiovascular;  Laterality: N/A;   Family History:  Family History  Problem Relation Age of Onset  . Hypertension Mother   . Diabetes Mother   . Hypertension Father   . Diabetes Father    Family Psychiatric  History: Grandfather, brother, uncle, and cousin all with hx of alcohol use disorder. Grandfather committed suicide with firearm. A cousin w/ hx of alcohol and drug use disorder also committed suicide Social History:  Social History   Substance and Sexual Activity  Alcohol Use Yes     Social History   Substance and Sexual Activity  Drug Use Yes   Comment: heroin     Social History   Socioeconomic History  . Marital status: Divorced    Spouse name: Not on file  . Number of children: Not on file  . Years of education: Not on file  . Highest education level: Not on file  Occupational History  . Not on file  Social Needs  . Financial resource strain: Not on file  . Food insecurity:    Worry: Not  on file    Inability: Not on file  . Transportation needs:    Medical: Not on file    Non-medical: Not on file  Tobacco Use  . Smoking status: Current Every Day Smoker    Packs/day: 1.00    Types: Cigarettes  . Smokeless tobacco: Never Used  Substance and Sexual Activity  . Alcohol use: Yes  . Drug use: Yes     Comment: heroin   . Sexual activity: Never  Lifestyle  . Physical activity:    Days per week: Not on file    Minutes per session: Not on file  . Stress: Not on file  Relationships  . Social connections:    Talks on phone: Not on file    Gets together: Not on file    Attends religious service: Not on file    Active member of club or organization: Not on file    Attends meetings of clubs or organizations: Not on file    Relationship status: Not on file  Other Topics Concern  . Not on file  Social History Narrative  . Not on file    Hospital Course:   10/25/18 [facility] MD Assessment: 57 year old man with history of alcohol use disorder, anxiety, depression, who presented to the hospital voluntarily. Reports heavy, daily drinking , up to 12-14 beers/day (and occasionally drinks liquor, when it is available). States " I just got tired of drinking so much, and I needed the help to stop". Describes daily drinking and endorses alcohol consumption starts in the mornings when he awakens. He reports he occasionally uses cocaine and has history of opiate dependence, states he has been sober x 2 years .  Admission BAL 251, admission UDS negative.  He has had recent visits to ED for medical issues , chest pain.  He reports anxiety and some depression, which he attributes in part to significant stressors and losses. He  has been homeless for about two weeks, because he spent his disability check on alcohol. He also reports significant losses, and points out that two of his brothers have passed away over the last two years . Patient endorses some depression, neuro-vegetative symptoms, and recent  passive SI with no plan or intention.  He endorses some neuro-vegetative symptoms as below.  Patient endorses diaphoresis and tremors last night , but currently does not appear to be in any acute distress or discomfort and no tremors are noted .  Patient remained on the [facility] unit for 2 days. The patient stabilized on  medication and therapy. Patient was discharged on Zoloft 25 mg Daily, . Patient has shown improvement with improved mood, affect, sleep, appetite, and interaction. Patient has attended group and participated. Patient has been seen in the day room interacting with peers and staff appropriately. Patient denies any SI/HI/AVH and contracts for safety. Patient agrees to follow up at [facility]. Patient is provided with prescriptions for their medications upon discharge.   Physical Findings: AIMS:  , ,  ,  ,    CIWA:  CIWA-Ar Total: 0 COWS:     Musculoskeletal: Strength & Muscle Tone: within normal limits Gait & Station: normal Patient leans: N/A  Psychiatric Specialty Exam: Physical Exam  Nursing note and vitals reviewed. Constitutional: He is oriented to person, place, and time. He appears well-developed and well-nourished.  Cardiovascular: Normal rate.  Respiratory: Effort normal.  Musculoskeletal: Normal range of motion.  Neurological: He is alert and oriented to person, place, and time.  Skin: Skin is  warm.    Review of Systems  Constitutional: Negative.   HENT: Negative.   Eyes: Negative.   Respiratory: Negative.   Cardiovascular: Negative.   Gastrointestinal: Negative.   Genitourinary: Negative.   Musculoskeletal: Negative.   Skin: Negative.   Neurological: Negative.   Endo/Heme/Allergies: Negative.   Psychiatric/Behavioral: Negative.     Blood pressure 137/76, pulse 79, temperature 98 F (36.7 C), temperature source Oral, resp. rate 20, height 5\' 6"  (1.676 m), weight 68 kg, SpO2 99 %.Body mass index is 24.2 kg/m.  General Appearance: Casual  Eye Contact:  Good  Speech:  Clear and Coherent and Normal Rate  Volume:  Normal  Mood:  Euthymic  Affect:  Congruent  Thought Process:  Goal Directed and Descriptions of Associations: Intact  Orientation:  Full (Time, Place, and Person)  Thought Content:  WDL  Suicidal Thoughts:  No  Homicidal Thoughts:  No  Memory:  Immediate;    Good Recent;   Good Remote;   Good  Judgement:  Good  Insight:  Fair  Psychomotor Activity:  Normal  Concentration:  Concentration: Good and Attention Span: Good  Recall:  Good  Fund of Knowledge:  Good  Language:  Good  Akathisia:  No  Handed:  Right  AIMS (if indicated):     Assets:  Communication Skills Desire for Improvement Financial Resources/Insurance Housing Physical Health Social Support Transportation  ADL's:  Intact  Cognition:  WNL  Sleep:  Number of Hours: 5.25     Have you used any form of tobacco in the last 30 days? (Cigarettes, Smokeless Tobacco, Cigars, and/or Pipes): Yes  Has this patient used any form of tobacco in the last 30 days? (Cigarettes, Smokeless Tobacco, Cigars, and/or Pipes) Yes, Yes, A prescription for an FDA-approved tobacco cessation medication was offered at discharge and the patient refused  Blood Alcohol level:  Lab Results  Component Value Date   ETH 251 (H) 10/24/2018   ETH 198 (H) 10/22/2018    Metabolic Disorder Labs:  Lab Results  Component Value Date   HGBA1C 6.1 (H) 11/01/2017   MPG 128.37 11/01/2017   No results found for: PROLACTIN Lab Results  Component Value Date   CHOL 145 11/01/2017   TRIG 100 11/01/2017   HDL 77 11/01/2017   CHOLHDL 1.9 11/01/2017   VLDL 20 11/01/2017   LDLCALC 48 11/01/2017    See Psychiatric Specialty Exam and Suicide Risk Assessment completed by Attending Physician prior to discharge.  Discharge destination:  Home  Is patient on multiple antipsychotic therapies at discharge:  No   Has Patient had three or more failed trials of antipsychotic monotherapy by history:  No  Recommended Plan for Multiple Antipsychotic Therapies: NA   Allergies as of 10/27/2018   No Known Allergies     Medication List    STOP taking these medications   doxycycline 100 MG capsule Commonly known as:  VIBRAMYCIN   predniSONE 20 MG tablet Commonly known as:  DELTASONE     TAKE these medications      Indication  albuterol 108 (90 Base) MCG/ACT inhaler Commonly known as:  PROVENTIL HFA;VENTOLIN HFA Inhale 1-2 puffs into the lungs every 6 (six) hours as needed for wheezing.  Indication:  Asthma   aspirin EC 81 MG tablet Take 81 mg by mouth daily.  Indication:  Per PCP   atorvastatin 80 MG tablet Commonly known as:  LIPITOR Take 1 tablet (80 mg total) by mouth daily at 6 PM.  Indication:  High Amount of  Fats in the Blood   hydrOXYzine 25 MG tablet Commonly known as:  ATARAX/VISTARIL Take 1 tablet (25 mg total) by mouth every 6 (six) hours as needed for anxiety.  Indication:  Feeling Anxious   isosorbide mononitrate 30 MG 24 hr tablet Commonly known as:  IMDUR Take 1 tablet (30 mg total) by mouth daily.  Indication:  Per PCP   lisinopril 10 MG tablet Commonly known as:  PRINIVIL,ZESTRIL Take 0.5 tablets (5 mg total) by mouth daily.  Indication:  High Blood Pressure Disorder   metFORMIN 500 MG tablet Commonly known as:  GLUCOPHAGE Take 1 tablet (500 mg total) by mouth 2 (two) times daily.  Indication:  Type 2 Diabetes   metoprolol tartrate 50 MG tablet Commonly known as:  LOPRESSOR Take 1 tablet (50 mg total) by mouth 2 (two) times daily.  Indication:  High Blood Pressure Disorder   nitroGLYCERIN 0.4 MG SL tablet Commonly known as:  NITROSTAT Place 1 tablet (0.4 mg total) under the tongue every 5 (five) minutes as needed.  Indication:  Per PCP   omeprazole 20 MG capsule Commonly known as:  PRILOSEC Take 1 capsule (20 mg total) by mouth daily.  Indication:  Gastroesophageal Reflux Disease   sertraline 25 MG tablet Commonly known as:  ZOLOFT Take 1 tablet (25 mg total) by mouth daily. For mood control Start taking on:  10/28/2018  Indication:  mood stability   SYMBICORT 160-4.5 MCG/ACT inhaler Generic drug:  budesonide-formoterol Inhale 2 puffs into the lungs 2 (two) times daily as needed (For shortness of breath.).  Indication:  Asthma   ticagrelor 90 MG  Tabs tablet Commonly known as:  BRILINTA Take 1 tablet (90 mg total) by mouth 2 (two) times daily.  Indication:  Acute Coronary Syndrome      Follow-up Information    [facility]. Go on 11/03/2018.   Specialty:  Behavioral Health Why:  Hospital follow-up appointment is on Friday, 11/03/18 at 8:00AM. Please bring your Photo ID, social security card, insurance card, and any proof of household income if you have it. Thank you.  [address] [place] [address] [phone]           Follow-up recommendations:  Continue activity as tolerated. Continue diet as recommended by your PCP. Ensure to keep all appointments with outpatient providers.  Comments:  Patient is instructed prior to discharge to: Take all medications as prescribed by his/her mental healthcare provider. Report any adverse effects and or reactions from the medicines to his/her outpatient provider promptly. Patient has been instructed & cautioned: To not engage in alcohol and or illegal drug use while on prescription medicines. In the event of worsening symptoms, patient is instructed to call the crisis hotline, 911 and or go to the nearest ED for appropriate evaluation and treatment of symptoms. To follow-up with his/her primary care provider for your other medical issues, concerns and or health care needs.    Signed: Gerlene Burdock Money, FNP 10/27/2018, 4:07 PM   Patient seen, Suicide Assessment Completed.  Disposition Plan Reviewed

## 2018-10-27 NOTE — BHH Suicide Risk Assessment (Addendum)
[facility] Discharge Suicide Risk Assessment   Principal Problem:  Alcohol Dependence  Discharge Diagnoses: Same Patient Active Problem List   Diagnosis Date Noted  . MDD (major depressive disorder), recurrent severe, without psychosis (HCC) [F33.2] 10/27/2018  . CAD (coronary artery disease) [I25.10] 12/12/2017  . Old MI (myocardial infarction) [I25.2] 12/12/2017  . Tobacco abuse [Z72.0] 12/12/2017  . Type 2 diabetes mellitus with complication, without long-term current use of insulin (HCC) [E11.8] 12/12/2017  . Acute MI, inferior wall (HCC) [I21.19] 11/01/2017  . Acute inferior myocardial infarction (HCC) [I21.19]     Total Time spent with patient: 30 minutes  Musculoskeletal: Strength & Muscle Tone: within normal limits- no current psychomotor agitation or restlessness, no tremors, no diaphoresis Gait & Station: normal Patient leans: N/A  Psychiatric Specialty Exam: ROS denies headache, denies chest pain, no shortness of breath, no vomiting , no fever or chills , denies any bleeding   Blood pressure (!) 162/86, pulse 88, temperature 98 F (36.7 C), temperature source Oral, resp. rate 20, height 5\' 6"  (1.676 m), weight 68 kg, SpO2 99 %.Body mass index is 24.2 kg/m.  General Appearance: improving grooming   Eye Contact::  Good  Speech:  Normal Rate409  Volume:  Normal  Mood:  reports improved mood , denies feeling depressed at this time   Affect:  Appropriate and more reactive  Thought Process:  Linear and Descriptions of Associations: Intact  Orientation:  Other:  fully alert and attentive   Thought Content:  no hallucinations, no delusions, not internally preoccupied  Suicidal Thoughts:  No today denies any suicidal or self injurious ideations, denies homicidal or violent ideations  Homicidal Thoughts:  No  Memory:  recent and remote grossly intact   Judgement:  Other:  improving   Insight:  improving   Psychomotor Activity:  Normal  Concentration:  Good  Recall:  Good  Fund  of Knowledge:Good  Language: Good  Akathisia:  Negative  Handed:  Right  AIMS (if indicated):     Assets:  Desire for Improvement Resilience  Sleep:  Number of Hours: 5.25  Cognition: WNL  ADL's:  Intact   Mental Status Per Nursing Assessment::   On Admission:  Suicidal ideation indicated by patient  Demographic Factors:  56, single, homeless, has three adult children  Loss Factors: Homelessness, alcohol use disorder   Historical Factors: Alcohol use disorder, opiate use disorder , in remission. Depression. No prior psychiatric admissions, no history of suicide attempts   Risk Reduction Factors:   Positive coping skills or problem solving skills  Continued Clinical Symptoms:  At this time patient is alert, attentive, improved grooming , improved eye contact, mood is described as improved and currently presents euthymic, with an appropriate and full range of affect . No thought disorder, no suicidal or self injurious ideations, no homicidal or violent ideations, no psychotic symptoms, future oriented .  He is currently not presenting with significant alcohol withdrawal symptoms- no tremors, no diaphoresis, no psychomotor agitation or restlessness, no headache, no facial flushing, no visual disturbances, no tachycardia.  No disruptive or agitated behaviors on unit, pleasant on approach. Denies medication side effects- we have reviewed medication side effects, including potential increased risk of bleeding/GI bleeding as on antidepressant and on anticoagulant. Denies any current bleeding or any melenas .   Cognitive Features That Contribute To Risk:  No gross cognitive deficits noted upon discharge. Is alert , attentive, and oriented x 3   Suicide Risk:  Mild:  Suicidal ideation of limited frequency,  intensity, duration, and specificity.  There are no identifiable plans, no associated intent, mild dysphoria and related symptoms, good self-control (both objective and subjective  assessment), few other risk factors, and identifiable protective factors, including available and accessible social support.  Follow-up Information    [facility]. Go on 11/03/2018.   Specialty:  Behavioral Health Why:  Hospital follow-up appointment is on Friday, 11/03/18 at 8:00AM. Please bring your Photo ID, social security card, insurance card, and any proof of household income if you have it. Thank you.  Contact information: [address] [place] [address] [phone]           Plan Of Care/Follow-up recommendations:  Activity:  as tolerated  Diet:  heart healthy, diabetic diet Tests:  NA Other:  See below  Patient is expressing readiness for discharge.  No current grounds for involuntary commitment. States he plans to rent a room . Follow up as above Vesta Mixer). AA attendance encouraged . Continue to follow up with his outpatient Cardiologist ( Dr. Eldridge Dace)  for ongoing monitoring and management of chronic cardiac illness .   Craige Cotta, MD 10/27/2018, 10:50 AM

## 2018-10-27 NOTE — Progress Notes (Signed)
Recreation Therapy Notes  Date: 11.1.19 Time: 0930 Location: [address] Dayroom  Group Topic: Stress Management  Goal Area(s) Addresses:  Patient will verbalize importance of using healthy stress management.  Patient will identify positive emotions associated with healthy stress management.   Behavioral Response: Engaged  Intervention: Stress Management  Activity :  Guided Imagery.  LRT introduced the stress management technique of guided imagery.  LRT read a script allowing patients to visualize being in a peaceful meadow.  Patients were to listen as script was read to engage in the meditation.  Education:  Stress Management, Discharge Planning.   Education Outcome: Acknowledges edcuation/In group clarification offered/Needs additional education  Clinical Observations/Feedback: Pt attended and participated in group.     Caroll Rancher, LRT/CTRS         Caroll Rancher A 10/27/2018 11:32 AM

## 2018-10-27 NOTE — Progress Notes (Signed)
  [facility] Adult Case Management Discharge Plan :  Will you be returning to the same living situation after discharge:  Yes,  patient reports he is going to an apartment at discharge. At discharge, do you have transportation home?: Yes,  patient reports he is taking a taxi at discharge Do you have the ability to pay for your medications: Yes,  Medicare, Medicaid and SSDI  Release of information consent forms completed and in the chart;  Patient's signature needed at discharge.  Patient to Follow up at: Follow-up Information    [facility] Follow up on 11/03/2018.   Specialty:  Behavioral Health Why:  Hospital follow-up on Friday 11/03/18 at 8:00AM. Please bring: photo ID, social security card, insurance card, and any proof of household income if you have it. Thank you.  Contact information: [address] [place] [address] [phone]           Next level of care provider has access to [facility] Link:yes  Safety Planning and Suicide Prevention discussed: Yes,  with the patient  Have you used any form of tobacco in the last 30 days? (Cigarettes, Smokeless Tobacco, Cigars, and/or Pipes): Yes  Has patient been referred to the Quitline?: Patient refused referral  Patient has been referred for addiction treatment: Pt. refused referral  Maeola Sarah, LCSWA 10/27/2018, 10:04 AM

## 2018-10-27 NOTE — Progress Notes (Signed)
DAR Note: Pt visible in milieu at long intervals during shift. A & O to self, place and events. Presents with flat affect and anxious mood, brightens up when engaged. Denies SI, HI, AVH and pain when assessed. Per pt "this is the best my mood has been in a while, I feel great and the doctor said he will increase my depression medicine". Pt went off unit for activities and meals with peers, returned without issues. Pt took his medications when offered. Denies adverse drug reactions when assessed. Emotional support offered. Encouraged pt to voice concerns, attend to ADLS and comply with current treatment regimen including groups. Scheduled medications given per MD's order and effects monitored. Safety checks continues at Q 15 minutes intervals without self harm gestures or outburst. Pt receptive to care, cooperative with unit routines. Tolerates all PO intake well. Denies concerns at this time. Remains safe on and off unit.

## 2018-11-16 ENCOUNTER — Other Ambulatory Visit: Payer: Self-pay

## 2018-11-16 ENCOUNTER — Encounter (HOSPITAL_COMMUNITY): Payer: Self-pay

## 2018-11-16 ENCOUNTER — Inpatient Hospital Stay (HOSPITAL_COMMUNITY)
Admission: AD | Admit: 2018-11-16 | Discharge: 2018-11-22 | DRG: 885 | Disposition: A | Discharge status: Home / Self Care | Payer: Medicare Other | Source: Intra-hospital | Attending: Psychiatry | Admitting: Psychiatry

## 2018-11-16 DIAGNOSIS — F1420 Cocaine dependence, uncomplicated: Secondary | ICD-10-CM

## 2018-11-16 DIAGNOSIS — F332 Major depressive disorder, recurrent severe without psychotic features: Secondary | ICD-10-CM | POA: Diagnosis present

## 2018-11-16 DIAGNOSIS — E119 Type 2 diabetes mellitus without complications: Secondary | ICD-10-CM | POA: Diagnosis present

## 2018-11-16 DIAGNOSIS — Z8249 Family history of ischemic heart disease and other diseases of the circulatory system: Secondary | ICD-10-CM | POA: Diagnosis not present

## 2018-11-16 DIAGNOSIS — F1410 Cocaine abuse, uncomplicated: Secondary | ICD-10-CM | POA: Diagnosis not present

## 2018-11-16 DIAGNOSIS — F1024 Alcohol dependence with alcohol-induced mood disorder: Secondary | ICD-10-CM | POA: Diagnosis present

## 2018-11-16 DIAGNOSIS — Z915 Personal history of self-harm: Secondary | ICD-10-CM | POA: Diagnosis not present

## 2018-11-16 DIAGNOSIS — F10239 Alcohol dependence with withdrawal, unspecified: Secondary | ICD-10-CM | POA: Diagnosis present

## 2018-11-16 DIAGNOSIS — F17210 Nicotine dependence, cigarettes, uncomplicated: Secondary | ICD-10-CM | POA: Diagnosis present

## 2018-11-16 DIAGNOSIS — I252 Old myocardial infarction: Secondary | ICD-10-CM | POA: Diagnosis not present

## 2018-11-16 DIAGNOSIS — E785 Hyperlipidemia, unspecified: Secondary | ICD-10-CM | POA: Diagnosis present

## 2018-11-16 DIAGNOSIS — I2510 Atherosclerotic heart disease of native coronary artery without angina pectoris: Secondary | ICD-10-CM | POA: Diagnosis present

## 2018-11-16 DIAGNOSIS — F419 Anxiety disorder, unspecified: Secondary | ICD-10-CM | POA: Diagnosis present

## 2018-11-16 DIAGNOSIS — Z7951 Long term (current) use of inhaled steroids: Secondary | ICD-10-CM | POA: Diagnosis not present

## 2018-11-16 DIAGNOSIS — F1490 Cocaine use, unspecified, uncomplicated: Secondary | ICD-10-CM | POA: Diagnosis not present

## 2018-11-16 DIAGNOSIS — I10 Essential (primary) hypertension: Secondary | ICD-10-CM | POA: Diagnosis present

## 2018-11-16 DIAGNOSIS — K219 Gastro-esophageal reflux disease without esophagitis: Secondary | ICD-10-CM | POA: Diagnosis present

## 2018-11-16 DIAGNOSIS — Z833 Family history of diabetes mellitus: Secondary | ICD-10-CM

## 2018-11-16 DIAGNOSIS — J449 Chronic obstructive pulmonary disease, unspecified: Secondary | ICD-10-CM | POA: Diagnosis present

## 2018-11-16 DIAGNOSIS — R45851 Suicidal ideations: Secondary | ICD-10-CM | POA: Diagnosis present

## 2018-11-16 DIAGNOSIS — G4700 Insomnia, unspecified: Secondary | ICD-10-CM | POA: Diagnosis present

## 2018-11-16 DIAGNOSIS — F1029 Alcohol dependence with unspecified alcohol-induced disorder: Secondary | ICD-10-CM | POA: Diagnosis not present

## 2018-11-16 MED ORDER — ADULT MULTIVITAMIN W/MINERALS CH
1.0000 | ORAL_TABLET | Freq: Every day | ORAL | Status: DC
  Administered 2018-11-16 – 2018-11-22 (×7): 1 via ORAL
  Filled 2018-11-16 (×11): qty 1

## 2018-11-16 MED ORDER — MOMETASONE FURO-FORMOTEROL FUM 200-5 MCG/ACT IN AERO
2.0000 | INHALATION_SPRAY | Freq: Two times a day (BID) | RESPIRATORY_TRACT | Status: DC
  Administered 2018-11-16 – 2018-11-21 (×12): 2 via RESPIRATORY_TRACT
  Filled 2018-11-16: qty 8.8

## 2018-11-16 MED ORDER — SERTRALINE HCL 25 MG PO TABS
25.0000 mg | ORAL_TABLET | Freq: Every day | ORAL | Status: DC
  Administered 2018-11-16 – 2018-11-18 (×3): 25 mg via ORAL
  Filled 2018-11-16 (×4): qty 1

## 2018-11-16 MED ORDER — NITROGLYCERIN 0.4 MG SL SUBL: 0.4000 mg | SUBLINGUAL_TABLET | SUBLINGUAL | Status: DC | PRN

## 2018-11-16 MED ORDER — ALBUTEROL SULFATE HFA 108 (90 BASE) MCG/ACT IN AERS: 1.0000 | INHALATION_SPRAY | Freq: Four times a day (QID) | RESPIRATORY_TRACT | Status: DC | PRN

## 2018-11-16 MED ORDER — LISINOPRIL 5 MG PO TABS
5.0000 mg | ORAL_TABLET | Freq: Every day | ORAL | Status: DC
  Administered 2018-11-16 – 2018-11-22 (×7): 5 mg via ORAL
  Filled 2018-11-16 (×9): qty 1

## 2018-11-16 MED ORDER — LORAZEPAM 1 MG PO TABS: 1.0000 mg | ORAL_TABLET | Freq: Four times a day (QID) | ORAL | Status: AC | PRN

## 2018-11-16 MED ORDER — PANTOPRAZOLE SODIUM 40 MG PO TBEC
40.0000 mg | DELAYED_RELEASE_TABLET | Freq: Every day | ORAL | Status: DC
  Administered 2018-11-16 – 2018-11-22 (×7): 40 mg via ORAL
  Filled 2018-11-16 (×9): qty 1

## 2018-11-16 MED ORDER — LORAZEPAM 1 MG PO TABS
1.0000 mg | ORAL_TABLET | Freq: Three times a day (TID) | ORAL | Status: AC
  Administered 2018-11-17 – 2018-11-18 (×3): 1 mg via ORAL
  Filled 2018-11-16 (×3): qty 1

## 2018-11-16 MED ORDER — ASPIRIN EC 81 MG PO TBEC
81.0000 mg | DELAYED_RELEASE_TABLET | Freq: Every day | ORAL | Status: DC
  Administered 2018-11-16 – 2018-11-22 (×7): 81 mg via ORAL
  Filled 2018-11-16 (×10): qty 1

## 2018-11-16 MED ORDER — ISOSORBIDE MONONITRATE ER 30 MG PO TB24
30.0000 mg | ORAL_TABLET | Freq: Every day | ORAL | Status: DC
  Administered 2018-11-16 – 2018-11-22 (×7): 30 mg via ORAL
  Filled 2018-11-16 (×9): qty 1

## 2018-11-16 MED ORDER — HYDROXYZINE HCL 25 MG PO TABS: 25.0000 mg | ORAL_TABLET | Freq: Four times a day (QID) | ORAL | Status: DC | PRN

## 2018-11-16 MED ORDER — MAGNESIUM HYDROXIDE 400 MG/5ML PO SUSP: 30.0000 mL | Freq: Every day | ORAL | Status: DC | PRN

## 2018-11-16 MED ORDER — HYDROXYZINE HCL 50 MG PO TABS
50.0000 mg | ORAL_TABLET | Freq: Four times a day (QID) | ORAL | Status: DC | PRN
  Administered 2018-11-19 – 2018-11-21 (×3): 50 mg via ORAL
  Filled 2018-11-16 (×3): qty 1

## 2018-11-16 MED ORDER — ACETAMINOPHEN 325 MG PO TABS
650.0000 mg | ORAL_TABLET | Freq: Four times a day (QID) | ORAL | Status: DC | PRN
  Administered 2018-11-18: 650 mg via ORAL
  Filled 2018-11-16: qty 2

## 2018-11-16 MED ORDER — LORAZEPAM 1 MG PO TABS
1.0000 mg | ORAL_TABLET | Freq: Four times a day (QID) | ORAL | Status: AC
  Administered 2018-11-16 – 2018-11-17 (×4): 1 mg via ORAL
  Filled 2018-11-16 (×4): qty 1

## 2018-11-16 MED ORDER — VITAMIN B-1 100 MG PO TABS
100.0000 mg | ORAL_TABLET | Freq: Every day | ORAL | Status: DC
  Administered 2018-11-17 – 2018-11-22 (×6): 100 mg via ORAL
  Filled 2018-11-16 (×8): qty 1

## 2018-11-16 MED ORDER — ALUM & MAG HYDROXIDE-SIMETH 200-200-20 MG/5ML PO SUSP: 30.0000 mL | ORAL | Status: DC | PRN

## 2018-11-16 MED ORDER — THIAMINE HCL 100 MG/ML IJ SOLN: 100.0000 mg | Freq: Once | INTRAMUSCULAR | Status: DC

## 2018-11-16 MED ORDER — ATORVASTATIN CALCIUM 40 MG PO TABS
80.0000 mg | ORAL_TABLET | Freq: Every day | ORAL | Status: DC
  Administered 2018-11-16 – 2018-11-21 (×6): 80 mg via ORAL
  Filled 2018-11-16 (×8): qty 2

## 2018-11-16 MED ORDER — METFORMIN HCL 500 MG PO TABS
500.0000 mg | ORAL_TABLET | Freq: Two times a day (BID) | ORAL | Status: DC
  Administered 2018-11-16 – 2018-11-22 (×12): 500 mg via ORAL
  Filled 2018-11-16 (×16): qty 1

## 2018-11-16 MED ORDER — LORAZEPAM 1 MG PO TABS
1.0000 mg | ORAL_TABLET | Freq: Two times a day (BID) | ORAL | Status: AC
  Administered 2018-11-18 – 2018-11-19 (×2): 1 mg via ORAL
  Filled 2018-11-16 (×3): qty 1

## 2018-11-16 MED ORDER — METOPROLOL TARTRATE 50 MG PO TABS
50.0000 mg | ORAL_TABLET | Freq: Two times a day (BID) | ORAL | Status: DC
  Administered 2018-11-17 – 2018-11-22 (×11): 50 mg via ORAL
  Filled 2018-11-16 (×16): qty 1

## 2018-11-16 MED ORDER — NICOTINE 21 MG/24HR TD PT24
21.0000 mg | MEDICATED_PATCH | Freq: Every day | TRANSDERMAL | Status: DC
  Administered 2018-11-16 – 2018-11-22 (×7): 21 mg via TRANSDERMAL
  Filled 2018-11-16 (×9): qty 1

## 2018-11-16 MED ORDER — ONDANSETRON 4 MG PO TBDP: 4.0000 mg | ORAL_TABLET | Freq: Four times a day (QID) | ORAL | Status: AC | PRN

## 2018-11-16 MED ORDER — LORAZEPAM 1 MG PO TABS
1.0000 mg | ORAL_TABLET | Freq: Every day | ORAL | Status: AC
  Administered 2018-11-20: 1 mg via ORAL
  Filled 2018-11-16: qty 1

## 2018-11-16 MED ORDER — LOPERAMIDE HCL 2 MG PO CAPS: 2.0000 mg | ORAL_CAPSULE | ORAL | Status: AC | PRN

## 2018-11-16 MED ORDER — TICAGRELOR 90 MG PO TABS
90.0000 mg | ORAL_TABLET | Freq: Two times a day (BID) | ORAL | Status: DC
  Administered 2018-11-16 – 2018-11-22 (×12): 90 mg via ORAL
  Filled 2018-11-16 (×16): qty 1

## 2018-11-16 NOTE — BH Assessment (Signed)
Tele Assessment Note   Patient Name: Warren Salas MRN: [id number] Referring Physician: Bailey MechJason Phillips, [facility] at [facility] Location of Patient: [facility]-300B IP ADULT Location of Provider: Behavioral Health TTS Department  Warren Salas is an 57 y.o. male.  Patient was at a hotel he has been staying at for the last three weeks.  He has been very depressed for the last month.  He reports that he has not been able to afford the medication he was prescribed when [facility].  He told a friend to call 911 for him because he was feeling suicidal.  Patient says "I'm tired of living, I don't want to wake up anymore."  He had planned to stab himself.  "If I had my knife, I would not be here right now."  Pt has had one previous suicide attempt.     Pt denies any HI or A/V hallucinations.  Patient drinks about 12 pack of beer a day.  He has a BAL of .25 at 20:33.  He uses cocaine every "once in a while."  Pt is positive for cocaine.     Patient has poor sleep.  Patient says his appetite "comes and goes."     patient has no outpatient mental health care.  [facility].  [facility] to services of Dr. Jama Flavorsobos.  AC Kim said that patient can come after 08:00.  Diagnosis: F33.2 MDD recurrent, severe; F10.20 ETOH use d/o severe  Past Medical History:  Past Medical History:  Diagnosis Date  . Back pain   . COPD (chronic obstructive pulmonary disease) (HCC)   . Diabetes mellitus without complication (HCC)   . Hypertension   . STEMI (ST elevation myocardial infarction) (HCC) 10/2017    Past Surgical History:  Procedure Laterality Date  . APPENDECTOMY    . CORONARY STENT INTERVENTION N/A 12/29/2017   Procedure: CORONARY STENT INTERVENTION - LAD;  Surgeon: Corky CraftsVaranasi, Jayadeep S, MD;  Location: [facility] INVASIVE CV LAB;  Service: Cardiovascular;  Laterality: N/A;  . CORONARY/GRAFT ACUTE MI REVASCULARIZATION N/A 11/01/2017   Procedure: Coronary/Graft  Acute MI Revascularization;  Surgeon: Corky CraftsVaranasi, Jayadeep S, MD;  [facility];  Service: Cardiovascular;  Laterality: N/A;  . LEFT HEART CATH AND CORONARY ANGIOGRAPHY N/A 11/01/2017   Procedure: LEFT HEART CATH AND CORONARY ANGIOGRAPHY;  Surgeon: Corky CraftsVaranasi, Jayadeep S, MD;  [facility];  Service: Cardiovascular;  Laterality: N/A;    Family History:  Family History  Problem Relation Age of Onset  . Hypertension Mother   . Diabetes Mother   . Hypertension Father   . Diabetes Father     Social History:  reports that he has been smoking cigarettes. He has been smoking about 1.00 pack per day. He has never used smokeless tobacco. He reports that he drinks alcohol. He reports that he has current or past drug history.  Additional Social History:  Alcohol / Drug Use Pain Medications: [facility] Prescriptions: [facility] Over the Counter: [facility] History of alcohol / drug use?: Yes Withdrawal Symptoms: Sweats, Nausea / Vomiting, Tremors, Fever / Chills, Diarrhea, Patient aware of relationship between substance abuse and physical/medical complications Substance #1 Name of Substance 1: ETOH (beer) 1 - Age of First Use: Teens 1 - Amount (size/oz): 12 pack  1 - Frequency: daily 1 - Duration: on-going 1 - Last  Use / Amount: 11/15/18 Substance #2 Name of Substance 2: Cocaine 2 - Age of First Use: Unknown 2 - Amount (size/oz): Varies 2 - Frequency: Varies 2 - Duration: off and on 2 - Last Use / Amount: Pt can't recall  CIWA:   COWS:    Allergies: No Known Allergies  Home Medications:  No medications prior to admission.    OB/GYN Status:  No LMP for male patient.  General Assessment Data Location of Assessment: [facility] TTS Assessment: Out of system Is this a Tele or Face-to-Face Assessment?: Tele Assessment Is this an Initial Assessment or a Re-assessment for this encounter?: Initial  Assessment Patient Accompanied by:: N/A Language Other than English: No Living Arrangements: Homeless/Shelter(Pt staying at a hotel for the last week.) What gender do you identify as?: Male Marital status: Divorced Pregnancy Status: No Living Arrangements: Alone, Other (Comment)(Pt is homeless) Can pt return to current living arrangement?: Yes Admission Status: Voluntary Is patient capable of signing voluntary admission?: Yes Referral Source: Self/Family/Friend Insurance type: MCR/MCD     Crisis Care Plan Living Arrangements: Alone, Other (Comment)(Pt is homeless) Name of Psychiatrist: None Name of Therapist: None  Education Status Is patient currently in school?: No Highest grade of school patient has completed: 12th Is the patient employed, unemployed or receiving disability?: Receiving disability income  Risk to self with the past 6 months Suicidal Ideation: Yes-Currently Present Has patient been a risk to self within the past 6 months prior to admission? : Yes Suicidal Intent: Yes-Currently Present Has patient had any suicidal intent within the past 6 months prior to admission? : Yes Is patient at risk for suicide?: Yes Suicidal Plan?: Yes-Currently Present Has patient had any suicidal plan within the past 6 months prior to admission? : Yes Specify Current Suicidal Plan: Stab himself Access to Means: Yes Specify Access to Suicidal Means: Had a knife. What has been your use of drugs/alcohol within the last 12 months?: ETOH Previous Attempts/Gestures: Yes How many times?: 1 Other Self Harm Risks: None Triggers for Past Attempts: None known Intentional Self Injurious Behavior: None Family Suicide History: Unknown Recent stressful life event(s): Financial Problems, Turmoil (Comment) Persecutory voices/beliefs?: Yes Depression: Yes Depression Symptoms: Despondent, Insomnia, Isolating, Guilt, Loss of interest in usual pleasures, Feeling worthless/self pity Substance  abuse history and/or treatment for substance abuse?: Yes Suicide prevention information given to non-admitted patients: Not applicable  Risk to Others within the past 6 months Homicidal Ideation: No Does patient have any lifetime risk of violence toward others beyond the six months prior to admission? : No Thoughts of Harm to Others: No Current Homicidal Intent: No Current Homicidal Plan: No Access to Homicidal Means: No Identified Victim: No one History of harm to others?: No Assessment of Violence: None Noted Violent Behavior Description: None reported Does patient have access to weapons?: No Criminal Charges Pending?: No Does patient have a court date: No Is patient on probation?: No  Psychosis Hallucinations: None noted Delusions: None noted  Mental Status Report Appearance/Hygiene: Disheveled, Body odor, In scrubs Eye Contact: Poor Motor Activity: Freedom of movement, Unremarkable Speech: Logical/coherent Level of Consciousness: Drowsy Mood: Depressed, Empty, Despair Affect: Sad, Depressed Anxiety Level: Minimal Thought Processes: Coherent, Relevant Judgement: Impaired Orientation: Appropriate for developmental age Obsessive Compulsive Thoughts/Behaviors: None  Cognitive Functioning Concentration: Poor Memory: Recent Impaired, Remote Intact Is patient IDD: No Insight: Fair Impulse Control: Poor Appetite: Fair Have you had any weight changes? : No Change Sleep: Decreased Total Hours of Sleep: (<5H/D) Vegetative Symptoms: Decreased grooming  ADLScreening [facility] Assessment Services) Patient's cognitive ability adequate to safely complete daily activities?: Yes Patient able to express need for assistance with ADLs?: Yes Independently performs ADLs?: Yes (appropriate for developmental age)  Prior Inpatient Therapy Prior Inpatient Therapy: Yes Prior Therapy Dates: 10/24/18 Prior Therapy Facilty/Provider(s): [facility] Reason for Treatment: SI/ ETOH  Prior Outpatient  Therapy Prior Outpatient Therapy: No Does patient have an ACCT team?: No Does patient have Intensive In-House Services?  : No Does patient have [facility] services? : No Does patient have P4CC services?: No  ADL Screening (condition at time of admission) Patient's cognitive ability adequate to safely complete daily activities?: Yes Is the patient deaf or have difficulty hearing?: No Does the patient have difficulty seeing, even when wearing glasses/contacts?: No Does the patient have difficulty concentrating, remembering, or making decisions?: Yes Patient able to express need for assistance with ADLs?: Yes Does the patient have difficulty dressing or bathing?: No Independently performs ADLs?: Yes (appropriate for developmental age) Does the patient have difficulty walking or climbing stairs?: No Weakness of Legs: None Weakness of Arms/Hands: None       Abuse/Neglect Assessment (Assessment to be complete while patient is alone) Physical Abuse: Denies Verbal Abuse: Denies Sexual Abuse: Denies Exploitation of patient/patient's resources: Denies Self-Neglect: Denies     Merchant navy officer (For Healthcare) Does Patient Have a Medical Advance Directive?: No Would patient like information on creating a medical advance directive?: No - Patient declined          Disposition:  Disposition Initial Assessment Completed for this Encounter: Yes Patient referred to: Other (Comment)(Pt accepted to [facility] 306-1)  This service was provided via telemedicine using a 2-way, interactive audio and video technology.  Names of all persons participating in this telemedicine service and their role in this encounter. Name: Warren Salas Role: patient  Name: Beatriz Stallion, M.S. LCAS QP Role: clinician  Name:  Role:   Name:  Role:     [place] Lodge 11/16/2018 2:10 AM

## 2018-11-16 NOTE — Progress Notes (Signed)
D:  Warren Salas was in his bed at beginning of shift.  He was pleasant and cooperative.  He denied any SI/HI or A/V hallucinations.  He did report that he feels "groggy" this evening.  He denied any withdrawal symptoms.  He did get up for evening snack and sat in the day room until time for hs medications.  He took his hs medications without difficulty.  He is currently resting with his eyes closed and appears to be in no physical distress.   A:  1:1 with RN for support and encouragement.  Medications as ordered.  Q 15 minute checks maintained for safety.  Encouraged participation in group and unit activities.   R:  Warren Salas remains safe on the unit.  We will continue to monitor the progress towards his goals.

## 2018-11-16 NOTE — Progress Notes (Signed)
Patient is a 57 year [facility] medication noncompliance as well as alcoholism and occasional cocaine use. Patient said he lives alone in an apartment, and after his disability income comes in and he pays his bills, he doesn't have enough money to pay for his medications. Therefore, making him noncompliant. He said when he was taking his medications he did feel better. Patient's UDS was positive for cocaine and he said he drinks about 10-12 beers daily. Patient is currently suicidal with no plan. Contracts for safety while in the hospital. Skin assessment was performed and found unremarkable. Patient was oriented to the unit. Safety is maintained with 15 minute checks as well as environmental checks. Will continue to monitor and give support.
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
## 2018-11-16 NOTE — BHH Suicide Risk Assessment (Signed)
B[facility] INPATIENT:  Family/Significant Other Suicide Prevention Education  Suicide Prevention Education:  Patient Refusal for Family/Significant Other Suicide Prevention Education: The patient Warren Salas has refused to provide written consent for family/significant other to be provided Family/Significant Other Suicide Prevention Education during admission and/or prior to discharge.  Physician notified.  Cherie Bohaboy 11/16/2018, 2:02 PM

## 2018-11-16 NOTE — Progress Notes (Signed)
Patient ID: Warren Salas, male   DOB: Feb 04, 1961, 57 y.o.   MRN: [id number] Per State regulations 482.30 this chart was reviewed for medical necessity with respect to the patient's admission/duration of stay.    Next review date: 11/20/18  Thurman CoyerEric Cheng Dec, BSN, RN-BC  Case Manager

## 2018-11-16 NOTE — H&P (Signed)
Psychiatric Admission Assessment Adult  Patient Identification: Warren Salas MRN:  [id number] Date of Evaluation:  11/16/2018 Chief Complaint:  MDD recurrent severe  ETOH severe Principal Diagnosis: MDD (major depressive disorder), recurrent severe, without psychosis (HCC) Diagnosis:  Principal Problem:   MDD (major depressive disorder), recurrent severe, without psychosis (HCC) Active Problems:   Alcohol dependence with alcohol-induced mood disorder (HCC)   Moderate cocaine use disorder (HCC)  History of Present Illness:   Warren Salas is a 45 M with history of MDD, alcohol use disorder, and cocaine use disorder who was admitted voluntarily from [facility] hospital with worsening depression, SI with plan to cut himself with a knife, worsening use of alcohol, and worsening use of cocaine. Pt has recent relevant history of discharge from [facility] for similar presentation on 10/27/18 to outpatient level of care. Pt was medically cleared and then transferred to [facility] for additional treatment and stabilization.  Upon initial interview, pt shares recent history after most recent discharge from [facility], stating, "Eerything started going good, but I didn't take my depression and anxiety medications, and then I basically got back to the way I was before I came in." Pt shares that he was not able to afford the $3 co-pay for his medications, so he did not fill the prescriptions for both his psychotropic and non-psychotropic medications. Pt was confronted with his report that he has been staying in a motel and paying for alcohol and cocaine, and he acknowledges this, stating, "I know better; guess I just need help." Pt reports worsening mood symptoms of depression, initial insomnia, anhedonia, guilty feelings, worthlessness, hopelessness, low energy, poor concentration, poor appetite, and psychomotor retardation. He endorses SI with plan to cut himself with a knife, and he is able to contract for safety while in the  hospital. He denies HI/AH/VH. He denies symptoms of hypomania/mania, OCD, and PTSD. Her reports drinking about 12 beers per day, using cocaine about 3 days per week (via insufflation), and smoking tobacco 2ppd. He denies other illicit substance use.  Discussed with patient about treatment options. He would like to resume on his previous treatment regimen of zoloft as well as resuming his medications for hx of CAD, HLD, and DMII. He will be continued on CIWA with ativan taper. He is open to referral to residential substance use treatment, especially in context of being able to transition to stable housing in a sober living environment after completion of initial residential substance use treatment. He will work with SW team regarding this referral. Pt is in agreement with the above plan, and he had no further questions, comments, or concerns.  Associated Signs/Symptoms: Depression Symptoms:  depressed mood, anhedonia, insomnia, psychomotor retardation, fatigue, feelings of worthlessness/guilt, difficulty concentrating, suicidal thoughts with specific plan, anxiety, loss of energy/fatigue, disturbed sleep, decreased appetite, (Hypo) Manic Symptoms:  Distractibility, Impulsivity, Anxiety Symptoms:  Excessive Worry, Psychotic Symptoms:  NA PTSD Symptoms: NA Total Time spent with patient: 1 hour  Past Psychiatric History:  - dx of MDD, alcohol use disorder, and cocaine use disorder - previous admission to [facility] on 10/25/18 - no current outpatient provider - hx of suicide attempt x2 (pt declines to disclose previous methods)  Is the patient at risk to self? Yes.    Has the patient been a risk to self in the past 6 months? Yes.    Has the patient been a risk to self within the distant past? Yes.    Is the patient a risk to others? Yes.    Has the  patient been a risk to others in the past 6 months? Yes.    Has the patient been a risk to others within the distant past? Yes.     Prior  Inpatient Therapy: Prior Inpatient Therapy: Yes Prior Therapy Dates: 10/24/18 [facility] Reason for Treatment: SI/ ETOH Prior Outpatient Therapy: Prior Outpatient Therapy: No Does patient have an ACCT team?: No Does patient have Intensive In-House Services?  : No Does patient have [facility] services? : No Does patient have P4CC services?: No  Alcohol Screening:   Substance Abuse History in the last 12 months:  Yes.   Consequences of Substance Abuse: Medical Consequences:  worsened mood and anxiety symptoms Previous Psychotropic Medications: Yes  Psychological Evaluations: Yes  Past Medical History:  Past Medical History:  Diagnosis Date  . Back pain   . COPD (chronic obstructive pulmonary disease) (HCC)   . Diabetes mellitus without complication (HCC)   . Hypertension   . STEMI (ST elevation myocardial infarction) (HCC) 10/2017    Past Surgical History:  Procedure Laterality Date  . APPENDECTOMY    . CORONARY STENT INTERVENTION N/A 12/29/2017   Procedure: CORONARY STENT INTERVENTION - LAD;  Surgeon: Corky CraftsVaranasi, Jayadeep S, MD;  Location: [facility] INVASIVE CV LAB;  Service: Cardiovascular;  Laterality: N/A;  . CORONARY/GRAFT ACUTE MI REVASCULARIZATION N/A 11/01/2017   Procedure: Coronary/Graft Acute MI Revascularization;  Surgeon: Corky CraftsVaranasi, Jayadeep S, MD;  [facility];  Service: Cardiovascular;  Laterality: N/A;  . LEFT HEART CATH AND CORONARY ANGIOGRAPHY N/A 11/01/2017   Procedure: LEFT HEART CATH AND CORONARY ANGIOGRAPHY;  Surgeon: Corky CraftsVaranasi, Jayadeep S, MD;  [facility];  Service: Cardiovascular;  Laterality: N/A;   Family History:  Family History  Problem Relation Age of Onset  . Hypertension Mother   . Diabetes Mother   . Hypertension Father   . Diabetes Father    Family Psychiatric  History: denies family psychiatric history. Tobacco Screening:   Social History: [place] was [place], [place] but he does not have stable housing, and [place] He completed 12th grade. He is on disability. He has never been married. He has 3 adult children (ages 9838, 11037, and 832) with whom he has no contact. He has legal history of DUI x2. He denies trauma history. Social History   Substance and Sexual Activity  Alcohol Use Yes     Social History   Substance and Sexual Activity  Drug Use Yes   Comment: heroin     Additional Social History: Marital status: Divorced Divorced, when?: "30 years ago" What types of issues is patient dealing with in the relationship?: He stated that his drinking has interfered with all aspects of his life Are you sexually active?: No What is your sexual orientation?: Heterosexual Has your sexual activity been affected by drugs, alcohol, medication, or emotional stress?: Yes How many children?: 3 How is patient's relationship with their children?: [name] stated that he had a good relationship with his son, but does not see his daughters (they are all in [place])    Pain Medications: [facility] Prescriptions: [facility] Over the Counter: [facility] History of alcohol / drug use?: Yes Withdrawal Symptoms: Sweats, Nausea / Vomiting, Tremors, Fever / Chills, Diarrhea, Patient aware of relationship between substance abuse and physical/medical complications Name of Substance 1: ETOH (beer) 1 - Age  of First Use: Teens 1 - Amount (size/oz): 12 pack  1 - Frequency: daily 1 - Duration: on-going 1 - Last Use / Amount: 11/15/18 Name of Substance 2: Cocaine 2 - Age of First Use: Unknown 2 - Amount (size/oz): Varies 2 - Frequency: Varies 2 - Duration: off and on 2 - Last Use / Amount: Pt can't recall                Allergies:  No Known Allergies Lab Results: No results found for this or any previous visit (from the past 48 hour(s)).  Blood Alcohol level:  Lab Results  Component  Value Date   ETH 251 (H) 10/24/2018   ETH 198 (H) 10/22/2018    Metabolic Disorder Labs:  Lab Results  Component Value Date   HGBA1C 6.1 (H) 11/01/2017   MPG 128.37 11/01/2017   No results found for: PROLACTIN Lab Results  Component Value Date   CHOL 145 11/01/2017   TRIG 100 11/01/2017   HDL 77 11/01/2017   CHOLHDL 1.9 11/01/2017   VLDL 20 11/01/2017   LDLCALC 48 11/01/2017    Current Medications: Current Facility-Administered Medications  Medication Dose Route Frequency Provider Last Rate Last Dose  . acetaminophen (TYLENOL) tablet 650 mg  650 mg Oral Q6H PRN Donell Sievert E, PA-C      . albuterol (PROVENTIL HFA;VENTOLIN HFA) 108 (90 Base) MCG/ACT inhaler 1-2 puff  1-2 puff Inhalation Q6H PRN Micheal Likens, MD      . alum & mag hydroxide-simeth (MAALOX/MYLANTA) 200-200-20 MG/5ML suspension 30 mL  30 mL Oral Q4H PRN Kerry Hough, PA-C      . aspirin EC tablet 81 mg  81 mg Oral Daily Micheal Likens, MD   81 mg at 11/16/18 1350  . atorvastatin (LIPITOR) tablet 80 mg  80 mg Oral q1800 Micheal Likens, MD      . hydrOXYzine (ATARAX/VISTARIL) tablet 50 mg  50 mg Oral Q6H PRN Micheal Likens, MD      . isosorbide mononitrate (IMDUR) 24 hr tablet 30 mg  30 mg Oral Daily Micheal Likens, MD   30 mg at 11/16/18 1350  . lisinopril (PRINIVIL,ZESTRIL) tablet 5 mg  5 mg Oral Daily Micheal Likens, MD   5 mg at 11/16/18 1350  . loperamide (IMODIUM) capsule 2-4 mg  2-4 mg Oral PRN Kerry Hough, PA-C      . LORazepam (ATIVAN) tablet 1 mg  1 mg Oral Q6H PRN Kerry Hough, PA-C      . LORazepam (ATIVAN) tablet 1 mg  1 mg Oral QID Donell Sievert E, PA-C   1 mg at 11/16/18 1217   Followed by  . [START ON 11/17/2018] LORazepam (ATIVAN) tablet 1 mg  1 mg Oral TID Kerry Hough, PA-C       Followed by  . [START ON 11/18/2018] LORazepam (ATIVAN) tablet 1 mg  1 mg Oral BID Donell Sievert E, PA-C       Followed by  . [START ON  11/20/2018] LORazepam (ATIVAN) tablet 1 mg  1 mg Oral Daily Simon, Spencer E, PA-C      . magnesium hydroxide (MILK OF MAGNESIA) suspension 30 mL  30 mL Oral Daily PRN Donell Sievert E, PA-C      . metFORMIN (GLUCOPHAGE) tablet 500 mg  500 mg Oral BID Jolyne Loa T, MD      . metoprolol tartrate (LOPRESSOR) tablet 50 mg  50 mg Oral BID Micheal Likens,  MD      . mometasone-formoterol (DULERA) 200-5 MCG/ACT inhaler 2 puff  2 puff Inhalation BID Micheal Likens, MD   2 puff at 11/16/18 1350  . multivitamin with minerals tablet 1 tablet  1 tablet Oral Daily Kerry Hough, PA-C   1 tablet at 11/16/18 1350  . nicotine (NICODERM CQ - dosed in mg/24 hours) patch 21 mg  21 mg Transdermal Daily Sherelle Castelli, Burlene Arnt, MD      . nitroGLYCERIN (NITROSTAT) SL tablet 0.4 mg  0.4 mg Sublingual Q5 min PRN Micheal Likens, MD      . ondansetron (ZOFRAN-ODT) disintegrating tablet 4 mg  4 mg Oral Q6H PRN Donell Sievert E, PA-C      . pantoprazole (PROTONIX) EC tablet 40 mg  40 mg Oral Daily Micheal Likens, MD   40 mg at 11/16/18 1349  . sertraline (ZOLOFT) tablet 25 mg  25 mg Oral Daily Micheal Likens, MD   25 mg at 11/16/18 1350  . thiamine (B-1) injection 100 mg  100 mg Intramuscular Once Kerry Hough, PA-C      . [START ON 11/17/2018] thiamine (VITAMIN B-1) tablet 100 mg  100 mg Oral Daily Simon, Spencer E, PA-C      . ticagrelor (BRILINTA) tablet 90 mg  90 mg Oral BID Micheal Likens, MD       PTA Medications: Medications Prior to Admission  Medication Sig Dispense Refill Last Dose  . albuterol (PROVENTIL HFA;VENTOLIN HFA) 108 (90 Base) MCG/ACT inhaler Inhale 1-2 puffs into the lungs every 6 (six) hours as needed for wheezing. 1 Inhaler 0 Past Month at Unknown time  . aspirin EC 81 MG tablet Take 81 mg by mouth daily.   unk  . atorvastatin (LIPITOR) 80 MG tablet Take 1 tablet (80 mg total) by mouth daily at 6 PM. 90 tablet 3 unk  .  budesonide-formoterol (SYMBICORT) 160-4.5 MCG/ACT inhaler Inhale 2 puffs into the lungs 2 (two) times daily as needed (For shortness of breath.).    unk  . hydrOXYzine (ATARAX/VISTARIL) 25 MG tablet Take 1 tablet (25 mg total) by mouth every 6 (six) hours as needed for anxiety. 30 tablet 0   . isosorbide mononitrate (IMDUR) 30 MG 24 hr tablet Take 1 tablet (30 mg total) by mouth daily. 90 tablet 3 unk  . lisinopril (PRINIVIL,ZESTRIL) 10 MG tablet Take 0.5 tablets (5 mg total) by mouth daily. 45 tablet 3 unk  . metFORMIN (GLUCOPHAGE) 500 MG tablet Take 1 tablet (500 mg total) by mouth 2 (two) times daily. 60 tablet 0   . metoprolol tartrate (LOPRESSOR) 50 MG tablet Take 1 tablet (50 mg total) by mouth 2 (two) times daily. 180 tablet 3 unk  . nitroGLYCERIN (NITROSTAT) 0.4 MG SL tablet Place 1 tablet (0.4 mg total) under the tongue every 5 (five) minutes as needed. 25 tablet 2 unk at prn  . omeprazole (PRILOSEC) 20 MG capsule Take 1 capsule (20 mg total) by mouth daily. 14 capsule 0 unk  . sertraline (ZOLOFT) 25 MG tablet Take 1 tablet (25 mg total) by mouth daily. For mood control 30 tablet 0   . ticagrelor (BRILINTA) 90 MG TABS tablet Take 1 tablet (90 mg total) by mouth 2 (two) times daily. 180 tablet 3 unk    Musculoskeletal: Strength & Muscle Tone: within normal limits Gait & Station: normal Patient leans: N/A  Psychiatric Specialty Exam: Physical Exam  Nursing note and vitals reviewed.   Review of Systems  Constitutional: Negative for chills and fever.  Respiratory: Negative for cough and shortness of breath.   Cardiovascular: Negative for chest pain.  Gastrointestinal: Negative for abdominal pain, heartburn, nausea and vomiting.  Psychiatric/Behavioral: Positive for depression, substance abuse and suicidal ideas. Negative for hallucinations. The patient is nervous/anxious and has insomnia.     Blood pressure 130/76, pulse (!) 101, resp. rate 18, SpO2 99 %.There is no height or weight  on file to calculate BMI.  General Appearance: Casual and Fairly Groomed  Eye Contact:  Fair  Speech:  Clear and Coherent and Normal Rate  Volume:  Normal  Mood:  Anxious and Depressed  Affect:  Congruent, Depressed and Flat  Thought Process:  Coherent and Goal Directed  Orientation:  Full (Time, Place, and Person)  Thought Content:  Logical  Suicidal Thoughts:  Yes.  with intent/plan  Homicidal Thoughts:  No  Memory:  Immediate;   Fair Recent;   Fair Remote;   Fair  Judgement:  Poor  Insight:  Lacking  Psychomotor Activity:  Normal  Concentration:  Concentration: Fair  Recall:  [employer] of Knowledge:  Fair  Language:  Fair  Akathisia:  No  Handed:    AIMS (if indicated):     Assets:  Desire for Improvement Financial Resources/Insurance Resilience  ADL's:  Intact  Cognition:  WNL  Sleep:       Treatment Plan Summary: Daily contact with patient to assess and evaluate symptoms and progress in treatment and Medication management  Observation Level/Precautions:  15 minute checks  Laboratory:  CBC Chemistry Profile HbAIC UDS UA  Psychotherapy:  Encourage participation in groups and therapeutic milieu   Medications:  Start zoloft 25mg  po qDay. Resume aspirin 81mg  po qDay. Resume lipitor 80mg  po qDay. Resume Imdur 30mg  po qDay. Resume lisinopril 5mg  po qDay. Continue CIWA with ativan taper and ativan 1mg  po q6h prn CIWA >10. Resume metformin 500mg  po BID. Resume dulera 200-5 mcg/act take 2 puffs inhaled BID. Resume nitrostat 0.4mg  SL q61min prn chest pain. Resume protonix 40mg  po qDay. Resume brilinta 90mg  po BID.  Consultations:    Discharge Concerns:    Estimated LOS: 5-7 days  Other:     Physician Treatment Plan for Primary Diagnosis: MDD (major depressive disorder), recurrent severe, without psychosis (HCC) Long Term Goal(s): Improvement in symptoms so as ready for discharge  Short Term Goals: Ability to identify and develop effective coping behaviors will  improve  Physician Treatment Plan for Secondary Diagnosis: Principal Problem:   MDD (major depressive disorder), recurrent severe, without psychosis (HCC) Active Problems:   Alcohol dependence with alcohol-induced mood disorder (HCC)   Moderate cocaine use disorder (HCC)  Long Term Goal(s): Improvement in symptoms so as ready for discharge  Short Term Goals: Ability to demonstrate self-control will improve  I certify that inpatient services furnished can reasonably be expected to improve the patient's condition.    Micheal Likens, MD 11/21/20191:53 PM

## 2018-11-16 NOTE — Tx Team (Signed)
Initial Treatment Plan 11/16/2018 12:39 PM Warren Salas Cliett ZOX:[id number]    PATIENT STRESSORS: Financial difficulties Medication change or noncompliance Substance abuse   PATIENT STRENGTHS: Ability for insight Active sense of humor Capable of independent living Communication skills   PATIENT IDENTIFIED PROBLEMS: Alcohol  Depression  "Need to keep my mind right."                 DISCHARGE CRITERIA:  Ability to meet basic life and health needs Adequate post-discharge living arrangements Improved stabilization in mood, thinking, and/or behavior  PRELIMINARY DISCHARGE PLAN: Outpatient therapy Placement in alternative living arrangements  PATIENT/FAMILY INVOLVEMENT: This treatment plan has been presented to and reviewed with the patient, Warren Salas Guillot.  The patient and family have been given the opportunity to ask questions and make suggestions.  Dewayne ShorterAlyssa  Camiya Vinal, RN 11/16/2018, 12:39 PM

## 2018-11-16 NOTE — Plan of Care (Signed)
Patient is able to contract for safety while on the unit.  Problem: Education: Goal: Emotional status will improve Outcome: Progressing Goal: Mental status will improve Outcome: Progressing Goal: Verbalization of understanding the information provided will improve Outcome: Progressing   Problem: Safety: Goal: Ability to disclose and discuss suicidal ideas will improve Outcome: Progressing

## 2018-11-17 LAB — LIPID PANEL
CHOL/HDL RATIO: 1.8 ratio
CHOLESTEROL: 109 mg/dL (ref 0–200)
HDL: 60 mg/dL (ref >40)
LDL Cholesterol: 31 mg/dL (ref 0–99)
Triglycerides: 89 mg/dL (ref <150)
VLDL: 18 mg/dL (ref 0–40)

## 2018-11-17 LAB — HEMOGLOBIN A1C
Hgb A1c MFr Bld: 5.2 % (ref 4.8–5.6)
MEAN PLASMA GLUCOSE: 102.54 mg/dL

## 2018-11-17 LAB — TSH: TSH: 1.963 u[IU]/mL (ref 0.350–4.500)

## 2018-11-17 NOTE — Progress Notes (Signed)
Patient ID: Warren Salas, male   DOB: 04/15/1961, 57 y.o.   MRN: [id number]  D. Pt presents with a blunted affect and cooperative, anxious behavior. Pt is alert and oriented X4 however gets confused with short directions. Pt states "I would like to stay here for a while. I don't know if I'll be ready to leave in 5-7 days." Pt currently denies SI/HI and AVH and agrees to contact staff before acting on any harmful thoughts.   A. Labs and vitals monitored. Pt given and educated on medications. Pt supported emotionally and encouraged to express concerns and ask questions.  Pt educated on fall risk precautions, refused any assistance. MD notified of patients behavior.   R. Pt remains safe with 15 minute checks. Pt voiced that he would like a place to stay for a while at discharge. Will continue POC.

## 2018-11-17 NOTE — Progress Notes (Signed)
Pt attended part of the AA meeting.

## 2018-11-17 NOTE — [facility] Suicide Risk Assessment (Signed)
[facility] Admission Suicide Risk Assessment   [facility]:  Patient Demographic factors:  Male, Caucasian, Low socioeconomic status, Living alone, Unemployed Current Mental Status:  Suicidal ideation indicated by patient, Self-harm thoughts Loss Factors:  Financial problems / change in socioeconomic status Historical Factors:  Prior suicide attempts, Impulsivity Risk Reduction Factors:  NA  Total Time spent with patient: 30 minutes Principal Problem: MDD (major depressive disorder), recurrent severe, without psychosis (HCC) Diagnosis:  Principal Problem:   MDD (major depressive disorder), recurrent severe, without psychosis (HCC) Active Problems:   Alcohol dependence with alcohol-induced mood disorder (HCC)   Moderate cocaine use disorder (HCC)  Subjective Data: see H&P  Continued Clinical Symptoms:  Alcohol Use Disorder Identification Test Final Score (AUDIT): 32 The "Alcohol Use Disorders Identification Test", Guidelines for Use in Primary Care, Second Edition.  World Science writerHealth Organization [facility](WHO). Score between 0-7:  no or low risk or alcohol related problems. Score between 8-15:  moderate risk of alcohol related problems. Score between 16-19:  high risk of alcohol related problems. Score 20 or above:  warrants further diagnostic evaluation for alcohol dependence and treatment.    Psychiatric Specialty Exam:     Blood pressure 139/82, pulse 91, temperature 98.2 F (36.8 C), temperature source Oral, resp. rate 18, height 5' 4.5" (1.638 m), weight 69.4 kg, SpO2 99 %.Body mass index is 25.86 kg/m.    COGNITIVE FEATURES THAT CONTRIBUTE TO RISK:  None    SUICIDE RISK:   Moderate:  Frequent suicidal ideation with limited intensity, and duration, some specificity in terms of plans, no associated intent, good self-control, limited dysphoria/symptomatology, some risk factors present, and identifiable protective factors, including available and accessible social  support.  PLAN OF CARE: see H&P  I certify that inpatient services furnished can reasonably be expected to improve the patient's condition.   Micheal Likenshristopher T Jaqwan Wieber, MD 11/17/2018, 10:48 AM

## 2018-11-17 NOTE — Tx Team (Addendum)
Interdisciplinary Treatment and Diagnostic Plan Update  11/17/2018 Time of Session:  Warren Salas MRN: [id number]  Principal Diagnosis: MDD (major depressive disorder), recurrent severe, without psychosis ([place])  Secondary Diagnoses: Principal Problem:   MDD (major depressive disorder), recurrent severe, without psychosis ([place]) Active Problems:   Alcohol dependence with alcohol-induced mood disorder (H[facility])   Moderate cocaine use disorder ([place])   Current Medications:  Current Facility-Administered Medications  Medication Dose Route Frequency Provider Last Rate Last Dose  . acetaminophen (TYLENOL) tablet 650 mg  650 mg Oral Q6H PRN Patriciaann Clan E, PA-C      . albuterol (PROVENTIL HFA;VENTOLIN HFA) 108 (90 Base) MCG/ACT inhaler 1-2 puff  1-2 puff Inhalation Q6H PRN Pennelope Bracken, MD      . alum & mag hydroxide-simeth (MAALOX/MYLANTA) 200-200-20 MG/5ML suspension 30 mL  30 mL Oral Q4H PRN Laverle Hobby, PA-C      . aspirin EC tablet 81 mg  81 mg Oral Daily Pennelope Bracken, MD   81 mg at 11/17/18 4680  . atorvastatin (LIPITOR) tablet 80 mg  80 mg Oral q1800 Pennelope Bracken, MD   80 mg at 11/16/18 1616  . hydrOXYzine (ATARAX/VISTARIL) tablet 50 mg  50 mg Oral Q6H PRN Pennelope Bracken, MD      . isosorbide mononitrate (IMDUR) 24 hr tablet 30 mg  30 mg Oral Daily Pennelope Bracken, MD   30 mg at 11/17/18 3212  . lisinopril (PRINIVIL,ZESTRIL) tablet 5 mg  5 mg Oral Daily Pennelope Bracken, MD   5 mg at 11/17/18 2482  . loperamide (IMODIUM) capsule 2-4 mg  2-4 mg Oral PRN Laverle Hobby, PA-C      . LORazepam (ATIVAN) tablet 1 mg  1 mg Oral Q6H PRN Laverle Hobby, PA-C      . LORazepam (ATIVAN) tablet 1 mg  1 mg Oral TID Patriciaann Clan E, PA-C   1 mg at 11/17/18 1325   Followed by  . [START ON 11/18/2018] LORazepam (ATIVAN) tablet 1 mg  1 mg Oral BID Patriciaann Clan E, PA-C       Followed by  . [START ON 11/20/2018] LORazepam (ATIVAN)  tablet 1 mg  1 mg Oral Daily Simon, Spencer E, PA-C      . magnesium hydroxide (MILK OF MAGNESIA) suspension 30 mL  30 mL Oral Daily PRN Patriciaann Clan E, PA-C      . metFORMIN (GLUCOPHAGE) tablet 500 mg  500 mg Oral BID Pennelope Bracken, MD   500 mg at 11/17/18 5003  . metoprolol tartrate (LOPRESSOR) tablet 50 mg  50 mg Oral BID Pennelope Bracken, MD   50 mg at 11/17/18 7048  . mometasone-formoterol (DULERA) 200-5 MCG/ACT inhaler 2 puff  2 puff Inhalation BID Pennelope Bracken, MD   2 puff at 11/17/18 1019  . multivitamin with minerals tablet 1 tablet  1 tablet Oral Daily Laverle Hobby, PA-C   1 tablet at 11/17/18 0834  . nicotine (NICODERM CQ - dosed in mg/24 hours) patch 21 mg  21 mg Transdermal Daily Pennelope Bracken, MD   21 mg at 11/17/18 0831  . nitroGLYCERIN (NITROSTAT) SL tablet 0.4 mg  0.4 mg Sublingual Q5 min PRN Pennelope Bracken, MD      . ondansetron (ZOFRAN-ODT) disintegrating tablet 4 mg  4 mg Oral Q6H PRN Patriciaann Clan E, PA-C      . pantoprazole (PROTONIX) EC tablet 40 mg  40 mg Oral Daily Pennelope Bracken, MD  40 mg at 11/17/18 0832  . sertraline (ZOLOFT) tablet 25 mg  25 mg Oral Daily Pennelope Bracken, MD   25 mg at 11/17/18 9357  . thiamine (B-1) injection 100 mg  100 mg Intramuscular Once Patriciaann Clan E, PA-C      . thiamine (VITAMIN B-1) tablet 100 mg  100 mg Oral Daily Patriciaann Clan E, PA-C   100 mg at 11/17/18 0177  . ticagrelor (BRILINTA) tablet 90 mg  90 mg Oral BID Pennelope Bracken, MD   90 mg at 11/17/18 9390   PTA Medications: Medications Prior to Admission  Medication Sig Dispense Refill Last Dose  . albuterol (PROVENTIL HFA;VENTOLIN HFA) 108 (90 Base) MCG/ACT inhaler Inhale 1-2 puffs into the lungs every 6 (six) hours as needed for wheezing. 1 Inhaler 0 Past Month at Unknown time  . aspirin EC 81 MG tablet Take 81 mg by mouth daily.   unk  . atorvastatin (LIPITOR) 80 MG tablet Take 1 tablet (80 mg  total) by mouth daily at 6 PM. 90 tablet 3 unk  . budesonide-formoterol (SYMBICORT) 160-4.5 MCG/ACT inhaler Inhale 2 puffs into the lungs 2 (two) times daily as needed (For shortness of breath.).    unk  . hydrOXYzine (ATARAX/VISTARIL) 25 MG tablet Take 1 tablet (25 mg total) by mouth every 6 (six) hours as needed for anxiety. 30 tablet 0   . isosorbide mononitrate (IMDUR) 30 MG 24 hr tablet Take 1 tablet (30 mg total) by mouth daily. 90 tablet 3 unk  . lisinopril (PRINIVIL,ZESTRIL) 10 MG tablet Take 0.5 tablets (5 mg total) by mouth daily. 45 tablet 3 unk  . metFORMIN (GLUCOPHAGE) 500 MG tablet Take 1 tablet (500 mg total) by mouth 2 (two) times daily. 60 tablet 0   . metoprolol tartrate (LOPRESSOR) 50 MG tablet Take 1 tablet (50 mg total) by mouth 2 (two) times daily. 180 tablet 3 unk  . nitroGLYCERIN (NITROSTAT) 0.4 MG SL tablet Place 1 tablet (0.4 mg total) under the tongue every 5 (five) minutes as needed. 25 tablet 2 unk at prn  . omeprazole (PRILOSEC) 20 MG capsule Take 1 capsule (20 mg total) by mouth daily. 14 capsule 0 unk  . sertraline (ZOLOFT) 25 MG tablet Take 1 tablet (25 mg total) by mouth daily. For mood control 30 tablet 0   . ticagrelor (BRILINTA) 90 MG TABS tablet Take 1 tablet (90 mg total) by mouth 2 (two) times daily. 180 tablet 3 unk    Patient Stressors: Financial difficulties Medication change or noncompliance Substance abuse  Patient Strengths: Ability for insight Active sense of humor Capable of independent living Communication skills  Treatment Modalities: Medication Management, Group therapy, Case management,  1 to 1 session with clinician, Psychoeducation, Recreational therapy.   Physician Treatment Plan for Primary Diagnosis: MDD (major depressive disorder), recurrent severe, without psychosis ([place]) Long Term Goal(s): Improvement in symptoms so as ready for discharge Improvement in symptoms so as ready for discharge   Short Term Goals: Ability to identify  and develop effective coping behaviors will improve Ability to demonstrate self-control will improve  Medication Management: Evaluate patient's response, side effects, and tolerance of medication regimen.  Therapeutic Interventions: 1 to 1 sessions, Unit Group sessions and Medication administration.  Evaluation of Outcomes: Not Met  Physician Treatment Plan for Secondary Diagnosis: Principal Problem:   MDD (major depressive disorder), recurrent severe, without psychosis ([place]) Active Problems:   Alcohol dependence with alcohol-induced mood disorder ([facility])   Moderate cocaine use disorder ([place])  Long Term Goal(s): Improvement in symptoms so as ready for discharge Improvement in symptoms so as ready for discharge   Short Term Goals: Ability to identify and develop effective coping behaviors will improve Ability to demonstrate self-control will improve     Medication Management: Evaluate patient's response, side effects, and tolerance of medication regimen.  Therapeutic Interventions: 1 to 1 sessions, Unit Group sessions and Medication administration.  Evaluation of Outcomes: Not Met   RN Treatment Plan for Primary Diagnosis: MDD (major depressive disorder), recurrent severe, without psychosis ([place]) Long Term Goal(s): Knowledge of disease and therapeutic regimen to maintain health will improve  Short Term Goals: Ability to participate in decision making will improve, Ability to verbalize feelings will improve, Ability to disclose and discuss suicidal ideas and Ability to identify and develop effective coping behaviors will improve  Medication Management: RN will administer medications as ordered by provider, will assess and evaluate patient's response and provide education to patient for prescribed medication. RN will report any adverse and/or side effects to prescribing provider.  Therapeutic Interventions: 1 on 1 counseling sessions, Psychoeducation, Medication administration, Evaluate  responses to treatment, Monitor vital signs and CBGs as ordered, Perform/monitor CIWA, COWS, AIMS and Fall Risk screenings as ordered, Perform wound care treatments as ordered.  Evaluation of Outcomes: Not Met   LCSW Treatment Plan for Primary Diagnosis: MDD (major depressive disorder), recurrent severe, without psychosis ([place]) Long Term Goal(s): Safe transition to appropriate next level of care at discharge, Engage patient in therapeutic group addressing interpersonal concerns.  Short Term Goals: Engage patient in aftercare planning with referrals and resources  Therapeutic Interventions: Assess for all discharge needs, 1 to 1 time with Social worker, Explore available resources and support systems, Assess for adequacy in community support network, Educate family and significant other(s) on suicide prevention, Complete Psychosocial Assessment, Interpersonal group therapy.  Evaluation of Outcomes: Not Met   Progress in Treatment: Attending groups: No. Participating in groups: No. Taking medication as prescribed: Yes. Toleration medication: Yes. Family/Significant other contact made: No, will contact:  patient declined consent for collateral Patient understands diagnosis: Yes. Discussing patient identified problems/goals with staff: Yes. Medical problems stabilized or resolved: Yes. Denies suicidal/homicidal ideation: Yes. Issues/concerns per patient self-inventory: No. Other:   New problem(s) identified: None   New Short Term/Long Term Goal(s):Detox, medication stabilization, elimination of SI thoughts, development of comprehensive mental wellness plan.    Patient Goals:  Need to keep my mind right   Discharge Plan or Barriers: CSW will assess for appropriate referrals and possible discharge planning.   Reason for Continuation of Hospitalization: Anxiety Depression Medication stabilization Suicidal ideation  Estimated Length of Stay: 3-5 days   Attendees: Patient:  11/17/2018 2:15 PM  Physician: Dr. Maris Berger, MD 11/17/2018 2:15 PM  Nursing: Clarise Cruz.Carlean Jews, RN 11/17/2018 2:15 PM  RN Care Manager: Rhunette Croft 11/17/2018 2:15 PM  Social Worker: Radonna Ricker, [place] 11/17/2018 2:15 PM  Recreational Therapist:  11/17/2018 2:15 PM  Other:  11/17/2018 2:15 PM  Other:  11/17/2018 2:15 PM  Other: 11/17/2018 2:15 PM    Scribe for Treatment Team: Marylee Floras, [place] 11/17/2018 2:15 PM

## 2018-11-17 NOTE — Progress Notes (Signed)
[facility] MD Progress Note  11/17/2018 10:31 AM Warren [place]  MRN:  [id number] Subjective:    History as per psychiatric intake: Warren Salas is a 46 M with history of MDD, alcohol use disorder, and cocaine use disorder who was admitted voluntarily from [facility] with worsening depression, SI with plan to cut himself with a knife, worsening use of alcohol, and worsening use of cocaine. Pt has recent relevant history of discharge from [facility] for similar presentation on 10/27/18 to outpatient level of care. Pt was medically cleared and then transferred to [facility] for additional treatment and stabilization. Upon initial interview, pt shares recent history after most recent discharge from [facility], stating, "Eerything started going good, but I didn't take my depression and anxiety medications, and then I basically got back to the way I was before I came in." Pt shares that he was not able to afford the $3 co-pay for his medications, so he did not fill the prescriptions for both his psychotropic and non-psychotropic medications. Pt was confronted with his report that he has been staying in a motel and paying for alcohol and cocaine, and he acknowledges this, stating, "I know better; guess I just need help." Pt reports worsening mood symptoms of depression, initial insomnia, anhedonia, guilty feelings, worthlessness, hopelessness, low energy, poor concentration, poor appetite, and psychomotor retardation. He endorses SI with plan to cut himself with a knife, and he is able to contract for safety while in the hospital. He denies HI/AH/VH. He denies symptoms of hypomania/mania, OCD, and PTSD. Her reports drinking about 12 beers per day, using cocaine about 3 days per week (via insufflation), and smoking tobacco 2ppd. He denies other illicit substance use. Discussed with patient about treatment options. He would like to resume on his previous treatment regimen of zoloft as well as resuming his medications for hx of CAD,  HLD, and DMII. He will be continued on CIWA with ativan taper. He is open to referral to residential substance use treatment, especially in context of being able to transition to stable housing in a sober living environment after completion of initial residential substance use treatment. He will work with SW team regarding this referral. Pt is in agreement with the above plan, and he had no further questions, comments, or concerns.  As per evaluation today: Today upon evaluation, pt shares, "I'm a little bit better." Pt reports some improvement of his depression symptoms and symptoms of withdrawal, but he reports he is still feeling fatigued this morning despite sleeping well. He denies any specific concerns. His appetite is good. He denies other physical complaints. He denies SI/HI/AH/VH. He is tolerating his medications well, and he is in agreement to continue his current regimen without changes. He remains in agreement for plan to be referred to residential substance use treatment. He was in agreement with the overall plan above, and he had no further questions, comments, or concerns.  Principal Problem: MDD (major depressive disorder), recurrent severe, without psychosis (HCC) Diagnosis: Principal Problem:   MDD (major depressive disorder), recurrent severe, without psychosis (HCC) Active Problems:   Alcohol dependence with alcohol-induced mood disorder (HCC)   Moderate cocaine use disorder (HCC)  Total Time spent with patient: 30 minutes  Past Psychiatric History: see H&P  Past Medical History:  Past Medical History:  Diagnosis Date  . Back pain   . COPD (chronic obstructive pulmonary disease) (HCC)   . Diabetes mellitus without complication (HCC)   . Hypertension   . STEMI (ST elevation myocardial infarction) (HCC)  10/2017    Past Surgical History:  Procedure Laterality Date  . APPENDECTOMY    . CORONARY STENT INTERVENTION N/A 12/29/2017   Procedure: CORONARY STENT INTERVENTION -  LAD;  Surgeon: Corky Crafts, MD;  Location: [facility] INVASIVE CV LAB;  Service: Cardiovascular;  Laterality: N/A;  . CORONARY/GRAFT ACUTE MI REVASCULARIZATION N/A 11/01/2017   Procedure: Coronary/Graft Acute MI Revascularization;  Surgeon: Corky Crafts, MD;  Location: [facility] INVASIVE CV LAB;  Service: Cardiovascular;  Laterality: N/A;  . LEFT HEART CATH AND CORONARY ANGIOGRAPHY N/A 11/01/2017   Procedure: LEFT HEART CATH AND CORONARY ANGIOGRAPHY;  Surgeon: Corky Crafts, MD;  Location: [facility] INVASIVE CV LAB;  Service: Cardiovascular;  Laterality: N/A;   Family History:  Family History  Problem Relation Age of Onset  . Hypertension Mother   . Diabetes Mother   . Hypertension Father   . Diabetes Father    Family Psychiatric  History: see H&P Social History:  Social History   Substance and Sexual Activity  Alcohol Use Yes  . Alcohol/week: 12.0 standard drinks  . Types: 12 Cans of beer per week     Social History   Substance and Sexual Activity  Drug Use Yes  . Types: Cocaine    Social History   Socioeconomic History  . Marital status: Divorced    Spouse name: Not on file  . Number of children: 3  . Years of education: Not on file  . Highest education level: Not on file  Occupational History  . Not on file  Social Needs  . Financial resource strain: Not on file  . Food insecurity:    Worry: Not on file    Inability: Not on file  . Transportation needs:    Medical: Not on file    Non-medical: Not on file  Tobacco Use  . Smoking status: Current Every Day Smoker    Packs/day: 1.00    Types: Cigarettes  . Smokeless tobacco: Never Used  Substance and Sexual Activity  . Alcohol use: Yes    Alcohol/week: 12.0 standard drinks    Types: 12 Cans of beer per week  . Drug use: Yes    Types: Cocaine  . Sexual activity: Never  Lifestyle  . Physical activity:    Days per week: Not on file    Minutes per session: Not on file  . Stress: Not on file  Relationships   . Social connections:    Talks on phone: Not on file    Gets together: Not on file    Attends religious service: Not on file    Active member of club or organization: Not on file    Attends meetings of clubs or organizations: Not on file    Relationship status: Not on file  Other Topics Concern  . Not on file  Social History Narrative  . Not on file   Additional Social History:    Pain Medications: See d/c med list from [facility] Prescriptions: See d/c med list from [facility] Over the Counter: see d/c med list from [facility] History of alcohol / drug use?: Yes Withdrawal Symptoms: Sweats, Nausea / Vomiting, Tremors, Fever / Chills, Diarrhea, Patient aware of relationship between substance abuse and physical/medical complications Name of Substance 1: ETOH (beer) 1 - Age of First Use: Teens 1 - Amount (size/oz): 12 pack  1 - Frequency: daily 1 - Duration: on-going 1 - Last Use / Amount: 11/15/18 Name of Substance 2: Cocaine 2 - Age of First Use: Unknown 2 -  Amount (size/oz): Varies 2 - Frequency: Varies 2 - Duration: off and on 2 - Last Use / Amount: Pt can't recall                Sleep: Good  Appetite:  Good  Current Medications: Current Facility-Administered Medications  Medication Dose Route Frequency Provider Last Rate Last Dose  . acetaminophen (TYLENOL) tablet 650 mg  650 mg Oral Q6H PRN Donell Sievert E, PA-C      . albuterol (PROVENTIL HFA;VENTOLIN HFA) 108 (90 Base) MCG/ACT inhaler 1-2 puff  1-2 puff Inhalation Q6H PRN Micheal Likens, MD      . alum & mag hydroxide-simeth (MAALOX/MYLANTA) 200-200-20 MG/5ML suspension 30 mL  30 mL Oral Q4H PRN Kerry Hough, PA-C      . aspirin EC tablet 81 mg  81 mg Oral Daily Micheal Likens, MD   81 mg at 11/17/18 1610  . atorvastatin (LIPITOR) tablet 80 mg  80 mg Oral q1800 Micheal Likens, MD   80 mg at 11/16/18 1616  . hydrOXYzine (ATARAX/VISTARIL) tablet 50 mg  50 mg Oral Q6H PRN Micheal Likens,  MD      . isosorbide mononitrate (IMDUR) 24 hr tablet 30 mg  30 mg Oral Daily Micheal Likens, MD   30 mg at 11/17/18 9604  . lisinopril (PRINIVIL,ZESTRIL) tablet 5 mg  5 mg Oral Daily Micheal Likens, MD   5 mg at 11/17/18 5409  . loperamide (IMODIUM) capsule 2-4 mg  2-4 mg Oral PRN Kerry Hough, PA-C      . LORazepam (ATIVAN) tablet 1 mg  1 mg Oral Q6H PRN Kerry Hough, PA-C      . LORazepam (ATIVAN) tablet 1 mg  1 mg Oral TID Kerry Hough, PA-C       Followed by  . [START ON 11/18/2018] LORazepam (ATIVAN) tablet 1 mg  1 mg Oral BID Donell Sievert E, PA-C       Followed by  . [START ON 11/20/2018] LORazepam (ATIVAN) tablet 1 mg  1 mg Oral Daily Simon, Spencer E, PA-C      . magnesium hydroxide (MILK OF MAGNESIA) suspension 30 mL  30 mL Oral Daily PRN Donell Sievert E, PA-C      . metFORMIN (GLUCOPHAGE) tablet 500 mg  500 mg Oral BID Micheal Likens, MD   500 mg at 11/17/18 8119  . metoprolol tartrate (LOPRESSOR) tablet 50 mg  50 mg Oral BID Micheal Likens, MD   50 mg at 11/17/18 1478  . mometasone-formoterol (DULERA) 200-5 MCG/ACT inhaler 2 puff  2 puff Inhalation BID Micheal Likens, MD   2 puff at 11/17/18 1019  . multivitamin with minerals tablet 1 tablet  1 tablet Oral Daily Kerry Hough, PA-C   1 tablet at 11/17/18 0834  . nicotine (NICODERM CQ - dosed in mg/24 hours) patch 21 mg  21 mg Transdermal Daily Micheal Likens, MD   21 mg at 11/17/18 0831  . nitroGLYCERIN (NITROSTAT) SL tablet 0.4 mg  0.4 mg Sublingual Q5 min PRN Micheal Likens, MD      . ondansetron (ZOFRAN-ODT) disintegrating tablet 4 mg  4 mg Oral Q6H PRN Donell Sievert E, PA-C      . pantoprazole (PROTONIX) EC tablet 40 mg  40 mg Oral Daily Micheal Likens, MD   40 mg at 11/17/18 2956  . sertraline (ZOLOFT) tablet 25 mg  25 mg Oral Daily Micheal Likens, MD  25 mg at 11/17/18 0832  . thiamine (B-1) injection 100 mg  100 mg  Intramuscular Once Donell Sievert E, PA-C      . thiamine (VITAMIN B-1) tablet 100 mg  100 mg Oral Daily Donell Sievert E, PA-C   100 mg at 11/17/18 1610  . ticagrelor (BRILINTA) tablet 90 mg  90 mg Oral BID Micheal Likens, MD   90 mg at 11/17/18 9604    Lab Results:  Results for orders placed or performed during the hospital encounter of 11/16/18 (from the past 48 hour(s))  TSH     Status: None   Collection Time: 11/17/18  6:29 AM  Result Value Ref Range   TSH 1.963 0.350 - 4.500 uIU/mL    Comment: Performed by a 3rd Generation assay with a functional sensitivity of <=0.01 uIU/mL. Performed at [facility], [address] [place], [place] [address]   Lipid panel     Status: None   Collection Time: 11/17/18  6:29 AM  Result Value Ref Range   Cholesterol 109 0 - 200 mg/dL   Triglycerides 89 <119 mg/dL   HDL 60 >14 mg/dL   Total CHOL/HDL Ratio 1.8 RATIO   VLDL 18 0 - 40 mg/dL   LDL Cholesterol 31 0 - 99 mg/dL    Comment:        Total Cholesterol/HDL:CHD Risk Coronary Heart Disease Risk Table                     Men   Women  1/2 Average Risk   3.4   3.3  Average Risk       5.0   4.4  2 X Average Risk   9.6   7.1  3 X Average Risk  23.4   11.0        Use the calculated Patient Ratio above and the CHD Risk Table to determine the patient's CHD Risk.        ATP III CLASSIFICATION (LDL):  <100     mg/dL   Optimal  782-956  mg/dL   Near or Above                    Optimal  130-159  mg/dL   Borderline  213-086  mg/dL   High  >578     mg/dL   Very High Performed at [facility], [address], [place], [place] [address]     Blood Alcohol level:  Lab Results  Component Value Date   ETH 251 (H) 10/24/2018   ETH 198 (H) 10/22/2018    Metabolic Disorder Labs: Lab Results  Component Value Date   HGBA1C 6.1 (H) 11/01/2017   MPG 128.37 11/01/2017   No results found for: PROLACTIN Lab Results  Component Value Date   CHOL  109 11/17/2018   TRIG 89 11/17/2018   HDL 60 11/17/2018   CHOLHDL 1.8 11/17/2018   VLDL 18 11/17/2018   LDLCALC 31 11/17/2018   LDLCALC 48 11/01/2017    Physical Findings: AIMS: Facial and Oral Movements Muscles of Facial Expression: None, normal Lips and Perioral Area: None, normal Jaw: None, normal Tongue: None, normal,Extremity Movements Upper (arms, wrists, hands, fingers): None, normal Lower (legs, knees, ankles, toes): None, normal, Trunk Movements Neck, shoulders, hips: None, normal, Overall Severity Severity of abnormal movements (highest score from questions above): None, normal Incapacitation due to abnormal movements: None, normal Patient's awareness of abnormal movements (rate only patient's report): No Awareness,  Dental Status Current problems with teeth and/or dentures?: No Does patient usually wear dentures?: No  CIWA:  CIWA-Ar Total: 1 COWS:  COWS Total Score: 5  Musculoskeletal: Strength & Muscle Tone: within normal limits Gait & Station: normal Patient leans: N/A  Psychiatric Specialty Exam: Physical Exam  Nursing note and vitals reviewed.   Review of Systems  Constitutional: Positive for malaise/fatigue. Negative for chills and fever.  Respiratory: Negative for cough and shortness of breath.   Cardiovascular: Negative for chest pain.  Gastrointestinal: Negative for abdominal pain, heartburn, nausea and vomiting.  Psychiatric/Behavioral: Positive for depression. Negative for hallucinations and suicidal ideas. The patient is nervous/anxious. The patient does not have insomnia.     Blood pressure 139/82, pulse 91, temperature 98.2 F (36.8 C), temperature source Oral, resp. rate 18, height 5' 4.5" (1.638 m), weight 69.4 kg, SpO2 99 %.Body mass index is 25.86 kg/m.  General Appearance: Casual and Fairly Groomed  Eye Contact:  Good  Speech:  Clear and Coherent and Normal Rate  Volume:  Normal  Mood:  Anxious and Depressed  Affect:  Appropriate and  Congruent  Thought Process:  Coherent and Goal Directed  Orientation:  Full (Time, Place, and Person)  Thought Content:  Logical  Suicidal Thoughts:  No  Homicidal Thoughts:  No  Memory:  Immediate;   Fair Recent;   Fair Remote;   Fair  Judgement:  Fair  Insight:  Fair  Psychomotor Activity:  Normal  Concentration:  Concentration: Fair  Recall:  FiservFair  Fund of Knowledge:  Fair  Language:  Fair  Akathisia:  No  Handed:    AIMS (if indicated):     Assets:  Resilience Social Support  ADL's:  Intact  Cognition:  WNL  Sleep:  Number of Hours: 6.5   Treatment Plan Summary: Daily contact with patient to assess and evaluate symptoms and progress in treatment and Medication management   -Continue inpatient hospitalization  -MDD, recurrent, severe, without psychosis   -Continue zoloft 25mg  po qDay  -Alcohol use disorder (withdrawal)  -Continue CIWA with ativan taper and ativan 1mg  po q6h prn CIWA >10  -HTN   -Continue lisinopril 5mg  po qDay   -Continue Imdur 30mg  po qDay   -Continue lopressor 50mg  po BID  -HLD  -Continue lipitor 80mg  po qDay  -DMII   -Continue metformin 500mg  po BID  -COPD  -Continue dulera 200-5 mcg/act take 2 puffs inhaled BID   -Continue alubterol 108 mcg/act take 1-2 puffs inhaled q6h prn SOB/wheeze  -Hx of CAD/MI   -Continue aspirin 81mg  po qDay   -Continue nitrostat 0.4mg  SL q225min PRN chest pain   -Continue brilinta 90mg  po BID  -anxiety  -Continue vistaril 50mg  po q6h prn anxiety  -GERD  -Continue protonix 40mg  po qDay  -Encourage participation in groups and therapeutic milieu  -disposition planning will be ongoing  Micheal Likenshristopher T Hanah Moultry, MD 11/17/2018, 10:31 AM

## 2018-11-17 NOTE — Progress Notes (Signed)
Recreation Therapy Notes  Date: 11/17/18 Time: 0930 Location: [address] Dayroom  Group Topic: Stress Management  Goal Area(s) Addresses:  Patient will verbalize importance of using healthy stress management.  Patient will identify positive emotions associated with healthy stress management.   Behavioral Response: Engaged  Intervention: Stress Management  Activity :  Meditation.  LRT introduced the stress management technique of meditation to the patients.  LRT played a meditation that focused on being resilient in the face of challenge.  Patients were to listen and follow along as the meditation was played to engage in activity.  Education:  Stress Management, Discharge Planning.   Education Outcome: Acknowledges edcuation/In group clarification offered/Needs additional education  Clinical Observations/Feedback: Pt attended and participated in activity.     Caroll RancherMarjette Faye Sanfilippo, LRT/CTRS         Caroll RancherLindsay, Rahima Fleishman A 11/17/2018 12:35 PM

## 2018-11-18 DIAGNOSIS — F1490 Cocaine use, unspecified, uncomplicated: Secondary | ICD-10-CM

## 2018-11-18 DIAGNOSIS — F419 Anxiety disorder, unspecified: Secondary | ICD-10-CM

## 2018-11-18 MED ORDER — SERTRALINE HCL 50 MG PO TABS
50.0000 mg | ORAL_TABLET | Freq: Every day | ORAL | Status: DC
  Administered 2018-11-19 – 2018-11-20 (×2): 50 mg via ORAL
  Filled 2018-11-18 (×4): qty 1

## 2018-11-18 NOTE — Progress Notes (Signed)
D:  Warren [place]as was in the bed again this evening.  He did not attend AA group after much encouragement.  He denied SI/HI or A/V hallucinations.  He denied withdrawal symptoms but was having considerable cravings to "have a drink tonight."  He does report feeling groggy and depressed.  BP stable this evening.  He declined needing any hs medications this evening.  He is currently resting with his eyes closed and appears to be asleep. A:  1:1 with RN for support and encouragement.  Medications as ordered.  Q 15 minute checks maintained for safety.  Encouraged participation in group and unit activities.   R:  Warren [place]as remains safe on the unit.  We will continue to monitor the progress towards his goals.

## 2018-11-18 NOTE — [facility] Group Notes (Signed)
[facility] LCSW Group Therapy Note  Date/Time: 11/18/18, 1000  Type of Therapy/Topic:  Group Therapy:  Balance in Life  Participation Level:  Did not attend  Description of Group:    This group will address the concept of balance and how it feels and looks when one is unbalanced. Patients will be encouraged to process areas in their lives that are out of balance, and identify reasons for remaining unbalanced. Facilitators will guide patients utilizing problem- solving interventions to address and correct the stressor making their life unbalanced. Understanding and applying boundaries will be explored and addressed for obtaining  and maintaining a balanced life. Patients will be encouraged to explore ways to assertively make their unbalanced needs known to significant others in their lives, using other group members and facilitator for support and feedback.  Therapeutic Goals: 1. Patient will identify two or more emotions or situations they have that consume much of in their lives. 2. Patient will identify signs/triggers that life has become out of balance:  3. Patient will identify two ways to set boundaries in order to achieve balance in their lives:  4. Patient will demonstrate ability to communicate their needs through discussion and/or role plays  Summary of Patient Progress:          Therapeutic Modalities:   Cognitive Behavioral Therapy Solution-Focused Therapy Assertiveness Training  Daleen SquibbGreg Roniqua Kintz, LCSW

## 2018-11-18 NOTE — Progress Notes (Signed)
[facility] MD Progress Note  11/18/2018 11:13 AM Warren Salas  MRN:  [id number] Subjective:    Evaluation: Warren Salas seen standing at the nursing station waiting for medication.  Patient presents irritable flat and guarded.  Reports "This places is pissing  me off" as patient reports he is agitated due to being awakened for medications administration.  Continues to rate his depression 8 out of 10 with 10 being the worst however,  denies suicidal or homicidal ideations.  Denies auditory visual hallucinations.  Discussed increasing Zoloft from 25 mg to 50 mg patient was agreeable to plan.  Patient was positive for short and abrupt due to morning aggravations.  Support, encouragement reassurance was provided  History: Per assessment note:Warren Salas is a 30 M with history of MDD, alcohol use disorder, and cocaine use disorder who was admitted voluntarily from [facility] with worsening depression, SI with plan to cut himself with a knife, worsening use of alcohol, and worsening use of cocaine. Pt has recent relevant history of discharge from [facility] for similar presentation on 10/27/18 to outpatient level of care. Pt was medically cleared and then transferred to [facility] for additional treatment and stabilization.   Principal Problem: MDD (major depressive disorder), recurrent severe, without psychosis (HCC) Diagnosis: Principal Problem:   MDD (major depressive disorder), recurrent severe, without psychosis (HCC) Active Problems:   Alcohol dependence with alcohol-induced mood disorder (HCC)   Moderate cocaine use disorder (HCC)  Total Time spent with patient: 30 minutes  Past Psychiatric History: see H&P  Past Medical History:  Past Medical History:  Diagnosis Date  . Back pain   . COPD (chronic obstructive pulmonary disease) (HCC)   . Diabetes mellitus without complication (HCC)   . Hypertension   . STEMI (ST elevation myocardial infarction) (HCC) 10/2017    Past Surgical History:  Procedure  Laterality Date  . APPENDECTOMY    . CORONARY STENT INTERVENTION N/A 12/29/2017   Procedure: CORONARY STENT INTERVENTION - LAD;  Surgeon: Corky Crafts, MD;  Location: [facility] INVASIVE CV LAB;  Service: Cardiovascular;  Laterality: N/A;  . CORONARY/GRAFT ACUTE MI REVASCULARIZATION N/A 11/01/2017   Procedure: Coronary/Graft Acute MI Revascularization;  Surgeon: Corky Crafts, MD;  Location: [facility] INVASIVE CV LAB;  Service: Cardiovascular;  Laterality: N/A;  . LEFT HEART CATH AND CORONARY ANGIOGRAPHY N/A 11/01/2017   Procedure: LEFT HEART CATH AND CORONARY ANGIOGRAPHY;  Surgeon: Corky Crafts, MD;  Location: [facility] INVASIVE CV LAB;  Service: Cardiovascular;  Laterality: N/A;   Family History:  Family History  Problem Relation Age of Onset  . Hypertension Mother   . Diabetes Mother   . Hypertension Father   . Diabetes Father    Family Psychiatric  History: see H&P Social History:  Social History   Substance and Sexual Activity  Alcohol Use Yes  . Alcohol/week: 12.0 standard drinks  . Types: 12 Cans of beer per week     Social History   Substance and Sexual Activity  Drug Use Yes  . Types: Cocaine    Social History   Socioeconomic History  . Marital status: Divorced    Spouse name: Not on file  . Number of children: 3  . Years of education: Not on file  . Highest education level: Not on file  Occupational History  . Not on file  Social Needs  . Financial resource strain: Not on file  . Food insecurity:    Worry: Not on file    Inability: Not on file  . Transportation needs:  Medical: Not on file    Non-medical: Not on file  Tobacco Use  . Smoking status: Current Every Day Smoker    Packs/day: 1.00    Types: Cigarettes  . Smokeless tobacco: Never Used  Substance and Sexual Activity  . Alcohol use: Yes    Alcohol/week: 12.0 standard drinks    Types: 12 Cans of beer per week  . Drug use: Yes    Types: Cocaine  . Sexual activity: Never  Lifestyle  .  Physical activity:    Days per week: Not on file    Minutes per session: Not on file  . Stress: Not on file  Relationships  . Social connections:    Talks on phone: Not on file    Gets together: Not on file    Attends religious service: Not on file    Active member of club or organization: Not on file    Attends meetings of clubs or organizations: Not on file    Relationship status: Not on file  Other Topics Concern  . Not on file  Social History Narrative  . Not on file   Additional Social History:    Pain Medications: See d/c med list from [facility] Prescriptions: See d/c med list from [facility] Over the Counter: see d/c med list from [facility] History of alcohol / drug use?: Yes Withdrawal Symptoms: Sweats, Nausea / Vomiting, Tremors, Fever / Chills, Diarrhea, Patient aware of relationship between substance abuse and physical/medical complications Name of Substance 1: ETOH (beer) 1 - Age of First Use: Teens 1 - Amount (size/oz): 12 pack  1 - Frequency: daily 1 - Duration: on-going 1 - Last Use / Amount: 11/15/18 Name of Substance 2: Cocaine 2 - Age of First Use: Unknown 2 - Amount (size/oz): Varies 2 - Frequency: Varies 2 - Duration: off and on 2 - Last Use / Amount: Pt can't recall                Sleep: Good  Appetite:  Good  Current Medications: Current Facility-Administered Medications  Medication Dose Route Frequency Provider Last Rate Last Dose  . acetaminophen (TYLENOL) tablet 650 mg  650 mg Oral Q6H PRN Donell Sievert E, PA-C      . albuterol (PROVENTIL HFA;VENTOLIN HFA) 108 (90 Base) MCG/ACT inhaler 1-2 puff  1-2 puff Inhalation Q6H PRN Micheal Likens, MD      . alum & mag hydroxide-simeth (MAALOX/MYLANTA) 200-200-20 MG/5ML suspension 30 mL  30 mL Oral Q4H PRN Kerry Hough, PA-C      . aspirin EC tablet 81 mg  81 mg Oral Daily Micheal Likens, MD   81 mg at 11/18/18 4098  . atorvastatin (LIPITOR) tablet 80 mg  80 mg Oral q1800 Micheal Likens, MD   80 mg at 11/17/18 1655  . hydrOXYzine (ATARAX/VISTARIL) tablet 50 mg  50 mg Oral Q6H PRN Micheal Likens, MD      . isosorbide mononitrate (IMDUR) 24 hr tablet 30 mg  30 mg Oral Daily Micheal Likens, MD   30 mg at 11/18/18 1191  . lisinopril (PRINIVIL,ZESTRIL) tablet 5 mg  5 mg Oral Daily Micheal Likens, MD   5 mg at 11/18/18 0838  . loperamide (IMODIUM) capsule 2-4 mg  2-4 mg Oral PRN Kerry Hough, PA-C      . LORazepam (ATIVAN) tablet 1 mg  1 mg Oral Q6H PRN Kerry Hough, PA-C      . LORazepam (ATIVAN) tablet 1 mg  1 mg Oral BID Donell Sievert E, PA-C       Followed by  . [START ON 11/20/2018] LORazepam (ATIVAN) tablet 1 mg  1 mg Oral Daily Simon, Spencer E, PA-C      . magnesium hydroxide (MILK OF MAGNESIA) suspension 30 mL  30 mL Oral Daily PRN Donell Sievert E, PA-C      . metFORMIN (GLUCOPHAGE) tablet 500 mg  500 mg Oral BID Micheal Likens, MD   500 mg at 11/18/18 0839  . metoprolol tartrate (LOPRESSOR) tablet 50 mg  50 mg Oral BID Micheal Likens, MD   50 mg at 11/18/18 1610  . mometasone-formoterol (DULERA) 200-5 MCG/ACT inhaler 2 puff  2 puff Inhalation BID Micheal Likens, MD   2 puff at 11/18/18 0840  . multivitamin with minerals tablet 1 tablet  1 tablet Oral Daily Kerry Hough, PA-C   1 tablet at 11/18/18 9604  . nicotine (NICODERM CQ - dosed in mg/24 hours) patch 21 mg  21 mg Transdermal Daily Micheal Likens, MD   21 mg at 11/18/18 0841  . nitroGLYCERIN (NITROSTAT) SL tablet 0.4 mg  0.4 mg Sublingual Q5 min PRN Micheal Likens, MD      . ondansetron (ZOFRAN-ODT) disintegrating tablet 4 mg  4 mg Oral Q6H PRN Donell Sievert E, PA-C      . pantoprazole (PROTONIX) EC tablet 40 mg  40 mg Oral Daily Micheal Likens, MD   40 mg at 11/18/18 0838  . [START ON 11/19/2018] sertraline (ZOLOFT) tablet 50 mg  50 mg Oral Daily Oneta Rack, NP      . thiamine (B-1) injection 100  mg  100 mg Intramuscular Once Donell Sievert E, PA-C      . thiamine (VITAMIN B-1) tablet 100 mg  100 mg Oral Daily Donell Sievert E, PA-C   100 mg at 11/18/18 0840  . ticagrelor (BRILINTA) tablet 90 mg  90 mg Oral BID Micheal Likens, MD   90 mg at 11/18/18 5409    Lab Results:  Results for orders placed or performed during the hospital encounter of 11/16/18 (from the past 48 hour(s))  TSH     Status: None   Collection Time: 11/17/18  6:29 AM  Result Value Ref Range   TSH 1.963 0.350 - 4.500 uIU/mL    Comment: Performed by a 3rd Generation assay with a functional sensitivity of <=0.01 uIU/mL. Performed at [facility], [address] [place], [place] [address]   Hemoglobin A1c     Status: None   Collection Time: 11/17/18  6:29 AM  Result Value Ref Range   Hgb A1c MFr Bld 5.2 4.8 - 5.6 %    Comment: (NOTE) Pre diabetes:          5.7%-6.4% Diabetes:              >6.4% Glycemic control for   <7.0% adults with diabetes    Mean Plasma Glucose 102.54 mg/dL    Comment: Performed at [facility], [address], [place], [place] [address]  Lipid panel     Status: None   Collection Time: 11/17/18  6:29 AM  Result Value Ref Range   Cholesterol 109 0 - 200 mg/dL   Triglycerides 89 <562 mg/dL   HDL 60 >13 mg/dL   Total CHOL/HDL Ratio 1.8 RATIO   VLDL 18 0 - 40 mg/dL   LDL Cholesterol 31 0 - 99 mg/dL    Comment:  Total Cholesterol/HDL:CHD Risk Coronary Heart Disease Risk Table                     Men   Women  1/2 Average Risk   3.4   3.3  Average Risk       5.0   4.4  2 X Average Risk   9.6   7.1  3 X Average Risk  23.4   11.0        Use the calculated Patient Ratio above and the CHD Risk Table to determine the patient's CHD Risk.        ATP III CLASSIFICATION (LDL):  <100     mg/dL   Optimal  409-811  mg/dL   Near or Above                    Optimal  130-159  mg/dL   Borderline  914-782  mg/dL   High  >956     mg/dL   Very  High Performed at [facility], [address] [place], [place] [address]     Blood Alcohol level:  Lab Results  Component Value Date   ETH 251 (H) 10/24/2018   ETH 198 (H) 10/22/2018    Metabolic Disorder Labs: Lab Results  Component Value Date   HGBA1C 5.2 11/17/2018   MPG 102.54 11/17/2018   MPG 128.37 11/01/2017   No results found for: PROLACTIN Lab Results  Component Value Date   CHOL 109 11/17/2018   TRIG 89 11/17/2018   HDL 60 11/17/2018   CHOLHDL 1.8 11/17/2018   VLDL 18 11/17/2018   LDLCALC 31 11/17/2018   LDLCALC 48 11/01/2017    Physical Findings: AIMS: Facial and Oral Movements Muscles of Facial Expression: None, normal Lips and Perioral Area: None, normal Jaw: None, normal Tongue: None, normal,Extremity Movements Upper (arms, wrists, hands, fingers): None, normal Lower (legs, knees, ankles, toes): None, normal, Trunk Movements Neck, shoulders, hips: None, normal, Overall Severity Severity of abnormal movements (highest score from questions above): None, normal Incapacitation due to abnormal movements: None, normal Patient's awareness of abnormal movements (rate only patient's report): No Awareness, Dental Status Current problems with teeth and/or dentures?: No Does patient usually wear dentures?: No  CIWA:  CIWA-Ar Total: 0 COWS:  COWS Total Score: 5  Musculoskeletal: Strength & Muscle Tone: within normal limits Gait & Station: normal Patient leans: N/A  Psychiatric Specialty Exam: Physical Exam  Nursing note and vitals reviewed. Cardiovascular: Normal rate.  Psychiatric: He has a normal mood and affect. His behavior is normal.    Review of Systems  Psychiatric/Behavioral: Positive for depression. Negative for hallucinations and suicidal ideas. The patient is nervous/anxious. The patient does not have insomnia.   All other systems reviewed and are negative.   Blood pressure 131/88, pulse 81, temperature 98.2 F (36.8 C),  resp. rate 18, height 5' 4.5" (1.638 m), weight 69.4 kg, SpO2 99 %.Body mass index is 25.86 kg/m.  General Appearance: Casual and Paper scrubs, irritable, flat, guarded  Eye Contact:  Good  Speech:  Clear and Coherent and Normal Rate  Volume:  Normal  Mood:  Anxious and Depressed  Affect:  Congruent, Depressed and Flat  Thought Process:  Coherent and Goal Directed  Orientation:  Full (Time, Place, and Person)  Thought Content:  Logical  Suicidal Thoughts:  No  Homicidal Thoughts:  No  Memory:  Immediate;   Fair Recent;   Fair Remote;   Fair  Judgement:  Fair  Insight:  Fair  Psychomotor Activity:  Normal  Concentration:  Concentration: Fair  Recall:  FiservFair  Fund of Knowledge:  Fair  Language:  Fair  Akathisia:  No  Handed:    AIMS (if indicated):     Assets:  Resilience Social Support  ADL's:  Intact  Cognition:  WNL  Sleep:  Number of Hours: 6   Treatment Plan Summary: Daily contact with patient to assess and evaluate symptoms and progress in treatment and Medication management   Continue with current treatment plan on 11/18/2018 as listed below except were noted  -MDD, recurrent, severe, without psychosis   -Increased Zoloft 25 mg to 50 mg  po qDay  -Alcohol use disorder (withdrawal)  -Continue CIWA with ativan taper and ativan 1mg  po q6h prn CIWA >10  -HTN   -Continue lisinopril 5mg  po qDay   -Continue Imdur 30mg  po qDay   -Continue lopressor 50mg  po BID  -HLD  -Continue lipitor 80mg  po qDay  -DMII   -Continue metformin 500mg  po BID  -COPD  -Continue dulera 200-5 mcg/act take 2 puffs inhaled BID   -Continue alubterol 108 mcg/act take 1-2 puffs inhaled q6h prn SOB/wheeze  -Hx of CAD/MI   -Continue aspirin 81mg  po qDay   -Continue nitrostat 0.4mg  SL q515min PRN chest pain   -Continue brilinta 90mg  po BID  -anxiety  -Continue vistaril 50mg  po q6h prn anxiety  -GERD  -Continue protonix 40mg  po qDay  -Encourage participation in groups and therapeutic  milieu -disposition planning will be ongoing  Oneta Rackanika N Lewis, NP 11/18/2018, 11:13 AM

## 2018-11-18 NOTE — Plan of Care (Signed)
  Problem: Safety: Goal: Periods of time without injury will increase Outcome: Progressing   

## 2018-11-18 NOTE — Progress Notes (Signed)
[facility] Group Notes:  (Nursing/MHT/Case Management/Adjunct)  Date:  11/18/2018  Time: 1300 Type of Therapy:  Nurse Education-Patricia Duke, RN.  Participation Level:  Active  Participation Quality:  Appropriate  Affect:  Appropriate  Cognitive:  Appropriate  Insight:  Appropriate  Engagement in Group:  Engaged  Modes of Intervention:  Discussion and Education   

## 2018-11-18 NOTE — Progress Notes (Signed)
Psychoeducational Group Note  Date:  11/18/2018 Time:  2337  Group Topic/Focus:  Wrap-Up Group:   The focus of this group is to help patients review their daily goal of treatment and discuss progress on daily workbooks.  Participation Level: Did Not Attend  Participation Quality:  Not Applicable  Affect:  Not Applicable  Cognitive:  Not Applicable  Insight:  Not Applicable  Engagement in Group: Not Applicable  Additional Comments:  The patient refused to attend group and returned to his room to go to sleep.   Hazle CocaGOODMAN, Golda Zavalza S 11/18/2018, 11:37 PM

## 2018-11-18 NOTE — Progress Notes (Signed)
Pt presents with a flat affect and labile mood. Pt noted to be irritable this morning during shift assessment. Pt voiced that he was frustrated for having to wake up and take his  meds. Pt reported feeling increasingly depressed and anxious this am. Pt denies SI/HI. Pt denies AVH. Pt denies any withdrawal symptoms. Pt noted to be guarded and have minimal interaction on the unit.   Medications administered as ordered per MD. Verbal support provided. Pt encouraged to attend groups. 15 minute checks performed for safety.   Pt compliant tx plan.

## 2018-11-19 NOTE — Progress Notes (Addendum)
[facility] Progress Note  11/19/2018 11:55 AM Warren Salas  MRN:  [id number] Subjective:    Evaluation: Warren Salas seen resting in bed.  Continues to present with mood irritability.  Reports worsening depression for staff interrupted his sleep. Patient  rates his depression 8 out of 10 with 10 being the worst.  Reports taking his medications as prescribed and tolerating them well.  Denies homicidal or suicidal ideations.  Denies auditory or visual hallucinations.  Patient was encouraged to attend daily group session.  CSW to continue working on residential treatment referral. Patient reported " I am going to start making phone calls I guess.'  Support encouragement reassurance was provided.  History: Per assessment note:Warren Salas is a [age] M with history of MDD, alcohol use disorder, and cocaine use disorder who [facility] with worsening depression, SI with plan to cut himself with a knife, worsening use of alcohol, and worsening use of cocaine. Pt has [facility] to outpatient level of care. Pt was medically cleared and [facility] and [facility].   Principal Problem: MDD (major depressive disorder), recurrent severe, without psychosis (HCC) Diagnosis: Principal Problem:   MDD (major depressive disorder), recurrent severe, without psychosis (HCC) Active Problems:   Alcohol dependence with alcohol-induced mood disorder (HCC)   Moderate cocaine use disorder (HCC)  Total Time spent with patient: 30 minutes  Past Psychiatric History: see H&P  Past Medical History:  Past Medical History:  Diagnosis Date  . Back pain   . COPD (chronic obstructive pulmonary disease) (HCC)   . Diabetes mellitus without complication (HCC)   . Hypertension   . STEMI (ST elevation myocardial infarction) (HCC) 10/2017    Past Surgical History:  Procedure Laterality Date   . APPENDECTOMY    . CORONARY STENT INTERVENTION N/A 12/29/2017   Procedure: CORONARY STENT INTERVENTION - LAD;  Surgeon: Corky CraftsVaranasi, Jayadeep S, MD;  Location: [facility] INVASIVE CV LAB;  Service: Cardiovascular;  Laterality: N/A;  . CORONARY/GRAFT ACUTE MI REVASCULARIZATION N/A 11/01/2017   Procedure: Coronary/Graft Acute MI Revascularization;  Surgeon: Corky CraftsVaranasi, Jayadeep S, MD;  [facility];  Service: Cardiovascular;  Laterality: N/A;  . LEFT HEART CATH AND CORONARY ANGIOGRAPHY N/A 11/01/2017   Procedure: LEFT HEART CATH AND CORONARY ANGIOGRAPHY;  Surgeon: Corky CraftsVaranasi, Jayadeep S, MD;  [facility];  Service: Cardiovascular;  Laterality: N/A;   Family History:  Family History  Problem Relation Age of Onset  . Hypertension Mother   . Diabetes Mother   . Hypertension Father   . Diabetes Father    Family Psychiatric  History: see H&P Social History:  Social History   Substance and Sexual Activity  Alcohol Use Yes  . Alcohol/week: 12.0 standard drinks  . Types: 12 Cans of beer per week     Social History   Substance and Sexual Activity  Drug Use Yes  . Types: Cocaine    Social History   Socioeconomic History  . Marital status: Divorced    Spouse name: Not on file  . Number of children: 3  . Years of education: Not on file  . Highest education level: Not on file  Occupational History  . Not on file  Social Needs  . Financial resource strain: Not on file  . Food insecurity:    Worry: Not on file    Inability: Not on file  . Transportation needs:    Medical:  Not on file    Non-medical: Not on file  Tobacco Use  . Smoking status: Current Every Day Smoker    Packs/day: 1.00    Types: Cigarettes  . Smokeless tobacco: Never Used  Substance and Sexual Activity  . Alcohol use: Yes    Alcohol/week: 12.0 standard drinks    Types: 12 Cans of beer per week  . Drug use: Yes    Types: Cocaine  . Sexual activity: Never  Lifestyle  . Physical activity:     Days per week: Not on file    Minutes per session: Not on file  . Stress: Not on file  Relationships  . Social connections:    Talks on phone: Not on file    Gets together: Not on file    Attends religious service: Not on file    Active member of club or organization: Not on file    Attends meetings of clubs or organizations: Not on file    Relationship status: Not on file  Other Topics Concern  . Not on file  Social History Narrative  . Not on file   Additional Social History:    Pain Medications: See d/c med list from [facility] Prescriptions: See d/c med list from [facility] Over the Counter: see d/c med list from [facility] History of alcohol / drug use?: Yes Withdrawal Symptoms: Sweats, Nausea / Vomiting, Tremors, Fever / Chills, Diarrhea, Patient aware of relationship between substance abuse and physical/medical complications Name of Substance 1: ETOH (beer) 1 - Age of First Use: Teens 1 - Amount (size/oz): 12 pack  1 - Frequency: daily 1 - Duration: on-going 1 - Last Use / Amount: 11/15/18 Name of Substance 2: Cocaine 2 - Age of First Use: Unknown 2 - Amount (size/oz): Varies 2 - Frequency: Varies 2 - Duration: off and on 2 - Last Use / Amount: Pt can't recall                Sleep: Good  Appetite:  Good  Current Medications: Current Facility-Administered Medications  Medication Dose Route Frequency Provider Last Rate Last Dose  . acetaminophen (TYLENOL) tablet 650 mg  650 mg Oral Q6H PRN Kerry Hough, PA-C   650 mg at 11/18/18 2124  . albuterol (PROVENTIL HFA;VENTOLIN HFA) 108 (90 Base) MCG/ACT inhaler 1-2 puff  1-2 puff Inhalation Q6H PRN Micheal Likens, MD      . alum & mag hydroxide-simeth (MAALOX/MYLANTA) 200-200-20 MG/5ML suspension 30 mL  30 mL Oral Q4H PRN Kerry Hough, PA-C      . aspirin EC tablet 81 mg  81 mg Oral Daily Micheal Likens, MD   81 mg at 11/19/18 0812  . atorvastatin (LIPITOR) tablet 80 mg  80 mg Oral q1800 Micheal Likens, MD   80 mg at 11/18/18 1700  . hydrOXYzine (ATARAX/VISTARIL) tablet 50 mg  50 mg Oral Q6H PRN Micheal Likens, MD      . isosorbide mononitrate (IMDUR) 24 hr tablet 30 mg  30 mg Oral Daily Micheal Likens, MD   30 mg at 11/19/18 1096  . lisinopril (PRINIVIL,ZESTRIL) tablet 5 mg  5 mg Oral Daily Micheal Likens, MD   5 mg at 11/19/18 0813  . [START ON 11/20/2018] LORazepam (ATIVAN) tablet 1 mg  1 mg Oral Daily Simon, Spencer E, PA-C      . magnesium hydroxide (MILK OF MAGNESIA) suspension 30 mL  30 mL Oral Daily PRN Kerry Hough, PA-C      .  metFORMIN (GLUCOPHAGE) tablet 500 mg  500 mg Oral BID Micheal Likens, MD   500 mg at 11/19/18 1610  . metoprolol tartrate (LOPRESSOR) tablet 50 mg  50 mg Oral BID Micheal Likens, MD   50 mg at 11/19/18 9604  . mometasone-formoterol (DULERA) 200-5 MCG/ACT inhaler 2 puff  2 puff Inhalation BID Micheal Likens, MD   2 puff at 11/19/18 [phone]  . multivitamin with minerals tablet 1 tablet  1 tablet Oral Daily Kerry Hough, PA-C   1 tablet at 11/19/18 8119  . nicotine (NICODERM CQ - dosed in mg/24 hours) patch 21 mg  21 mg Transdermal Daily Jolyne Loa T, MD   21 mg at 11/19/18 0813  . nitroGLYCERIN (NITROSTAT) SL tablet 0.4 mg  0.4 mg Sublingual Q5 min PRN Micheal Likens, MD      . pantoprazole (PROTONIX) EC tablet 40 mg  40 mg Oral Daily Micheal Likens, MD   40 mg at 11/19/18 1478  . sertraline (ZOLOFT) tablet 50 mg  50 mg Oral Daily Oneta Rack, NP   50 mg at 11/19/18 2956  . thiamine (B-1) injection 100 mg  100 mg Intramuscular Once Donell Sievert E, PA-C      . thiamine (VITAMIN B-1) tablet 100 mg  100 mg Oral Daily Donell Sievert E, PA-C   100 mg at 11/19/18 2130  . ticagrelor (BRILINTA) tablet 90 mg  90 mg Oral BID Micheal Likens, MD   90 mg at 11/19/18 8657    Lab Results:  No results found for this or any previous visit (from the past 48  hour(s)).  Blood Alcohol level:  Lab Results  Component Value Date   ETH 251 (H) 10/24/2018   ETH 198 (H) 10/22/2018    Metabolic Disorder Labs: Lab Results  Component Value Date   HGBA1C 5.2 11/17/2018   MPG 102.54 11/17/2018   MPG 128.37 11/01/2017   No results found for: PROLACTIN Lab Results  Component Value Date   CHOL 109 11/17/2018   TRIG 89 11/17/2018   HDL 60 11/17/2018   CHOLHDL 1.8 11/17/2018   VLDL 18 11/17/2018   LDLCALC 31 11/17/2018   LDLCALC 48 11/01/2017    Physical Findings: AIMS: Facial and Oral Movements Muscles of Facial Expression: None, normal Lips and Perioral Area: None, normal Jaw: None, normal Tongue: None, normal,Extremity Movements Upper (arms, wrists, hands, fingers): None, normal Lower (legs, knees, ankles, toes): None, normal, Trunk Movements Neck, shoulders, hips: None, normal, Overall Severity Severity of abnormal movements (highest score from questions above): None, normal Incapacitation due to abnormal movements: None, normal Patient's awareness of abnormal movements (rate only patient's report): No Awareness, Dental Status Current problems with teeth and/or dentures?: No Does patient usually wear dentures?: No  CIWA:  CIWA-Ar Total: 8 COWS:  COWS Total Score: 5  Musculoskeletal: Strength & Muscle Tone: within normal limits Gait & Station: normal Patient leans: N/A  Psychiatric Specialty Exam: Physical Exam  Nursing note and vitals reviewed. Cardiovascular: Normal rate.  Psychiatric: He has a normal mood and affect. His behavior is normal.    Review of Systems  Psychiatric/Behavioral: Positive for depression. Negative for hallucinations and suicidal ideas. The patient is nervous/anxious. The patient does not have insomnia.   All other systems reviewed and are negative.   Blood pressure 120/73, pulse 75, temperature 98.3 F (36.8 C), temperature source Oral, resp. rate 18, height 5' 4.5" (1.638 m), weight 69.4 kg, SpO2 99  %.Body mass index is  25.86 kg/m.  General Appearance: Casual and Paper scrubs, irritable, flat, guarded  Eye Contact:  Good  Speech:  Clear and Coherent and Normal Rate  Volume:  Normal  Mood:  Anxious and Depressed  Affect:  Congruent, Depressed and Flat  Thought Process:  Coherent and Goal Directed  Orientation:  Full (Time, Place, and Person)  Thought Content:  Logical  Suicidal Thoughts:  No  Homicidal Thoughts:  No  Memory:  Immediate;   Fair Recent;   Fair Remote;   Fair  Judgement:  Fair  Insight:  Fair  Psychomotor Activity:  Normal  Concentration:  Concentration: Fair  Recall:  [employer] of Knowledge:  Fair  Language:  Fair  Akathisia:  No  Handed:    AIMS (if indicated):     Assets:  Resilience Social Support  ADL's:  Intact  Cognition:  WNL  Sleep:  Number of Hours: 5.25   Treatment Plan Summary: Daily contact with patient to assess and evaluate symptoms and progress in treatment and Medication management   Continue with current treatment plan on 11/19/2018 as listed below except were noted  -MDD, recurrent, severe, without psychosis   -continue  Zoloft 50 mg  po qDay  -Alcohol use disorder (withdrawal)  -Continue CIWA with ativan taper and ativan 1mg  po q6h prn CIWA >10  -HTN   -Continue lisinopril 5mg  po qDay   -Continue Imdur 30mg  po qDay   -Continue lopressor 50mg  po BID  -HLD  -Continue lipitor 80mg  po qDay  -DMII   -Continue metformin 500mg  po BID  -COPD  -Continue dulera 200-5 mcg/act take 2 puffs inhaled BID   -Continue alubterol 108 mcg/act take 1-2 puffs inhaled q6h prn SOB/wheeze  -Hx of CAD/MI   -Continue aspirin 81mg  po qDay   -Continue nitrostat 0.4mg  SL q17min PRN chest pain   -Continue brilinta 90mg  po BID  -anxiety  -Continue vistaril 50mg  po q6h prn anxiety  -GERD  -Continue protonix 40mg  po qDay  -Encourage participation in groups and therapeutic milieu -disposition planning will be ongoing  Oneta Rack,  NP 11/19/2018, 11:55 AM

## 2018-11-19 NOTE — [facility] Group Notes (Signed)
LCSW Group Therapy Note  11/19/2018    10:00-11:00am   Type of Therapy and Topic:  Group Therapy: Early Messages Received About Anger  Participation Level:  Did Not Attend   Description of Group:   In this group, patients shared and discussed the early messages received in their lives about anger through parental or other adult modeling, teaching, repression, punishment, violence, and more.  Participants identified how those childhood lessons influence even now how they usually or often react when angered.  The group discussed that anger is a secondary emotion and what may be the underlying emotional themes that come out through anger outbursts or that are ignored through anger suppression.  Finally, as a group there was a conversation about the workbook's quote that "There is nothing wrong with anger; it is just a sign something needs to change."     Therapeutic Goals: 1. Patients will identify one or more childhood message about anger that they received and how it was taught to them. 2. Patients will discuss how these childhood experiences have influenced and continue to influence their own expression or repression of anger even today. 3. Patients will explore possible primary emotions that tend to fuel their secondary emotion of anger. 4. Patients will learn that anger itself is normal and cannot be eliminated, and that healthier coping skills can assist with resolving conflict rather than worsening situations.  Summary of Patient Progress:  N/A  Therapeutic Modalities:   Cognitive Behavioral Therapy Motivation Interviewing  Warren Salas  .

## 2018-11-19 NOTE — [facility] Group Notes (Signed)
Pt was invited but did not attend orientation group facilitated by MHT Michael A. 

## 2018-11-19 NOTE — Progress Notes (Signed)
D    Pt irritable and depressed   He did go to AA and was visible on the unit this evening   He endorses depression and anxiety  A   Verbal support given    Medications administered and effectiveness monitored   Q 15 min checks R   Pt remains safe at this time

## 2018-11-19 NOTE — Progress Notes (Signed)
Patient ID: Warren Salas, male   DOB: 1961-02-25, 57 y.o.   MRN: [id number]    D: Pt has been very flat, depressed and irritable today on the unit. Pt remained in the bed most of the day, he did not attend any groups nor did he engage in any treatment. Pt reported that he was not depressed, hopeless, or anxious. Pt refused to fill out patient self inventory sheet. Pt reported being negative SI/HI, no AH/VH noted. A: 15 min checks continued for patient safety. R: Pt safety maintained.

## 2018-11-19 NOTE — Plan of Care (Signed)
D: Patient is in bed on approach. Patient presents with flat facial expression and depressed affect. Patient is cooperative. Patient denies SI, HI, AVH, and verbally contracts for safety. Patient reports he has slept all day. Patient did not attend evening group. Patient did come to the day room for snack and evening meds. Patient reports lower left side back chronic pain 8/10. Patient requests PRN pain medication.    A: Scheduled medications administered per MD order. PRN pain medication administered per MD order. Support provided. Patient educated on safety on the unit and medications. Routine safety checks every 15 minutes. Patient stated understanding to tell nurse about any new physical symptoms. Patient understands to tell staff of any needs.     R: No adverse drug reactions noted. Patient verbally contracts for safety. Patient remains safe at this time and will continue to monitor.     Problem: Education: Goal: Knowledge of [facility] Education information/materials will improve Outcome: Progressing   Problem: Safety: Goal: Periods of time without injury will increase Outcome: Progressing   Patient oriented to the unit. Patient remains safe and will continue to monitor.

## 2018-11-19 NOTE — [facility] Group Notes (Addendum)
[facility] Group Notes:  (Nursing/MHT/Case Management/Adjunct)  Date:  11/19/2018  Time:  1:32 PM  Type of Therapy:  Psychoeducational Skills  Participation Level:  Did Not Attend  Participation Quality:  Did not attend  Affect:  Did not attend  Cognitive:  Did not attend  Insight:  None  Engagement in Group:  Did not attend  Modes of Intervention:  Did not attend  Summary of Progress/Problems: Pt did not attend Psychoeducational group with topic healthy support systems.   Jacquelyne BalintForrest, Hancel Ion Shanta 11/19/2018, 1:32 PM

## 2018-11-19 NOTE — Progress Notes (Signed)
Patient attended AA meeting.

## 2018-11-20 DIAGNOSIS — I10 Essential (primary) hypertension: Secondary | ICD-10-CM

## 2018-11-20 DIAGNOSIS — F1029 Alcohol dependence with unspecified alcohol-induced disorder: Secondary | ICD-10-CM

## 2018-11-20 DIAGNOSIS — F1410 Cocaine abuse, uncomplicated: Secondary | ICD-10-CM

## 2018-11-20 DIAGNOSIS — K219 Gastro-esophageal reflux disease without esophagitis: Secondary | ICD-10-CM

## 2018-11-20 MED ORDER — SERTRALINE HCL 25 MG PO TABS
75.0000 mg | ORAL_TABLET | Freq: Every day | ORAL | Status: DC
  Administered 2018-11-21 – 2018-11-22 (×2): 75 mg via ORAL
  Filled 2018-11-20 (×4): qty 3

## 2018-11-20 NOTE — Progress Notes (Signed)
[facility] MD Progress Note  11/20/2018 12:05 PM Warren Salas  MRN:  [id number] Subjective: Patient describes some improvement, but reports lingering depression and anxiety, and reports anxiety is worsening as he approaches potential discharge.  Currently does not endorse medication side effects.  Endorses intermittent passive SI but denies current suicidal plan or intention and contracts for safety on unit.   Objective: I have discussed case with treatment team and have met with patient. 57-year-old male with history of substance use disorder (alcohol, cocaine) depression, presented for worsening depression and suicidal ideations.  Had a recent psychiatric admission in early November at [facility] for anxiety, depression, alcohol dependence. At this time patient acknowledges partial improvement compared to admission but describes lingering anxiety, some depression and endorses intermittent passive thoughts of death/dying although denies any suicidal plan or intention and presents future oriented, currently focused on disposition planning. Limited group participation, cooperative on approach, not agitated or disruptive. Staff reports indicate lingering depression and vaguely irritable/flat affect. Has completed detox protocol with Ativan.  Currently does not present with significant withdrawal symptoms-no tremors no diaphoresis no psychomotor agitation noted.  Vital signs are stable.    Principal Problem: MDD (major depressive disorder), recurrent severe, without psychosis (HCC) Diagnosis: Principal Problem:   MDD (major depressive disorder), recurrent severe, without psychosis (HCC) Active Problems:   Alcohol dependence with alcohol-induced mood disorder (HCC)   Moderate cocaine use disorder (HCC)  Total Time spent with patient: 20 minutes  Past Psychiatric History: see H&P  Past Medical History:  Past Medical History:  Diagnosis Date  . Back pain   . COPD (chronic obstructive pulmonary disease)  (HCC)   . Diabetes mellitus without complication (HCC)   . Hypertension   . STEMI (ST elevation myocardial infarction) (HCC) 10/2017    Past Surgical History:  Procedure Laterality Date  . APPENDECTOMY    . CORONARY STENT INTERVENTION N/A 12/29/2017   Procedure: CORONARY STENT INTERVENTION - LAD;  Surgeon: Varanasi, Jayadeep S, MD;  Location: [facility];  Service: Cardiovascular;  Laterality: N/A;  . CORONARY/GRAFT ACUTE MI REVASCULARIZATION N/A 11/01/2017   Procedure: Coronary/Graft Acute MI Revascularization;  Surgeon: Varanasi, Jayadeep S, MD;  Location: [facility];  Service: Cardiovascular;  Laterality: N/A;  . LEFT HEART CATH AND CORONARY ANGIOGRAPHY N/A 11/01/2017   Procedure: LEFT HEART CATH AND CORONARY ANGIOGRAPHY;  Surgeon: Varanasi, Jayadeep S, MD;  Location: [facility];  Service: Cardiovascular;  Laterality: N/A;   Family History:  Family History  Problem Relation Age of Onset  . Hypertension Mother   . Diabetes Mother   . Hypertension Father   . Diabetes Father    Family Psychiatric  History: see H&P Social History:  Social History   Substance and Sexual Activity  Alcohol Use Yes  . Alcohol/week: 12.0 standard drinks  . Types: 12 Cans of beer per week     Social History   Substance and Sexual Activity  Drug Use Yes  . Types: Cocaine    Social History   Socioeconomic History  . Marital status: Divorced    Spouse name: Not on file  . Number of children: 3  . Years of education: Not on file  . Highest education level: Not on file  Occupational History  . Not on file  Social Needs  . Financial resource strain: Not on file  . Food insecurity:    Worry: Not on file    Inability: Not on file  . Transportation needs:    Medical:   Not on file    Non-medical: Not on file  Tobacco Use  . Smoking status: Current Every Day Smoker    Packs/day: 1.00    Types: Cigarettes  . Smokeless tobacco: Never Used  Substance and Sexual Activity  .  Alcohol use: Yes    Alcohol/week: 12.0 standard drinks    Types: 12 Cans of beer per week  . Drug use: Yes    Types: Cocaine  . Sexual activity: Never  Lifestyle  . Physical activity:    Days per week: Not on file    Minutes per session: Not on file  . Stress: Not on file  Relationships  . Social connections:    Talks on phone: Not on file    Gets together: Not on file    Attends religious service: Not on file    Active member of club or organization: Not on file    Attends meetings of clubs or organizations: Not on file    Relationship status: Not on file  Other Topics Concern  . Not on file  Social History Narrative  . Not on file   Additional Social History:    Pain Medications: See d/c med list from [facility] Prescriptions: See d/c med list from [facility] Over the Counter: see d/c med list from [facility] History of alcohol / drug use?: Yes Withdrawal Symptoms: Sweats, Nausea / Vomiting, Tremors, Fever / Chills, Diarrhea, Patient aware of relationship between substance abuse and physical/medical complications Name of Substance 1: ETOH (beer) 1 - Age of First Use: Teens 1 - Amount (size/oz): 12 pack  1 - Frequency: daily 1 - Duration: on-going 1 - Last Use / Amount: 11/15/18 Name of Substance 2: Cocaine 2 - Age of First Use: Unknown 2 - Amount (size/oz): Varies 2 - Frequency: Varies 2 - Duration: off and on 2 - Last Use / Amount: Pt can't recall  Sleep: Improving  Appetite:  Improving  Current Medications: Current Facility-Administered Medications  Medication Dose Route Frequency Provider Last Rate Last Dose  . acetaminophen (TYLENOL) tablet 650 mg  650 mg Oral Q6H PRN Simon, Spencer E, PA-C   650 mg at 11/18/18 2124  . albuterol (PROVENTIL HFA;VENTOLIN HFA) 108 (90 Base) MCG/ACT inhaler 1-2 puff  1-2 puff Inhalation Q6H PRN Rainville, Christopher T, MD      . alum & mag hydroxide-simeth (MAALOX/MYLANTA) 200-200-20 MG/5ML suspension 30 mL  30 mL Oral Q4H PRN Simon, Spencer E,  PA-C      . aspirin EC tablet 81 mg  81 mg Oral Daily Rainville, Christopher T, MD   81 mg at 11/20/18 0829  . atorvastatin (LIPITOR) tablet 80 mg  80 mg Oral q1800 Rainville, Christopher T, MD   80 mg at 11/19/18 1611  . hydrOXYzine (ATARAX/VISTARIL) tablet 50 mg  50 mg Oral Q6H PRN Rainville, Christopher T, MD   50 mg at 11/19/18 2123  . isosorbide mononitrate (IMDUR) 24 hr tablet 30 mg  30 mg Oral Daily Rainville, Christopher T, MD   30 mg at 11/20/18 0833  . lisinopril (PRINIVIL,ZESTRIL) tablet 5 mg  5 mg Oral Daily Rainville, Christopher T, MD   5 mg at 11/20/18 0830  . magnesium hydroxide (MILK OF MAGNESIA) suspension 30 mL  30 mL Oral Daily PRN Simon, Spencer E, PA-C      . metFORMIN (GLUCOPHAGE) tablet 500 mg  500 mg Oral BID Rainville, Christopher T, MD   500 mg at 11/20/18 0829  . metoprolol tartrate (LOPRESSOR) tablet 50 mg  50   mg Oral BID Pennelope Bracken, MD   50 mg at 11/20/18 0830  . mometasone-formoterol (DULERA) 200-5 MCG/ACT inhaler 2 puff  2 puff Inhalation BID Pennelope Bracken, MD   2 puff at 11/20/18 0831  . multivitamin with minerals tablet 1 tablet  1 tablet Oral Daily Laverle Hobby, PA-C   1 tablet at 11/20/18 0831  . nicotine (NICODERM CQ - dosed in mg/24 hours) patch 21 mg  21 mg Transdermal Daily Pennelope Bracken, MD   21 mg at 11/20/18 1638  . nitroGLYCERIN (NITROSTAT) SL tablet 0.4 mg  0.4 mg Sublingual Q5 min PRN Pennelope Bracken, MD      . pantoprazole (PROTONIX) EC tablet 40 mg  40 mg Oral Daily Pennelope Bracken, MD   40 mg at 11/20/18 0829  . sertraline (ZOLOFT) tablet 50 mg  50 mg Oral Daily Derrill Center, NP   50 mg at 11/20/18 0831  . thiamine (B-1) injection 100 mg  100 mg Intramuscular Once Patriciaann Clan E, PA-C      . thiamine (VITAMIN B-1) tablet 100 mg  100 mg Oral Daily Patriciaann Clan E, PA-C   100 mg at 11/20/18 4665  . ticagrelor (BRILINTA) tablet 90 mg  90 mg Oral BID Pennelope Bracken, MD   90 mg at  11/20/18 9935    Lab Results:  No results found for this or any previous visit (from the past 48 hour(s)).  Blood Alcohol level:  Lab Results  Component Value Date   ETH 251 (H) 10/24/2018   ETH 198 (H) 70/17/7939    Metabolic Disorder Labs: Lab Results  Component Value Date   HGBA1C 5.2 11/17/2018   MPG 102.54 11/17/2018   MPG 128.37 11/01/2017   No results found for: PROLACTIN Lab Results  Component Value Date   CHOL 109 11/17/2018   TRIG 89 11/17/2018   HDL 60 11/17/2018   CHOLHDL 1.8 11/17/2018   VLDL 18 11/17/2018   LDLCALC 31 11/17/2018   LDLCALC 48 11/01/2017    Physical Findings: AIMS: Facial and Oral Movements Muscles of Facial Expression: None, normal Lips and Perioral Area: None, normal Jaw: None, normal Tongue: None, normal,Extremity Movements Upper (arms, wrists, hands, fingers): None, normal Lower (legs, knees, ankles, toes): None, normal, Trunk Movements Neck, shoulders, hips: None, normal, Overall Severity Severity of abnormal movements (highest score from questions above): None, normal Incapacitation due to abnormal movements: None, normal Patient's awareness of abnormal movements (rate only patient's report): No Awareness, Dental Status Current problems with teeth and/or dentures?: No Does patient usually wear dentures?: No  CIWA:  CIWA-Ar Total: 3 COWS:  COWS Total Score: 5  Musculoskeletal: Strength & Muscle Tone: within normal limits-no tremors, no diaphoresis, no restlessness noted today Gait & Station: normal Patient leans: N/A  Psychiatric Specialty Exam: Physical Exam  Nursing note and vitals reviewed. Cardiovascular: Normal rate.  Psychiatric: He has a normal mood and affect. His behavior is normal.    Review of Systems  Psychiatric/Behavioral: Positive for depression. Negative for hallucinations and suicidal ideas. The patient is nervous/anxious. The patient does not have insomnia.   All other systems reviewed and are  negative. No chest pain, no shortness of breath, no bleeding  Blood pressure 130/90, pulse 75, temperature 97.9 F (36.6 C), temperature source Oral, resp. rate 18, height 5' 4.5" (1.638 m), weight 69.4 kg, SpO2 98 %.Body mass index is 25.86 kg/m.  General Appearance: Fairly Groomed  Eye Contact:  Fair-improves partially during session  Speech:  Normal Rate  Volume:  Normal  Mood:  Reports some improvement but describes lingering depression and anxiety  Affect:  Anxious, vaguely constricted  Thought Process:  Linear and Descriptions of Associations: Intact  Orientation:  Other:  Fully alert and attentive  Thought Content:  No hallucinations, no delusions expressed  Suicidal Thoughts:  Yes.  without intent/plan-endorses brief/intermittent passive thoughts of death but denies any plan or intention of hurting himself or of suicide and contracts for safety on unit  Homicidal Thoughts:  No  Memory:  Recent and remote grossly intact  Judgement:  Fair  Insight:  Fair  Psychomotor Activity:  Decreased-no current restlessness or psychomotor agitation  Concentration:  Concentration: Fair and Attention Span: Fair  Recall:  Fair  Fund of Knowledge:  Fair  Language:  Fair  Akathisia:  No  Handed:    AIMS (if indicated):     Assets:  Resilience Social Support  ADL's:  Intact  Cognition:  WNL  Sleep:  Number of Hours: 6.5   Assessment - 57-year-old male with history of substance use disorder (alcohol, cocaine) depression, presented for worsening depression and suicidal ideations.  Had a recent psychiatric admission in early November at [facility] for anxiety, depression, alcohol dependence.  Patient is endorsing partial improvement but describes lingering depression and anxiety.  Describes intermittent passive thoughts of death but denies any suicidal plan or intention and is future oriented, focusing on going to a rehab setting at discharge.  No current significant withdrawal symptoms noted or  reported.  Tolerating medications well thus far. Treatment Plan Summary: Daily contact with patient to assess and evaluate symptoms and progress in treatment and Medication management  Treatment plan reviewed as below today 11/25 Encourage group and milieu participation to work on coping skills and symptom reduction Encourage efforts to work on sobriety and relapse prevention Increase Zoloft to 75 mg daily for depression and anxiety Continue Lisinopril, Imdur, Lopressor for HTN Continue Metformin for DM Continue Brilinta/ASA for history of CAD/MI Treatment team working on disposition planning      A , MD 11/20/2018, 12:05 PM   Patient ID: Warren Salas, male   DOB: 02/04/1961, 57 y.o.   MRN: [id number]  

## 2018-11-20 NOTE — Progress Notes (Signed)
Attended group 

## 2018-11-20 NOTE — Progress Notes (Signed)
Patient ID: Sherre ScarletBarton Shader, male   DOB: November 17, 1961, 57 y.o.   MRN: [id number] PER STATE REGULATIONS 482.30  THIS CHART WAS REVIEWED FOR MEDICAL NECESSITY WITH RESPECT TO THE PATIENT'S ADMISSION/ DURATION OF STAY.  NEXT REVIEW DATE: 11/24/2018  Willa RoughJENNIFER JONES Megon Kalina, RN, BSN CASE MANAGER

## 2018-11-20 NOTE — Plan of Care (Signed)
Nurse discussed anxiety, depression, coping skills with patient. 

## 2018-11-20 NOTE — Progress Notes (Signed)
Recreation Therapy Notes  Date: 11.25.19 Time: 0930 Location: [address] Dayroom  Group Topic: Stress Management  Goal Area(s) Addresses:  Patient will verbalize importance of using healthy stress management.  Patient will identify positive emotions associated with healthy stress management.   Intervention: Stress Management  Activity :  Guided Imagery.  LRT introduced the stress management technique of guided imagery to patients.  LRT read a script that allowed patients to envision being outside looking at a starry sky at night.  Patients were to listen and follow along as script was read to engage in activity.  Education: Stress Management, Discharge Planning.   Education Outcome: Acknowledges edcuation/In group clarification offered/Needs additional education  Clinical Observations/Feedback: Pt did not attend group.     Caroll RancherMarjette Minha Fulco, LRT/CTRS         Caroll RancherLindsay, Wylder Macomber A 11/20/2018 11:28 AM

## 2018-11-20 NOTE — Progress Notes (Signed)
D:  Patient's self inventory sheet, patient has fair sleep, sleep medication helpful.  Fair appetite, low energy level, poor concentration.  Rated depression, hopeless and anxiety 8.  Denied withdrawals.  SI, contracts for safety on self inventory sheet.  Physical problems, pain, back, worst pain in past 24 hours is #7.  No discharge plan.  "I am not ready" for discharge. A:  Medications administered per MD orders.  Emotional support and encouragement given patient. R:  Denied SI while talking to nurse this morning, no plan.  Contracts for safety.  Denied HI, contracts for safety.  Denied A/V hallucinations.  Safety maintained with 15 minute checks.

## 2018-11-21 NOTE — [facility] Group Notes (Signed)
[facility] 11/21/2018 1:15pm  Type of Therapy: Mental Health Association Presentation  Participation Level: Active  Participation Quality: Attentive  Affect: Appropriate  Cognitive: Oriented  Insight: Developing/Improving  Engagement in Therapy: Engaged  Modes of Intervention: Discussion, Education and Socialization  Summary of Progress/Problems: Mental Health Association (MHA) Speaker came to talk about his personal journey with mental health. The pt processed ways by which to relate to the speaker. MHA speaker provided handouts and educational information pertaining to groups and services offered by the [facility]. Pt was engaged in speaker's presentation and was receptive to resources provided.    Rona RavensHeather S Vaneta Hammontree, LCSW 11/21/2018 2:02 PM

## 2018-11-21 NOTE — Progress Notes (Signed)
The patient attended the evening A.A.meeting and was appropriate.  

## 2018-11-21 NOTE — Progress Notes (Signed)
Pt reports his day has been "ok".  He states he has SI off and on still and does not feel he is ready for discharge.  He does denies SI this evening.  He denies HI/AVH.  He has been in the dayroom most of the evening, but with minimal interaction with the other patients.  Pt makes his needs known to staff.  Support and encouragement offered.  Discharge plans are in process.  Safety maintained with q15 minute checks.

## 2018-11-21 NOTE — Progress Notes (Signed)
[facility] MD Progress Note  11/21/2018 10:59 AM Warren Salas  MRN:  [id number] Subjective:    History as per psychiatric intake: Warren Salas is a [age] M with history of MDD, alcohol use disorder, and cocaine use disorder who was [facility] with worsening depression, SI with plan to cut himself with a knife, worsening use of alcohol, and worsening use of cocaine. Pt has recent relevant history of [facility] similar presentation on 10/27/18 to outpatient level of care. Pt was medically cleared and then [facility] and stabilization. Upon initial interview, pt shares recent history after most recent discharge [facility], stating, "Eerything started going good, but I didn't take my depression and anxiety medications, and then I basically got back to the way I was before I came in." Pt shares that he was not able to afford the $3 co-pay for his medications, so he did not fill the prescriptions for both his psychotropic and non-psychotropic medications. Pt was confronted with his report that he has been staying in a motel and paying for alcohol and cocaine, and he acknowledges this, stating, "I know better; guess I just need help." Pt reports worsening mood symptoms of depression, initial insomnia, anhedonia, guilty feelings, worthlessness, hopelessness, low energy, poor concentration, poor appetite, and psychomotor retardation. He endorses SI with plan to cut himself with a knife, and he is able to contract for safety while in the hospital. He denies HI/AH/VH. He denies symptoms of hypomania/mania, OCD, and PTSD. Her reports drinking about 12 beers per day, using cocaine about 3 days per week (via insufflation), and smoking tobacco 2ppd. He denies other illicit substance use. Discussed with patient about treatment options. He would like to resume on his previous treatment regimen of zoloft as well as resuming his medications for hx of CAD,  HLD, and DMII. He will be continued on CIWA with ativan taper. He is open to referral to residential substance use treatment, especially in context of being able to transition to stable housing in a sober living environment after completion of initial residential substance use treatment. He will work with SW team regarding this referral. Pt is in agreement with the above plan, and he had no further questions, comments, or concerns.  As per evaluation today: Today upon evaluation, pt shares, "I'm a little bit better - still feeling kind of depressed." He reports that SI have diminished overall, but he is still having some minimal SI without plan, and he is able to contract for safety. He denies any other specific concerns. He is sleeping well. His appetite is good. He denies other physical complaints. He denies HI/AH/VH. He is tolerating his medications well, and he is in agreement to continue his current regimen without changes. We discussed about disposition planning, and pt thinks he may feel safe to discharge as soon as tomorrow back to the motel where he was staying. We will tentatively plan for his discharge tomorrow if pt has ongoing improvement of his mood symptoms. Pt was in agreement with the above plan, and he had no further questions, comments, or concerns.  Principal Problem: MDD (major depressive disorder), recurrent severe, without psychosis (HCC) Diagnosis: Principal Problem:   MDD (major depressive disorder), recurrent severe, without psychosis (HCC) Active Problems:   Alcohol dependence with alcohol-induced mood disorder (HCC)   Moderate cocaine use disorder (HCC)  Total Time spent with patient: 30 minutes  Past Psychiatric History: see H&P  Past Medical History:  Past  Medical History:  Diagnosis Date  . Back pain   . COPD (chronic obstructive pulmonary disease) (HCC)   . Diabetes mellitus without complication (HCC)   . Hypertension   . STEMI (ST elevation myocardial  infarction) (HCC) 10/2017    Past Surgical History:  Procedure Laterality Date  . APPENDECTOMY    . CORONARY STENT INTERVENTION N/A 12/29/2017   Procedure: CORONARY STENT INTERVENTION - LAD;  Surgeon: Corky Crafts, MD;  Location: [facility] INVASIVE CV LAB;  Service: Cardiovascular;  Laterality: N/A;  . CORONARY/GRAFT ACUTE MI REVASCULARIZATION N/A 11/01/2017   Procedure: Coronary/Graft Acute MI Revascularization;  Surgeon: Corky Crafts, MD;  Location: [facility] INVASIVE CV LAB;  Service: Cardiovascular;  Laterality: N/A;  . LEFT HEART CATH AND CORONARY ANGIOGRAPHY N/A 11/01/2017   Procedure: LEFT HEART CATH AND CORONARY ANGIOGRAPHY;  Surgeon: Corky Crafts, MD;  Location: [facility] INVASIVE CV LAB;  Service: Cardiovascular;  Laterality: N/A;   Family History:  Family History  Problem Relation Age of Onset  . Hypertension Mother   . Diabetes Mother   . Hypertension Father   . Diabetes Father    Family Psychiatric  History: see H&P Social History:  Social History   Substance and Sexual Activity  Alcohol Use Yes  . Alcohol/week: 12.0 standard drinks  . Types: 12 Cans of beer per week     Social History   Substance and Sexual Activity  Drug Use Yes  . Types: Cocaine    Social History   Socioeconomic History  . Marital status: Divorced    Spouse name: Not on file  . Number of children: 3  . Years of education: Not on file  . Highest education level: Not on file  Occupational History  . Not on file  Social Needs  . Financial resource strain: Not on file  . Food insecurity:    Worry: Not on file    Inability: Not on file  . Transportation needs:    Medical: Not on file    Non-medical: Not on file  Tobacco Use  . Smoking status: Current Every Day Smoker    Packs/day: 1.00    Types: Cigarettes  . Smokeless tobacco: Never Used  Substance and Sexual Activity  . Alcohol use: Yes    Alcohol/week: 12.0 standard drinks    Types: 12 Cans of beer per week  . Drug use: Yes     Types: Cocaine  . Sexual activity: Never  Lifestyle  . Physical activity:    Days per week: Not on file    Minutes per session: Not on file  . Stress: Not on file  Relationships  . Social connections:    Talks on phone: Not on file    Gets together: Not on file    Attends religious service: Not on file    Active member of club or organization: Not on file    Attends meetings of clubs or organizations: Not on file    Relationship status: Not on file  Other Topics Concern  . Not on file  Social History Narrative  . Not on file   Additional Social History:    Pain Medications: See d/c med list from [facility] Prescriptions: See d/c med list from [facility] Over the Counter: see d/c med list from [facility] History of alcohol / drug use?: Yes Withdrawal Symptoms: Sweats, Nausea / Vomiting, Tremors, Fever / Chills, Diarrhea, Patient aware of relationship between substance abuse and physical/medical complications Name of Substance 1: ETOH (beer) 1 - Age  of First Use: Teens 1 - Amount (size/oz): 12 pack  1 - Frequency: daily 1 - Duration: on-going 1 - Last Use / Amount: 11/15/18 Name of Substance 2: Cocaine 2 - Age of First Use: Unknown 2 - Amount (size/oz): Varies 2 - Frequency: Varies 2 - Duration: off and on 2 - Last Use / Amount: Pt can't recall                Sleep: Fair  Appetite:  Good  Current Medications: Current Facility-Administered Medications  Medication Dose Route Frequency Provider Last Rate Last Dose  . acetaminophen (TYLENOL) tablet 650 mg  650 mg Oral Q6H PRN Kerry Hough, PA-C   650 mg at 11/18/18 2124  . albuterol (PROVENTIL HFA;VENTOLIN HFA) 108 (90 Base) MCG/ACT inhaler 1-2 puff  1-2 puff Inhalation Q6H PRN Micheal Likens, MD      . alum & mag hydroxide-simeth (MAALOX/MYLANTA) 200-200-20 MG/5ML suspension 30 mL  30 mL Oral Q4H PRN Kerry Hough, PA-C      . aspirin EC tablet 81 mg  81 mg Oral Daily Micheal Likens, MD   81 mg at  11/21/18 1610  . atorvastatin (LIPITOR) tablet 80 mg  80 mg Oral q1800 Micheal Likens, MD   80 mg at 11/20/18 1658  . hydrOXYzine (ATARAX/VISTARIL) tablet 50 mg  50 mg Oral Q6H PRN Micheal Likens, MD   50 mg at 11/20/18 2110  . isosorbide mononitrate (IMDUR) 24 hr tablet 30 mg  30 mg Oral Daily Micheal Likens, MD   30 mg at 11/21/18 9604  . lisinopril (PRINIVIL,ZESTRIL) tablet 5 mg  5 mg Oral Daily Micheal Likens, MD   5 mg at 11/21/18 5409  . magnesium hydroxide (MILK OF MAGNESIA) suspension 30 mL  30 mL Oral Daily PRN Donell Sievert E, PA-C      . metFORMIN (GLUCOPHAGE) tablet 500 mg  500 mg Oral BID Micheal Likens, MD   500 mg at 11/21/18 8119  . metoprolol tartrate (LOPRESSOR) tablet 50 mg  50 mg Oral BID Micheal Likens, MD   50 mg at 11/21/18 0809  . mometasone-formoterol (DULERA) 200-5 MCG/ACT inhaler 2 puff  2 puff Inhalation BID Micheal Likens, MD   2 puff at 11/20/18 1948  . multivitamin with minerals tablet 1 tablet  1 tablet Oral Daily Kerry Hough, PA-C   1 tablet at 11/21/18 0810  . nicotine (NICODERM CQ - dosed in mg/24 hours) patch 21 mg  21 mg Transdermal Daily Micheal Likens, MD   21 mg at 11/21/18 0810  . nitroGLYCERIN (NITROSTAT) SL tablet 0.4 mg  0.4 mg Sublingual Q5 min PRN Micheal Likens, MD      . pantoprazole (PROTONIX) EC tablet 40 mg  40 mg Oral Daily Micheal Likens, MD   40 mg at 11/21/18 1478  . sertraline (ZOLOFT) tablet 75 mg  75 mg Oral Daily Cobos, Rockey Situ, MD   75 mg at 11/21/18 0808  . thiamine (B-1) injection 100 mg  100 mg Intramuscular Once Donell Sievert E, PA-C      . thiamine (VITAMIN B-1) tablet 100 mg  100 mg Oral Daily Donell Sievert E, PA-C   100 mg at 11/21/18 2956  . ticagrelor (BRILINTA) tablet 90 mg  90 mg Oral BID Micheal Likens, MD   90 mg at 11/21/18 2130    Lab Results: No results found for this or any previous visit (from the past  48 hour(s)).  Blood Alcohol level:  Lab Results  Component Value Date   ETH 251 (H) 10/24/2018   ETH 198 (H) 10/22/2018    Metabolic Disorder Labs: Lab Results  Component Value Date   HGBA1C 5.2 11/17/2018   MPG 102.54 11/17/2018   MPG 128.37 11/01/2017   No results found for: PROLACTIN Lab Results  Component Value Date   CHOL 109 11/17/2018   TRIG 89 11/17/2018   HDL 60 11/17/2018   CHOLHDL 1.8 11/17/2018   VLDL 18 11/17/2018   LDLCALC 31 11/17/2018   LDLCALC 48 11/01/2017    Physical Findings: AIMS: Facial and Oral Movements Muscles of Facial Expression: None, normal Lips and Perioral Area: None, normal Jaw: None, normal Tongue: None, normal,Extremity Movements Upper (arms, wrists, hands, fingers): None, normal Lower (legs, knees, ankles, toes): None, normal, Trunk Movements Neck, shoulders, hips: None, normal, Overall Severity Severity of abnormal movements (highest score from questions above): None, normal Incapacitation due to abnormal movements: None, normal Patient's awareness of abnormal movements (rate only patient's report): No Awareness, Dental Status Current problems with teeth and/or dentures?: No Does patient usually wear dentures?: No  CIWA:  CIWA-Ar Total: 1 COWS:  COWS Total Score: 1  Musculoskeletal: Strength & Muscle Tone: within normal limits Gait & Station: normal Patient leans: N/A  Psychiatric Specialty Exam: Physical Exam  Nursing note and vitals reviewed.   Review of Systems  Constitutional: Negative for chills and fever.  Respiratory: Negative for cough and shortness of breath.   Cardiovascular: Negative for chest pain.  Gastrointestinal: Negative for abdominal pain, heartburn, nausea and vomiting.  Psychiatric/Behavioral: Positive for depression and suicidal ideas. Negative for hallucinations. The patient is nervous/anxious. The patient does not have insomnia.     Blood pressure 130/82, pulse 74, temperature 98 F (36.7 C),  temperature source Oral, resp. rate 18, height 5' 4.5" (1.638 m), weight 69.4 kg, SpO2 98 %.Body mass index is 25.86 kg/m.  General Appearance: Casual and Fairly Groomed  Eye Contact:  Good  Speech:  Clear and Coherent and Normal Rate  Volume:  Normal  Mood:  Anxious and Depressed  Affect:  Appropriate and Congruent  Thought Process:  Coherent and Goal Directed  Orientation:  Full (Time, Place, and Person)  Thought Content:  Logical  Suicidal Thoughts:  Yes.  without intent/plan  Homicidal Thoughts:  No  Memory:  Immediate;   Fair Recent;   Fair Remote;   Fair  Judgement:  Poor  Insight:  Lacking  Psychomotor Activity:  Normal  Concentration:  Concentration: Fair  Recall:  [employer] of Knowledge:  Fair  Language:  Fair  Akathisia:  No  Handed:    AIMS (if indicated):     Assets:  Resilience Social Support  ADL's:  Intact  Cognition:  WNL  Sleep:  Number of Hours: 6.75   Treatment Plan Summary: Daily contact with patient to assess and evaluate symptoms and progress in treatment and Medication management   -Continue inpatient hospitalization  -MDD, recurrent, severe, without psychosis              -Continue zoloft 75mg  po qDay  -Alcohol use disorder (withdrawal)             -Completed CIWA with ativan taper  -HTN              -Continue lisinopril 5mg  po qDay             -Continue Imdur 30mg  po qDay             -  Continue lopressor 50mg  po BID  -HLD             -Continue lipitor 80mg  po qDay  -DMII             -Continue metformin 500mg  po BID  -COPD             -Continue dulera 200-5 mcg/act take 2 puffs inhaled BID             -Continue albuterol 108 mcg/act take 1-2 puffs inhaled q6h prn SOB/wheeze  -Hx of CAD/MI             -Continue aspirin 81mg  po qDay             -Continue nitrostat 0.4mg  SL q33min PRN chest pain             -Continue brilinta 90mg  po BID  -anxiety             -Continue vistaril 50mg  po q6h prn anxiety  -GERD              -Continue protonix 40mg  po qDay  -Encourage participation in groups and therapeutic milieu  -disposition planning will be ongoing  Micheal Likens, MD 11/21/2018, 10:59 AM

## 2018-11-21 NOTE — Progress Notes (Signed)
CSW and MD met with pt to discuss disposition. Pt aware that he has daymark screening on 12/4 and [facility] referral has been made. He is agreeable to discharge on Wednesday and follow-up outpatient and will follow-up with [facility] on his own.  Jacee Enerson S. Ouida Sills, MSW, LCSW Clinical Social Worker 11/21/2018 9:36 AM

## 2018-11-21 NOTE — [facility] Group Notes (Signed)
Nursing Adult Psychoeducational Group Note  Date:  11/21/2018 Time:  4:00 PM  Group Topic/Focus: Self-Care Self Care:   The focus of this group is to help patients understand the importance of self-care in order to improve or restore emotional, physical, spiritual, interpersonal, and financial health.  Participation Level:  Active  Participation Quality:  Attentive  Affect:  Flat  Cognitive:  Oriented  Insight: Improving  Engagement in Group:  Developing/Improving  Modes of Intervention:  Activity, Discussion, Education and Socialization  Additional Comments:  Patient was appropriate throughout group and attentive to peers. Patient contributed to the discussion. Patient reports one self-care activity he will perform upon discharge is to stay away from the bars.  Rae Lipsmanda A Noreen Mackintosh 11/21/2018, 4:45 PM

## 2018-11-21 NOTE — Progress Notes (Signed)
Pt observed in in the dayroom, attending AA meeting. Pt appears depressed/sad in affect and mood. Brightens mildly on approach. Pt denies SI/HI/AVH/Pain at this time. Pt states he is suppose to be d/c tomorrow. No new c/o's. PRN vistaril requested and given. Support and encouragement offered. Will continue with POC.

## 2018-11-21 NOTE — Progress Notes (Signed)
DAR Note: Pt visible in hall on initial contact. Presents guarded with flat affect but forwards on interactions. Denies SI, HI, AVH and pain when assessed. Reports he's sleeping well with good appetite, good concentration and normal energy level. Cooperative with unit routines. Attended groups as scheduled and was engaged.  Emotional support and encouragement offered to pt throughout this shift. Scheduled medications given as ordered with verbal education and effects monitored. Safety checks maintained without self harm gestures or outburst to note thus far.  Pt receptive to care. Went off unit for activities, returned without issues. Compliant with medications when offered. Denies side effects when assessed. Denies active withdrawals symptoms at this time. POC for safety and mood stability.

## 2018-11-22 MED ORDER — ATORVASTATIN CALCIUM 80 MG PO TABS: 80.0000 mg | ORAL_TABLET | Freq: Every day | ORAL | 0 refills | Status: DC

## 2018-11-22 MED ORDER — SERTRALINE HCL 50 MG PO TABS: 75.0000 mg | ORAL_TABLET | Freq: Every day | ORAL | 0 refills | Status: DC

## 2018-11-22 MED ORDER — METFORMIN HCL 500 MG PO TABS: 500.0000 mg | ORAL_TABLET | Freq: Two times a day (BID) | ORAL | 0 refills | Status: DC

## 2018-11-22 MED ORDER — LISINOPRIL 10 MG PO TABS: 5.0000 mg | ORAL_TABLET | Freq: Every day | ORAL | 0 refills | Status: DC

## 2018-11-22 MED ORDER — ASPIRIN EC 81 MG PO TBEC: 81.0000 mg | DELAYED_RELEASE_TABLET | Freq: Every day | ORAL | 0 refills | Status: DC

## 2018-11-22 MED ORDER — ISOSORBIDE MONONITRATE ER 30 MG PO TB24: 30.0000 mg | ORAL_TABLET | Freq: Every day | ORAL | 0 refills | Status: DC

## 2018-11-22 MED ORDER — HYDROXYZINE HCL 50 MG PO TABS: 50.0000 mg | ORAL_TABLET | Freq: Four times a day (QID) | ORAL | 0 refills | Status: DC | PRN

## 2018-11-22 MED ORDER — METOPROLOL TARTRATE 50 MG PO TABS: 50.0000 mg | ORAL_TABLET | Freq: Two times a day (BID) | ORAL | 1 refills | Status: DC

## 2018-11-22 MED ORDER — NITROGLYCERIN 0.4 MG SL SUBL: 0.4000 mg | SUBLINGUAL_TABLET | SUBLINGUAL | 0 refills | Status: DC | PRN

## 2018-11-22 MED ORDER — TICAGRELOR 90 MG PO TABS: 90.0000 mg | ORAL_TABLET | Freq: Two times a day (BID) | ORAL | 0 refills | Status: DC

## 2018-11-22 MED ORDER — OMEPRAZOLE 20 MG PO CPDR: 20.0000 mg | DELAYED_RELEASE_CAPSULE | Freq: Every day | ORAL | 0 refills | Status: DC

## 2018-11-22 MED ORDER — ALBUTEROL SULFATE HFA 108 (90 BASE) MCG/ACT IN AERS: 1.0000 | INHALATION_SPRAY | Freq: Four times a day (QID) | RESPIRATORY_TRACT | 0 refills | Status: DC | PRN

## 2018-11-22 MED ORDER — BUDESONIDE-FORMOTEROL FUMARATE 160-4.5 MCG/ACT IN AERO: 2.0000 | INHALATION_SPRAY | Freq: Two times a day (BID) | RESPIRATORY_TRACT | 0 refills | Status: DC | PRN

## 2018-11-22 NOTE — [facility] Group Notes (Signed)
Pt was invited but did not attend group facilitated by [facility] Caren C. 

## 2018-11-22 NOTE — Discharge Summary (Signed)
Physician Discharge Summary Note  Patient:  Warren Salas is an 57 y.o., male MRN:  [id number] DOB:  Sep 14, 1961 Patient phone:  [phone] (home)  Patient address:   [address] [place] [address],  Total Time spent with patient: 30 minutes  Date of Admission:  11/16/2018 Date of Discharge: 11/22/2018  Reason for Admission:  Depression, SI, alcohol use  Principal Problem: MDD (major depressive disorder), recurrent severe, without psychosis (HCC) Discharge Diagnoses: Principal Problem:   MDD (major depressive disorder), recurrent severe, without psychosis (HCC) Active Problems:   Alcohol dependence with alcohol-induced mood disorder (HCC)   Moderate cocaine use disorder (HCC)   Past Psychiatric History: see H&P  Past Medical History:  Past Medical History:  Diagnosis Date  . Back pain   . COPD (chronic obstructive pulmonary disease) (HCC)   . Diabetes mellitus without complication (HCC)   . Hypertension   . STEMI (ST elevation myocardial infarction) (HCC) 10/2017    Past Surgical History:  Procedure Laterality Date  . APPENDECTOMY    . CORONARY STENT INTERVENTION N/A 12/29/2017   Procedure: CORONARY STENT INTERVENTION - LAD;  Surgeon: Corky CraftsVaranasi, Jayadeep S, MD;  Location: [facility];  Service: Cardiovascular;  Laterality: N/A;  . CORONARY/GRAFT ACUTE MI REVASCULARIZATION N/A 11/01/2017   Procedure: Coronary/Graft Acute MI Revascularization;  Surgeon: Corky CraftsVaranasi, Jayadeep S, MD;  [facility];  Service: Cardiovascular;  Laterality: N/A;  . LEFT HEART CATH AND CORONARY ANGIOGRAPHY N/A 11/01/2017   Procedure: LEFT HEART CATH AND CORONARY ANGIOGRAPHY;  Surgeon: Corky CraftsVaranasi, Jayadeep S, MD;  [facility];  Service: Cardiovascular;  Laterality: N/A;   Family History:  Family History  Problem Relation Age of Onset  . Hypertension Mother   . Diabetes Mother   . Hypertension Father   . Diabetes Father    Family Psychiatric  History: see  H&P Social History:  Social History   Substance and Sexual Activity  Alcohol Use Yes  . Alcohol/week: 12.0 standard drinks  . Types: 12 Cans of beer per week     Social History   Substance and Sexual Activity  Drug Use Yes  . Types: Cocaine    Social History   Socioeconomic History  . Marital status: Divorced    Spouse name: Not on file  . Number of children: 3  . Years of education: Not on file  . Highest education level: Not on file  Occupational History  . Not on file  Social Needs  . Financial resource strain: Not on file  . Food insecurity:    Worry: Not on file    Inability: Not on file  . Transportation needs:    Medical: Not on file    Non-medical: Not on file  Tobacco Use  . Smoking status: Current Every Day Smoker    Packs/day: 1.00    Types: Cigarettes  . Smokeless tobacco: Never Used  Substance and Sexual Activity  . Alcohol use: Yes    Alcohol/week: 12.0 standard drinks    Types: 12 Cans of beer per week  . Drug use: Yes    Types: Cocaine  . Sexual activity: Never  Lifestyle  . Physical activity:    Days per week: Not on file    Minutes per session: Not on file  . Stress: Not on file  Relationships  . Social connections:    Talks on phone: Not on file    Gets together: Not on file    Attends religious service: Not on  file    Active member of club or organization: Not on file    Attends meetings of clubs or organizations: Not on file    Relationship status: Not on file  Other Topics Concern  . Not on file  Social History Narrative  . Not on file    Hospital Course:    History as per psychiatric intake: Warren Salas is a 38 M with history of MDD, alcohol use disorder, and cocaine use disorder who was admitted voluntarily from [facility] with worsening depression, SI with plan to cut himself with a knife, worsening use of alcohol, and worsening use of cocaine. Pt has recent relevant history of discharge from [facility] for similar  presentation on 10/27/18 to outpatient level of care. Pt was medically cleared and then transferred to [facility] for additional treatment and stabilization. Upon initial interview, pt shares recent history after most recent discharge from [facility], stating, "Eerything started going good, but I didn't take my depression and anxiety medications, and then I basically got back to the way I was before I came in." Pt shares that he was not able to afford the $3 co-pay for his medications, so he did not fill the prescriptions for both his psychotropic and non-psychotropic medications. Pt was confronted with his report that he has been staying in a motel and paying for alcohol and cocaine, and he acknowledges this, stating, "I know better; guess I just need help." Pt reports worsening mood symptoms of depression, initial insomnia, anhedonia, guilty feelings, worthlessness, hopelessness, low energy, poor concentration, poor appetite, and psychomotor retardation. He endorses SI with plan to cut himself with a knife, and he is able to contract for safety while in the hospital. He denies HI/AH/VH. He denies symptoms of hypomania/mania, OCD, and PTSD. Her reports drinking about 12 beers per day, using cocaine about 3 days per week (via insufflation), and smoking tobacco 2ppd. He denies other illicit substance use. Discussed with patient about treatment options. He would like to resume on his previous treatment regimen of zoloft as well as resuming his medications for hx of CAD, HLD, and DMII. He will be continued on CIWA with ativan taper. He is open to referral to residential substance use treatment, especially in context of being able to transition to stable housing in a sober living environment after completion of initial residential substance use treatment. He will work with SW team regarding this referral. Pt is in agreement with the above plan, and he had no further questions, comments, or concerns.  As per evaluation  today: Today upon evaluation, pt shares, "I'm better - I'm not having those thoughts any more." He denies any specific concerns. He is sleeping well. His appetite is good. He denies other physical complaints including symptoms of withdrawal. He denies SI/HI/AH/VH. He is tolerating his medications well, and he is in agreement to continue his current regimen without changes. He plans to follow up at [facility] for mental health management. He will have a [facility] intake for alcohol use treatment on 12/4 and he reports he will be able to maintain his safety until that time. He was able to engage in safety planning including plan to return to [facility] or contact emergency services if he feels unable to maintain his own safety or the safety of others. Pt had no further questions, comments, or concerns.   The patient is at low risk of imminent suicide. Patient denied thoughts, intent, or plan for harm to self or others, expressed significant future orientation, and  expressed an ability to mobilize assistance for his needs. He is presently void of any contributing psychiatric symptoms, cognitive difficulties, or substance use which would elevate his risk for lethality. Chronic risk for lethality is elevated in light of poor social support, poor adherence, and impulsivity. The chronic risk is presently mitigated by his ongoing desire and engagement in [facility] treatment and mobilization of support from family and friends. Chronic risk may elevate if he experiences any significant loss or worsening of symptoms, which can be managed and monitored through outpatient providers. At this time, acute risk for lethality is low and he is stable for ongoing outpatient management.    Modifiable risk factors were addressed during this hospitalization through appropriate pharmacotherapy and establishment of outpatient follow-up treatment. Some risk factors for suicide are situational (i.e. Unstable social support) or related personality pathology  (i.e. Poor coping mechanisms) and thus cannot be further mitigated by continued hospitalization in this setting.   Physical Findings: AIMS: Facial and Oral Movements Muscles of Facial Expression: None, normal Lips and Perioral Area: None, normal Jaw: None, normal Tongue: None, normal,Extremity Movements Upper (arms, wrists, hands, fingers): None, normal Lower (legs, knees, ankles, toes): None, normal, Trunk Movements Neck, shoulders, hips: None, normal, Overall Severity Severity of abnormal movements (highest score from questions above): None, normal Incapacitation due to abnormal movements: None, normal Patient's awareness of abnormal movements (rate only patient's report): No Awareness, Dental Status Current problems with teeth and/or dentures?: No Does patient usually wear dentures?: No  CIWA:  CIWA-Ar Total: 1 COWS:  COWS Total Score: 1  Musculoskeletal: Strength & Muscle Tone: within normal limits Gait & Station: normal Patient leans: N/A  Psychiatric Specialty Exam: Physical Exam  Nursing note and vitals reviewed.   Review of Systems  Constitutional: Negative for chills and fever.  Respiratory: Negative for cough and shortness of breath.   Cardiovascular: Negative for chest pain.  Gastrointestinal: Negative for abdominal pain, heartburn, nausea and vomiting.  Psychiatric/Behavioral: Negative for depression, hallucinations and suicidal ideas. The patient is not nervous/anxious and does not have insomnia.     Blood pressure 121/84, pulse 69, temperature 98.3 F (36.8 C), temperature source Oral, resp. rate 18, height 5' 4.5" (1.638 m), weight 69.4 kg, SpO2 98 %.Body mass index is 25.86 kg/m.  General Appearance: Casual and Fairly Groomed  Eye Contact:  Good  Speech:  Clear and Coherent and Normal Rate  Volume:  Normal  Mood:  Euthymic  Affect:  Appropriate and Congruent  Thought Process:  Coherent and Goal Directed  Orientation:  Full (Time, Place, and Person)   Thought Content:  Logical  Suicidal Thoughts:  No  Homicidal Thoughts:  No  Memory:  Immediate;   Fair Recent;   Fair Remote;   Fair  Judgement:  Fair  Insight:  Fair  Psychomotor Activity:  Normal  Concentration:  Concentration: Fair  Recall:  [employer] of Knowledge:  Fair  Language:  Fair  Akathisia:  No  Handed:    AIMS (if indicated):     Assets:  Resilience Social Support  ADL's:  Intact  Cognition:  WNL  Sleep:  Number of Hours: 6     Have you used any form of tobacco in the last 30 days? (Cigarettes, Smokeless Tobacco, Cigars, and/or Pipes): Yes  Has this patient used any form of tobacco in the last 30 days? (Cigarettes, Smokeless Tobacco, Cigars, and/or Pipes) Yes, Yes, A prescription for an FDA-approved tobacco cessation medication was offered at discharge and the patient refused  Blood Alcohol level:  Lab Results  Component Value Date   ETH 251 (H) 10/24/2018   ETH 198 (H) 10/22/2018    Metabolic Disorder Labs:  Lab Results  Component Value Date   HGBA1C 5.2 11/17/2018   MPG 102.54 11/17/2018   MPG 128.37 11/01/2017   No results found for: PROLACTIN Lab Results  Component Value Date   CHOL 109 11/17/2018   TRIG 89 11/17/2018   HDL 60 11/17/2018   CHOLHDL 1.8 11/17/2018   VLDL 18 11/17/2018   LDLCALC 31 11/17/2018   LDLCALC 48 11/01/2017    See Psychiatric Specialty Exam and Suicide Risk Assessment completed by Attending Physician prior to discharge.  Discharge destination:  Home  Is patient on multiple antipsychotic therapies at discharge:  No   Has Patient had three or more failed trials of antipsychotic monotherapy by history:  No  Recommended Plan for Multiple Antipsychotic Therapies: NA   Allergies as of 11/22/2018   No Known Allergies     Medication List    TAKE these medications     Indication  albuterol 108 (90 Base) MCG/ACT inhaler Commonly known as:  PROVENTIL HFA;VENTOLIN HFA Inhale 1-2 puffs into the lungs every 6 (six)  hours as needed for wheezing.  Indication:  Asthma   aspirin EC 81 MG tablet Take 1 tablet (81 mg total) by mouth daily.  Indication:  History of myocardial infarction   atorvastatin 80 MG tablet Commonly known as:  LIPITOR Take 1 tablet (80 mg total) by mouth daily at 6 PM.  Indication:  High Amount of Fats in the Blood   budesonide-formoterol 160-4.5 MCG/ACT inhaler Commonly known as:  SYMBICORT Inhale 2 puffs into the lungs 2 (two) times daily as needed (For shortness of breath.).  Indication:  Asthma   hydrOXYzine 50 MG tablet Commonly known as:  ATARAX/VISTARIL Take 1 tablet (50 mg total) by mouth every 6 (six) hours as needed for anxiety. What changed:    medication strength  how much to take  Indication:  Feeling Anxious   isosorbide mononitrate 30 MG 24 hr tablet Commonly known as:  IMDUR Take 1 tablet (30 mg total) by mouth daily.  Indication:  History of coronary artery disease   lisinopril 10 MG tablet Commonly known as:  PRINIVIL,ZESTRIL Take 0.5 tablets (5 mg total) by mouth daily.  Indication:  High Blood Pressure Disorder   metFORMIN 500 MG tablet Commonly known as:  GLUCOPHAGE Take 1 tablet (500 mg total) by mouth 2 (two) times daily.  Indication:  Type 2 Diabetes   metoprolol tartrate 50 MG tablet Commonly known as:  LOPRESSOR Take 1 tablet (50 mg total) by mouth 2 (two) times daily.  Indication:  High Blood Pressure Disorder   nitroGLYCERIN 0.4 MG SL tablet Commonly known as:  NITROSTAT Place 1 tablet (0.4 mg total) under the tongue every 5 (five) minutes as needed.  Indication:  Acute Angina Pectoris   omeprazole 20 MG capsule Commonly known as:  PRILOSEC Take 1 capsule (20 mg total) by mouth daily.  Indication:  Gastroesophageal Reflux Disease   sertraline 50 MG tablet Commonly known as:  ZOLOFT Take 1.5 tablets (75 mg total) by mouth daily. Start taking on:  11/23/2018 What changed:    medication strength  how much to  take  additional instructions  Indication:  Major Depressive Disorder   ticagrelor 90 MG Tabs tablet Commonly known as:  BRILINTA Take 1 tablet (90 mg total) by mouth 2 (two) times daily.  Indication:  Acute Coronary Syndrome      Follow-up Information    [facility] Follow up.   Specialty:  Addiction Medicine Why:  [facility] referral made 11/17/18. If you are still interested in pursuing residential treatment, please contact admissions department daily to check status of referral. Thank you.  Contact information: [address] [place] [address] [phone]        [facility]. Go on 11/29/2018.   Why:  Please attend screening appointment on Wednesday, 11/29/18 at 7:45am.  Please bring: photo ID, proof of insurance, and medications. Please call to cancel if you decide that you do not want to attend. Thank you.   Contact information: [address] [place] [address] [phone]        [facility] Follow up.   Specialty:  Behavioral Health Why:  Hospital follow-up on Thursday, 12/5 at 8:00AM. Thank you.  [address] [place] [address] [phone]           Follow-up recommendations:  Activity:  as tolerated Diet:  normal Tests:  NA Other:  see above for DC plan  Comments:    Signed: Micheal Likens, MD 11/22/2018, 8:50 AM

## 2018-11-22 NOTE — [facility] Suicide Risk Assessment (Signed)
[facility]   Principal Problem: MDD (major depressive disorder), recurrent severe, without psychosis (HCC) Discharge Diagnoses: Principal Problem:   MDD (major depressive disorder), recurrent severe, without psychosis (HCC) Active Problems:   Alcohol dependence with alcohol-induced mood disorder (HCC)   Moderate cocaine use disorder (HCC)   Total Time spent with patient: 30 minutes  Psychiatric Specialty Exam:   Blood pressure 121/84, pulse 69, temperature 98.3 F (36.8 C), temperature source Oral, resp. rate 18, height 5' 4.5" (1.638 m), weight 69.4 kg, SpO2 98 %.Body mass index is 25.86 kg/m.   Mental Status Per Nursing Assessment::   On Admission:  Suicidal ideation indicated by patient, Self-harm thoughts  Demographic Factors:  Male, Caucasian, Low socioeconomic status, Living alone and Unemployed  Loss Factors: Financial problems/change in socioeconomic status  Historical Factors: Family history of mental illness or substance abuse and Impulsivity  Risk Reduction Factors:   Positive social support, Positive therapeutic relationship and Positive coping skills or problem solving skills  Continued Clinical Symptoms:  Depression:   Comorbid alcohol abuse/dependence Impulsivity Alcohol/Substance Abuse/Dependencies  Cognitive Features That Contribute To Risk:  None    Suicide Risk:  Minimal: No identifiable suicidal ideation.  Patients presenting with no risk factors but with morbid ruminations; may be classified as minimal risk based on the severity of the depressive symptoms  Follow-up Information    [facility] Follow up.   Specialty:  Addiction Medicine Why:  [facility] referral made 11/17/18. If you are still interested in pursuing residential treatment, please contact admissions department daily to check status of referral. Thank you.  Contact information: [address] [place]  [id number] [phone]        [facility]. Go on 11/29/2018.   Why:  Please attend screening appointment on Wednesday, 11/29/18 at 7:45am.  Please bring: photo ID, proof of insurance, and medications. Please call to cancel if you decide that you do not want to attend. Thank you.   Contact information: [address] [place] [address] [phone]        [facility] Follow up.   Specialty:  Behavioral Health Why:  Hospital follow-up on Thursday, 12/5 at 8:00AM. Thank you.  Contact information: [address] [place] [address] [phone]           Plan Of Care/Follow-up recommendations:  Activity:  as tolerated Diet:  normal Tests:  NA Other:  see above for DC plan  Micheal Likenshristopher T Yianni Skilling, MD 11/22/2018, 8:50 AM

## 2018-11-22 NOTE — Progress Notes (Signed)
D: Pt A & O X 4. Denies SI, HI, AVH and pain at this time. D/C home as ordered. Taxi voucher given to cab driver by Clinical research associatewriter at time of departure.   A: D/C instructions reviewed with pt including prescriptions  and follow up appointments; compliance encouraged. All belongings from assigned locker given to pt at time of departure. Scheduled  medications given with verbal education and effects monitored. Safety checks maintained without incident till time of d/c.  R: Pt receptive to care. Compliant with medications when offered. Denies adverse drug reactions when assessed. Verbalized understanding related to d/c instructions. Signed belonging sheet in agreement with items received from locker. Ambulatory with a steady gait. Appears to be in no physical distress at time of departure.

## 2018-11-22 NOTE — Tx Team (Signed)
Interdisciplinary Treatment and Diagnostic Plan Update  11/22/2018 Time of Session:  [id number] Warren Salas MRN: [id number]  Principal Diagnosis: MDD (major depressive disorder), recurrent severe, without psychosis (HCC)  Secondary Diagnoses: Principal Problem:   MDD (major depressive disorder), recurrent severe, without psychosis (HCC) Active Problems:   Alcohol dependence with alcohol-induced mood disorder (HCC)   Moderate cocaine use disorder (HCC)   Current Medications:  Current Facility-Administered Medications  Medication Dose Route Frequency Provider Last Rate Last Dose  . acetaminophen (TYLENOL) tablet 650 mg  650 mg Oral Q6H PRN Kerry HoughSimon, Spencer E, PA-C   650 mg at 11/18/18 2124  . albuterol (PROVENTIL HFA;VENTOLIN HFA) 108 (90 Base) MCG/ACT inhaler 1-2 puff  1-2 puff Inhalation Q6H PRN Micheal Likensainville, Christopher T, MD      . alum & mag hydroxide-simeth (MAALOX/MYLANTA) 200-200-20 MG/5ML suspension 30 mL  30 mL Oral Q4H PRN Kerry HoughSimon, Spencer E, PA-C      . aspirin EC tablet 81 mg  81 mg Oral Daily Micheal Likensainville, Christopher T, MD   81 mg at 11/22/18 0826  . atorvastatin (LIPITOR) tablet 80 mg  80 mg Oral q1800 Micheal Likensainville, Christopher T, MD   80 mg at 11/21/18 1706  . hydrOXYzine (ATARAX/VISTARIL) tablet 50 mg  50 mg Oral Q6H PRN Micheal Likensainville, Christopher T, MD   50 mg at 11/21/18 2129  . isosorbide mononitrate (IMDUR) 24 hr tablet 30 mg  30 mg Oral Daily Micheal Likensainville, Christopher T, MD   30 mg at 11/22/18 0826  . lisinopril (PRINIVIL,ZESTRIL) tablet 5 mg  5 mg Oral Daily Micheal Likensainville, Christopher T, MD   5 mg at 11/22/18 0826  . magnesium hydroxide (MILK OF MAGNESIA) suspension 30 mL  30 mL Oral Daily PRN Donell SievertSimon, Spencer E, PA-C      . metFORMIN (GLUCOPHAGE) tablet 500 mg  500 mg Oral BID Micheal Likensainville, Christopher T, MD   500 mg at 11/22/18 0827  . metoprolol tartrate (LOPRESSOR) tablet 50 mg  50 mg Oral BID Micheal Likensainville, Christopher T, MD   50 mg at 11/22/18 0826  . mometasone-formoterol (DULERA) 200-5  MCG/ACT inhaler 2 puff  2 puff Inhalation BID Micheal Likensainville, Christopher T, MD   2 puff at 11/21/18 2129  . multivitamin with minerals tablet 1 tablet  1 tablet Oral Daily Kerry HoughSimon, Spencer E, PA-C   1 tablet at 11/22/18 0827  . nicotine (NICODERM CQ - dosed in mg/24 hours) patch 21 mg  21 mg Transdermal Daily Jolyne Loaainville, Christopher T, MD   21 mg at 11/22/18 0827  . nitroGLYCERIN (NITROSTAT) SL tablet 0.4 mg  0.4 mg Sublingual Q5 min PRN Micheal Likensainville, Christopher T, MD      . pantoprazole (PROTONIX) EC tablet 40 mg  40 mg Oral Daily Micheal Likensainville, Christopher T, MD   40 mg at 11/22/18 0826  . sertraline (ZOLOFT) tablet 75 mg  75 mg Oral Daily Cobos, Rockey SituFernando A, MD   75 mg at 11/22/18 0826  . thiamine (B-1) injection 100 mg  100 mg Intramuscular Once Donell SievertSimon, Spencer E, PA-C      . thiamine (VITAMIN B-1) tablet 100 mg  100 mg Oral Daily Donell SievertSimon, Spencer E, PA-C   100 mg at 11/22/18 0826  . ticagrelor (BRILINTA) tablet 90 mg  90 mg Oral BID Micheal Likensainville, Christopher T, MD   90 mg at 11/22/18 [id number]   PTA Medications: Medications Prior to Admission  Medication Sig Dispense Refill Last Dose  . albuterol (PROVENTIL HFA;VENTOLIN HFA) 108 (90 Base) MCG/ACT inhaler Inhale 1-2 puffs into the lungs every 6 (six) hours  as needed for wheezing. 1 Inhaler 0 Past Month at Unknown time  . aspirin EC 81 MG tablet Take 81 mg by mouth daily.   unk  . atorvastatin (LIPITOR) 80 MG tablet Take 1 tablet (80 mg total) by mouth daily at 6 PM. 90 tablet 3 unk  . budesonide-formoterol (SYMBICORT) 160-4.5 MCG/ACT inhaler Inhale 2 puffs into the lungs 2 (two) times daily as needed (For shortness of breath.).    unk  . hydrOXYzine (ATARAX/VISTARIL) 25 MG tablet Take 1 tablet (25 mg total) by mouth every 6 (six) hours as needed for anxiety. 30 tablet 0   . isosorbide mononitrate (IMDUR) 30 MG 24 hr tablet Take 1 tablet (30 mg total) by mouth daily. 90 tablet 3 unk  . lisinopril (PRINIVIL,ZESTRIL) 10 MG tablet Take 0.5 tablets (5 mg total) by mouth  daily. 45 tablet 3 unk  . metFORMIN (GLUCOPHAGE) 500 MG tablet Take 1 tablet (500 mg total) by mouth 2 (two) times daily. 60 tablet 0   . metoprolol tartrate (LOPRESSOR) 50 MG tablet Take 1 tablet (50 mg total) by mouth 2 (two) times daily. 180 tablet 3 unk  . nitroGLYCERIN (NITROSTAT) 0.4 MG SL tablet Place 1 tablet (0.4 mg total) under the tongue every 5 (five) minutes as needed. 25 tablet 2 unk at prn  . omeprazole (PRILOSEC) 20 MG capsule Take 1 capsule (20 mg total) by mouth daily. 14 capsule 0 unk  . sertraline (ZOLOFT) 25 MG tablet Take 1 tablet (25 mg total) by mouth daily. For mood control 30 tablet 0   . ticagrelor (BRILINTA) 90 MG TABS tablet Take 1 tablet (90 mg total) by mouth 2 (two) times daily. 180 tablet 3 unk    Patient Stressors: Financial difficulties Medication change or noncompliance Substance abuse  Patient Strengths: Ability for insight Active sense of humor Capable of independent living Communication skills  Treatment Modalities: Medication Management, Group therapy, Case management,  1 to 1 session with clinician, Psychoeducation, Recreational therapy.   Physician Treatment Plan for Primary Diagnosis: MDD (major depressive disorder), recurrent severe, without psychosis (HCC) Long Term Goal(s): Improvement in symptoms so as ready for discharge Improvement in symptoms so as ready for discharge   Short Term Goals: Ability to identify and develop effective coping behaviors will improve Ability to demonstrate self-control will improve  Medication Management: Evaluate patient's response, side effects, and tolerance of medication regimen.  Therapeutic Interventions: 1 to 1 sessions, Unit Group sessions and Medication administration.  Evaluation of Outcomes: Adequate for discharge  Physician Treatment Plan for Secondary Diagnosis: Principal Problem:   MDD (major depressive disorder), recurrent severe, without psychosis (HCC) Active Problems:   Alcohol  dependence with alcohol-induced mood disorder (HCC)   Moderate cocaine use disorder (HCC)  Long Term Goal(s): Improvement in symptoms so as ready for discharge Improvement in symptoms so as ready for discharge   Short Term Goals: Ability to identify and develop effective coping behaviors will improve Ability to demonstrate self-control will improve     Medication Management: Evaluate patient's response, side effects, and tolerance of medication regimen.  Therapeutic Interventions: 1 to 1 sessions, Unit Group sessions and Medication administration.  Evaluation of Outcomes: Adequate for discharge   RN Treatment Plan for Primary Diagnosis: MDD (major depressive disorder), recurrent severe, without psychosis (HCC) Long Term Goal(s): Knowledge of disease and therapeutic regimen to maintain health will improve  Short Term Goals: Ability to participate in decision making will improve, Ability to verbalize feelings will improve, Ability to disclose and discuss  suicidal ideas and Ability to identify and develop effective coping behaviors will improve  Medication Management: RN will administer medications as ordered by provider, will assess and evaluate patient's response and provide education to patient for prescribed medication. RN will report any adverse and/or side effects to prescribing provider.  Therapeutic Interventions: 1 on 1 counseling sessions, Psychoeducation, Medication administration, Evaluate responses to treatment, Monitor vital signs and CBGs as ordered, Perform/monitor CIWA, COWS, AIMS and Fall Risk screenings as ordered, Perform wound care treatments as ordered.  Evaluation of Outcomes: Adequate for discharge  LCSW Treatment Plan for Primary Diagnosis: MDD (major depressive disorder), recurrent severe, without psychosis (HCC) Long Term Goal(s): Safe transition to appropriate next level of care at discharge, Engage patient in therapeutic group addressing interpersonal  concerns.  Short Term Goals: Engage patient in aftercare planning with referrals and resources  Therapeutic Interventions: Assess for all discharge needs, 1 to 1 time with Social worker, Explore available resources and support systems, Assess for adequacy in community support network, Educate family and significant other(s) on suicide prevention, Complete Psychosocial Assessment, Interpersonal group therapy.  Evaluation of Outcomes: Adequate for discharge  Progress in Treatment: Attending groups: Yes Participating in groups: Yes Taking medication as prescribed: Yes. Toleration medication: Yes. Family/Significant other contact made: SPE completed with pt; pt declined to consent to collateral contact.  Patient understands diagnosis: Yes. Discussing patient identified problems/goals with staff: Yes. Medical problems stabilized or resolved: Yes. Denies suicidal/homicidal ideation: Yes. Issues/concerns per patient self-inventory: No. Other:   New problem(s) identified: n/a  New Short Term/Long Term Goal(s):Detox, medication stabilization, elimination of SI thoughts, development of comprehensive mental wellness plan.    Patient Goals:  Need to keep my mind right    Discharge Plan or Barriers: Pt plans to return home. Follow[facility]up in [facility] at [facility]. Pt has [facility] on 12/4 and has been referred to [facility]. Pt also provided with [employer] [facility] pamphlet, Mobile Crisis information, and AA/NA information provided to patient for additional community support and resources.   Reason for Continuation of Hospitalization: none  Estimated Length of Stay: today, 11/22/18  Attendees: Patient: 11/22/2018 8:43 AM  Physician: Dr. Jolyne Loa, MD 11/22/2018 8:43 AM  Nursing: Lincoln Maxin RN 11/22/2018 8:43 AM  RN Care Manager: Juliann Pares 11/22/2018 8:43 AM  Social Worker: Corrie Mckusick LCSW 11/22/2018 8:43 AM  Recreational Therapist:  11/22/2018 8:43 AM  Other: Armandina Stammer NP 11/22/2018 8:43 AM  Other:  11/22/2018 8:43 AM  Other: 11/22/2018 8:43 AM    Scribe for Treatment Team: Rona Ravens, LCSW 11/22/2018 8:43 AM

## 2018-11-22 NOTE — Progress Notes (Signed)
Recreation Therapy Notes  Date: 11.27.19 Time: 930 Location: 300 Hall Dayroom  Group Topic: Stress Management  Goal Area(s) Addresses:  Patient will verbalize importance of using healthy stress management.  Patient will identify positive emotions associated with healthy stress management.   Intervention: Stress Management  Activity :  Meditation.  LRT introduced the stress management technique of meditation.  LRT played a meditation that guided patients to focus on the present and let go of things they can't change.  Patients were to follow along as the meditation played.  Education:  Stress Management, Discharge Planning.   Education Outcome: Acknowledges edcuation/In group clarification offered/Needs additional education  Clinical Observations/Feedback: Pt did not attend group.     Caroll RancherMarjette Cinsere Mizrahi, LRT/CTRS         Lillia AbedLindsay, Ashaki Frosch A 11/22/2018 11:02 AM

## 2018-11-22 NOTE — Progress Notes (Signed)
  [facility] Discharge Plan :  Will you be returning to the same living situation after discharge:  Yes,  home At discharge, do you have transportation home?: Yes, bus or friend.  Do you have the ability to pay for your medications: Yes,  medicare  Release of information consent forms completed and submitted to medical records by CSW.  Patient to Follow up at: Follow-up Information    [facility] Follow up.   Specialty:  Addiction Medicine Why:  [facility] referral made 11/17/18. If you are still interested in pursuing residential treatment, please contact admissions department daily to check status of referral. Thank you.  Contact information: [address] [place] [address] [phone]        [facility]. Go on 11/29/2018.   Why:  Please attend screening appointment on Wednesday, 11/29/18 at 7:45am.  Please bring: photo ID, proof of insurance, and medications. Please call to cancel if you decide that you do not want to attend. Thank you.   Contact information: [address] [place] [address] [phone]        [facility] Follow up.   Specialty:  Behavioral Health Why:  Hospital follow-up on Thursday, 12/5 at 8:00AM. Thank you.  Contact information: [address] [place] [address] [phone]           Next level of care provider has access to [facility] Link:no  Safety Planning and Suicide Prevention discussed: Yes,  SPE completed with pt; pt declined to consent to collateral contact. SPI pamphlet and mobile Crisis information provided to pt.   Have you used any form of tobacco in the last 30 days? (Cigarettes, Smokeless Tobacco, Cigars, and/or Pipes): Yes  Has patient been referred to the Quitline?: Patient refused referral  Patient has been referred for addiction treatment: Yes  Rona RavensHeather S Khrystyna Schwalm, LCSW 11/22/2018, 8:42 AM

## 2018-12-03 ENCOUNTER — Encounter (HOSPITAL_COMMUNITY): Payer: Self-pay

## 2018-12-03 ENCOUNTER — Emergency Department (HOSPITAL_COMMUNITY)
Admission: EM | Admit: 2018-12-03 | Discharge: 2018-12-03 | Disposition: A | Discharge status: Home / Self Care | Payer: Medicare Other | Attending: Emergency Medicine | Admitting: Emergency Medicine

## 2018-12-03 ENCOUNTER — Emergency Department (HOSPITAL_COMMUNITY): Payer: Medicare Other

## 2018-12-03 DIAGNOSIS — E119 Type 2 diabetes mellitus without complications: Secondary | ICD-10-CM | POA: Diagnosis not present

## 2018-12-03 DIAGNOSIS — R45851 Suicidal ideations: Secondary | ICD-10-CM | POA: Insufficient documentation

## 2018-12-03 DIAGNOSIS — F17210 Nicotine dependence, cigarettes, uncomplicated: Secondary | ICD-10-CM | POA: Insufficient documentation

## 2018-12-03 DIAGNOSIS — F10229 Alcohol dependence with intoxication, unspecified: Secondary | ICD-10-CM | POA: Diagnosis not present

## 2018-12-03 DIAGNOSIS — Z79899 Other long term (current) drug therapy: Secondary | ICD-10-CM | POA: Diagnosis not present

## 2018-12-03 DIAGNOSIS — R0789 Other chest pain: Secondary | ICD-10-CM | POA: Diagnosis present

## 2018-12-03 DIAGNOSIS — R079 Chest pain, unspecified: Secondary | ICD-10-CM | POA: Diagnosis not present

## 2018-12-03 DIAGNOSIS — Z590 Homelessness: Secondary | ICD-10-CM | POA: Insufficient documentation

## 2018-12-03 DIAGNOSIS — F1994 Other psychoactive substance use, unspecified with psychoactive substance-induced mood disorder: Secondary | ICD-10-CM | POA: Diagnosis not present

## 2018-12-03 DIAGNOSIS — Z7984 Long term (current) use of oral hypoglycemic drugs: Secondary | ICD-10-CM | POA: Diagnosis not present

## 2018-12-03 DIAGNOSIS — F1410 Cocaine abuse, uncomplicated: Secondary | ICD-10-CM

## 2018-12-03 DIAGNOSIS — Y908 Blood alcohol level of 240 mg/100 ml or more: Secondary | ICD-10-CM | POA: Insufficient documentation

## 2018-12-03 DIAGNOSIS — J449 Chronic obstructive pulmonary disease, unspecified: Secondary | ICD-10-CM | POA: Diagnosis not present

## 2018-12-03 DIAGNOSIS — I252 Old myocardial infarction: Secondary | ICD-10-CM | POA: Diagnosis not present

## 2018-12-03 DIAGNOSIS — F10920 Alcohol use, unspecified with intoxication, uncomplicated: Secondary | ICD-10-CM

## 2018-12-03 DIAGNOSIS — F1010 Alcohol abuse, uncomplicated: Secondary | ICD-10-CM

## 2018-12-03 LAB — CBC WITH DIFFERENTIAL/PLATELET
Abs Immature Granulocytes: 0.03 10*3/uL (ref 0.00–0.07)
BASOS ABS: 0.1 10*3/uL (ref 0.0–0.1)
Basophils Relative: 1 %
EOS PCT: 2 %
Eosinophils Absolute: 0.2 10*3/uL (ref 0.0–0.5)
HCT: 50.2 % (ref 39.0–52.0)
HEMOGLOBIN: 15.8 g/dL (ref 13.0–17.0)
IMMATURE GRANULOCYTES: 0 %
LYMPHS PCT: 49 %
Lymphs Abs: 4.7 10*3/uL — ABNORMAL HIGH (ref 0.7–4.0)
MCH: 32.2 pg (ref 26.0–34.0)
MCHC: 31.5 g/dL (ref 30.0–36.0)
MCV: 102.4 fL — ABNORMAL HIGH (ref 80.0–100.0)
Monocytes Absolute: 1 10*3/uL (ref 0.1–1.0)
Monocytes Relative: 10 %
NEUTROS ABS: 3.7 10*3/uL (ref 1.7–7.7)
NEUTROS PCT: 38 %
NRBC: 0 % (ref 0.0–0.2)
Platelets: 218 10*3/uL (ref 150–400)
RBC: 4.9 MIL/uL (ref 4.22–5.81)
RDW: 12 % (ref 11.5–15.5)
WBC: 9.6 10*3/uL (ref 4.0–10.5)

## 2018-12-03 LAB — RAPID URINE DRUG SCREEN, HOSP PERFORMED
Amphetamines: NOT DETECTED
Barbiturates: NOT DETECTED
Benzodiazepines: NOT DETECTED
Cocaine: POSITIVE — AB
Opiates: NOT DETECTED
Tetrahydrocannabinol: NOT DETECTED

## 2018-12-03 LAB — BASIC METABOLIC PANEL
ANION GAP: 18 — AB (ref 5–15)
BUN: <5 mg/dL — ABNORMAL LOW (ref 6–20)
CALCIUM: 9 mg/dL (ref 8.9–10.3)
CHLORIDE: 105 mmol/L (ref 98–111)
CO2: 17 mmol/L — ABNORMAL LOW (ref 22–32)
CREATININE: 0.56 mg/dL — AB (ref 0.61–1.24)
GFR calc non Af Amer: >60 mL/min (ref >60)
Glucose, Bld: 107 mg/dL — ABNORMAL HIGH (ref 70–99)
Potassium: 3.8 mmol/L (ref 3.5–5.1)
SODIUM: 140 mmol/L (ref 135–145)

## 2018-12-03 LAB — SALICYLATE LEVEL: Salicylate Lvl: <7 mg/dL (ref 2.8–30.0)

## 2018-12-03 LAB — ETHANOL: Alcohol, Ethyl (B): 250 mg/dL — ABNORMAL HIGH (ref <10)

## 2018-12-03 LAB — ACETAMINOPHEN LEVEL: Acetaminophen (Tylenol), Serum: <10 ug/mL — ABNORMAL LOW (ref 10–30)

## 2018-12-03 LAB — I-STAT TROPONIN, ED
TROPONIN I, POC: 0.01 ng/mL (ref 0.00–0.08)
Troponin i, poc: 0 ng/mL (ref 0.00–0.08)

## 2018-12-03 MED ORDER — VITAMIN B-1 100 MG PO TABS: 100.0000 mg | ORAL_TABLET | Freq: Every day | ORAL | Status: DC

## 2018-12-03 MED ORDER — LORAZEPAM 2 MG/ML IJ SOLN: 0.0000 mg | Freq: Four times a day (QID) | INTRAMUSCULAR | Status: DC

## 2018-12-03 MED ORDER — LORAZEPAM 1 MG PO TABS
0.0000 mg | ORAL_TABLET | Freq: Four times a day (QID) | ORAL | Status: DC
  Administered 2018-12-03: 1 mg via ORAL
  Filled 2018-12-03: qty 1

## 2018-12-03 MED ORDER — LORAZEPAM 1 MG PO TABS: 0.0000 mg | ORAL_TABLET | Freq: Two times a day (BID) | ORAL | Status: DC

## 2018-12-03 MED ORDER — THIAMINE HCL 100 MG/ML IJ SOLN: 100.0000 mg | Freq: Every day | INTRAMUSCULAR | Status: DC

## 2018-12-03 MED ORDER — LORAZEPAM 2 MG/ML IJ SOLN: 0.0000 mg | Freq: Two times a day (BID) | INTRAMUSCULAR | Status: DC

## 2018-12-03 NOTE — ED Notes (Signed)
Ordered diet tray for pt  

## 2018-12-03 NOTE — ED Triage Notes (Signed)
Pt arrived via [facility]; pt homeless on the side of the road; bystanders called due to patient being on side of road; Pt c/o CP, centralized; Hx of DM, MI, HTN; pt was given 324 ASA but chewed them up and spit them out per EMS; no nitro attempted; pt not cooperative; 124/96, 97, 18, CBG 145

## 2018-12-03 NOTE — ED Provider Notes (Signed)
[facility] Provider Note   CSN: [id number] Arrival date & time: 12/03/18  0402     History   Chief Complaint Chief Complaint  Patient presents with  . Chest Pain    SI/ETOH    HPI Warren Salas is a 57 y.o. male.  Patient presents to the emergency department with a chief complaint of chest pain and suicidal ideation.  He is homeless, and states that he just wants his life to and.  States "I want to be out of this world."  He is unable to clarify when his chest pain started.  When questioned about his chest pain, he states I just want to die.  He denies any shortness of breath or radiating pain.  Denies diaphoresis.  Was given aspirin prior to arrival, but spit them out.  The history is provided by the patient. No language interpreter was used.    Past Medical History:  Diagnosis Date  . Back pain   . COPD (chronic obstructive pulmonary disease) (HCC)   . Diabetes mellitus without complication (HCC)   . Hypertension   . STEMI (ST elevation myocardial infarction) (HCC) 10/2017    Patient Active Problem List   Diagnosis Date Noted  . Moderate cocaine use disorder (HCC) 11/16/2018  . MDD (major depressive disorder), recurrent severe, without psychosis (HCC) 10/27/2018  . Alcohol dependence with alcohol-induced mood disorder (HCC)   . CAD (coronary artery disease) 12/12/2017  . Old MI (myocardial infarction) 12/12/2017  . Tobacco abuse 12/12/2017  . Type 2 diabetes mellitus with complication, without long-term current use of insulin (HCC) 12/12/2017  . Acute MI, inferior wall (HCC) 11/01/2017  . Acute inferior myocardial infarction [facility](HCC)     Past Surgical History:  Procedure Laterality Date  . APPENDECTOMY    . CORONARY STENT INTERVENTION N/A 12/29/2017   Procedure: CORONARY STENT INTERVENTION - LAD;  Surgeon: Corky CraftsVaranasi, Jayadeep S, MD;  Location: [facility] INVASIVE CV LAB;  Service: Cardiovascular;  Laterality: N/A;  . CORONARY/GRAFT  ACUTE MI REVASCULARIZATION N/A 11/01/2017   Procedure: Coronary/Graft Acute MI Revascularization;  Surgeon: Corky CraftsVaranasi, Jayadeep S, MD;  [facility];  Service: Cardiovascular;  Laterality: N/A;  . LEFT HEART CATH AND CORONARY ANGIOGRAPHY N/A 11/01/2017   Procedure: LEFT HEART CATH AND CORONARY ANGIOGRAPHY;  Surgeon: Corky CraftsVaranasi, Jayadeep S, MD;  [facility];  Service: Cardiovascular;  Laterality: N/A;        Home Medications    Prior to Admission medications   Medication Sig Start Date End Date Taking? Authorizing Provider  albuterol (PROVENTIL HFA;VENTOLIN HFA) 108 (90 Base) MCG/ACT inhaler Inhale 1-2 puffs into the lungs every 6 (six) hours as needed for wheezing. 11/22/18   Micheal Likensainville, Christopher T, MD  aspirin EC 81 MG tablet Take 1 tablet (81 mg total) by mouth daily. 11/22/18   Micheal Likensainville, Christopher T, MD  atorvastatin (LIPITOR) 80 MG tablet Take 1 tablet (80 mg total) by mouth daily at 6 PM. 11/22/18   Rainville, Burlene Arnthristopher T, MD  budesonide-formoterol (SYMBICORT) 160-4.5 MCG/ACT inhaler Inhale 2 puffs into the lungs 2 (two) times daily as needed (For shortness of breath.). 11/22/18   Micheal Likensainville, Christopher T, MD  hydrOXYzine (ATARAX/VISTARIL) 50 MG tablet Take 1 tablet (50 mg total) by mouth every 6 (six) hours as needed for anxiety. 11/22/18   Micheal Likensainville, Christopher T, MD  isosorbide mononitrate (IMDUR) 30 MG 24 hr tablet Take 1 tablet (30 mg total) by mouth daily. 11/22/18   Micheal Likensainville, Christopher T, MD  lisinopril (PRINIVIL,ZESTRIL) 10 MG tablet Take 0.5 tablets (5 mg total) by mouth daily. 11/22/18   Micheal Likens, MD  metFORMIN (GLUCOPHAGE) 500 MG tablet Take 1 tablet (500 mg total) by mouth 2 (two) times daily. 11/22/18   Micheal Likens, MD  metoprolol tartrate (LOPRESSOR) 50 MG tablet Take 1 tablet (50 mg total) by mouth 2 (two) times daily. 11/22/18   Micheal Likens, MD  nitroGLYCERIN (NITROSTAT) 0.4 MG SL tablet Place  1 tablet (0.4 mg total) under the tongue every 5 (five) minutes as needed. 11/22/18   Micheal Likens, MD  omeprazole (PRILOSEC) 20 MG capsule Take 1 capsule (20 mg total) by mouth daily. 11/22/18   Micheal Likens, MD  sertraline (ZOLOFT) 50 MG tablet Take 1.5 tablets (75 mg total) by mouth daily. 11/23/18   Micheal Likens, MD  ticagrelor (BRILINTA) 90 MG TABS tablet Take 1 tablet (90 mg total) by mouth 2 (two) times daily. 11/22/18   Micheal Likens, MD    Family History Family History  Problem Relation Age of Onset  . Hypertension Mother   . Diabetes Mother   . Hypertension Father   . Diabetes Father     Social History Social History   Tobacco Use  . Smoking status: Current Every Day Smoker    Packs/day: 1.00    Types: Cigarettes  . Smokeless tobacco: Never Used  Substance Use Topics  . Alcohol use: Yes    Alcohol/week: 12.0 standard drinks    Types: 12 Cans of beer per week  . Drug use: Yes    Types: Cocaine     Allergies   Patient has no known allergies.   Review of Systems Review of Systems  All other systems reviewed and are negative.    Physical Exam Updated Vital Signs BP 122/80 (BP Location: Right Arm)   Pulse 86   Temp 97.6 F (36.4 C) (Oral)   Resp (!) 22   SpO2 93%   Physical Exam  Constitutional: He is oriented to person, place, and time. He appears well-developed and well-nourished.  intoxicated  HENT:  Head: Normocephalic and atraumatic.  Eyes: Pupils are equal, round, and reactive to light. Conjunctivae and EOM are normal. Right eye exhibits no discharge. Left eye exhibits no discharge. No scleral icterus.  Neck: Normal range of motion. Neck supple. No JVD present.  Cardiovascular: Normal rate, regular rhythm and normal heart sounds. Exam reveals no gallop and no friction rub.  No murmur heard. Pulmonary/Chest: Effort normal and breath sounds normal. No respiratory distress. He has no wheezes. He has no  rales. He exhibits no tenderness.  Abdominal: Soft. He exhibits no distension and no mass. There is no tenderness. There is no rebound and no guarding.  Musculoskeletal: Normal range of motion. He exhibits no edema or tenderness.  Neurological: He is alert and oriented to person, place, and time.  Skin: Skin is warm and dry.  Psychiatric: He has a normal mood and affect. His behavior is normal. Judgment and thought content normal.  Nursing note and vitals reviewed.    ED Treatments / Results  Labs (all labs ordered are listed, but only abnormal results are displayed) Labs Reviewed - No data to display  EKG None  Radiology No results found.  Procedures Procedures (including critical care time)  Medications Ordered in ED Medications - No data to display   Initial Impression / Assessment and Plan / ED Course  I have reviewed the triage vital signs and  the nursing notes.  Pertinent labs & imaging results that were available during my care of the patient were reviewed by me and considered in my medical decision making (see chart for details).     Patient here with suicidal ideation.  Also reports cocaine abuse and chest pain.  No ischemic EKG changes.  Initial troponin negative.  Plan for repeat troponin and TTS evaluation.  Signed out to [place], PA-C.  Final Clinical Impressions(s) / ED Diagnoses   Final diagnoses:  Substance induced mood disorder (HCC)  Alcoholic intoxication without complication (HCC)  Chronic alcohol abuse  Cocaine abuse [facility])    ED Discharge Orders    None       Roxy Horseman, PA-C 12/03/18 2218    Dione Booze, MD 12/12/18 2225

## 2018-12-03 NOTE — ED Provider Notes (Signed)
Warren Salas is a 57 y.o. male, presenting to the ED with suicidal ideations and chest pain. Patient would not talk to me about his suicidal ideations or his chest pain.    HPI from Ivar Drape, PA-C: "Patient presents to the emergency department with a chief complaint of chest pain and suicidal ideation.  He is homeless, and states that he just wants his life to and.  States "I want to be out of this world."  He is unable to clarify when his chest pain started.  When questioned about his chest pain, he states I just want to die.  He denies any shortness of breath or radiating pain.  Denies diaphoresis.  Was given aspirin prior to arrival, but spit them out."   Past Medical History:  Diagnosis Date  . Back pain   . COPD (chronic obstructive pulmonary disease) (HCC)   . Diabetes mellitus without complication (HCC)   . Hypertension   . STEMI (ST elevation myocardial infarction) (HCC) 10/2017    Physical Exam  BP 110/76   Pulse 87   Temp 97.6 F (36.4 C) (Oral)   Resp 18   SpO2 91%   Physical Exam  Constitutional: He is oriented to person, place, and time. He appears well-developed and well-nourished. No distress.  HENT:  Head: Normocephalic and atraumatic.  Eyes: Conjunctivae are normal.  Neck: Neck supple.  Cardiovascular: Normal rate, regular rhythm, normal heart sounds and intact distal pulses.  Pulmonary/Chest: Effort normal and breath sounds normal. No respiratory distress.  Abdominal: Soft. There is no tenderness. There is no guarding.  Musculoskeletal: He exhibits no edema.  Lymphadenopathy:    He has no cervical adenopathy.  Neurological: He is alert and oriented to person, place, and time.  Skin: Skin is warm and dry. He is not diaphoretic.  Psychiatric: He has a normal mood and affect. His behavior is normal.  Nursing note and vitals reviewed.   ED Course/Procedures     Procedures   Abnormal Labs Reviewed  CBC WITH DIFFERENTIAL/PLATELET - Abnormal; Notable  for the following components:      Result Value   MCV 102.4 (*)    Lymphs Abs 4.7 (*)    All other components within normal limits  BASIC METABOLIC PANEL - Abnormal; Notable for the following components:   CO2 17 (*)    Glucose, Bld 107 (*)    BUN <5 (*)    Creatinine, Ser 0.56 (*)    Anion gap 18 (*)    All other components within normal limits  RAPID URINE DRUG SCREEN, HOSP PERFORMED - Abnormal; Notable for the following components:   Cocaine POSITIVE (*)    All other components within normal limits  ETHANOL - Abnormal; Notable for the following components:   Alcohol, Ethyl (B) 250 (*)    All other components within normal limits  ACETAMINOPHEN LEVEL - Abnormal; Notable for the following components:   Acetaminophen (Tylenol), Serum <10 (*)    All other components within normal limits   Dg Chest 2 View  Result Date: 12/03/2018 CLINICAL DATA:  Initial evaluation for acute chest pain. EXAM: CHEST - 2 VIEW COMPARISON:  Prior CT from 10/22/2018. FINDINGS: The cardiac and mediastinal silhouettes are stable in size and contour, and remain within normal limits. The lungs are normally inflated. No airspace consolidation, pleural effusion, or pulmonary edema is identified. There is no pneumothorax. No acute osseous abnormality identified. IMPRESSION: No active cardiopulmonary disease. Electronically Signed   By: Janell Quiet.D.  On: 12/03/2018 05:53    MDM   Took patient care handoff report from [employer] Energy[employer] Browning, [place]. Plan: Repeat troponin at 7:30 AM then TTS consult.  Patient alert and oriented.  Moving all extremities.  Delta troponins negative. Interviewed by Dr. Denton LankSteinl after EDP shift changed. Contracted for safety. Will be discharged with resources for outpatient follow up.   Vitals:   12/03/18 0500 12/03/18 0515 12/03/18 0530 12/03/18 0545  BP: 115/83 113/79 109/73 110/76  Pulse: 83 83 84 87  Resp: 17 (!) 22 18 18   Temp:      TempSrc:      SpO2: 95% 92% 93% 91%        Anselm PancoastJoy, Shawn C, PA-C 12/03/18 1450    Dione BoozeGlick, David, MD 12/12/18 2229

## 2018-12-03 NOTE — ED Provider Notes (Signed)
Patient is awake and alert. Pt eating/drinking.  Pt indicates is now feeling improved.  Denies thoughts of harm to self or others/no SI.   Pt is encouraged to f/u closely with AA, and use resource guide provided for substance abuse rehab.   Pt also is noted with substance abuse/etoh associated mood disorder - is [facility].  Pt currently appears stable for d/c.      Warren Salas, Warren Ysaguirre, MD 12/03/18 [phone]

## 2018-12-03 NOTE — Discharge Instructions (Addendum)
It was our pleasure to provide your ER care today - we hope that you feel better.  For your health, avoid alcohol and cocaine use - follow up with AA, and use resource guide to access additional community resources.  No driving for the next 6 hours, or anytime when drinking alcohol.   For mental health issues and/or crisis, go directly to [facility].  Also follow up with primary care doctor in the coming week.  Return to ER if worse, new symptoms, fevers, trouble breathing, other concern.

## 2018-12-04 ENCOUNTER — Encounter (HOSPITAL_COMMUNITY): Payer: Self-pay

## 2018-12-04 ENCOUNTER — Emergency Department (HOSPITAL_COMMUNITY)
Admission: EM | Admit: 2018-12-04 | Discharge: 2018-12-05 | Disposition: A | Discharge status: Other Acute Inpatient Hospital | Payer: Medicare Other | Attending: Emergency Medicine | Admitting: Emergency Medicine

## 2018-12-04 ENCOUNTER — Other Ambulatory Visit: Payer: Self-pay

## 2018-12-04 DIAGNOSIS — I10 Essential (primary) hypertension: Secondary | ICD-10-CM | POA: Insufficient documentation

## 2018-12-04 DIAGNOSIS — F332 Major depressive disorder, recurrent severe without psychotic features: Secondary | ICD-10-CM | POA: Insufficient documentation

## 2018-12-04 DIAGNOSIS — F17210 Nicotine dependence, cigarettes, uncomplicated: Secondary | ICD-10-CM | POA: Insufficient documentation

## 2018-12-04 DIAGNOSIS — F1010 Alcohol abuse, uncomplicated: Secondary | ICD-10-CM

## 2018-12-04 DIAGNOSIS — R45851 Suicidal ideations: Secondary | ICD-10-CM

## 2018-12-04 DIAGNOSIS — Z7982 Long term (current) use of aspirin: Secondary | ICD-10-CM | POA: Insufficient documentation

## 2018-12-04 DIAGNOSIS — F1020 Alcohol dependence, uncomplicated: Secondary | ICD-10-CM | POA: Insufficient documentation

## 2018-12-04 DIAGNOSIS — F1410 Cocaine abuse, uncomplicated: Secondary | ICD-10-CM

## 2018-12-04 DIAGNOSIS — E119 Type 2 diabetes mellitus without complications: Secondary | ICD-10-CM | POA: Insufficient documentation

## 2018-12-04 DIAGNOSIS — Z79899 Other long term (current) drug therapy: Secondary | ICD-10-CM | POA: Insufficient documentation

## 2018-12-04 DIAGNOSIS — R440 Auditory hallucinations: Secondary | ICD-10-CM | POA: Insufficient documentation

## 2018-12-04 LAB — CBC
HCT: 48.1 % (ref 39.0–52.0)
Hemoglobin: 15.9 g/dL (ref 13.0–17.0)
MCH: 33.5 pg (ref 26.0–34.0)
MCHC: 33.1 g/dL (ref 30.0–36.0)
MCV: 101.5 fL — ABNORMAL HIGH (ref 80.0–100.0)
Platelets: 223 10*3/uL (ref 150–400)
RBC: 4.74 MIL/uL (ref 4.22–5.81)
RDW: 12.1 % (ref 11.5–15.5)
WBC: 11 10*3/uL — ABNORMAL HIGH (ref 4.0–10.5)
nRBC: 0 % (ref 0.0–0.2)

## 2018-12-04 LAB — COMPREHENSIVE METABOLIC PANEL
ALT: 21 U/L (ref 0–44)
AST: 22 U/L (ref 15–41)
Albumin: 4.7 g/dL (ref 3.5–5.0)
Alkaline Phosphatase: 82 U/L (ref 38–126)
Anion gap: 16 — ABNORMAL HIGH (ref 5–15)
BUN: 7 mg/dL (ref 6–20)
CALCIUM: 9.1 mg/dL (ref 8.9–10.3)
CO2: 20 mmol/L — ABNORMAL LOW (ref 22–32)
Chloride: 99 mmol/L (ref 98–111)
Creatinine, Ser: 0.51 mg/dL — ABNORMAL LOW (ref 0.61–1.24)
GFR calc Af Amer: >60 mL/min (ref >60)
GFR calc non Af Amer: >60 mL/min (ref >60)
Glucose, Bld: 105 mg/dL — ABNORMAL HIGH (ref 70–99)
Potassium: 3.6 mmol/L (ref 3.5–5.1)
Sodium: 135 mmol/L (ref 135–145)
Total Bilirubin: 0.4 mg/dL (ref 0.3–1.2)
Total Protein: 8 g/dL (ref 6.5–8.1)

## 2018-12-04 LAB — RAPID URINE DRUG SCREEN, HOSP PERFORMED
Amphetamines: NOT DETECTED
Barbiturates: NOT DETECTED
Benzodiazepines: POSITIVE — AB
Cocaine: POSITIVE — AB
Opiates: NOT DETECTED
Tetrahydrocannabinol: NOT DETECTED

## 2018-12-04 LAB — SALICYLATE LEVEL: Salicylate Lvl: <7 mg/dL (ref 2.8–30.0)

## 2018-12-04 LAB — ETHANOL: Alcohol, Ethyl (B): 166 mg/dL — ABNORMAL HIGH (ref <10)

## 2018-12-04 LAB — ACETAMINOPHEN LEVEL: Acetaminophen (Tylenol), Serum: <10 ug/mL — ABNORMAL LOW (ref 10–30)

## 2018-12-04 LAB — CBG MONITORING, ED: Glucose-Capillary: 117 mg/dL — ABNORMAL HIGH (ref 70–99)

## 2018-12-04 MED ORDER — SERTRALINE HCL 50 MG PO TABS
75.0000 mg | ORAL_TABLET | Freq: Every day | ORAL | Status: DC
  Administered 2018-12-04: 25 mg via ORAL
  Filled 2018-12-04: qty 2

## 2018-12-04 MED ORDER — LORAZEPAM 2 MG/ML IJ SOLN: 0.0000 mg | Freq: Two times a day (BID) | INTRAMUSCULAR | Status: DC

## 2018-12-04 MED ORDER — TICAGRELOR 90 MG PO TABS
90.0000 mg | ORAL_TABLET | Freq: Two times a day (BID) | ORAL | Status: DC
  Administered 2018-12-04 (×2): 90 mg via ORAL
  Filled 2018-12-04 (×2): qty 1

## 2018-12-04 MED ORDER — THIAMINE HCL 100 MG/ML IJ SOLN: 100.0000 mg | Freq: Every day | INTRAMUSCULAR | Status: DC

## 2018-12-04 MED ORDER — LORAZEPAM 1 MG PO TABS
1.0000 mg | ORAL_TABLET | Freq: Four times a day (QID) | ORAL | Status: DC
  Administered 2018-12-04: 1 mg via ORAL
  Filled 2018-12-04 (×2): qty 1

## 2018-12-04 MED ORDER — LORAZEPAM 1 MG PO TABS: 1.0000 mg | ORAL_TABLET | Freq: Three times a day (TID) | ORAL | Status: DC

## 2018-12-04 MED ORDER — ADULT MULTIVITAMIN W/MINERALS CH
1.0000 | ORAL_TABLET | Freq: Every day | ORAL | Status: DC
  Administered 2018-12-04: 1 via ORAL
  Filled 2018-12-04: qty 1

## 2018-12-04 MED ORDER — ATORVASTATIN CALCIUM 80 MG PO TABS
80.0000 mg | ORAL_TABLET | Freq: Every day | ORAL | Status: DC
  Administered 2018-12-04: 80 mg via ORAL
  Filled 2018-12-04: qty 1

## 2018-12-04 MED ORDER — LORAZEPAM 2 MG/ML IJ SOLN: 0.0000 mg | Freq: Four times a day (QID) | INTRAMUSCULAR | Status: DC

## 2018-12-04 MED ORDER — METOPROLOL TARTRATE 25 MG PO TABS
50.0000 mg | ORAL_TABLET | Freq: Two times a day (BID) | ORAL | Status: DC
  Administered 2018-12-04 (×2): 50 mg via ORAL
  Filled 2018-12-04 (×2): qty 2

## 2018-12-04 MED ORDER — VITAMIN B-1 100 MG PO TABS
100.0000 mg | ORAL_TABLET | Freq: Every day | ORAL | Status: DC
  Administered 2018-12-04: 100 mg via ORAL
  Filled 2018-12-04 (×2): qty 1

## 2018-12-04 MED ORDER — LORAZEPAM 1 MG PO TABS
1.0000 mg | ORAL_TABLET | Freq: Four times a day (QID) | ORAL | Status: DC | PRN
  Administered 2018-12-04 – 2018-12-05 (×2): 1 mg via ORAL
  Filled 2018-12-04: qty 1

## 2018-12-04 MED ORDER — METFORMIN HCL 500 MG PO TABS
500.0000 mg | ORAL_TABLET | Freq: Two times a day (BID) | ORAL | Status: DC
  Administered 2018-12-04: 500 mg via ORAL
  Filled 2018-12-04: qty 1

## 2018-12-04 MED ORDER — LORAZEPAM 1 MG PO TABS: 0.0000 mg | ORAL_TABLET | Freq: Four times a day (QID) | ORAL | Status: DC

## 2018-12-04 MED ORDER — LORAZEPAM 1 MG PO TABS: 0.0000 mg | ORAL_TABLET | Freq: Two times a day (BID) | ORAL | Status: DC

## 2018-12-04 MED ORDER — ONDANSETRON 4 MG PO TBDP: 4.0000 mg | ORAL_TABLET | Freq: Four times a day (QID) | ORAL | Status: DC | PRN

## 2018-12-04 MED ORDER — HYDROXYZINE HCL 25 MG PO TABS: 25.0000 mg | ORAL_TABLET | Freq: Four times a day (QID) | ORAL | Status: DC | PRN

## 2018-12-04 MED ORDER — VITAMIN B-1 100 MG PO TABS: 100.0000 mg | ORAL_TABLET | Freq: Every day | ORAL | Status: DC

## 2018-12-04 MED ORDER — ISOSORBIDE MONONITRATE ER 30 MG PO TB24
30.0000 mg | ORAL_TABLET | Freq: Every day | ORAL | Status: DC
  Administered 2018-12-04: 30 mg via ORAL
  Filled 2018-12-04: qty 1

## 2018-12-04 MED ORDER — LORAZEPAM 1 MG PO TABS: 1.0000 mg | ORAL_TABLET | Freq: Two times a day (BID) | ORAL | Status: DC

## 2018-12-04 MED ORDER — PANTOPRAZOLE SODIUM 40 MG PO TBEC
40.0000 mg | DELAYED_RELEASE_TABLET | Freq: Every day | ORAL | Status: DC
  Administered 2018-12-04: 40 mg via ORAL
  Filled 2018-12-04: qty 1

## 2018-12-04 MED ORDER — THIAMINE HCL 100 MG/ML IJ SOLN: 100.0000 mg | Freq: Once | INTRAMUSCULAR | Status: DC

## 2018-12-04 MED ORDER — ASPIRIN EC 81 MG PO TBEC
81.0000 mg | DELAYED_RELEASE_TABLET | Freq: Every day | ORAL | Status: DC
  Administered 2018-12-04: 81 mg via ORAL
  Filled 2018-12-04: qty 1

## 2018-12-04 MED ORDER — LOPERAMIDE HCL 2 MG PO CAPS: 2.0000 mg | ORAL_CAPSULE | ORAL | Status: DC | PRN

## 2018-12-04 MED ORDER — LORAZEPAM 1 MG PO TABS: 1.0000 mg | ORAL_TABLET | Freq: Every day | ORAL | Status: DC

## 2018-12-04 MED ORDER — LISINOPRIL 10 MG PO TABS
5.0000 mg | ORAL_TABLET | Freq: Every day | ORAL | Status: DC
  Administered 2018-12-04: 5 mg via ORAL
  Filled 2018-12-04: qty 1

## 2018-12-04 NOTE — ED Notes (Signed)
Bed: WLPT4 Expected date:  Expected time:  Means of arrival:  Comments: 

## 2018-12-04 NOTE — ED Notes (Signed)
Bed: WBH40 Expected date:  Expected time:  Means of arrival:  Comments: 

## 2018-12-04 NOTE — ED Notes (Addendum)
Received Warren Salas this PM after shift change coming from the bathroom. He endorsed feeling suicidal with a plan he is not willing to verbalize. He denied feeling HI. He feels safe here on the unit.

## 2018-12-04 NOTE — ED Provider Notes (Signed)
[facility]-EMERGENCY DEPT Provider Note   CSN: [id number] Arrival date & time: 12/04/18  1120     History   Chief Complaint Chief Complaint  Patient presents with  . Suicidal    HPI Warren Salas is a 57 y.o. male.  HPI  Pt was seen at 1150.  Per Police and pt report: Police were called by bystander stating a man [place]. Pt states he could not get his leg over the railing, but would have jumped if he could. Endorses etoh use this morning, as well as hearing voices telling him to kill himself. Denies HI. The symptoms have been associated with no other complaints. The patient has a significant history of similar symptoms previously, recently being evaluated for this complaint yesterday.     Past Medical History:  Diagnosis Date  . Back pain   . COPD (chronic obstructive pulmonary disease) (HCC)   . Diabetes mellitus without complication (HCC)   . Hypertension   . STEMI (ST elevation myocardial infarction) (HCC) 10/2017    Patient Active Problem List   Diagnosis Date Noted  . Moderate cocaine use disorder (HCC) 11/16/2018  . MDD (major depressive disorder), recurrent severe, without psychosis (HCC) 10/27/2018  . Alcohol dependence with alcohol-induced mood disorder (HCC)   . CAD (coronary artery disease) 12/12/2017  . Old MI (myocardial infarction) 12/12/2017  . Tobacco abuse 12/12/2017  . Type 2 diabetes mellitus with complication, without long-term current use of insulin (HCC) 12/12/2017  . Acute MI, inferior wall (HCC) 11/01/2017  . Acute inferior myocardial infarction [facility](HCC)     Past Surgical History:  Procedure Laterality Date  . APPENDECTOMY    . CORONARY STENT INTERVENTION N/A 12/29/2017   Procedure: CORONARY STENT INTERVENTION - LAD;  Surgeon: Corky CraftsVaranasi, Jayadeep S, MD;  Location: [facility] INVASIVE CV LAB;  Service: Cardiovascular;  Laterality: N/A;  . CORONARY/GRAFT ACUTE MI REVASCULARIZATION N/A  11/01/2017   Procedure: Coronary/Graft Acute MI Revascularization;  Surgeon: Corky CraftsVaranasi, Jayadeep S, MD;  [facility];  Service: Cardiovascular;  Laterality: N/A;  . LEFT HEART CATH AND CORONARY ANGIOGRAPHY N/A 11/01/2017   Procedure: LEFT HEART CATH AND CORONARY ANGIOGRAPHY;  Surgeon: Corky CraftsVaranasi, Jayadeep S, MD;  [facility];  Service: Cardiovascular;  Laterality: N/A;        Home Medications    Prior to Admission medications   Medication Sig Start Date End Date Taking? Authorizing Provider  albuterol (PROVENTIL HFA;VENTOLIN HFA) 108 (90 Base) MCG/ACT inhaler Inhale 1-2 puffs into the lungs every 6 (six) hours as needed for wheezing. 11/22/18   Micheal Likensainville, Christopher T, MD  aspirin EC 81 MG tablet Take 1 tablet (81 mg total) by mouth daily. 11/22/18   Micheal Likensainville, Christopher T, MD  atorvastatin (LIPITOR) 80 MG tablet Take 1 tablet (80 mg total) by mouth daily at 6 PM. 11/22/18   [place], Burlene Arnthristopher T, MD  budesonide-formoterol (SYMBICORT) 160-4.5 MCG/ACT inhaler Inhale 2 puffs into the lungs 2 (two) times daily as needed (For shortness of breath.). 11/22/18   Micheal Likensainville, Christopher T, MD  hydrOXYzine (ATARAX/VISTARIL) 50 MG tablet Take 1 tablet (50 mg total) by mouth every 6 (six) hours as needed for anxiety. 11/22/18   Micheal Likensainville, Christopher T, MD  isosorbide mononitrate (IMDUR) 30 MG 24 hr tablet Take 1 tablet (30 mg total) by mouth daily. 11/22/18   Micheal Likensainville, Christopher T, MD  lisinopril (PRINIVIL,ZESTRIL) 10 MG tablet Take 0.5 tablets (5 mg total) by mouth daily.  11/22/18   Micheal Likens, MD  metFORMIN (GLUCOPHAGE) 500 MG tablet Take 1 tablet (500 mg total) by mouth 2 (two) times daily. 11/22/18   Micheal Likens, MD  metoprolol tartrate (LOPRESSOR) 50 MG tablet Take 1 tablet (50 mg total) by mouth 2 (two) times daily. 11/22/18   Micheal Likens, MD  nitroGLYCERIN (NITROSTAT) 0.4 MG SL tablet Place 1 tablet (0.4 mg total) under  the tongue every 5 (five) minutes as needed. 11/22/18   Micheal Likens, MD  omeprazole (PRILOSEC) 20 MG capsule Take 1 capsule (20 mg total) by mouth daily. Patient not taking: Reported on 12/03/2018 11/22/18   Micheal Likens, MD  sertraline (ZOLOFT) 50 MG tablet Take 1.5 tablets (75 mg total) by mouth daily. 11/23/18   Micheal Likens, MD  ticagrelor (BRILINTA) 90 MG TABS tablet Take 1 tablet (90 mg total) by mouth 2 (two) times daily. 11/22/18   Micheal Likens, MD    Family History Family History  Problem Relation Age of Onset  . Hypertension Mother   . Diabetes Mother   . Hypertension Father   . Diabetes Father     Social History Social History   Tobacco Use  . Smoking status: Current Every Day Smoker    Packs/day: 1.00    Types: Cigarettes  . Smokeless tobacco: Never Used  Substance Use Topics  . Alcohol use: Yes    Alcohol/week: 12.0 standard drinks    Types: 12 Cans of beer per week  . Drug use: Yes    Types: Cocaine    Comment: last use 3-4 days ago.     Allergies   Patient has no known allergies.   Review of Systems Review of Systems ROS: Statement: All systems negative except as marked or noted in the HPI; Constitutional: Negative for fever and chills. ; ; Eyes: Negative for eye pain, redness and discharge. ; ; ENMT: Negative for ear pain, hoarseness, nasal congestion, sinus pressure and sore throat. ; ; Cardiovascular: Negative for chest pain, palpitations, diaphoresis, dyspnea and peripheral edema. ; ; Respiratory: Negative for cough, wheezing and stridor. ; ; Gastrointestinal: Negative for nausea, vomiting, diarrhea, abdominal pain, blood in stool, hematemesis, jaundice and rectal bleeding. . ; ; Genitourinary: Negative for dysuria, flank pain and hematuria. ; ; Musculoskeletal: Negative for back pain and neck pain. Negative for swelling and trauma.; ; Skin: Negative for pruritus, rash, abrasions, blisters, bruising and skin  lesion.; ; Neuro: Negative for headache, lightheadedness and neck stiffness. Negative for weakness, altered level of consciousness, altered mental status, extremity weakness, paresthesias, involuntary movement, seizure and syncope.; Psych:  +SI. No HI. +auditory hallucinations.       Physical Exam Updated Vital Signs BP 127/81 (BP Location: Left Arm)   Pulse 98   Temp 97.7 F (36.5 C) (Oral)   Resp 18   Ht 5\' 6"  (1.676 m)   Wt 69.4 kg   SpO2 97%   BMI 24.69 kg/m   Physical Exam 1155: Physical examination:  Nursing notes reviewed; Vital signs and O2 SAT reviewed;  Constitutional: Well developed, Well nourished, Well hydrated, In no acute distress; Head:  Normocephalic, atraumatic; Eyes: EOMI, PERRL, No scleral icterus; ENMT: Mouth and pharynx normal, Mucous membranes moist; Neck: Supple, Full range of motion; Cardiovascular: Regular rate and rhythm; Respiratory: Breath sounds clear, No wheezes.  Speaking full sentences with ease, Normal respiratory effort/excursion; Chest: No deformity, Movement normal; Abdomen: Nondistended; Extremities: No deformity.; Neuro: AA&Ox3, Major CN grossly intact.  Speech clear.  No gross focal motor deficits in extremities. Climbs on and off stretcher easily by himself. Gait steady.; Skin: Color normal, Warm, Dry.; Psych:  Endorses SI.     ED Treatments / Results  Labs (all labs ordered are listed, but only abnormal results are displayed)   EKG None  Radiology   Procedures Procedures (including critical care time)  Medications Ordered in ED Medications - No data to display   Initial Impression / Assessment and Plan / ED Course  I have reviewed the triage vital signs and the nursing notes.  Pertinent labs & imaging results that were available during my care of the patient were reviewed by me and considered in my medical decision making (see chart for details).  MDM Reviewed: previous chart, nursing note and vitals Reviewed previous:  labs Interpretation: labs   Results for orders placed or performed during the hospital encounter of 12/04/18  Comprehensive metabolic panel  Result Value Ref Range   Sodium 135 135 - 145 mmol/L   Potassium 3.6 3.5 - 5.1 mmol/L   Chloride 99 98 - 111 mmol/L   CO2 20 (L) 22 - 32 mmol/L   Glucose, Bld 105 (H) 70 - 99 mg/dL   BUN 7 6 - 20 mg/dL   Creatinine, Ser 1.61 (L) 0.61 - 1.24 mg/dL   Calcium 9.1 8.9 - 09.6 mg/dL   Total Protein 8.0 6.5 - 8.1 g/dL   Albumin 4.7 3.5 - 5.0 g/dL   AST 22 15 - 41 U/L   ALT 21 0 - 44 U/L   Alkaline Phosphatase 82 38 - 126 U/L   Total Bilirubin 0.4 0.3 - 1.2 mg/dL   GFR calc non Af Amer >60 >60 mL/min   GFR calc Af Amer >60 >60 mL/min   Anion gap 16 (H) 5 - 15  Ethanol  Result Value Ref Range   Alcohol, Ethyl (B) 166 (H) <10 mg/dL  Salicylate level  Result Value Ref Range   Salicylate Lvl <7.0 2.8 - 30.0 mg/dL  Acetaminophen level  Result Value Ref Range   Acetaminophen (Tylenol), Serum <10 (L) 10 - 30 ug/mL  cbc  Result Value Ref Range   WBC 11.0 (H) 4.0 - 10.5 K/uL   RBC 4.74 4.22 - 5.81 MIL/uL   Hemoglobin 15.9 13.0 - 17.0 g/dL   HCT 04.5 40.9 - 81.1 %   MCV 101.5 (H) 80.0 - 100.0 fL   MCH 33.5 26.0 - 34.0 pg   MCHC 33.1 30.0 - 36.0 g/dL   RDW 91.4 78.2 - 95.6 %   Platelets 223 150 - 400 K/uL   nRBC 0.0 0.0 - 0.2 %    1420:  CIWA protocol ordered. UDS yesterday +cocaine. Will have TTS evaluate.     Final Clinical Impressions(s) / ED Diagnoses   Final diagnoses:  None    ED Discharge Orders    None       Samuel Jester, DO 12/04/18 1508

## 2018-12-04 NOTE — ED Triage Notes (Signed)
Patient is voluntary. [facility]  A bystander called police saying a man [place]. Patient reports that he could not get his leg over the railing, but would have jumped if he could. Patient states he has tried before.  Patient states he last drank 2 hours ago-a 40 ounce. Patient denies any drug use or HI. Patient states he is hearing voices that tell him to kill himself.

## 2018-12-04 NOTE — BH Assessment (Signed)
[facility]   12/04/18: 1745 Patient is still observed to be sleeping.

## 2018-12-04 NOTE — ED Notes (Signed)
Pt oriented to room and unit.  Pt c/o suicidal thoughts and severe anxiety.  Pt is withdrawn and irritable. 15 minute checks and video monitoring in place.

## 2018-12-05 ENCOUNTER — Inpatient Hospital Stay (HOSPITAL_COMMUNITY)
Admission: AD | Admit: 2018-12-05 | Discharge: 2018-12-07 | DRG: 885 | Disposition: A | Discharge status: Home / Self Care | Payer: Medicare Other | Source: Intra-hospital | Attending: Psychiatry | Admitting: Psychiatry

## 2018-12-05 ENCOUNTER — Encounter (HOSPITAL_COMMUNITY): Payer: Self-pay

## 2018-12-05 DIAGNOSIS — I2510 Atherosclerotic heart disease of native coronary artery without angina pectoris: Secondary | ICD-10-CM | POA: Diagnosis present

## 2018-12-05 DIAGNOSIS — Z7982 Long term (current) use of aspirin: Secondary | ICD-10-CM | POA: Diagnosis not present

## 2018-12-05 DIAGNOSIS — E119 Type 2 diabetes mellitus without complications: Secondary | ICD-10-CM | POA: Diagnosis not present

## 2018-12-05 DIAGNOSIS — F333 Major depressive disorder, recurrent, severe with psychotic symptoms: Secondary | ICD-10-CM | POA: Diagnosis present

## 2018-12-05 DIAGNOSIS — F1490 Cocaine use, unspecified, uncomplicated: Secondary | ICD-10-CM | POA: Diagnosis not present

## 2018-12-05 DIAGNOSIS — F332 Major depressive disorder, recurrent severe without psychotic features: Secondary | ICD-10-CM | POA: Diagnosis present

## 2018-12-05 DIAGNOSIS — F17210 Nicotine dependence, cigarettes, uncomplicated: Secondary | ICD-10-CM | POA: Diagnosis present

## 2018-12-05 DIAGNOSIS — E1165 Type 2 diabetes mellitus with hyperglycemia: Secondary | ICD-10-CM | POA: Diagnosis present

## 2018-12-05 DIAGNOSIS — Z7951 Long term (current) use of inhaled steroids: Secondary | ICD-10-CM | POA: Diagnosis not present

## 2018-12-05 DIAGNOSIS — F10920 Alcohol use, unspecified with intoxication, uncomplicated: Secondary | ICD-10-CM | POA: Diagnosis present

## 2018-12-05 DIAGNOSIS — I252 Old myocardial infarction: Secondary | ICD-10-CM | POA: Diagnosis not present

## 2018-12-05 DIAGNOSIS — R450 Nervousness: Secondary | ICD-10-CM

## 2018-12-05 DIAGNOSIS — K219 Gastro-esophageal reflux disease without esophagitis: Secondary | ICD-10-CM | POA: Diagnosis present

## 2018-12-05 DIAGNOSIS — Z79899 Other long term (current) drug therapy: Secondary | ICD-10-CM | POA: Diagnosis not present

## 2018-12-05 DIAGNOSIS — Z955 Presence of coronary angioplasty implant and graft: Secondary | ICD-10-CM

## 2018-12-05 DIAGNOSIS — Z590 Homelessness: Secondary | ICD-10-CM | POA: Diagnosis not present

## 2018-12-05 DIAGNOSIS — Y906 Blood alcohol level of 120-199 mg/100 ml: Secondary | ICD-10-CM | POA: Diagnosis present

## 2018-12-05 DIAGNOSIS — Z7984 Long term (current) use of oral hypoglycemic drugs: Secondary | ICD-10-CM | POA: Diagnosis not present

## 2018-12-05 DIAGNOSIS — F10129 Alcohol abuse with intoxication, unspecified: Secondary | ICD-10-CM | POA: Diagnosis present

## 2018-12-05 DIAGNOSIS — J449 Chronic obstructive pulmonary disease, unspecified: Secondary | ICD-10-CM | POA: Diagnosis present

## 2018-12-05 DIAGNOSIS — E7800 Pure hypercholesterolemia, unspecified: Secondary | ICD-10-CM | POA: Diagnosis present

## 2018-12-05 DIAGNOSIS — I10 Essential (primary) hypertension: Secondary | ICD-10-CM | POA: Diagnosis present

## 2018-12-05 MED ORDER — TICAGRELOR 90 MG PO TABS
90.0000 mg | ORAL_TABLET | Freq: Two times a day (BID) | ORAL | Status: DC
  Administered 2018-12-05 – 2018-12-07 (×5): 90 mg via ORAL
  Filled 2018-12-05 (×9): qty 1

## 2018-12-05 MED ORDER — ASPIRIN EC 81 MG PO TBEC
81.0000 mg | DELAYED_RELEASE_TABLET | Freq: Every day | ORAL | Status: DC
  Administered 2018-12-05 – 2018-12-07 (×3): 81 mg via ORAL
  Filled 2018-12-05 (×5): qty 1

## 2018-12-05 MED ORDER — METOPROLOL TARTRATE 50 MG PO TABS
50.0000 mg | ORAL_TABLET | Freq: Two times a day (BID) | ORAL | Status: DC
  Administered 2018-12-05 – 2018-12-07 (×5): 50 mg via ORAL
  Filled 2018-12-05 (×9): qty 1

## 2018-12-05 MED ORDER — NICOTINE 21 MG/24HR TD PT24
21.0000 mg | MEDICATED_PATCH | Freq: Every day | TRANSDERMAL | Status: DC
  Administered 2018-12-05 – 2018-12-07 (×3): 21 mg via TRANSDERMAL
  Filled 2018-12-05 (×5): qty 1

## 2018-12-05 MED ORDER — ATORVASTATIN CALCIUM 80 MG PO TABS
80.0000 mg | ORAL_TABLET | Freq: Every day | ORAL | Status: DC
  Administered 2018-12-05 – 2018-12-06 (×2): 80 mg via ORAL
  Filled 2018-12-05 (×4): qty 1

## 2018-12-05 MED ORDER — SERTRALINE HCL 100 MG PO TABS
100.0000 mg | ORAL_TABLET | Freq: Every day | ORAL | Status: DC
  Administered 2018-12-06 – 2018-12-07 (×2): 100 mg via ORAL
  Filled 2018-12-05 (×4): qty 1

## 2018-12-05 MED ORDER — PANTOPRAZOLE SODIUM 40 MG PO TBEC
40.0000 mg | DELAYED_RELEASE_TABLET | Freq: Every day | ORAL | Status: DC
  Administered 2018-12-05 – 2018-12-07 (×3): 40 mg via ORAL
  Filled 2018-12-05 (×5): qty 1

## 2018-12-05 MED ORDER — LORAZEPAM 1 MG PO TABS
1.0000 mg | ORAL_TABLET | Freq: Three times a day (TID) | ORAL | Status: AC
  Administered 2018-12-06 (×3): 1 mg via ORAL
  Filled 2018-12-05 (×3): qty 1

## 2018-12-05 MED ORDER — ISOSORBIDE MONONITRATE ER 30 MG PO TB24
30.0000 mg | ORAL_TABLET | Freq: Every day | ORAL | Status: DC
  Administered 2018-12-05 – 2018-12-07 (×3): 30 mg via ORAL
  Filled 2018-12-05 (×5): qty 1

## 2018-12-05 MED ORDER — METFORMIN HCL 500 MG PO TABS
500.0000 mg | ORAL_TABLET | Freq: Two times a day (BID) | ORAL | Status: DC
  Administered 2018-12-05 – 2018-12-07 (×5): 500 mg via ORAL
  Filled 2018-12-05 (×9): qty 1

## 2018-12-05 MED ORDER — LORAZEPAM 1 MG PO TABS
1.0000 mg | ORAL_TABLET | Freq: Four times a day (QID) | ORAL | Status: AC
  Administered 2018-12-05 (×3): 1 mg via ORAL
  Filled 2018-12-05 (×3): qty 1

## 2018-12-05 MED ORDER — LORAZEPAM 1 MG PO TABS: 1.0000 mg | ORAL_TABLET | Freq: Every day | ORAL | Status: DC

## 2018-12-05 MED ORDER — HYDROXYZINE HCL 25 MG PO TABS
25.0000 mg | ORAL_TABLET | Freq: Four times a day (QID) | ORAL | Status: DC | PRN
  Filled 2018-12-05: qty 1

## 2018-12-05 MED ORDER — LORAZEPAM 1 MG PO TABS
1.0000 mg | ORAL_TABLET | Freq: Two times a day (BID) | ORAL | Status: DC
  Administered 2018-12-07: 1 mg via ORAL
  Filled 2018-12-05: qty 1

## 2018-12-05 MED ORDER — SERTRALINE HCL 50 MG PO TABS
75.0000 mg | ORAL_TABLET | Freq: Every day | ORAL | Status: DC
  Filled 2018-12-05 (×2): qty 1

## 2018-12-05 MED ORDER — ONDANSETRON 4 MG PO TBDP: 4.0000 mg | ORAL_TABLET | Freq: Four times a day (QID) | ORAL | Status: DC | PRN

## 2018-12-05 MED ORDER — VITAMIN B-1 100 MG PO TABS
100.0000 mg | ORAL_TABLET | Freq: Every day | ORAL | Status: DC
  Administered 2018-12-06 – 2018-12-07 (×2): 100 mg via ORAL
  Filled 2018-12-05 (×5): qty 1

## 2018-12-05 MED ORDER — LISINOPRIL 5 MG PO TABS
5.0000 mg | ORAL_TABLET | Freq: Every day | ORAL | Status: DC
  Administered 2018-12-05 – 2018-12-07 (×3): 5 mg via ORAL
  Filled 2018-12-05 (×5): qty 1

## 2018-12-05 MED ORDER — ALUM & MAG HYDROXIDE-SIMETH 200-200-20 MG/5ML PO SUSP: 30.0000 mL | ORAL | Status: DC | PRN

## 2018-12-05 MED ORDER — LORAZEPAM 1 MG PO TABS: 1.0000 mg | ORAL_TABLET | Freq: Four times a day (QID) | ORAL | Status: DC | PRN

## 2018-12-05 MED ORDER — MAGNESIUM HYDROXIDE 400 MG/5ML PO SUSP: 30.0000 mL | Freq: Every day | ORAL | Status: DC | PRN

## 2018-12-05 MED ORDER — ADULT MULTIVITAMIN W/MINERALS CH
1.0000 | ORAL_TABLET | Freq: Every day | ORAL | Status: DC
  Administered 2018-12-05 – 2018-12-07 (×3): 1 via ORAL
  Filled 2018-12-05 (×5): qty 1

## 2018-12-05 MED ORDER — ACETAMINOPHEN 325 MG PO TABS: 650.0000 mg | ORAL_TABLET | Freq: Four times a day (QID) | ORAL | Status: DC | PRN

## 2018-12-05 MED ORDER — LOPERAMIDE HCL 2 MG PO CAPS: 2.0000 mg | ORAL_CAPSULE | ORAL | Status: DC | PRN

## 2018-12-05 NOTE — ED Notes (Signed)
[facility] AC Kim made aware of pt pick up time of after 0430. [facility] at [employer].

## 2018-12-05 NOTE — BH Assessment (Signed)
[facility] Assessment Progress Note  [facility] to Dr. Jola Babinskilary.  Clinician let nurse Melina SchoolsRobyn know that patient can come before 04:00.  Melina SchoolsRobyn will let Dr. Lynelle DoctorKnapp know.  [facility] to transport.  Voluntary admission papers to be faxed to [phone] prior to arrival.

## 2018-12-05 NOTE — Progress Notes (Signed)
Psychoeducational Group Note  Date:  12/05/2018 Time:  2312  Group Topic/Focus:  Wrap-Up Group:   The focus of this group is to help patients review their daily goal of treatment and discuss progress on daily workbooks.  Participation Level: Did Not Attend  Participation Quality:  Not Applicable  Affect:  Not Applicable  Cognitive:  Not Applicable  Insight:  Not Applicable  Engagement in Group: Not Applicable  Additional Comments:  The patient did not attend group this evening since he felt dizzy.   Joseantonio Dittmar S 12/05/2018, 11:12 PM

## 2018-12-05 NOTE — ED Provider Notes (Signed)
Patient accepted at behavioral health.   Warren Salas, Warren Liebert, MD 12/05/18 [phone]

## 2018-12-05 NOTE — H&P (Signed)
Psychiatric Admission Assessment Adult  Patient Identification: Warren Salas MRN:  [id number] Date of Evaluation:  12/05/2018 Chief Complaint:  MDD recurrent severe ETOH recurrent severe Principal Diagnosis: <principal problem not specified> Diagnosis:  Active Problems:   Severe recurrent major depression w/psychotic features, mood-congruent (HCC)  History of Present Illness:   This is a repeat admission for Warren Salas 57 year old homeless individual who was admitted in both October and November of this year, each time with alcohol intoxication and threats of self-harm  Once again patient presents intoxicated, with a blood alcohol level that is 166, drug screen showing benzodiazepines and cocaine that we denied abusing benzodiazepines stating he was prescribed something "for his nerves" that he cannot recall what it is.  States he is suicidal because he [place] the [place] in a [place] and did not take his antidepressants.  Basically came to our attention because [name] was called as he was standing on the side of the road and bystanders were concerned.  Complained of chest pain with EMS but did not comply with treatment then he presented emergency department with a chief complaint of chest pain and then elaborated he just wanted to be "out of this world" again was given aspirin but spit it out was not cooperative with EMS or ER staff/gave verbal reports the note of 9:20 AM on 12/8 indicated he was not in fact suicidal but at any rate there was concern the bystanders thought [place] might [place]. He also elaborated other examiners in the emergency department that he heard voices telling him to kill himself but he denied this to me it may be simply issues of secondary gain propelling symptom exaggeration at any rate he is admitted for detox and stabilization.  States he is been detox before with "meds" that worked but he cannot recall what they were he is  already been started on the lorazepam protocol this blood pressure stable he has no tremors at this point.  He denies history of seizures or DTs or hallucinations when coming off of alcohol  Other diagnoses include history of depression recurrent severe without psychosis/alcohol dependence with alcohol induced mood disorder/history of MI/history of type 2 diabetes/coronary disease/cocaine abuse and dependence in the past/hypertension  Associated Signs/Symptoms: Depression Symptoms:  psychomotor retardation, (Hypo) Manic Symptoms:  Labiality of Mood, Anxiety Symptoms:  Panic Symptoms, Psychotic Symptoms:  Hallucinations: Auditory PTSD Symptoms: NA Total Time spent with patient: 45 minutes  Past Psychiatric History: As above is pretty extensive reports of symptoms have been variable over the last 48 hours  Is the patient at risk to self? Yes.    Has the patient been a risk to self in the past 6 months? Yes.    Has the patient been a risk to self within the distant past? Yes.    Is the patient a risk to others? No.  Has the patient been a risk to others in the past 6 months? No.  Has the patient been a risk to others within the distant past? No.   Prior Inpatient Therapy:   Prior Outpatient Therapy:    Alcohol Screening: 1. How often do you have a drink containing alcohol?: 4 or more times a week 2. How many drinks containing alcohol do you have on a typical day when you are drinking?: 10 or more 3. How often do you have six or more drinks on one occasion?: Daily or almost daily AUDIT-C Score: 12 4. How often during  the last year have you found that you were not able to stop drinking once you had started?: Daily or almost daily 5. How often during the last year have you failed to do what was normally expected from you becasue of drinking?: Weekly 6. How often during the last year have you needed a first drink in the morning to get yourself going after a heavy drinking session?: Weekly 7.  How often during the last year have you had a feeling of guilt of remorse after drinking?: Weekly 8. How often during the last year have you been unable to remember what happened the night before because you had been drinking?: Weekly 9. Have you or someone else been injured as a result of your drinking?: No 10. Has a relative or friend or a doctor or another health worker been concerned about your drinking or suggested you cut down?: Yes, during the last year Alcohol Use Disorder Identification Test Final Score (AUDIT): 32 Intervention/Follow-up: Alcohol Education Substance Abuse History in the last 12 months:  Yes.   Consequences of Substance Abuse: Medical Consequences:  Substance-induced mood disorder/probable cocaine induced chest pain Previous Psychotropic Medications: Yes  Psychological Evaluations: No  Past Medical History:  Past Medical History:  Diagnosis Date  . Back pain   . COPD (chronic obstructive pulmonary disease) (HCC)   . Diabetes mellitus without complication (HCC)   . Hypertension   . STEMI (ST elevation myocardial infarction) (HCC) 10/2017    Past Surgical History:  Procedure Laterality Date  . APPENDECTOMY    . CORONARY STENT INTERVENTION N/A 12/29/2017   Procedure: CORONARY STENT INTERVENTION - LAD;  Surgeon: Corky Crafts, MD;  Location: [facility] INVASIVE CV LAB;  Service: Cardiovascular;  Laterality: N/A;  . CORONARY/GRAFT ACUTE MI REVASCULARIZATION N/A 11/01/2017   Procedure: Coronary/Graft Acute MI Revascularization;  Surgeon: Corky Crafts, MD;  Location: [facility] INVASIVE CV LAB;  Service: Cardiovascular;  Laterality: N/A;  . LEFT HEART CATH AND CORONARY ANGIOGRAPHY N/A 11/01/2017   Procedure: LEFT HEART CATH AND CORONARY ANGIOGRAPHY;  Surgeon: Corky Crafts, MD;  Location: [facility] INVASIVE CV LAB;  Service: Cardiovascular;  Laterality: N/A;   Family History:  Family History  Problem Relation Age of Onset  . Hypertension Mother   . Diabetes Mother   .  Hypertension Father   . Diabetes Father    Family Psychiatric  History: ukn Tobacco Screening: Have you used any form of tobacco in the last 30 days? (Cigarettes, Smokeless Tobacco, Cigars, and/or Pipes): Yes Tobacco use, Select all that apply: 5 or more cigarettes per day Are you interested in Tobacco Cessation Medications?: Yes, will notify MD for an order Counseled patient on smoking cessation including recognizing danger situations, developing coping skills and basic information about quitting provided: Refused/Declined practical counseling Social History:  Social History   Substance and Sexual Activity  Alcohol Use Yes  . Alcohol/week: 12.0 standard drinks  . Types: 12 Cans of beer per week     Social History   Substance and Sexual Activity  Drug Use Yes  . Types: Cocaine   Comment: last use 3-4 days ago.    Additional Social History:                           Allergies:  No Known Allergies Lab Results:  Results for orders placed or performed during the hospital encounter of 12/04/18 (from the past 48 hour(s))  Comprehensive metabolic panel     Status:  Abnormal   Collection Time: 12/04/18 11:46 AM  Result Value Ref Range   Sodium 135 135 - 145 mmol/L   Potassium 3.6 3.5 - 5.1 mmol/L   Chloride 99 98 - 111 mmol/L   CO2 20 (L) 22 - 32 mmol/L   Glucose, Bld 105 (H) 70 - 99 mg/dL   BUN 7 6 - 20 mg/dL   Creatinine, Ser 1.61 (L) 0.61 - 1.24 mg/dL   Calcium 9.1 8.9 - 09.6 mg/dL   Total Protein 8.0 6.5 - 8.1 g/dL   Albumin 4.7 3.5 - 5.0 g/dL   AST 22 15 - 41 U/L   ALT 21 0 - 44 U/L   Alkaline Phosphatase 82 38 - 126 U/L   Total Bilirubin 0.4 0.3 - 1.2 mg/dL   GFR calc non Af Amer >60 >60 mL/min   GFR calc Af Amer >60 >60 mL/min   Anion gap 16 (H) 5 - 15    Comment: Performed at [facility], [address] [place], [place] [address]  Ethanol     Status: Abnormal   Collection Time: 12/04/18 11:46 AM  Result Value Ref Range   Alcohol,  Ethyl (B) 166 (H) <10 mg/dL    Comment: (NOTE) Lowest detectable limit for serum alcohol is 10 mg/dL. For medical purposes only. Performed at [facility], [address] [place], [place] [address]   Salicylate level     Status: None   Collection Time: 12/04/18 11:46 AM  Result Value Ref Range   Salicylate Lvl <7.0 2.8 - 30.0 mg/dL    Comment: Performed at [facility], [address], [place], [place] [address]  Acetaminophen level     Status: Abnormal   Collection Time: 12/04/18 11:46 AM  Result Value Ref Range   Acetaminophen (Tylenol), Serum <10 (L) 10 - 30 ug/mL    Comment: (NOTE) Therapeutic concentrations vary significantly. A range of 10-30 ug/mL  may be an effective concentration for many patients. However, some  are best treated at concentrations outside of this range. Acetaminophen concentrations >150 ug/mL at 4 hours after ingestion  and >50 ug/mL at 12 hours after ingestion are often associated with  toxic reactions. Performed at [facility], [address] [place], [place] [address]   cbc     Status: Abnormal   Collection Time: 12/04/18 11:46 AM  Result Value Ref Range   WBC 11.0 (H) 4.0 - 10.5 K/uL   RBC 4.74 4.22 - 5.81 MIL/uL   Hemoglobin 15.9 13.0 - 17.0 g/dL   HCT 30.8 65.7 - 84.6 %   MCV 101.5 (H) 80.0 - 100.0 fL   MCH 33.5 26.0 - 34.0 pg   MCHC 33.1 30.0 - 36.0 g/dL   RDW 96.2 95.2 - 84.1 %   Platelets 223 150 - 400 K/uL   nRBC 0.0 0.0 - 0.2 %    Comment: Performed at [facility], [address], [place], [place] [address]  Rapid urine drug screen (hospital performed)     Status: Abnormal   Collection Time: 12/04/18  3:31 PM  Result Value Ref Range   Opiates NONE DETECTED NONE DETECTED   Cocaine POSITIVE (A) NONE DETECTED   Benzodiazepines POSITIVE (A) NONE DETECTED   Amphetamines NONE DETECTED NONE DETECTED   Tetrahydrocannabinol NONE DETECTED NONE DETECTED    Barbiturates NONE DETECTED NONE DETECTED    Comment: (NOTE) DRUG SCREEN FOR MEDICAL PURPOSES ONLY.  IF CONFIRMATION IS NEEDED FOR ANY PURPOSE,  NOTIFY LAB WITHIN 5 DAYS. LOWEST DETECTABLE LIMITS FOR URINE DRUG SCREEN Drug Class                     Cutoff (ng/mL) Amphetamine and metabolites    1000 Barbiturate and metabolites    200 Benzodiazepine                 200 Tricyclics and metabolites     300 Opiates and metabolites        300 Cocaine and metabolites        300 THC                            50 Performed at [facility], [address], [place], [place] [address]   CBG monitoring, ED     Status: Abnormal   Collection Time: 12/04/18 10:21 PM  Result Value Ref Range   Glucose-Capillary 117 (H) 70 - 99 mg/dL    Blood Alcohol level:  Lab Results  Component Value Date   ETH 166 (H) 12/04/2018   ETH 250 (H) 12/03/2018    Metabolic Disorder Labs:  Lab Results  Component Value Date   HGBA1C 5.2 11/17/2018   MPG 102.54 11/17/2018   MPG 128.37 11/01/2017   No results found for: PROLACTIN Lab Results  Component Value Date   CHOL 109 11/17/2018   TRIG 89 11/17/2018   HDL 60 11/17/2018   CHOLHDL 1.8 11/17/2018   VLDL 18 11/17/2018   LDLCALC 31 11/17/2018   LDLCALC 48 11/01/2017    Current Medications: Current Facility-Administered Medications  Medication Dose Route Frequency Provider Last Rate Last Dose  . acetaminophen (TYLENOL) tablet 650 mg  650 mg Oral Q6H PRN Jackelyn Poling, NP      . alum & mag hydroxide-simeth (MAALOX/MYLANTA) 200-200-20 MG/5ML suspension 30 mL  30 mL Oral Q4H PRN Jackelyn Poling, NP      . aspirin EC tablet 81 mg  81 mg Oral Daily Nira Conn A, NP      . atorvastatin (LIPITOR) tablet 80 mg  80 mg Oral q1800 Nira Conn A, NP      . hydrOXYzine (ATARAX/VISTARIL) tablet 25 mg  25 mg Oral Q6H PRN Nira Conn A, NP      . isosorbide mononitrate (IMDUR) 24 hr tablet 30 mg  30 mg Oral Daily Nira Conn A, NP      .  lisinopril (PRINIVIL,ZESTRIL) tablet 5 mg  5 mg Oral Daily Nira Conn A, NP      . loperamide (IMODIUM) capsule 2-4 mg  2-4 mg Oral PRN Nira Conn A, NP      . LORazepam (ATIVAN) tablet 1 mg  1 mg Oral Q6H PRN Nira Conn A, NP      . LORazepam (ATIVAN) tablet 1 mg  1 mg Oral QID Jackelyn Poling, NP       Followed by  . [START ON 12/06/2018] LORazepam (ATIVAN) tablet 1 mg  1 mg Oral TID Jackelyn Poling, NP       Followed by  . [START ON 12/07/2018] LORazepam (ATIVAN) tablet 1 mg  1 mg Oral BID Jackelyn Poling, NP       Followed by  . [START ON 12/08/2018] LORazepam (ATIVAN) tablet 1 mg  1 mg Oral Daily Nira Conn A, NP      . magnesium hydroxide (MILK OF MAGNESIA) suspension 30 mL  30 mL Oral Daily  PRN Jackelyn PolingBerry, Jason A, NP      . metFORMIN (GLUCOPHAGE) tablet 500 mg  500 mg Oral BID Nira ConnBerry, Jason A, NP      . metoprolol tartrate (LOPRESSOR) tablet 50 mg  50 mg Oral BID Nira ConnBerry, Jason A, NP      . multivitamin with minerals tablet 1 tablet  1 tablet Oral Daily Nira ConnBerry, Jason A, NP      . ondansetron (ZOFRAN-ODT) disintegrating tablet 4 mg  4 mg Oral Q6H PRN Jackelyn PolingBerry, Jason A, NP      . pantoprazole (PROTONIX) EC tablet 40 mg  40 mg Oral Daily Jackelyn PolingBerry, Jason A, NP      . Melene Muller[START ON 12/06/2018] sertraline (ZOLOFT) tablet 100 mg  100 mg Oral Daily Malvin JohnsFarah, Clydell Sposito, MD      . Melene Muller[START ON 12/06/2018] thiamine (VITAMIN B-1) tablet 100 mg  100 mg Oral Daily Nira ConnBerry, Jason A, NP      . ticagrelor (BRILINTA) tablet 90 mg  90 mg Oral BID Nira ConnBerry, Jason A, NP       PTA Medications: Medications Prior to Admission  Medication Sig Dispense Refill Last Dose  . albuterol (PROVENTIL HFA;VENTOLIN HFA) 108 (90 Base) MCG/ACT inhaler Inhale 1-2 puffs into the lungs every 6 (six) hours as needed for wheezing. 1 Inhaler 0 Past Month at Unknown time  . aspirin EC 81 MG tablet Take 1 tablet (81 mg total) by mouth daily. 30 tablet 0 12/03/2018 at Unknown time  . atorvastatin (LIPITOR) 80 MG tablet Take 1 tablet (80 mg total) by mouth  daily at 6 PM. 30 tablet 0 12/03/2018 at Unknown time  . budesonide-formoterol (SYMBICORT) 160-4.5 MCG/ACT inhaler Inhale 2 puffs into the lungs 2 (two) times daily as needed (For shortness of breath.). 1 Inhaler 0 12/03/2018 at Unknown time  . hydrOXYzine (ATARAX/VISTARIL) 50 MG tablet Take 1 tablet (50 mg total) by mouth every 6 (six) hours as needed for anxiety. 30 tablet 0 12/04/2018 at Unknown time  . isosorbide mononitrate (IMDUR) 30 MG 24 hr tablet Take 1 tablet (30 mg total) by mouth daily. 30 tablet 0 12/03/2018 at Unknown time  . lisinopril (PRINIVIL,ZESTRIL) 10 MG tablet Take 0.5 tablets (5 mg total) by mouth daily. 15 tablet 0 12/03/2018 at Unknown time  . metFORMIN (GLUCOPHAGE) 500 MG tablet Take 1 tablet (500 mg total) by mouth 2 (two) times daily. 60 tablet 0 12/03/2018 at Unknown time  . metoprolol tartrate (LOPRESSOR) 50 MG tablet Take 1 tablet (50 mg total) by mouth 2 (two) times daily. 30 tablet 1 12/03/2018 at 1800  . nitroGLYCERIN (NITROSTAT) 0.4 MG SL tablet Place 1 tablet (0.4 mg total) under the tongue every 5 (five) minutes as needed. 15 tablet 0 unknown at prn  . sertraline (ZOLOFT) 50 MG tablet Take 1.5 tablets (75 mg total) by mouth daily. 45 tablet 0 Past Week at Unknown time  . ticagrelor (BRILINTA) 90 MG TABS tablet Take 1 tablet (90 mg total) by mouth 2 (two) times daily. 60 tablet 0 12/03/2018 at 1800    Musculoskeletal: Strength & Muscle Tone: within normal limits Gait & Station: normal Patient leans: N/A  Psychiatric Specialty Exam: Physical Exam  ROS  Blood pressure 124/88, pulse 76, temperature 98.1 F (36.7 C), temperature source Oral, resp. rate 16, height 5\' 6"  (1.676 m), weight 72.6 kg.Body mass index is 25.82 kg/m.  General Appearance: Casual  Eye Contact:  Minimal  Speech:  Slow  Volume:  Decreased  Mood:  Anxious and Depressed  Affect:  Appropriate  Thought Process:  Goal Directed  Orientation:  Full (Time, Place, and Person)  Thought Content:   Logical  Suicidal Thoughts:  Yes.  without intent/plan  Homicidal Thoughts:  No  Memory:  Immediate;   Fair  Judgement:  Intact  Insight:  Fair  Psychomotor Activity:  Decreased  Concentration:  Concentration: Fair  Recall:  [employer] of Knowledge:  Fair  Language:  Fair  Akathisia:  Negative  Handed:  Right  AIMS (if indicated):     Assets:  Desire for Improvement  ADL's:  Intact  Cognition:  WNL and Impaired,  Mild  Sleep:       Treatment Plan Summary: Daily contact with patient to assess and evaluate symptoms and progress in treatment and Medication management  Observation Level/Precautions:  15 minute checks  Laboratory:  UDS  Psychotherapy: Cognitive and rehab based  Medications: Increase sertraline  Consultations: Not needed  Discharge Concerns: Housing  Estimated LOS: 5  Other: Monitor for withdrawal   Physician Treatment Plan for Primary Diagnosis: <principal problem not specified> Long Term Goal(s): Improvement in symptoms so as ready for discharge  Short Term Goals: Ability to identify changes in lifestyle to reduce recurrence of condition will improve, Ability to verbalize feelings will improve and Ability to disclose and discuss suicidal ideas  Physician Treatment Plan for Secondary Diagnosis: Active Problems:   Severe recurrent major depression w/psychotic features, mood-congruent (HCC)  Long Term Goal(s): Improvement in symptoms so as ready for discharge  Short Term Goals: Ability to demonstrate self-control will improve and Ability to identify and develop effective coping behaviors will improve  In summary 57 year old homeless male presenting initially with chest pain and then acknowledging suicidal thoughts acknowledging noncompliance with antidepressants, and alcohol dependence requesting detox.  Continues to endorse that he does not want to live in this world that he is vaguely suicidal but does not have plans or intent and can contract while he is here  continue current precautions and detox protocol  I certify that inpatient services furnished can reasonably be expected to improve the patient's condition.    Malvin Johns, MD 12/10/20198:15 AM

## 2018-12-05 NOTE — Plan of Care (Signed)
  Problem: Safety: Goal: Periods of time without injury will increase Outcome: Progressing   Problem: Education: Goal: Emotional status will improve Outcome: Not Progressing Goal: Mental status will improve Outcome: Not Progressing Goal: Verbalization of understanding the information provided will improve Outcome: Not Progressing

## 2018-12-05 NOTE — ED Provider Notes (Signed)
2:38 AM Berna SpareMarcus, TTS, states patient meets criteria for inpatient psychiatric admission, they are working on placement.   Devoria AlbeKnapp, Eloni Darius, MD 12/05/18 [phone]

## 2018-12-05 NOTE — BH Assessment (Signed)
Tele Assessment Note   Patient Name: Warren Salas MRN: [id number] Referring Physician: Dr. Francine Graven Location of Patient: [facility] Location of Provider: [place] is an 57 y.o. male.  -Clinician reviewed note by Dr. Thurnell Garbe.  Pt was seen at 1150.  Per Police and pt report: Police were called by bystander stating a man was trying to [place]. Pt states he could not get his leg over the railing, but would have jumped if he could. Endorses etoh use this morning, as well as hearing voices telling him to kill himself. Denies HI.  Patient says that he was trying to kill himself an still feels like he wants to do it.  Patient does not feel safe out on his own.  Patient has had one previous suicide attempt.  Patient denies any HI.  He does hear voices telling him to kill himself.  No visual hallucinations.  Patient has a flat affect.  He drinks a 12 pack per day for the last several years.  Patient did drink prior to trying to jump off the bridge.  Patient has no outpatient provider at this time.  He was at [facility] in 09/2018 and 10/2018.    -Patient care discussed with Lindon Romp, FNP.  He recommends inpatient care.  AC Wynonia Hazard checked to see if there were beds on [address] hall.  Clinician let Dr. Tomi Bamberger and nurse Bailey Mech know that patient met inpatient care criteria.  Diagnosis: F33.2 MDD recurrent, severe; F10.20 ETOH use d/o severe  Past Medical History:  Past Medical History:  Diagnosis Date  . Back pain   . COPD (chronic obstructive pulmonary disease) ([place])   . Diabetes mellitus without complication ([place])   . Hypertension   . STEMI (ST elevation myocardial infarction) ([place]) 10/2017    Past Surgical History:  Procedure Laterality Date  . APPENDECTOMY    . CORONARY STENT INTERVENTION N/A 12/29/2017   Procedure: CORONARY STENT INTERVENTION - LAD;  Surgeon: Jettie Booze, MD;  Location: [facility];   Service: Cardiovascular;  Laterality: N/A;  . CORONARY/GRAFT ACUTE MI REVASCULARIZATION N/A 11/01/2017   Procedure: Coronary/Graft Acute MI Revascularization;  Surgeon: Jettie Booze, MD;  Location: [facility];  Service: Cardiovascular;  Laterality: N/A;  . LEFT HEART CATH AND CORONARY ANGIOGRAPHY N/A 11/01/2017   Procedure: LEFT HEART CATH AND CORONARY ANGIOGRAPHY;  Surgeon: Jettie Booze, MD;  Location: [facility];  Service: Cardiovascular;  Laterality: N/A;    Family History:  Family History  Problem Relation Age of Onset  . Hypertension Mother   . Diabetes Mother   . Hypertension Father   . Diabetes Father     Social History:  reports that he has been smoking cigarettes. He has been smoking about 1.00 pack per day. He has never used smokeless tobacco. He reports that he drinks about 12.0 standard drinks of alcohol per week. He reports that he has current or past drug history. Drug: Cocaine.  Additional Social History:  Alcohol / Drug Use Pain Medications: See PTA medication list Prescriptions: See PTA medication list Over the Counter: See PTA medication list History of alcohol / drug use?: Yes Substance #1 Name of Substance 1: ETOH (beer) 1 - Age of First Use: 57 years of age  79 - Amount (size/oz): up to a 12 pack  1 - Frequency: Daily 1 - Duration: on-going 1 - Last Use / Amount: 12/09  Substance #2  Name of Substance 2: Cocaine 2 - Age of First Use: 56 years of age 50 - Amount (size/oz): Varies 2 - Frequency: Every few days 2 - Duration: on-going 2 - Last Use / Amount: Few days ago.  CIWA: CIWA-Ar BP: 115/80 Pulse Rate: 91 Nausea and Vomiting: no nausea and no vomiting Tactile Disturbances: none Tremor: no tremor Auditory Disturbances: not present Paroxysmal Sweats: no sweat visible Visual Disturbances: not present Anxiety: mildly anxious Headache, Fullness in Head: none present Agitation: normal activity Orientation and Clouding of  Sensorium: oriented and can do serial additions CIWA-Ar Total: 1 COWS: Clinical Opiate Withdrawal Scale (COWS) Resting Pulse Rate: Pulse Rate 80 or below Sweating: No report of chills or flushing Restlessness: Able to sit still Pupil Size: Pupils pinned or normal size for room light Bone or Joint Aches: Mild diffuse discomfort Runny Nose or Tearing: Not present GI Upset: No GI symptoms Tremor: Tremor can be felt, but not observed Yawning: Yawning once or twice during assessment Anxiety or Irritability: Patient obviously irritable/anxious Gooseflesh Skin: Skin is smooth COWS Total Score: 5  Allergies: No Known Allergies  Home Medications:  (Not in a hospital admission)  OB/GYN Status:  No LMP for male patient.  General Assessment Data Assessment unable to be completed: Yes Reason for not completing assessment: Pt is sleeping per notes is impaired BAL of 166 Location of Assessment: [facility] TTS Assessment: In system Is this a Tele or Face-to-Face Assessment?: Tele Assessment Is this an Initial Assessment or a Re-assessment for this encounter?: Initial Assessment Patient Accompanied by:: N/A Language Other than English: No Living Arrangements: Homeless/Shelter What gender do you identify as?: Male Marital status: Divorced Pregnancy Status: No Living Arrangements: Other (Comment)(Pt is currently staying in a tent.) Can pt return to current living arrangement?: Yes Admission Status: Voluntary Is patient capable of signing voluntary admission?: Yes Referral Source: Other Insurance type: MCR/MCD     Crisis Care Plan Living Arrangements: Other (Comment)(Pt is currently staying in a tent.) Name of Psychiatrist: None Name of Therapist: None  Education Status Is patient currently in school?: No Highest grade of school patient has completed: 12th Is the patient employed, unemployed or receiving disability?: Receiving disability income  Risk to self with the past 6  months Suicidal Ideation: Yes-Currently Present Has patient been a risk to self within the past 6 months prior to admission? : Yes Suicidal Intent: Yes-Currently Present Has patient had any suicidal intent within the past 6 months prior to admission? : Yes Is patient at risk for suicide?: Yes Suicidal Plan?: Yes-Currently Present Has patient had any suicidal plan within the past 6 months prior to admission? : Yes Specify Current Suicidal Plan: Jump off a bridge Access to Means: Yes Specify Access to Suicidal Means: Bridge, roadways What has been your use of drugs/alcohol within the last 12 months?: ETOH and cociaine Previous Attempts/Gestures: Yes How many times?: 1 Other Self Harm Risks: SA Triggers for Past Attempts: None known Intentional Self Injurious Behavior: None Family Suicide History: Yes(Grandfather illied himself) Recent stressful life event(s): Other (Comment)(being homeless) Persecutory voices/beliefs?: Yes Depression: Yes Depression Symptoms: Despondent, Fatigue, Guilt, Loss of interest in usual pleasures Substance abuse history and/or treatment for substance abuse?: No Suicide prevention information given to non-admitted patients: Not applicable  Risk to Others within the past 6 months Homicidal Ideation: No Does patient have any lifetime risk of violence toward others beyond the six months prior to admission? : No Thoughts of Harm to Others: No Current Homicidal Intent: No  Current Homicidal Plan: No Access to Homicidal Means: No Identified Victim: No one History of harm to others?: No Assessment of Violence: None Noted Violent Behavior Description: None reported Does patient have access to weapons?: No Criminal Charges Pending?: No Does patient have a court date: No Is patient on probation?: No  Psychosis Hallucinations: Auditory(Voices telling him to kill self.) Delusions: None noted  Mental Status Report Appearance/Hygiene: In scrubs Eye Contact:  Poor Motor Activity: Freedom of movement, Unsteady Speech: Logical/coherent Level of Consciousness: Quiet/awake Mood: Depressed, Despair, Helpless, Guilty, Anxious, Sad Affect: Blunted, Depressed Anxiety Level: Panic Attacks Panic attack frequency: Circumstantial Most recent panic attack: "it has been awhile Thought Processes: Coherent, Relevant Judgement: Impaired Orientation: Person, Place, Situation Obsessive Compulsive Thoughts/Behaviors: None  Cognitive Functioning Concentration: Decreased Memory: Remote Intact, Recent Impaired Is patient IDD: No Insight: Poor Impulse Control: Poor Appetite: Fair Have you had any weight changes? : Loss Amount of the weight change? (lbs): (Pt unsure of amount.) Sleep: No Change Total Hours of Sleep: 6 Vegetative Symptoms: None  ADLScreening [facility] Assessment Services) Patient's cognitive ability adequate to safely complete daily activities?: Yes Patient able to express need for assistance with ADLs?: Yes  Prior Inpatient Therapy Prior Inpatient Therapy: Yes Prior Therapy Dates: 11/16/18;10/24/18 Prior Therapy Facilty/Provider(s): [facility] Reason for Treatment: SI/ ETOH  Prior Outpatient Therapy Prior Outpatient Therapy: No Prior Therapy Dates: None Prior Therapy Facilty/Provider(s): Pt cannot recall. Reason for Treatment: ETOH or Detox Does patient have an ACCT team?: No Does patient have Intensive In-House Services?  : No Does patient have [facility] services? : No Does patient have P4CC services?: No  ADL Screening (condition at time of admission) Patient's cognitive ability adequate to safely complete daily activities?: Yes Is the patient deaf or have difficulty hearing?: No Does the patient have difficulty seeing, even when wearing glasses/contacts?: No Does the patient have difficulty concentrating, remembering, or making decisions?: Yes Patient able to express need for assistance with ADLs?: Yes Does the patient have difficulty  dressing or bathing?: No Does the patient have difficulty walking or climbing stairs?: No Weakness of Legs: None Weakness of Arms/Hands: None       Abuse/Neglect Assessment (Assessment to be complete while patient is alone) Abuse/Neglect Assessment Can Be Completed: Yes Physical Abuse: Denies Verbal Abuse: Denies Sexual Abuse: Denies Exploitation of patient/patient's resources: Denies Self-Neglect: Denies     Regulatory affairs officer (For Healthcare) Does Patient Have a Medical Advance Directive?: No Would patient like information on creating a medical advance directive?: No - Patient declined          Disposition:  Disposition Initial Assessment Completed for this Encounter: Yes Patient referred to: Other (Comment)(Pt to be reviewed for inpatient care .)  This service was provided via telemedicine using a 2-way, interactive audio and video technology.  Names of all persons participating in this telemedicine service and their role in this encounter. Name: Warren Salas Role: patient  Name: Curlene Dolphin, M.S. LCAS QP Role: clinician  Name:  Role:   Name:  Role:     Raymondo Band 12/05/2018 2:40 AM

## 2018-12-05 NOTE — ED Notes (Signed)
[facility] via [facility]. Pt belongings given to transportation. Pt ambulatory and stable.

## 2018-12-05 NOTE — [facility] Group Notes (Signed)
[facility] Mental Health Association Group Therapy 12/05/2018 1:15pm  Type of Therapy: Mental Health Association Presentation  Participation Level: Active  Participation Quality: Attentive  Affect: Appropriate  Cognitive: Oriented  Insight: Developing/Improving  Engagement in Therapy: Engaged  Modes of Intervention: Discussion, Education and Socialization  Summary of Progress/Problems: Mental Health Association (MHA) Speaker came to talk about his personal journey with mental health. The pt processed ways by which to relate to the speaker. MHA speaker provided handouts and educational information pertaining to groups and services offered by the MHA. Pt was engaged in speaker's presentation and was receptive to resources provided.    Hawken Bielby S Sydna Brodowski, LCSW 12/05/2018 2:19 PM  

## 2018-12-05 NOTE — [facility] Suicide Risk Assessment (Signed)
[facility] INPATIENT:  Family/Significant Other Suicide Prevention Education  Suicide Prevention Education:  Patient Refusal for Family/Significant Other Suicide Prevention Education: The patient Warren Salas has refused to provide written consent for family/significant other to be provided Family/Significant Other Suicide Prevention Education during admission and/or prior to discharge.  Physician notified.  SPE completed with pt, as pt refused to consent to family contact. SPI pamphlet provided to pt and pt was encouraged to share information with support network, ask questions, and talk about any concerns relating to SPE. Pt denies access to guns/firearms and verbalized understanding of information provided. Mobile Crisis information also provided to pt.   Rona RavensHeather S Ammy Lienhard LCSW 12/05/2018, 10:13 AM

## 2018-12-05 NOTE — Progress Notes (Signed)
Patient ID: Warren Salas, male   DOB: February 19, 1961, 57 y.o.   MRN: [id number] Per State regulations 482.30 this chart was reviewed for medical necessity with respect to the patient's admission/duration of stay.    Next review date: 12/09/18  Thurman CoyerEric Croy Drumwright, BSN, RN-BC  Case Manager
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
## 2018-12-05 NOTE — Tx Team (Signed)
Initial Treatment Plan 12/05/2018 5:39 AM Warren Salas ZOX:[id number]    PATIENT STRESSORS: Financial difficulties Health problems Medication change or noncompliance Substance abuse   PATIENT STRENGTHS: General fund of knowledge Motivation for treatment/growth   PATIENT IDENTIFIED PROBLEMS: Risk for suicide  SA- ETOH  Psychosis- AVH  "stay away from alcohol"               DISCHARGE CRITERIA:  Adequate post-discharge living arrangements Improved stabilization in mood, thinking, and/or behavior Verbal commitment to aftercare and medication compliance  PRELIMINARY DISCHARGE PLAN: Attend aftercare/continuing care group Outpatient therapy  PATIENT/FAMILY INVOLVEMENT: This treatment plan has been presented to and reviewed with the patient, Warren Salas.  The patient and family have been given the opportunity to ask questions and make suggestions.  Delos HaringPhillips, Evon Dejarnett A, RN 12/05/2018, 5:39 AM

## 2018-12-06 NOTE — [facility] Group Notes (Signed)
LCSW Group Therapy Note  12/06/2018 1:15pm  Type of Therapy/Topic:  Group Therapy:  Feelings about Diagnosis  Participation Level:  Did Not Attend--pt invited. Pt chose to remain in bed.    Description of Group:   This group will allow patients to explore their thoughts and feelings about diagnoses they have received. Patients will be guided to explore their level of understanding and acceptance of these diagnoses. Facilitator will encourage patients to process their thoughts and feelings about the reactions of others to their diagnosis and will guide patients in identifying ways to discuss their diagnosis with significant others in their lives. This group will be process-oriented, with patients participating in exploration of their own experiences, giving and receiving support, and processing challenge from other group members.   Therapeutic Goals: 1. Patient will demonstrate understanding of diagnosis as evidenced by identifying two or more symptoms of the disorder 2. Patient will be able to express two feelings regarding the diagnosis 3. Patient will demonstrate their ability to communicate their needs through discussion and/or role play  Summary of Patient Progress:  x  Therapeutic Modalities:   Cognitive Behavioral Therapy Brief Therapy Feelings Identification    Warren Salas Basic, LCSW 12/06/2018 11:33 AM

## 2018-12-06 NOTE — Progress Notes (Signed)
Pt has been in bed since the beginning of the shift   He has not complained of anything but being thirsty and has slept most of the time   Pt received pitcher of water    He did not come for medication  Will continue to [employer]

## 2018-12-06 NOTE — Progress Notes (Signed)
Recreation Therapy Notes  Date: 12.11.19 Time: 0930 Location: [address] Dayroom  Group Topic: Stress Management  Goal Area(s) Addresses:  Patient will verbalize importance of using healthy stress management.  Patient will identify positive emotions associated with healthy stress management.   Intervention: Stress Management  Activity : Meditation.  LRT introduced the stress management technique of meditation.  LRT played a meditation on being resilient.  Patients were to follow along as meditation played to engage in activity.  Education:  Stress Management, Discharge Planning.   Education Outcome: Acknowledges edcuation/In group clarification offered/Needs additional education  Clinical Observations/Feedback: Pt did not attend group.     Caroll RancherMarjette Nishtha Raider, LRT/CTRS         Lillia AbedLindsay, Kailynne Ferrington A 12/06/2018 10:49 AM

## 2018-12-06 NOTE — Progress Notes (Addendum)
Patient ID: Warren Salas, male   DOB: 06-21-1961, 57 y.o.   MRN: [id number]  D. Pt presents with a blunted affect and anxious behavior. Pt reports ongoing passive SI, no specific plan. Pt currently denies HI and AVH and agrees to contact staff before acting on any harmful thoughts. Presents with signs and symptoms of withdrawal including anxiety, fatigue, and tremors.    A. Labs and vitals monitored. Pt given and educated on medications. Pt supported emotionally and encouraged to express concerns and ask questions.    R. Pt remains safe with 15 minute checks. Pt attends groups today, interaction with peers is minimal but pleasant. Will continue POC.

## 2018-12-06 NOTE — Progress Notes (Signed)
Patient did not attend the evening speaker NA meeting. Pt was notified that group was beginning but remained in bed.   

## 2018-12-06 NOTE — Progress Notes (Signed)
[facility] MD Progress Note  12/06/2018 10:06 AM Warren Salas  MRN:  [id number] Subjective:    Patient seen he reports minimal to no withdrawal symptoms no tremors no cravings however issues of secondary gain are raised in the treatment team. he elaborated that he would go out and get drunk and come back for housing purposes We discussed that he cannot misuse the hospital for housing purposes however if he certainly needs detox we can accommodate that and if he is truly suicidal we offer him treatment.  Made it very clear without refusing treatment to him but that he cannot specked admission for open malingering once he acknowledges this  No tremors no auditory visual loose Nations no seizure activity  Vital stable  Mood is generally euthymic Principal Problem: Call dependence/threats of self-harm/depressive symptoms/this is a secondary gain due to chronic homelessness Diagnosis: Active Problems:   Severe recurrent major depression w/psychotic features, mood-congruent (HCC)  Total Time spent with patient: 20 minutes  Past Medical History:  Past Medical History:  Diagnosis Date  . Back pain   . COPD (chronic obstructive pulmonary disease) (HCC)   . Diabetes mellitus without complication (HCC)   . Hypertension   . STEMI (ST elevation myocardial infarction) (HCC) 10/2017    Past Surgical History:  Procedure Laterality Date  . APPENDECTOMY    . CORONARY STENT INTERVENTION N/A 12/29/2017   Procedure: CORONARY STENT INTERVENTION - LAD;  Surgeon: Corky Crafts, MD;  Location: [facility] INVASIVE CV LAB;  Service: Cardiovascular;  Laterality: N/A;  . CORONARY/GRAFT ACUTE MI REVASCULARIZATION N/A 11/01/2017   Procedure: Coronary/Graft Acute MI Revascularization;  Surgeon: Corky Crafts, MD;  Location: [facility] INVASIVE CV LAB;  Service: Cardiovascular;  Laterality: N/A;  . LEFT HEART CATH AND CORONARY ANGIOGRAPHY N/A 11/01/2017   Procedure: LEFT HEART CATH AND CORONARY ANGIOGRAPHY;  Surgeon:  Corky Crafts, MD;  Location: [facility] INVASIVE CV LAB;  Service: Cardiovascular;  Laterality: N/A;   Family History:  Family History  Problem Relation Age of Onset  . Hypertension Mother   . Diabetes Mother   . Hypertension Father   . Diabetes Father     Social History:  Social History   Substance and Sexual Activity  Alcohol Use Yes  . Alcohol/week: 12.0 standard drinks  . Types: 12 Cans of beer per week     Social History   Substance and Sexual Activity  Drug Use Yes  . Types: Cocaine   Comment: last use 3-4 days ago.    Social History   Socioeconomic History  . Marital status: Divorced    Spouse name: Not on file  . Number of children: 3  . Years of education: Not on file  . Highest education level: Not on file  Occupational History  . Not on file  Social Needs  . Financial resource strain: Not on file  . Food insecurity:    Worry: Not on file    Inability: Not on file  . Transportation needs:    Medical: Not on file    Non-medical: Not on file  Tobacco Use  . Smoking status: Current Every Day Smoker    Packs/day: 1.00    Types: Cigarettes  . Smokeless tobacco: Never Used  Substance and Sexual Activity  . Alcohol use: Yes    Alcohol/week: 12.0 standard drinks    Types: 12 Cans of beer per week  . Drug use: Yes    Types: Cocaine    Comment: last use 3-4 days ago.  Marland Kitchen  Sexual activity: Never  Lifestyle  . Physical activity:    Days per week: Not on file    Minutes per session: Not on file  . Stress: Not on file  Relationships  . Social connections:    Talks on phone: Not on file    Gets together: Not on file    Attends religious service: Not on file    Active member of club or organization: Not on file    Attends meetings of clubs or organizations: Not on file    Relationship status: Not on file  Other Topics Concern  . Not on file  Social History Narrative  . Not on file   Additional Social History:                          Sleep: Good  Appetite:  Good  Current Medications: Current Facility-Administered Medications  Medication Dose Route Frequency Provider Last Rate Last Dose  . acetaminophen (TYLENOL) tablet 650 mg  650 mg Oral Q6H PRN Nira Conn A, NP      . alum & mag hydroxide-simeth (MAALOX/MYLANTA) 200-200-20 MG/5ML suspension 30 mL  30 mL Oral Q4H PRN Nira Conn A, NP      . aspirin EC tablet 81 mg  81 mg Oral Daily Nira Conn A, NP   81 mg at 12/06/18 0853  . atorvastatin (LIPITOR) tablet 80 mg  80 mg Oral q1800 Nira Conn A, NP   80 mg at 12/05/18 1718  . hydrOXYzine (ATARAX/VISTARIL) tablet 25 mg  25 mg Oral Q6H PRN Nira Conn A, NP      . isosorbide mononitrate (IMDUR) 24 hr tablet 30 mg  30 mg Oral Daily Nira Conn A, NP   30 mg at 12/06/18 0853  . lisinopril (PRINIVIL,ZESTRIL) tablet 5 mg  5 mg Oral Daily Nira Conn A, NP   5 mg at 12/06/18 0853  . loperamide (IMODIUM) capsule 2-4 mg  2-4 mg Oral PRN Nira Conn A, NP      . LORazepam (ATIVAN) tablet 1 mg  1 mg Oral Q6H PRN Nira Conn A, NP      . LORazepam (ATIVAN) tablet 1 mg  1 mg Oral TID Nira Conn A, NP   1 mg at 12/06/18 0853   Followed by  . [START ON 12/07/2018] LORazepam (ATIVAN) tablet 1 mg  1 mg Oral BID Jackelyn Poling, NP       Followed by  . [START ON 12/08/2018] LORazepam (ATIVAN) tablet 1 mg  1 mg Oral Daily Nira Conn A, NP      . magnesium hydroxide (MILK OF MAGNESIA) suspension 30 mL  30 mL Oral Daily PRN Nira Conn A, NP      . metFORMIN (GLUCOPHAGE) tablet 500 mg  500 mg Oral BID Nira Conn A, NP   500 mg at 12/06/18 0853  . metoprolol tartrate (LOPRESSOR) tablet 50 mg  50 mg Oral BID Nira Conn A, NP   50 mg at 12/06/18 0853  . multivitamin with minerals tablet 1 tablet  1 tablet Oral Daily Nira Conn A, NP   1 tablet at 12/06/18 0853  . nicotine (NICODERM CQ - dosed in mg/24 hours) patch 21 mg  21 mg Transdermal Daily Malvin Johns, MD   21 mg at 12/06/18 0853  . ondansetron (ZOFRAN-ODT)  disintegrating tablet 4 mg  4 mg Oral Q6H PRN Nira Conn A, NP      . pantoprazole (PROTONIX) EC tablet  40 mg  40 mg Oral Daily Nira ConnBerry, Jason A, NP   40 mg at 12/06/18 0853  . sertraline (ZOLOFT) tablet 100 mg  100 mg Oral Daily Malvin JohnsFarah, Vegas Coffin, MD   100 mg at 12/06/18 0853  . thiamine (VITAMIN B-1) tablet 100 mg  100 mg Oral Daily Nira ConnBerry, Jason A, NP   100 mg at 12/06/18 0854  . ticagrelor (BRILINTA) tablet 90 mg  90 mg Oral BID Nira ConnBerry, Jason A, NP   90 mg at 12/06/18 16100853    Lab Results:  Results for orders placed or performed during the hospital encounter of 12/04/18 (from the past 48 hour(s))  Comprehensive metabolic panel     Status: Abnormal   Collection Time: 12/04/18 11:46 AM  Result Value Ref Range   Sodium 135 135 - 145 mmol/L   Potassium 3.6 3.5 - 5.1 mmol/L   Chloride 99 98 - 111 mmol/L   CO2 20 (L) 22 - 32 mmol/L   Glucose, Bld 105 (H) 70 - 99 mg/dL   BUN 7 6 - 20 mg/dL   Creatinine, Ser 9.600.51 (L) 0.61 - 1.24 mg/dL   Calcium 9.1 8.9 - 45.410.3 mg/dL   Total Protein 8.0 6.5 - 8.1 g/dL   Albumin 4.7 3.5 - 5.0 g/dL   AST 22 15 - 41 U/L   ALT 21 0 - 44 U/L   Alkaline Phosphatase 82 38 - 126 U/L   Total Bilirubin 0.4 0.3 - 1.2 mg/dL   GFR calc non Af Amer >60 >60 mL/min   GFR calc Af Amer >60 >60 mL/min   Anion gap 16 (H) 5 - 15    Comment: Performed at [facility], [address] [place], [place] [address]  Ethanol     Status: Abnormal   Collection Time: 12/04/18 11:46 AM  Result Value Ref Range   Alcohol, Ethyl (B) 166 (H) <10 mg/dL    Comment: (NOTE) Lowest detectable limit for serum alcohol is 10 mg/dL. For medical purposes only. Performed at [facility], [address] [place], [place] [address]   Salicylate level     Status: None   Collection Time: 12/04/18 11:46 AM  Result Value Ref Range   Salicylate Lvl <7.0 2.8 - 30.0 mg/dL    Comment: Performed at [facility], [address] [place], [place] [address]   Acetaminophen level     Status: Abnormal   Collection Time: 12/04/18 11:46 AM  Result Value Ref Range   Acetaminophen (Tylenol), Serum <10 (L) 10 - 30 ug/mL    Comment: (NOTE) Therapeutic concentrations vary significantly. A range of 10-30 ug/mL  may be an effective concentration for many patients. However, some  are best treated at concentrations outside of this range. Acetaminophen concentrations >150 ug/mL at 4 hours after ingestion  and >50 ug/mL at 12 hours after ingestion are often associated with  toxic reactions. Performed at [facility], [address] [place], [place] [address]   cbc     Status: Abnormal   Collection Time: 12/04/18 11:46 AM  Result Value Ref Range   WBC 11.0 (H) 4.0 - 10.5 K/uL   RBC 4.74 4.22 - 5.81 MIL/uL   Hemoglobin 15.9 13.0 - 17.0 g/dL   HCT 57.848.1 46.939.0 - 62.952.0 %   MCV 101.5 (H) 80.0 - 100.0 fL   MCH 33.5 26.0 - 34.0 pg   MCHC 33.1 30.0 - 36.0 g/dL   RDW 52.812.1 41.311.5 - 24.415.5 %  Platelets 223 150 - 400 K/uL   nRBC 0.0 0.0 - 0.2 %    Comment: Performed at [facility], [address] [place], [place] [address]  Rapid urine drug screen (hospital performed)     Status: Abnormal   Collection Time: 12/04/18  3:31 PM  Result Value Ref Range   Opiates NONE DETECTED NONE DETECTED   Cocaine POSITIVE (A) NONE DETECTED   Benzodiazepines POSITIVE (A) NONE DETECTED   Amphetamines NONE DETECTED NONE DETECTED   Tetrahydrocannabinol NONE DETECTED NONE DETECTED   Barbiturates NONE DETECTED NONE DETECTED    Comment: (NOTE) DRUG SCREEN FOR MEDICAL PURPOSES ONLY.  IF CONFIRMATION IS NEEDED FOR ANY PURPOSE, NOTIFY LAB WITHIN 5 DAYS. LOWEST DETECTABLE LIMITS FOR URINE DRUG SCREEN Drug Class                     Cutoff (ng/mL) Amphetamine and metabolites    1000 Barbiturate and metabolites    200 Benzodiazepine                 200 Tricyclics and metabolites     300 Opiates and metabolites        300 Cocaine and metabolites         300 THC                            50 Performed at [facility], [address], [place], [place] [address]   CBG monitoring, ED     Status: Abnormal   Collection Time: 12/04/18 10:21 PM  Result Value Ref Range   Glucose-Capillary 117 (H) 70 - 99 mg/dL    Blood Alcohol level:  Lab Results  Component Value Date   ETH 166 (H) 12/04/2018   ETH 250 (H) 12/03/2018    Metabolic Disorder Labs: Lab Results  Component Value Date   HGBA1C 5.2 11/17/2018   MPG 102.54 11/17/2018   MPG 128.37 11/01/2017   No results found for: PROLACTIN Lab Results  Component Value Date   CHOL 109 11/17/2018   TRIG 89 11/17/2018   HDL 60 11/17/2018   CHOLHDL 1.8 11/17/2018   VLDL 18 11/17/2018   LDLCALC 31 11/17/2018   LDLCALC 48 11/01/2017    Physical Findings: AIMS: Facial and Oral Movements Muscles of Facial Expression: None, normal Lips and Perioral Area: None, normal Jaw: None, normal Tongue: None, normal,Extremity Movements Upper (arms, wrists, hands, fingers): None, normal Lower (legs, knees, ankles, toes): None, normal, Trunk Movements Neck, shoulders, hips: None, normal, Overall Severity Severity of abnormal movements (highest score from questions above): None, normal Incapacitation due to abnormal movements: None, normal Patient's awareness of abnormal movements (rate only patient's report): No Awareness, Dental Status Current problems with teeth and/or dentures?: No Does patient usually wear dentures?: No  CIWA:  CIWA-Ar Total: 2 COWS:  COWS Total Score: 0  Musculoskeletal: Strength & Muscle Tone: within normal limits Gait & Station: normal Patient leans: N/A  Psychiatric Specialty Exam: Physical Exam  ROS  Blood pressure 130/88, pulse 79, temperature (!) 97.5 F (36.4 C), temperature source Oral, resp. rate 16, height 5\' 6"  (1.676 m), weight 72.6 kg.Body mass index is 25.82 kg/m.  General Appearance: Casual  Eye Contact:  Minimal  Speech:   Normal Rate  Volume:  Normal  Mood:  Euthymic  Affect:  Flat  Thought Process:  Goal Directed  Orientation:  Full (Time, Place, and Person)  Thought Content:  Tangential  Suicidal Thoughts:  No  Homicidal Thoughts:  No  Memory:  Immediate;   Poor  Judgement:  Fair  Insight:  Fair  Psychomotor Activity:  Psychomotor Retardation  Concentration:  Concentration: Fair  Recall:  Fair  Fund of Knowledge:  Fair  Language:  Fair  Akathisia:  Negative  Handed:  Right  AIMS (if indicated):     Assets:  Desire for Improvement  ADL's:  Intact  Cognition:  WNL  Sleep:  Number of Hours: 6.5    No real changes in treatment plan needed we will continue detox 1 more day probable discharge tomorrow New cognitive and rehab based therapies  Treatment Plan Summary: Daily contact with patient to assess and evaluate symptoms and progress in treatment and Medication management  Inocencia Murtaugh, MD 12/06/2018, 10:06 AM

## 2018-12-06 NOTE — Therapy (Signed)
Occupational Therapy Group Note  Date:  12/06/2018 Time:  11:50 AM  Group Topic/Focus:  Stress Management  Participation Level:  Active  Participation Quality:  Appropriate  Affect:  Depressed and Flat  Cognitive:  Appropriate  Insight: Improving  Engagement in Group:  Engaged  Modes of Intervention:  Activity, Discussion, Education and Socialization  Additional Comments:    S: "You need to handle your stress yourself, not rely on other people- they make it worse"  O: Stress management group completed to use as productive coping strategy, to help mitigate maladaptive coping to integrate in functional BADL/IADL. Education given on the definition of stress and its cognitive, behavioral, emotional, and physical effects on the body. Stress symptom checklist completed. Stress management tool worksheet discussed to educate on unhealthy vs healthy coping skills to manage stress to improve community integration.   A: Pt presents to group with flat/depressed affect, engaged and participatory despite feeling drowsy. Pt initiating conversation, offering examples, and relating to other group members. Pt completed stress symptom checklist, showing?high?signs of stress. Pt engaged in stress management tools worksheet, offering examples and how to improve current practices. He shares that when he uses alcohol, he loses his ability to think rationally and often makes impulsive decisions that he later regrets.   P: Pt provided with education on stress management activities to implement into daily routine. Handouts given to facilitate carryover when reintegrating into community   Dalphine HandingKaylee Leone Mobley, MSOT, OTR/L [facility] OT/ Acute Relief OT PHP Office: [phone]  Dalphine HandingKaylee Onesha Krebbs 12/06/2018, 11:50 AM

## 2018-12-06 NOTE — Tx Team (Signed)
Interdisciplinary Treatment and Diagnostic Plan Update  12/06/2018 Time of Session: 0830AM Warren Salas MRN: [id number]  Principal Diagnosis: MDD, recurrent, severe, with psychotic features  Secondary Diagnoses: Active Problems:   Severe recurrent major depression w/psychotic features, mood-congruent (HCC)   Current Medications:  Current Facility-Administered Medications  Medication Dose Route Frequency Provider Last Rate Last Dose  . acetaminophen (TYLENOL) tablet 650 mg  650 mg Oral Q6H PRN Nira Conn A, NP      . alum & mag hydroxide-simeth (MAALOX/MYLANTA) 200-200-20 MG/5ML suspension 30 mL  30 mL Oral Q4H PRN Nira Conn A, NP      . aspirin EC tablet 81 mg  81 mg Oral Daily Nira Conn A, NP   81 mg at 12/06/18 0853  . atorvastatin (LIPITOR) tablet 80 mg  80 mg Oral q1800 Nira Conn A, NP   80 mg at 12/05/18 1718  . hydrOXYzine (ATARAX/VISTARIL) tablet 25 mg  25 mg Oral Q6H PRN Nira Conn A, NP      . isosorbide mononitrate (IMDUR) 24 hr tablet 30 mg  30 mg Oral Daily Nira Conn A, NP   30 mg at 12/06/18 0853  . lisinopril (PRINIVIL,ZESTRIL) tablet 5 mg  5 mg Oral Daily Nira Conn A, NP   5 mg at 12/06/18 0853  . loperamide (IMODIUM) capsule 2-4 mg  2-4 mg Oral PRN Nira Conn A, NP      . LORazepam (ATIVAN) tablet 1 mg  1 mg Oral Q6H PRN Nira Conn A, NP      . LORazepam (ATIVAN) tablet 1 mg  1 mg Oral TID Nira Conn A, NP   1 mg at 12/06/18 0853   Followed by  . [START ON 12/07/2018] LORazepam (ATIVAN) tablet 1 mg  1 mg Oral BID Jackelyn Poling, NP       Followed by  . [START ON 12/08/2018] LORazepam (ATIVAN) tablet 1 mg  1 mg Oral Daily Nira Conn A, NP      . magnesium hydroxide (MILK OF MAGNESIA) suspension 30 mL  30 mL Oral Daily PRN Nira Conn A, NP      . metFORMIN (GLUCOPHAGE) tablet 500 mg  500 mg Oral BID Nira Conn A, NP   500 mg at 12/06/18 0853  . metoprolol tartrate (LOPRESSOR) tablet 50 mg  50 mg Oral BID Nira Conn A, NP   50 mg at  12/06/18 0853  . multivitamin with minerals tablet 1 tablet  1 tablet Oral Daily Nira Conn A, NP   1 tablet at 12/06/18 0853  . nicotine (NICODERM CQ - dosed in mg/24 hours) patch 21 mg  21 mg Transdermal Daily Malvin Johns, MD   21 mg at 12/06/18 0853  . ondansetron (ZOFRAN-ODT) disintegrating tablet 4 mg  4 mg Oral Q6H PRN Nira Conn A, NP      . pantoprazole (PROTONIX) EC tablet 40 mg  40 mg Oral Daily Nira Conn A, NP   40 mg at 12/06/18 0853  . sertraline (ZOLOFT) tablet 100 mg  100 mg Oral Daily Malvin Johns, MD   100 mg at 12/06/18 0853  . thiamine (VITAMIN B-1) tablet 100 mg  100 mg Oral Daily Nira Conn A, NP   100 mg at 12/06/18 0854  . ticagrelor (BRILINTA) tablet 90 mg  90 mg Oral BID Nira Conn A, NP   90 mg at 12/06/18 3474   PTA Medications: Medications Prior to Admission  Medication Sig Dispense Refill Last Dose  . albuterol (PROVENTIL HFA;VENTOLIN HFA) 108 (  90 Base) MCG/ACT inhaler Inhale 1-2 puffs into the lungs every 6 (six) hours as needed for wheezing. 1 Inhaler 0 Past Month at Unknown time  . aspirin EC 81 MG tablet Take 1 tablet (81 mg total) by mouth daily. 30 tablet 0 12/03/2018 at Unknown time  . atorvastatin (LIPITOR) 80 MG tablet Take 1 tablet (80 mg total) by mouth daily at 6 PM. 30 tablet 0 12/03/2018 at Unknown time  . budesonide-formoterol (SYMBICORT) 160-4.5 MCG/ACT inhaler Inhale 2 puffs into the lungs 2 (two) times daily as needed (For shortness of breath.). 1 Inhaler 0 12/03/2018 at Unknown time  . hydrOXYzine (ATARAX/VISTARIL) 50 MG tablet Take 1 tablet (50 mg total) by mouth every 6 (six) hours as needed for anxiety. 30 tablet 0 12/04/2018 at Unknown time  . isosorbide mononitrate (IMDUR) 30 MG 24 hr tablet Take 1 tablet (30 mg total) by mouth daily. 30 tablet 0 12/03/2018 at Unknown time  . lisinopril (PRINIVIL,ZESTRIL) 10 MG tablet Take 0.5 tablets (5 mg total) by mouth daily. 15 tablet 0 12/03/2018 at Unknown time  . metFORMIN (GLUCOPHAGE) 500 MG  tablet Take 1 tablet (500 mg total) by mouth 2 (two) times daily. 60 tablet 0 12/03/2018 at Unknown time  . metoprolol tartrate (LOPRESSOR) 50 MG tablet Take 1 tablet (50 mg total) by mouth 2 (two) times daily. 30 tablet 1 12/03/2018 at 1800  . nitroGLYCERIN (NITROSTAT) 0.4 MG SL tablet Place 1 tablet (0.4 mg total) under the tongue every 5 (five) minutes as needed. 15 tablet 0 unknown at prn  . sertraline (ZOLOFT) 50 MG tablet Take 1.5 tablets (75 mg total) by mouth daily. 45 tablet 0 Past Week at Unknown time  . ticagrelor (BRILINTA) 90 MG TABS tablet Take 1 tablet (90 mg total) by mouth 2 (two) times daily. 60 tablet 0 12/03/2018 at 1800    Patient Stressors: Financial difficulties Health problems Medication change or noncompliance Substance abuse  Patient Strengths: Geographical information systems officer for treatment/growth  Treatment Modalities: Medication Management, Group therapy, Case management,  1 to 1 session with clinician, Psychoeducation, Recreational therapy.   Physician Treatment Plan for Primary Diagnosis: MDD, recurrent, severe, with psychotic features  Long Term Goal(s): Improvement in symptoms so as ready for discharge Improvement in symptoms so as ready for discharge   Short Term Goals: Ability to identify changes in lifestyle to reduce recurrence of condition will improve Ability to verbalize feelings will improve Ability to disclose and discuss suicidal ideas Ability to demonstrate self-control will improve Ability to identify and develop effective coping behaviors will improve  Medication Management: Evaluate patient's response, side effects, and tolerance of medication regimen.  Therapeutic Interventions: 1 to 1 sessions, Unit Group sessions and Medication administration.  Evaluation of Outcomes: Progressing  Physician Treatment Plan for Secondary Diagnosis: Active Problems:   Severe recurrent major depression w/psychotic features, mood-congruent  (HCC)  Long Term Goal(s): Improvement in symptoms so as ready for discharge Improvement in symptoms so as ready for discharge   Short Term Goals: Ability to identify changes in lifestyle to reduce recurrence of condition will improve Ability to verbalize feelings will improve Ability to disclose and discuss suicidal ideas Ability to demonstrate self-control will improve Ability to identify and develop effective coping behaviors will improve     Medication Management: Evaluate patient's response, side effects, and tolerance of medication regimen.  Therapeutic Interventions: 1 to 1 sessions, Unit Group sessions and Medication administration.  Evaluation of Outcomes: Progressing   RN Treatment Plan for Primary  Diagnosis: MDD, recurrent, severe, with psychotic features  Long Term Goal(s): Knowledge of disease and therapeutic regimen to maintain health will improve  Short Term Goals: Ability to remain free from injury will improve, Ability to verbalize feelings will improve, Ability to disclose and discuss suicidal ideas and Ability to identify and develop effective coping behaviors will improve  Medication Management: RN will administer medications as ordered by provider, will assess and evaluate patient's response and provide education to patient for prescribed medication. RN will report any adverse and/or side effects to prescribing provider.  Therapeutic Interventions: 1 on 1 counseling sessions, Psychoeducation, Medication administration, Evaluate responses to treatment, Monitor vital signs and CBGs as ordered, Perform/monitor CIWA, COWS, AIMS and Fall Risk screenings as ordered, Perform wound care treatments as ordered.  Evaluation of Outcomes: Progressing   LCSW Treatment Plan for Primary Diagnosis: MDD, recurrent, severe, with psychotic features  Long Term Goal(s): Safe transition to appropriate next level of care at discharge, Engage patient in therapeutic group addressing  interpersonal concerns.  Short Term Goals: Engage patient in aftercare planning with referrals and resources, Facilitate patient progression through stages of change regarding substance use diagnoses and concerns and Identify triggers associated with mental health/substance abuse issues  Therapeutic Interventions: Assess for all discharge needs, 1 to 1 time with Social worker, Explore available resources and support systems, Assess for adequacy in community support network, Educate family and significant other(s) on suicide prevention, Complete Psychosocial Assessment, Interpersonal group therapy.  Evaluation of Outcomes: Progressing   Progress in Treatment: Attending groups: Yes. Participating in groups: Yes. Taking medication as prescribed: Yes. Toleration medication: Yes. Family/Significant other contact made: SPE completed with pt; pt declined to consent to collateral contact.  Patient understands diagnosis: Yes. Discussing patient identified problems/goals with staff: Yes. Medical problems stabilized or resolved: Yes. Denies suicidal/homicidal ideation: Yes. Issues/concerns per patient self-inventory: No. Other: n/a  New problem(s) identified: No, Describe:  n/a  New Short Term/Long Term Goal(s): elimination of AVH, detox, medication management for mood stabilization; elimination of SI thoughts; development of comprehensive mental wellness/sobriety plan.   Patient Goals:  "to stay away from alcohol and stay sober."   Discharge Plan or Barriers: [facility]. Referrals made. [facility] for outpatient medication management. [facility] pamphlet, Mobile Crisis information, and AA/NA information provided to patient for additional community support and resources. (PT WAS REFERRED TO [facility] LAST ADMISSION AND DID NOT CALL TO LEET THEM KNOW HE CHANGED HIS MIND. PT MAY NOT BE ELIGIBLE FOR ANOTHER SCREENING FOR 90 DAYS).   Reason for Continuation of Hospitalization:  Anxiety Depression Medication stabilization Withdrawal symptoms  Estimated Length of Stay: Thursday, 12/07/18  Attendees: Patient: Warren Salas 12/06/2018 9:59 AM  Physician: Dr. Jeannine KittenFarah MD 12/06/2018 9:59 AM  Nursing: Huntley DecSara RN; [place] ManorBeverly RN 12/06/2018 9:59 AM  RN Care Manager:x 12/06/2018 9:59 AM  Social Worker: Corrie MckusickHeather Hobson Lax LCSW 12/06/2018 9:59 AM  Recreational Therapist: x 12/06/2018 9:59 AM  Other: Armandina StammerAgnes Nwoko NP 12/06/2018 9:59 AM  Other:  12/06/2018 9:59 AM  Other: 12/06/2018 9:59 AM    Scribe for Treatment Team: Rona RavensHeather S Benigno Check, LCSW 12/06/2018 9:59 AM

## 2018-12-07 ENCOUNTER — Encounter (HOSPITAL_COMMUNITY): Payer: Self-pay | Admitting: Emergency Medicine

## 2018-12-07 ENCOUNTER — Other Ambulatory Visit: Payer: Self-pay

## 2018-12-07 ENCOUNTER — Emergency Department (HOSPITAL_COMMUNITY)
Admission: EM | Admit: 2018-12-07 | Discharge: 2018-12-07 | Disposition: A | Discharge status: Home / Self Care | Payer: Medicare Other | Attending: Emergency Medicine | Admitting: Emergency Medicine

## 2018-12-07 DIAGNOSIS — J449 Chronic obstructive pulmonary disease, unspecified: Secondary | ICD-10-CM | POA: Insufficient documentation

## 2018-12-07 DIAGNOSIS — I10 Essential (primary) hypertension: Secondary | ICD-10-CM | POA: Insufficient documentation

## 2018-12-07 DIAGNOSIS — F1490 Cocaine use, unspecified, uncomplicated: Secondary | ICD-10-CM | POA: Insufficient documentation

## 2018-12-07 DIAGNOSIS — I252 Old myocardial infarction: Secondary | ICD-10-CM | POA: Insufficient documentation

## 2018-12-07 DIAGNOSIS — E119 Type 2 diabetes mellitus without complications: Secondary | ICD-10-CM | POA: Insufficient documentation

## 2018-12-07 DIAGNOSIS — F17210 Nicotine dependence, cigarettes, uncomplicated: Secondary | ICD-10-CM | POA: Insufficient documentation

## 2018-12-07 DIAGNOSIS — Z7984 Long term (current) use of oral hypoglycemic drugs: Secondary | ICD-10-CM | POA: Insufficient documentation

## 2018-12-07 DIAGNOSIS — F10920 Alcohol use, unspecified with intoxication, uncomplicated: Secondary | ICD-10-CM | POA: Insufficient documentation

## 2018-12-07 DIAGNOSIS — Z79899 Other long term (current) drug therapy: Secondary | ICD-10-CM | POA: Insufficient documentation

## 2018-12-07 LAB — GLUCOSE, CAPILLARY: Glucose-Capillary: 167 mg/dL — ABNORMAL HIGH (ref 70–99)

## 2018-12-07 MED ORDER — ISOSORBIDE MONONITRATE ER 30 MG PO TB24: 30.0000 mg | ORAL_TABLET | Freq: Every day | ORAL | 0 refills | Status: DC

## 2018-12-07 MED ORDER — PANTOPRAZOLE SODIUM 40 MG PO TBEC: 40.0000 mg | DELAYED_RELEASE_TABLET | Freq: Every day | ORAL | 0 refills | Status: DC

## 2018-12-07 MED ORDER — SERTRALINE HCL 100 MG PO TABS: 100.0000 mg | ORAL_TABLET | Freq: Every day | ORAL | 0 refills | Status: DC

## 2018-12-07 MED ORDER — LISINOPRIL 10 MG PO TABS: 5.0000 mg | ORAL_TABLET | Freq: Every day | ORAL | 0 refills | Status: DC

## 2018-12-07 MED ORDER — TICAGRELOR 90 MG PO TABS: 90.0000 mg | ORAL_TABLET | Freq: Two times a day (BID) | ORAL | 0 refills | Status: DC

## 2018-12-07 MED ORDER — ATORVASTATIN CALCIUM 80 MG PO TABS: 80.0000 mg | ORAL_TABLET | Freq: Every day | ORAL | 0 refills | Status: DC

## 2018-12-07 MED ORDER — HYDROXYZINE HCL 25 MG PO TABS: 25.0000 mg | ORAL_TABLET | Freq: Four times a day (QID) | ORAL | 0 refills | Status: DC | PRN

## 2018-12-07 MED ORDER — METOPROLOL TARTRATE 50 MG PO TABS: 50.0000 mg | ORAL_TABLET | Freq: Two times a day (BID) | ORAL | 0 refills | Status: DC

## 2018-12-07 MED ORDER — METFORMIN HCL 500 MG PO TABS: 500.0000 mg | ORAL_TABLET | Freq: Two times a day (BID) | ORAL | 0 refills | Status: DC

## 2018-12-07 NOTE — ED Notes (Signed)
Pt is up and currently getting dressed steady on his feet. Pt states he is now leaving.

## 2018-12-07 NOTE — ED Notes (Signed)
Pt is very intoxicated. Pt is unable to follow commands. Pt ripped off his armband and it is laying on the computer. Pt is unstable on his feet. Pt is also agitated with staff at moments.

## 2018-12-07 NOTE — ED Provider Notes (Addendum)
[facility] EMERGENCY DEPARTMENT Provider Note   CSN: [id number] Arrival date & time: 12/07/18  2055     History   Chief Complaint Chief Complaint  Patient presents with  . Alcohol Intoxication    HPI Warren Salas is a 57 y.o. male.  HPI   57 year old male with a history of depression, coronary artery disease, diabetes, substance abuse, presents with concern for alcohol intoxication seen wandering and stumbling at a [employer].  Marland Kitchen History, patient reports he was acutely around unable to say why he was brought into the emergency room.  Denies any pain other than at sites of attempted IV sticks.  Abdominal evaluation of his only concern is.  States, and bleeding that he had had.  Discussed this ideation.  Asking about swelling, and "breaking 3, but complains denies any areas of pain except IV stick site..   Past Medical History:  Diagnosis Date  . Back pain   . COPD (chronic obstructive pulmonary disease) (HCC)   . Diabetes mellitus without complication (HCC)   . Hypertension   . STEMI (ST elevation myocardial infarction) (HCC) 10/2017    Patient Active Problem List   Diagnosis Date Noted  . Severe recurrent major depression w/psychotic features, mood-congruent (HCC) 12/05/2018  . Moderate cocaine use disorder (HCC) 11/16/2018  . MDD (major depressive disorder), recurrent severe, without psychosis (HCC) 10/27/2018  . Alcohol dependence with alcohol-induced mood disorder (HCC)   . CAD (coronary artery disease) 12/12/2017  . Old MI (myocardial infarction) 12/12/2017  . Tobacco abuse 12/12/2017  . Type 2 diabetes mellitus with complication, without long-term current use of insulin (HCC) 12/12/2017  . Acute MI, inferior wall (HCC) 11/01/2017  . Acute inferior myocardial infarction [facility])     Past Surgical History:  Procedure Laterality Date  . APPENDECTOMY    . CORONARY STENT INTERVENTION N/A 12/29/2017   Procedure: CORONARY STENT INTERVENTION - LAD;   Surgeon: Corky Crafts, MD;  Location: [facility] INVASIVE CV LAB;  Service: Cardiovascular;  Laterality: N/A;  . CORONARY/GRAFT ACUTE MI REVASCULARIZATION N/A 11/01/2017   Procedure: Coronary/Graft Acute MI Revascularization;  Surgeon: Corky Crafts, MD;  Location: [facility] INVASIVE CV LAB;  Service: Cardiovascular;  Laterality: N/A;  . LEFT HEART CATH AND CORONARY ANGIOGRAPHY N/A 11/01/2017   Procedure: LEFT HEART CATH AND CORONARY ANGIOGRAPHY;  Surgeon: Corky Crafts, MD;  Location: [facility] INVASIVE CV LAB;  Service: Cardiovascular;  Laterality: N/A;        Home Medications    Prior to Admission medications   Medication Sig Start Date End Date Taking? Authorizing Provider  albuterol (PROVENTIL HFA;VENTOLIN HFA) 108 (90 Base) MCG/ACT inhaler Inhale 1-2 puffs into the lungs every 6 (six) hours as needed for wheezing. 11/22/18   Micheal Likens, MD  aspirin EC 81 MG tablet Take 1 tablet (81 mg total) by mouth daily. 11/22/18   Micheal Likens, MD  atorvastatin (LIPITOR) 80 MG tablet Take 1 tablet (80 mg total) by mouth daily at 6 PM. For high cholesterol 12/07/18   Money, Gerlene Burdock, FNP  budesonide-formoterol (SYMBICORT) 160-4.5 MCG/ACT inhaler Inhale 2 puffs into the lungs 2 (two) times daily as needed (For shortness of breath.). 11/22/18   Micheal Likens, MD  hydrOXYzine (ATARAX/VISTARIL) 25 MG tablet Take 1 tablet (25 mg total) by mouth every 6 (six) hours as needed for anxiety. 12/07/18   Money, Gerlene Burdock, FNP  isosorbide mononitrate (IMDUR) 30 MG 24 hr tablet Take 1 tablet (30 mg total) by mouth daily.  12/08/18   Money, Gerlene Burdock, FNP  lisinopril (PRINIVIL,ZESTRIL) 10 MG tablet Take 0.5 tablets (5 mg total) by mouth daily. For high blood pressure 12/07/18   Money, Gerlene Burdock, FNP  metFORMIN (GLUCOPHAGE) 500 MG tablet Take 1 tablet (500 mg total) by mouth 2 (two) times daily. For high blood sugar 12/07/18   Money, Gerlene Burdock, FNP  metoprolol tartrate (LOPRESSOR) 50 MG  tablet Take 1 tablet (50 mg total) by mouth 2 (two) times daily. For high blood pressure 12/07/18   Money, Gerlene Burdock, FNP  nitroGLYCERIN (NITROSTAT) 0.4 MG SL tablet Place 1 tablet (0.4 mg total) under the tongue every 5 (five) minutes as needed. 11/22/18   Micheal Likens, MD  pantoprazole (PROTONIX) 40 MG tablet Take 1 tablet (40 mg total) by mouth daily. 12/08/18   Money, Gerlene Burdock, FNP  sertraline (ZOLOFT) 100 MG tablet Take 1 tablet (100 mg total) by mouth daily. For mood control 12/08/18   Money, Gerlene Burdock, FNP  ticagrelor (BRILINTA) 90 MG TABS tablet Take 1 tablet (90 mg total) by mouth 2 (two) times daily. 12/07/18   Money, Gerlene Burdock, FNP    Family History Family History  Problem Relation Age of Onset  . Hypertension Mother   . Diabetes Mother   . Hypertension Father   . Diabetes Father     Social History Social History   Tobacco Use  . Smoking status: Current Every Day Smoker    Packs/day: 1.00    Types: Cigarettes  . Smokeless tobacco: Never Used  Substance Use Topics  . Alcohol use: Yes    Alcohol/week: 12.0 standard drinks    Types: 12 Cans of beer per week  . Drug use: Yes    Types: Cocaine    Comment: last use 3-4 days ago.     Allergies   Patient has no known allergies.   Review of Systems Review of Systems  Unable to perform ROS: Other     Physical Exam Updated Vital Signs BP 110/80 (BP Location: Right Arm)   Pulse 75   Temp 98 F (36.7 C) (Oral)   Resp 16   Ht 5\' 6"  (1.676 m)   Wt 69.4 kg   SpO2 96%   BMI 24.69 kg/m   Physical Exam Vitals signs and nursing note reviewed.  Constitutional:      General: He is not in acute distress.    Appearance: He is well-developed. He is not diaphoretic.     Comments: intoxicated  HENT:     Head: Normocephalic and atraumatic.  Eyes:     Extraocular Movements: Extraocular movements intact.     Conjunctiva/sclera: Conjunctivae normal.     Pupils: Pupils are equal, round, and reactive to light.    Neck:     Musculoskeletal: Normal range of motion.  Cardiovascular:     Rate and Rhythm: Normal rate and regular rhythm.     Heart sounds: Normal heart sounds. No murmur. No friction rub. No gallop.   Pulmonary:     Effort: Pulmonary effort is normal. No respiratory distress.     Breath sounds: Normal breath sounds. No wheezing or rales.  Abdominal:     General: There is no distension.     Palpations: Abdomen is soft.     Tenderness: There is no abdominal tenderness. There is no guarding.  Skin:    General: Skin is warm and dry.  Neurological:     Mental Status: He is alert and oriented to person, place, and  time.      ED Treatments / Results  Labs (all labs ordered are listed, but only abnormal results are displayed) Labs Reviewed - No data to display  EKG None  Radiology No results found.  Procedures Procedures (including critical care time)  Medications Ordered in ED Medications - No data to display   Initial Impression / Assessment and Plan / ED Course  I have reviewed the triage vital signs and the nursing notes.  Pertinent labs & imaging results that were available during my care of the patient were reviewed by me and considered in my medical decision making (see chart for details).     57 year old male with a history of depression, coronary artery disease, diabetes, substance abuse, presents with concern for alcohol intoxication after he was seen wandering and stumbling at a [employer].  He has no evidence of significant trauma on history or physical.  No sign of skull fracture on exam. Doubt intracranial hemorrhage. He is uncooperative with a history for the most part.  After sitting in the emergency department, he now cinically sober, the ambulating independently with a steady gait, is requesting food and discharge.  Final Clinical Impressions(s) / ED Diagnoses   Final diagnoses:  Alcoholic intoxication without complication [facility](HCC)    ED Discharge Orders     None       Alvira MondaySchlossman, Windel Keziah, MD 12/08/18 [id number]    Alvira MondaySchlossman, Tahjae Clausing, MD 12/08/18 [phone]

## 2018-12-07 NOTE — Progress Notes (Signed)
Pt was observed sitting in the dayroom drinking coffee and watching TV after the group time was over.  He had stayed in his room until group was finished.  Pt states he was feeling tired and did not want to go to group.  Pt states he has passive suicidal thoughts that come and go, but he can contract for safety.  He denies HI/AVH.  He denies having any withdrawal symptoms at this time.  Pt was observed with minimal interaction with the other patients this evening, although he spent part of the evening watching TV.  Pt makes his needs known to staff.  He voiced no needs or concerns to Clinical research associatewriter.  Support and encouragement offered.  Discharge plans are in process.  Safety maintained with q15 minute checks.

## 2018-12-07 NOTE — Progress Notes (Signed)
Nursing discharge note: Patient discharged home per MD order.  Patient received all personal belongings from unit and locker. Patient received prescriptions. Reviewed AVS/transition record with patient and he indicates understanding.  Patient will follow up with [facility].  [facility]  Patient denies any thoughts of self harm.  He left ambulatory with 2 bus passes.

## 2018-12-07 NOTE — ED Notes (Signed)
Pt refuses to repeat vitals, monitor has been turned off. Pt is cussing at the nursing staff. Pt is very agitated and not cooperative.

## 2018-12-07 NOTE — ED Notes (Signed)
Patient verbalizes understanding of discharge instructions. Opportunity for questioning and answers were provided. Armband removed by staff, pt discharged from ED ambulatory to home.  

## 2018-12-07 NOTE — Discharge Summary (Signed)
Physician Discharge Summary Note  Patient:  Warren Salas is an 57 y.o., male MRN:  [id number] DOB:  03-06-1961 Patient phone:  [phone] (home)  Patient address:   [address] [place] [address],  Total Time spent with patient: 20 minutes  Date of Admission:  12/05/2018 Date of Discharge: 12/07/18  Reason for Admission:  Alcohol abuse with depression and SI  Principal Problem: Severe recurrent major depression w/psychotic features, mood-congruent [facility]) Discharge Diagnoses: Principal Problem:   Severe recurrent major depression w/psychotic features, mood-congruent (HCC)   Past Psychiatric History: As below in hospital course, is pretty extensive reports of symptoms have been variable over the last 48 hours  Past Medical History:  Past Medical History:  Diagnosis Date  . Back pain   . COPD (chronic obstructive pulmonary disease) (HCC)   . Diabetes mellitus without complication (HCC)   . Hypertension   . STEMI (ST elevation myocardial infarction) (HCC) 10/2017    Past Surgical History:  Procedure Laterality Date  . APPENDECTOMY    . CORONARY STENT INTERVENTION N/A 12/29/2017   Procedure: CORONARY STENT INTERVENTION - LAD;  Surgeon: Corky Crafts, MD;  Location: [facility] INVASIVE CV LAB;  Service: Cardiovascular;  Laterality: N/A;  . CORONARY/GRAFT ACUTE MI REVASCULARIZATION N/A 11/01/2017   Procedure: Coronary/Graft Acute MI Revascularization;  Surgeon: Corky Crafts, MD;  Location: [facility] INVASIVE CV LAB;  Service: Cardiovascular;  Laterality: N/A;  . LEFT HEART CATH AND CORONARY ANGIOGRAPHY N/A 11/01/2017   Procedure: LEFT HEART CATH AND CORONARY ANGIOGRAPHY;  Surgeon: Corky Crafts, MD;  Location: [facility] INVASIVE CV LAB;  Service: Cardiovascular;  Laterality: N/A;   Family History:  Family History  Problem Relation Age of Onset  . Hypertension Mother   . Diabetes Mother   . Hypertension Father   . Diabetes Father    Family Psychiatric  History:  Unknown Social History:  Social History   Substance and Sexual Activity  Alcohol Use Yes  . Alcohol/week: 12.0 standard drinks  . Types: 12 Cans of beer per week     Social History   Substance and Sexual Activity  Drug Use Yes  . Types: Cocaine   Comment: last use 3-4 days ago.    Social History   Socioeconomic History  . Marital status: Divorced    Spouse name: Not on file  . Number of children: 3  . Years of education: Not on file  . Highest education level: Not on file  Occupational History  . Not on file  Social Needs  . Financial resource strain: Not on file  . Food insecurity:    Worry: Not on file    Inability: Not on file  . Transportation needs:    Medical: Not on file    Non-medical: Not on file  Tobacco Use  . Smoking status: Current Every Day Smoker    Packs/day: 1.00    Types: Cigarettes  . Smokeless tobacco: Never Used  Substance and Sexual Activity  . Alcohol use: Yes    Alcohol/week: 12.0 standard drinks    Types: 12 Cans of beer per week  . Drug use: Yes    Types: Cocaine    Comment: last use 3-4 days ago.  Marland Kitchen Sexual activity: Never  Lifestyle  . Physical activity:    Days per week: Not on file    Minutes per session: Not on file  . Stress: Not on file  Relationships  . Social connections:    Talks on phone: Not on file  Gets together: Not on file    Attends religious service: Not on file    Active member of club or organization: Not on file    Attends meetings of clubs or organizations: Not on file    Relationship status: Not on file  Other Topics Concern  . Not on file  Social History Narrative  . Not on file    Hospital Course:   12/05/18 [facility] MD Assessment: 57 year old homeless individual who was admitted in both October and November of this year, each time with alcohol intoxication and threats of self-harm Once again patient presents intoxicated, with a blood alcohol level that is 166, drug screen showing benzodiazepines and  cocaine that we denied abusing benzodiazepines stating he was prescribed something "for his nerves" that he cannot recall what it is.  States he is suicidal because he is living [place] the woods in a tent and did not take his antidepressants. Basically came to our attention because [facility] was called as he was standing on the side of the road and bystanders were concerned.  Complained of chest pain with EMS but did not comply with treatment then he presented emergency department with a chief complaint of chest pain and then elaborated he just wanted to be "out of this world" again was given aspirin but spit it out was not cooperative with EMS or ER staff/gave verbal reports the note of 9:20 AM on 12/8 indicated he was not in fact suicidal but at any rate there was concern the bystanders thought he might [place]. He also elaborated other examiners in the emergency department that he heard voices telling him to kill himself but he denied this to me it may be simply issues of secondary gain propelling symptom exaggeration at any rate he is admitted for detox and stabilization. States he is been detox before with "meds" that worked but he cannot recall what they were he is already been started on the lorazepam protocol this blood pressure stable he has no tremors at this point.  He denies history of seizures or DTs or hallucinations when coming off of alcohol Other diagnoses include history of depression recurrent severe without psychosis/alcohol dependence with alcohol induced mood disorder/history of MI/history of type 2 diabetes/coronary disease/cocaine abuse and dependence in the past/hypertension  Patient remained on the [facility] unit for 2 days. The patient stabilized on medication and therapy. Patient was discharged on Zoloft 100 mg Daily and all medications for medical issues. Patient has shown improvement with improved mood, affect, sleep, appetite, and interaction. Patient  has attended group and participated. Patient has been seen in the day room interacting with peers and staff appropriately. Patient denies any SI/HI/AVH and contracts for safety. Patient agrees to follow up at [facility], [facility], and [facility]. Patient is provided with prescriptions for their medications upon discharge.   Physical Findings: AIMS: Facial and Oral Movements Muscles of Facial Expression: None, normal Lips and Perioral Area: None, normal Jaw: None, normal Tongue: None, normal,Extremity Movements Upper (arms, wrists, hands, fingers): None, normal Lower (legs, knees, ankles, toes): None, normal, Trunk Movements Neck, shoulders, hips: None, normal, Overall Severity Severity of abnormal movements (highest score from questions above): None, normal Incapacitation due to abnormal movements: None, normal Patient's awareness of abnormal movements (rate only patient's report): No Awareness, Dental Status Current problems with teeth and/or dentures?: No Does patient usually wear dentures?: No  CIWA:  CIWA-Ar Total: 1 COWS:  COWS Total Score: 0  Musculoskeletal: Strength & Muscle Tone: within normal limits Gait & Station: normal Patient leans: N/A  Psychiatric Specialty Exam: Physical Exam  Nursing note and vitals reviewed. Constitutional: He is oriented to person, place, and time. He appears well-developed and well-nourished.  Cardiovascular: Normal rate.  Respiratory: Effort normal.  Musculoskeletal: Normal range of motion.  Neurological: He is alert and oriented to person, place, and time.  Skin: Skin is warm.    Review of Systems  Constitutional: Negative.   HENT: Negative.   Eyes: Negative.   Respiratory: Negative.   Cardiovascular: Negative.   Gastrointestinal: Negative.   Genitourinary: Negative.   Musculoskeletal: Negative.   Skin: Negative.   Neurological: Negative.   Endo/Heme/Allergies: Negative.   Psychiatric/Behavioral: Positive for suicidal  ideas (Chronically suicidal for secondary gain).    Blood pressure 135/75, pulse 71, temperature 98.5 F (36.9 C), temperature source Oral, resp. rate 20, height 5\' 6"  (1.676 m), weight 72.6 kg.Body mass index is 25.82 kg/m.  General Appearance: Casual  Eye Contact:  Good  Speech:  Clear and Coherent and Normal Rate  Volume:  Normal  Mood:  Depressed  Affect:  Flat  Thought Process:  Coherent and Descriptions of Associations: Intact  Orientation:  Full (Time, Place, and Person)  Thought Content:  WDL  Suicidal Thoughts:  Patient reports chronic suicidality to get admissions and to prevent discharge  Homicidal Thoughts:  No  Memory:  Immediate;   Good Recent;   Good Remote;   Good  Judgement:  Fair  Insight:  Fair  Psychomotor Activity:  Normal  Concentration:  Concentration: Good and Attention Span: Good  Recall:  Good  Fund of Knowledge:  Good  Language:  Good  Akathisia:  No  Handed:  Right  AIMS (if indicated):     Assets:  Communication Skills Desire for Improvement Resilience Social Support Transportation  ADL's:  Intact  Cognition:  WNL  Sleep:  Number of Hours: 5.5     Have you used any form of tobacco in the last 30 days? (Cigarettes, Smokeless Tobacco, Cigars, and/or Pipes): Yes  Has this patient used any form of tobacco in the last 30 days? (Cigarettes, Smokeless Tobacco, Cigars, and/or Pipes) Yes, Yes, A prescription for an FDA-approved tobacco cessation medication was offered at discharge and the patient refused  Blood Alcohol level:  Lab Results  Component Value Date   ETH 166 (H) 12/04/2018   ETH 250 (H) 12/03/2018    Metabolic Disorder Labs:  Lab Results  Component Value Date   HGBA1C 5.2 11/17/2018   MPG 102.54 11/17/2018   MPG 128.37 11/01/2017   No results found for: PROLACTIN Lab Results  Component Value Date   CHOL 109 11/17/2018   TRIG 89 11/17/2018   HDL 60 11/17/2018   CHOLHDL 1.8 11/17/2018   VLDL 18 11/17/2018   LDLCALC 31  11/17/2018   LDLCALC 48 11/01/2017    See Psychiatric Specialty Exam and Suicide Risk Assessment completed by Attending Physician prior to discharge.  Discharge destination:  Home  Is patient on multiple antipsychotic therapies at discharge:  No   Has Patient had three or more failed trials of antipsychotic monotherapy by history:  No  Recommended Plan for Multiple Antipsychotic Therapies: NA   Allergies as of 12/07/2018   No Known Allergies     Medication List    TAKE these medications     Indication  albuterol 108 (90 Base) MCG/ACT inhaler Commonly known as:  PROVENTIL HFA;VENTOLIN HFA Inhale 1-2 puffs into the lungs  every 6 (six) hours as needed for wheezing.  Indication:  Asthma   aspirin EC 81 MG tablet Take 1 tablet (81 mg total) by mouth daily.  Indication:  History of myocardial infarction   atorvastatin 80 MG tablet Commonly known as:  LIPITOR Take 1 tablet (80 mg total) by mouth daily at 6 PM. For high cholesterol What changed:  additional instructions  Indication:  High Amount of Fats in the Blood   budesonide-formoterol 160-4.5 MCG/ACT inhaler Commonly known as:  SYMBICORT Inhale 2 puffs into the lungs 2 (two) times daily as needed (For shortness of breath.).  Indication:  Asthma   hydrOXYzine 25 MG tablet Commonly known as:  ATARAX/VISTARIL Take 1 tablet (25 mg total) by mouth every 6 (six) hours as needed for anxiety. What changed:    medication strength  how much to take  Indication:  Feeling Anxious   isosorbide mononitrate 30 MG 24 hr tablet Commonly known as:  IMDUR Take 1 tablet (30 mg total) by mouth daily. Start taking on:  December 08, 2018  Indication:  History of coronary artery disease   lisinopril 10 MG tablet Commonly known as:  PRINIVIL,ZESTRIL Take 0.5 tablets (5 mg total) by mouth daily. For high blood pressure What changed:  additional instructions  Indication:  High Blood Pressure Disorder   metFORMIN 500 MG  tablet Commonly known as:  GLUCOPHAGE Take 1 tablet (500 mg total) by mouth 2 (two) times daily. For high blood sugar What changed:  additional instructions  Indication:  Type 2 Diabetes   metoprolol tartrate 50 MG tablet Commonly known as:  LOPRESSOR Take 1 tablet (50 mg total) by mouth 2 (two) times daily. For high blood pressure What changed:  additional instructions  Indication:  High Blood Pressure Disorder   nitroGLYCERIN 0.4 MG SL tablet Commonly known as:  NITROSTAT Place 1 tablet (0.4 mg total) under the tongue every 5 (five) minutes as needed.  Indication:  Acute Angina Pectoris   pantoprazole 40 MG tablet Commonly known as:  PROTONIX Take 1 tablet (40 mg total) by mouth daily. Start taking on:  December 08, 2018  Indication:  Gastroesophageal Reflux Disease   sertraline 100 MG tablet Commonly known as:  ZOLOFT Take 1 tablet (100 mg total) by mouth daily. For mood control Start taking on:  December 08, 2018 What changed:    medication strength  how much to take  additional instructions  Indication:  Major Depressive Disorder   ticagrelor 90 MG Tabs tablet Commonly known as:  BRILINTA Take 1 tablet (90 mg total) by mouth 2 (two) times daily.  Indication:  Acute Coronary Syndrome      Follow-up Information    [facility] Follow up.   Specialty:  Behavioral Health Why:  Walk in within 3 days of residential treatment discharge to be assessed for outpatient mental health services including medication management and therapy. Walk in hours: Mon-Fri 8am-10am. Thank you.  Contact information: [address] [place] [address] [phone]        [facility] Follow up on 12/05/2018.   Why:  Referral made on 12/06/18 Contact information: [place] [place] [address] [phone]        [facility] Follow up.   Specialty:  Addiction Medicine Why:  Referral made: 12/05/18 Contact information: [address]  [place] [address] [phone]           Follow-up recommendations:  Continue activity as tolerated. Continue diet  as recommended by your PCP. Ensure to keep all appointments with outpatient providers.  Comments:  Patient is instructed prior to discharge to: Take all medications as prescribed by his/her mental healthcare provider. Report any adverse effects and or reactions from the medicines to his/her outpatient provider promptly. Patient has been instructed & cautioned: To not engage in alcohol and or illegal drug use while on prescription medicines. In the event of worsening symptoms, patient is instructed to call the crisis hotline, 911 and or go to the nearest ED for appropriate evaluation and treatment of symptoms. To follow-up with his/her primary care provider for your other medical issues, concerns and or health care needs.    Signed: Gerlene Burdock Nashton Belson, FNP 12/07/2018, 8:45 AM

## 2018-12-07 NOTE — ED Notes (Signed)
Pt ambulated in hallway with no assistance  

## 2018-12-07 NOTE — ED Triage Notes (Signed)
Pt brought in by [facility] [employer] [facility] lot. Pt was found intoxicated wondering around and falling. Pt has a hx of this.  BP 100/80 HR 75 RA 100% RR 16

## 2018-12-07 NOTE — Plan of Care (Signed)
Problem: Safety: Goal: Periods of time without injury will increase Outcome: Completed/Met   Problem: Education: Goal: Knowledge of [facility] Education information/materials will improve Outcome: Completed/Met Goal: Emotional status will improve Outcome: Completed/Met Goal: Mental status will improve Outcome: Completed/Met Goal: Verbalization of understanding the information provided will improve Outcome: Completed/Met   Problem: Activity: Goal: Interest or engagement in activities will improve Outcome: Completed/Met Goal: Sleeping patterns will improve Outcome: Completed/Met   Problem: Coping: Goal: Ability to verbalize frustrations and anger appropriately will improve Outcome: Completed/Met Goal: Ability to demonstrate self-control will improve Outcome: Completed/Met   Problem: Health Behavior/Discharge Planning: Goal: Identification of resources available to assist in meeting health care needs will improve Outcome: Completed/Met Goal: Compliance with treatment plan for underlying cause of condition will improve Outcome: Completed/Met   Problem: Physical Regulation: Goal: Ability to maintain clinical measurements within normal limits will improve Outcome: Completed/Met   Problem: Safety: Goal: Periods of time without injury will increase Outcome: Completed/Met   Problem: Education: Goal: Knowledge of disease or condition will improve Outcome: Completed/Met Goal: Understanding of discharge needs will improve Outcome: Completed/Met   Problem: Health Behavior/Discharge Planning: Goal: Ability to identify changes in lifestyle to reduce recurrence of condition will improve Outcome: Completed/Met Goal: Identification of resources available to assist in meeting health care needs will improve Outcome: Completed/Met   Problem: Physical Regulation: Goal: Complications related to the disease process, condition or treatment will be avoided or minimized Outcome:  Completed/Met   Problem: Safety: Goal: Ability to remain free from injury will improve Outcome: Completed/Met   Problem: Education: Goal: Utilization of techniques to improve thought processes will improve Outcome: Completed/Met Goal: Knowledge of the prescribed therapeutic regimen will improve Outcome: Completed/Met   Problem: Activity: Goal: Interest or engagement in leisure activities will improve Outcome: Completed/Met Goal: Imbalance in normal sleep/wake cycle will improve Outcome: Completed/Met   Problem: Coping: Goal: Coping ability will improve Outcome: Completed/Met Goal: Will verbalize feelings Outcome: Completed/Met   Problem: Health Behavior/Discharge Planning: Goal: Ability to make decisions will improve Outcome: Completed/Met Goal: Compliance with therapeutic regimen will improve Outcome: Completed/Met   Problem: Role Relationship: Goal: Will demonstrate positive changes in social behaviors and relationships Outcome: Completed/Met   Problem: Safety: Goal: Ability to disclose and discuss suicidal ideas will improve Outcome: Completed/Met Goal: Ability to identify and utilize support systems that promote safety will improve Outcome: Completed/Met   Problem: Self-Concept: Goal: Will verbalize positive feelings about self Outcome: Completed/Met Goal: Level of anxiety will decrease Outcome: Completed/Met   Problem: Education: Goal: Ability to make informed decisions regarding treatment will improve Outcome: Completed/Met   Problem: Coping: Goal: Coping ability will improve Outcome: Completed/Met   Problem: Health Behavior/Discharge Planning: Goal: Identification of resources available to assist in meeting health care needs will improve Outcome: Completed/Met   Problem: Medication: Goal: Compliance with prescribed medication regimen will improve Outcome: Completed/Met   Problem: Self-Concept: Goal: Ability to disclose and discuss suicidal ideas  will improve Outcome: Completed/Met Goal: Will verbalize positive feelings about self Outcome: Completed/Met   Problem: Activity: Goal: Will verbalize the importance of balancing activity with adequate rest periods Outcome: Completed/Met   Problem: Education: Goal: Will be free of psychotic symptoms Outcome: Completed/Met Goal: Knowledge of the prescribed therapeutic regimen will improve Outcome: Completed/Met   Problem: Coping: Goal: Coping ability will improve Outcome: Completed/Met Goal: Will verbalize feelings Outcome: Completed/Met   Problem: Health Behavior/Discharge Planning: Goal: Compliance with prescribed medication regimen will improve Outcome: Completed/Met   Problem: Nutritional: Goal: Ability to achieve   adequate nutritional intake will improve Outcome: Completed/Met   Problem: Role Relationship: Goal: Ability to communicate needs accurately will improve Outcome: Completed/Met Goal: Ability to interact with others will improve Outcome: Completed/Met   Problem: Safety: Goal: Ability to redirect hostility and anger into socially appropriate behaviors will improve Outcome: Completed/Met Goal: Ability to remain free from injury will improve Outcome: Completed/Met   Problem: Self-Care: Goal: Ability to participate in self-care as condition permits will improve Outcome: Completed/Met   Problem: Self-Concept: Goal: Will verbalize positive feelings about self Outcome: Completed/Met

## 2018-12-07 NOTE — [facility] Suicide Risk Assessment (Signed)
[facility]   Principal Problem: Severe recurrent major depression w/psychotic features, mood-congruent (HCC) Discharge Diagnoses: Principal Problem:   Severe recurrent major depression w/psychotic features, mood-congruent (HCC)   Total Time spent with patient: 45 minutes  Musculoskeletal: Strength & Muscle Tone: within normal limits Gait & Station: normal Patient leans: N/A  Psychiatric Specialty Exam: ROS  Blood pressure 135/75, pulse 71, temperature 98.5 F (36.9 C), temperature source Oral, resp. rate 20, height 5\' 6"  (1.676 m), weight 72.6 kg.Body mass index is 25.82 kg/m.  General Appearance: Casual  Eye Contact::  Fair  Speech:  Clear and Coherent409  Volume:  Normal  Mood: Labile  Affect: Thought to be baseline  Thought Process:  Coherent  Orientation:  Full (Time, Place, and Person)  Thought Content:  Logical  Suicidal Thoughts:  No  Homicidal Thoughts:  No  Memory:  Immediate;   Good  Judgement:  Good  Insight:  Good  Psychomotor Activity:  Normal  Concentration:  Good  Recall:  Good  Fund of Knowledge:Good  Language: Intact  Akathisia:  Negative  Handed:  Right  AIMS (if indicated):     Assets:  Communication Skills Desire for Improvement  Sleep:  Number of Hours: 5.5  Cognition: WNL  ADL's:  Intact   Mental Status Per Nursing Assessment::   On Admission:  Suicidal ideation indicated by patient, Self-harm thoughts  Demographic Factors:  Male  Loss Factors: Decrease in vocational status  Historical Factors: Continued substance abuse  Risk Reduction Factors:   NA  Continued Clinical Symptoms:  Dysthymia  Cognitive Features That Contribute To Risk:  None    Suicide Risk:  Mild:  Suicidal ideation of limited frequency, intensity, duration, and specificity.  There are no identifiable plans, no associated intent, mild dysphoria and related symptoms, good self-control (both objective and subjective assessment), few  other risk factors, and identifiable protective factors, including available and accessible social support.  Follow-up Information    [facility] Follow up.   Specialty:  Behavioral Health Why:  Walk in within 3 days of residential treatment discharge to be assessed for outpatient mental health services including medication management and therapy. Walk in hours: Mon-Fri 8am-10am. Thank you.  Contact information: [address] [place] [address] [phone]        [facility] Follow up on 12/05/2018.   Why:  Referral made on 12/06/18 Contact information: [place] [place] [address] [phone]        [facility] Follow up.   Specialty:  Addiction Medicine Why:  Referral made: 12/05/18 Contact information: [address] [place] [address] [phone]          Main risks with this individual are discerning real from malingered suicidal threats as he is homeless and frequently seeks housing in the hospital.  Plan Of Care/Follow-up recommendations:  Activity:  full  Edythe Riches, MD 12/07/2018, 9:11 AM

## 2018-12-08 ENCOUNTER — Encounter (HOSPITAL_COMMUNITY): Payer: Self-pay | Admitting: Internal Medicine

## 2018-12-08 ENCOUNTER — Other Ambulatory Visit: Payer: Self-pay

## 2018-12-08 ENCOUNTER — Emergency Department (HOSPITAL_COMMUNITY)
Admission: EM | Admit: 2018-12-08 | Discharge: 2018-12-08 | Disposition: A | Discharge status: Home / Self Care | Payer: Medicare Other | Attending: Emergency Medicine | Admitting: Emergency Medicine

## 2018-12-08 ENCOUNTER — Emergency Department (HOSPITAL_COMMUNITY): Payer: Medicare Other

## 2018-12-08 ENCOUNTER — Encounter (HOSPITAL_COMMUNITY): Payer: Self-pay

## 2018-12-08 ENCOUNTER — Emergency Department (EMERGENCY_DEPARTMENT_HOSPITAL)
Admission: EM | Admit: 2018-12-08 | Discharge: 2018-12-09 | Disposition: A | Discharge status: Home / Self Care | Payer: Medicare Other | Source: Home / Self Care | Attending: Emergency Medicine | Admitting: Emergency Medicine

## 2018-12-08 DIAGNOSIS — Z7982 Long term (current) use of aspirin: Secondary | ICD-10-CM | POA: Insufficient documentation

## 2018-12-08 DIAGNOSIS — I10 Essential (primary) hypertension: Secondary | ICD-10-CM | POA: Diagnosis not present

## 2018-12-08 DIAGNOSIS — F1010 Alcohol abuse, uncomplicated: Secondary | ICD-10-CM | POA: Diagnosis not present

## 2018-12-08 DIAGNOSIS — R079 Chest pain, unspecified: Secondary | ICD-10-CM | POA: Diagnosis not present

## 2018-12-08 DIAGNOSIS — Z79899 Other long term (current) drug therapy: Secondary | ICD-10-CM | POA: Diagnosis not present

## 2018-12-08 DIAGNOSIS — E119 Type 2 diabetes mellitus without complications: Secondary | ICD-10-CM | POA: Diagnosis not present

## 2018-12-08 DIAGNOSIS — F17210 Nicotine dependence, cigarettes, uncomplicated: Secondary | ICD-10-CM | POA: Diagnosis not present

## 2018-12-08 DIAGNOSIS — Z7984 Long term (current) use of oral hypoglycemic drugs: Secondary | ICD-10-CM | POA: Insufficient documentation

## 2018-12-08 DIAGNOSIS — J449 Chronic obstructive pulmonary disease, unspecified: Secondary | ICD-10-CM | POA: Diagnosis not present

## 2018-12-08 DIAGNOSIS — I252 Old myocardial infarction: Secondary | ICD-10-CM | POA: Diagnosis not present

## 2018-12-08 DIAGNOSIS — G4489 Other headache syndrome: Secondary | ICD-10-CM | POA: Diagnosis not present

## 2018-12-08 DIAGNOSIS — F29 Unspecified psychosis not due to a substance or known physiological condition: Secondary | ICD-10-CM | POA: Diagnosis not present

## 2018-12-08 DIAGNOSIS — F1024 Alcohol dependence with alcohol-induced mood disorder: Secondary | ICD-10-CM

## 2018-12-08 DIAGNOSIS — R0789 Other chest pain: Secondary | ICD-10-CM | POA: Diagnosis not present

## 2018-12-08 DIAGNOSIS — R0902 Hypoxemia: Secondary | ICD-10-CM | POA: Diagnosis not present

## 2018-12-08 DIAGNOSIS — R45851 Suicidal ideations: Secondary | ICD-10-CM | POA: Diagnosis not present

## 2018-12-08 LAB — CBC
HCT: 50.1 % (ref 39.0–52.0)
Hemoglobin: 16.7 g/dL (ref 13.0–17.0)
MCH: 33.8 pg (ref 26.0–34.0)
MCHC: 33.3 g/dL (ref 30.0–36.0)
MCV: 101.4 fL — ABNORMAL HIGH (ref 80.0–100.0)
Platelets: 205 10*3/uL (ref 150–400)
RBC: 4.94 MIL/uL (ref 4.22–5.81)
RDW: 12.2 % (ref 11.5–15.5)
WBC: 9.8 10*3/uL (ref 4.0–10.5)
nRBC: 0 % (ref 0.0–0.2)

## 2018-12-08 LAB — COMPREHENSIVE METABOLIC PANEL
ALT: 20 U/L (ref 0–44)
AST: 23 U/L (ref 15–41)
Albumin: 4.5 g/dL (ref 3.5–5.0)
Alkaline Phosphatase: 66 U/L (ref 38–126)
Anion gap: 14 (ref 5–15)
BUN: 11 mg/dL (ref 6–20)
CO2: 21 mmol/L — ABNORMAL LOW (ref 22–32)
Calcium: 9.4 mg/dL (ref 8.9–10.3)
Chloride: 108 mmol/L (ref 98–111)
Creatinine, Ser: 0.8 mg/dL (ref 0.61–1.24)
GFR calc Af Amer: >60 mL/min (ref >60)
GFR calc non Af Amer: >60 mL/min (ref >60)
Glucose, Bld: 93 mg/dL (ref 70–99)
POTASSIUM: 3.9 mmol/L (ref 3.5–5.1)
Sodium: 143 mmol/L (ref 135–145)
Total Bilirubin: 0.7 mg/dL (ref 0.3–1.2)
Total Protein: 8 g/dL (ref 6.5–8.1)

## 2018-12-08 LAB — I-STAT CHEM 8, ED
BUN: 8 mg/dL (ref 6–20)
CALCIUM ION: 1.14 mmol/L — AB (ref 1.15–1.40)
Chloride: 107 mmol/L (ref 98–111)
Creatinine, Ser: 0.8 mg/dL (ref 0.61–1.24)
Glucose, Bld: 100 mg/dL — ABNORMAL HIGH (ref 70–99)
HCT: 45 % (ref 39.0–52.0)
Hemoglobin: 15.3 g/dL (ref 13.0–17.0)
Potassium: 4.1 mmol/L (ref 3.5–5.1)
Sodium: 143 mmol/L (ref 135–145)
TCO2: 26 mmol/L (ref 22–32)

## 2018-12-08 LAB — I-STAT TROPONIN, ED: TROPONIN I, POC: 0 ng/mL (ref 0.00–0.08)

## 2018-12-08 LAB — ACETAMINOPHEN LEVEL: Acetaminophen (Tylenol), Serum: <10 ug/mL — ABNORMAL LOW (ref 10–30)

## 2018-12-08 LAB — RAPID URINE DRUG SCREEN, HOSP PERFORMED
Amphetamines: NOT DETECTED
Barbiturates: NOT DETECTED
Benzodiazepines: NOT DETECTED
Cocaine: NOT DETECTED
Opiates: NOT DETECTED
Tetrahydrocannabinol: NOT DETECTED

## 2018-12-08 LAB — SALICYLATE LEVEL: Salicylate Lvl: <7 mg/dL (ref 2.8–30.0)

## 2018-12-08 LAB — ETHANOL: Alcohol, Ethyl (B): 268 mg/dL — ABNORMAL HIGH (ref <10)

## 2018-12-08 MED ORDER — ACETAMINOPHEN 500 MG PO TABS
1000.0000 mg | ORAL_TABLET | Freq: Once | ORAL | Status: AC
  Administered 2018-12-08: 1000 mg via ORAL
  Filled 2018-12-08: qty 2

## 2018-12-08 NOTE — ED Notes (Signed)
Bed: [facility] Expected date:  Expected time:  Means of arrival:  Comments: 

## 2018-12-08 NOTE — ED Triage Notes (Signed)
Pt called EMS for chest pain. When they got there, he denied it and stated that he was instead suicidal. [facility]. Intoxicated and cursing/yelling at staff.

## 2018-12-08 NOTE — ED Notes (Signed)
Patient verbalizes understanding of discharge instructions. Opportunity for questioning and answers were provided. Armband removed by staff, pt discharged from ED.  

## 2018-12-08 NOTE — ED Triage Notes (Signed)
Pt here c/o chest pain that he describes as "someone is stabbing me."  Pt states that he has been "hurting for days" and that "it just keeps getting worse and worse." Vital signs stable at this time. A/O x4. Stretcher locked in lowest position. Call light within reach.

## 2018-12-08 NOTE — Discharge Instructions (Signed)
Your chest pain is not correlating with any abnormal findings on your blood work and EKG and chest xray - this means that it is unlikely that you are having a heart attack - but you MUST stop smoking and you MUST see your cardiologist in follow up - I have included the phone number for the cardiologist above.  Thank you for letting us take care of you today!  Please obtain all of your results from medical records or have your doctors office obtain the results - share them with your doctor - you should be seen at your doctors office in the next 2 days. Call today to arrange your follow up. Take the medications as prescribed. Please review all of the medicines and only take them if you do not have an allergy to them. Please be aware that if you are taking birth control pills, taking other prescriptions, ESPECIALLY ANTIBIOTICS may make the birth control ineffective - if this is the case, either do not engage in sexual activity or use alternative methods of birth control such as condoms until you have finished the medicine and your family doctor says it is OK to restart them. If you are on a blood thinner such as COUMADIN, be aware that any other medicine that you take may cause the coumadin to either work too much, or not enough - you should have your coumadin level rechecked in next 7 days if this is the case.  ?  It is also a possibility that you have an allergic reaction to any of the medicines that you have been prescribed - Everybody reacts differently to medications and while MOST people have no trouble with most medicines, you may have a reaction such as nausea, vomiting, rash, swelling, shortness of breath. If this is the case, please stop taking the medicine immediately and contact your physician.   If you were given a medication in the ED such as percocet, vicodin, or morphine, be aware that these medicines are sedating and may change your ability to take care of yourself adequately for several hours  after being given this medicines - you should not drive or take care of small children if you were given this medicine in the Emergency Department or if you have been prescribed these types of medicines. ?   You should return to the ER IMMEDIATELY if you develop severe or worsening symptoms.

## 2018-12-08 NOTE — ED Notes (Signed)
Patient returned to hallway 17 from x-ray

## 2018-12-08 NOTE — ED Notes (Signed)
Patient transported to X-ray 

## 2018-12-08 NOTE — ED Notes (Signed)
Bed: [facility] Expected date:  Expected time:  Means of arrival:  Comments: [place] 57 [place]

## 2018-12-08 NOTE — ED Notes (Signed)
PA in room

## 2018-12-08 NOTE — ED Notes (Signed)
Pt was outside at [facility]. EMS called out for chest pain but patient denied and stated he was suicidal. [facility]

## 2018-12-08 NOTE — ED Notes (Signed)
[facility] Expected date:  Expected time:  Means of arrival:  Comments: [place]

## 2018-12-08 NOTE — ED Notes (Signed)
Bed: WLPT4 Expected date:  Expected time:  Means of arrival:  Comments: 

## 2018-12-08 NOTE — ED Notes (Signed)
Bed: [facility] Expected date:  Expected time:  Means of arrival:  Comments: 

## 2018-12-08 NOTE — ED Provider Notes (Signed)
[facility] EMERGENCY DEPARTMENT Provider Note   CSN: [id number] Arrival date & time: 12/08/18  0708     History   Chief Complaint Chief Complaint  Patient presents with  . Chest Pain    HPI Warren Salas is a 57 y.o. male.  HPI  The patient is a 57 year old male, has a history of COPD, diabetes, hypertension, he also has a history of myocardial infarction and has been seen in the emergency department, was seen yesterday for alcohol intoxication, in early December was seen for suicidal ideation, a couple of days before that for substance abuse, multiple admissions for behavioral health and suicidal ideation, multiple episodes of chest pain, and last had a heart catheterization on December 29, 2017, there was a 90% left anterior descending lesion which was stented with a drug-eluting stent.  The patient reports that he has frequent chest pain since that time, states that today his chest pain is sharp, midsternal, no radiation, states that it hurts with any movement including bending over to take off his shoes.  He states he still smokes "way more than 2 packs a day".  He also has a history of cocaine use.  His drug screen performed on 9 December showed cocaine and benzodiazepines.  Past Medical History:  Diagnosis Date  . Back pain   . COPD (chronic obstructive pulmonary disease) (HCC)   . Diabetes mellitus without complication (HCC)   . Hypertension   . STEMI (ST elevation myocardial infarction) (HCC) 10/2017    Patient Active Problem List   Diagnosis Date Noted  . Severe recurrent major depression w/psychotic features, mood-congruent (HCC) 12/05/2018  . Moderate cocaine use disorder (HCC) 11/16/2018  . MDD (major depressive disorder), recurrent severe, without psychosis (HCC) 10/27/2018  . Alcohol dependence with alcohol-induced mood disorder (HCC)   . CAD (coronary artery disease) 12/12/2017  . Old MI (myocardial infarction) 12/12/2017  . Tobacco abuse  12/12/2017  . Type 2 diabetes mellitus with complication, without long-term current use of insulin (HCC) 12/12/2017  . Acute MI, inferior wall (HCC) 11/01/2017  . Acute inferior myocardial infarction [facility])     Past Surgical History:  Procedure Laterality Date  . APPENDECTOMY    . CORONARY STENT INTERVENTION N/A 12/29/2017   Procedure: CORONARY STENT INTERVENTION - LAD;  Surgeon: Corky Crafts, MD;  Location: [facility] INVASIVE CV LAB;  Service: Cardiovascular;  Laterality: N/A;  . CORONARY/GRAFT ACUTE MI REVASCULARIZATION N/A 11/01/2017   Procedure: Coronary/Graft Acute MI Revascularization;  Surgeon: Corky Crafts, MD;  Location: [facility] INVASIVE CV LAB;  Service: Cardiovascular;  Laterality: N/A;  . LEFT HEART CATH AND CORONARY ANGIOGRAPHY N/A 11/01/2017   Procedure: LEFT HEART CATH AND CORONARY ANGIOGRAPHY;  Surgeon: Corky Crafts, MD;  Location: [facility] INVASIVE CV LAB;  Service: Cardiovascular;  Laterality: N/A;        Home Medications    Prior to Admission medications   Medication Sig Start Date End Date Taking? Authorizing Provider  albuterol (PROVENTIL HFA;VENTOLIN HFA) 108 (90 Base) MCG/ACT inhaler Inhale 1-2 puffs into the lungs every 6 (six) hours as needed for wheezing. 11/22/18   Micheal Likens, MD  aspirin EC 81 MG tablet Take 1 tablet (81 mg total) by mouth daily. 11/22/18   Micheal Likens, MD  atorvastatin (LIPITOR) 80 MG tablet Take 1 tablet (80 mg total) by mouth daily at 6 PM. For high cholesterol 12/07/18   Money, Gerlene Burdock, FNP  budesonide-formoterol (SYMBICORT) 160-4.5 MCG/ACT inhaler Inhale 2 puffs into the lungs  2 (two) times daily as needed (For shortness of breath.). 11/22/18   Micheal Likensainville, Christopher T, MD  hydrOXYzine (ATARAX/VISTARIL) 25 MG tablet Take 1 tablet (25 mg total) by mouth every 6 (six) hours as needed for anxiety. 12/07/18   Money, Gerlene Burdockravis B, FNP  isosorbide mononitrate (IMDUR) 30 MG 24 hr tablet Take 1 tablet (30 mg total) by  mouth daily. 12/08/18   Money, Gerlene Burdockravis B, FNP  lisinopril (PRINIVIL,ZESTRIL) 10 MG tablet Take 0.5 tablets (5 mg total) by mouth daily. For high blood pressure 12/07/18   Money, Gerlene Burdockravis B, FNP  metFORMIN (GLUCOPHAGE) 500 MG tablet Take 1 tablet (500 mg total) by mouth 2 (two) times daily. For high blood sugar 12/07/18   Money, Gerlene Burdockravis B, FNP  metoprolol tartrate (LOPRESSOR) 50 MG tablet Take 1 tablet (50 mg total) by mouth 2 (two) times daily. For high blood pressure 12/07/18   Money, Gerlene Burdockravis B, FNP  nitroGLYCERIN (NITROSTAT) 0.4 MG SL tablet Place 1 tablet (0.4 mg total) under the tongue every 5 (five) minutes as needed. 11/22/18   Micheal Likensainville, Christopher T, MD  pantoprazole (PROTONIX) 40 MG tablet Take 1 tablet (40 mg total) by mouth daily. 12/08/18   Money, Gerlene Burdockravis B, FNP  sertraline (ZOLOFT) 100 MG tablet Take 1 tablet (100 mg total) by mouth daily. For mood control 12/08/18   Money, Gerlene Burdockravis B, FNP  ticagrelor (BRILINTA) 90 MG TABS tablet Take 1 tablet (90 mg total) by mouth 2 (two) times daily. 12/07/18   Money, Gerlene Burdockravis B, FNP    Family History Family History  Problem Relation Age of Onset  . Hypertension Mother   . Diabetes Mother   . Hypertension Father   . Diabetes Father     Social History Social History   Tobacco Use  . Smoking status: Current Every Day Smoker    Packs/day: 1.00    Types: Cigarettes  . Smokeless tobacco: Never Used  Substance Use Topics  . Alcohol use: Yes    Alcohol/week: 12.0 standard drinks    Types: 12 Cans of beer per week  . Drug use: Yes    Types: Cocaine    Comment: last use 3-4 days ago.     Allergies   Patient has no known allergies.   Review of Systems Review of Systems  All other systems reviewed and are negative.    Physical Exam Updated Vital Signs BP 128/77 (BP Location: Right Arm)   Pulse 77   Temp (!) 97.5 F (36.4 C) (Oral)   Resp 20   SpO2 96%   Physical Exam Vitals signs and nursing note reviewed.  Constitutional:       General: He is not in acute distress.    Appearance: He is well-developed.  HENT:     Head: Normocephalic and atraumatic.     Mouth/Throat:     Pharynx: No oropharyngeal exudate.  Eyes:     General: No scleral icterus.       Right eye: No discharge.        Left eye: No discharge.     Conjunctiva/sclera: Conjunctivae normal.     Pupils: Pupils are equal, round, and reactive to light.  Neck:     Musculoskeletal: Normal range of motion and neck supple.     Thyroid: No thyromegaly.     Vascular: No JVD.  Cardiovascular:     Rate and Rhythm: Normal rate and regular rhythm.     Heart sounds: Normal heart sounds. No murmur. No friction rub. No gallop.  Pulmonary:     Effort: Pulmonary effort is normal. No respiratory distress.     Breath sounds: Normal breath sounds. No wheezing or rales.  Abdominal:     General: Bowel sounds are normal. There is no distension.     Palpations: Abdomen is soft. There is no mass.     Tenderness: There is no abdominal tenderness.  Musculoskeletal: Normal range of motion.        General: No tenderness.  Lymphadenopathy:     Cervical: No cervical adenopathy.  Skin:    General: Skin is warm and dry.     Findings: No erythema or rash.  Neurological:     Mental Status: He is alert.     Coordination: Coordination normal.  Psychiatric:        Behavior: Behavior normal.      ED Treatments / Results  Labs (all labs ordered are listed, but only abnormal results are displayed) Labs Reviewed  I-STAT CHEM 8, ED - Abnormal; Notable for the following components:      Result Value   Glucose, Bld 100 (*)    Calcium, Ion 1.14 (*)    All other components within normal limits  I-STAT TROPONIN, ED    EKG EKG Interpretation  Date/Time:  Friday December 08 2018 07:19:09 EST Ventricular Rate:  90 PR Interval:    QRS Duration: 91 QT Interval:  347 QTC Calculation: 425 R Axis:   90 Text Interpretation:  Sinus rhythm Borderline right axis deviation Low  voltage, precordial leads Baseline wander in lead(s) V1 V2 since last tracing no significant change c/w 12/03/2018 Confirmed by Eber Hong ([id number]) on 12/08/2018 7:23:32 AM   Radiology Dg Chest 2 View  Result Date: 12/08/2018 CLINICAL DATA:  57 year old with chest pain. EXAM: CHEST - 2 VIEW COMPARISON:  Chest radiograph 12/03/2018.  Chest CT 10/22/2018 FINDINGS: Both lungs are clear. Heart and mediastinum are within normal limits. Trachea is midline. No pleural effusions. Mild degenerative endplate disease in the thoracic spine. Evidence for cardiac artery stents. IMPRESSION: No active cardiopulmonary disease. Electronically Signed   By: Richarda Overlie M.D.   On: 12/08/2018 08:43    Procedures Procedures (including critical care time)  Medications Ordered in ED Medications  acetaminophen (TYLENOL) tablet 1,000 mg (has no administration in time range)     Initial Impression / Assessment and Plan / ED Course  I have reviewed the triage vital signs and the nursing notes.  Pertinent labs & imaging results that were available during my care of the patient were reviewed by me and considered in my medical decision making (see chart for details).  Clinical Course as of Dec 09 943  Fri Dec 08, 2018  0981 I have personally viewed the chest x-ray, two-view PA and lateral, there is no signs of infiltrate pneumothorax or abnormal mediastinum.  Normal soft tissues, bony structures and no subdiaphragmatic air.   [BM]    Clinical Course User Index [BM] Eber Hong, MD    EKG performed on December 08, 2018 at 7:19 AM shows normal sinus rhythm with normal axis, normal intervals, normal ST segments, normal T waves, impression is a fairly normal EKG, no signs of ischemia.  The patient has had multiple visits for similar, states he has been having pain all night, this is greater than 10 hours of ongoing pain.  He has had recent testing showing cocaine positive, he has known coronary disease thus his  complaint will not be discounted but fully evaluated as a possible cardiac  cause however given his reproducible nature with even small movements it seems less likely to be that.  Additionally the patient has underlying substance abuse problems.  He does not appear depressed or suicidal and does not comment on that on his initial intake forms or history and evaluation.  Troponin negative, EKG and chest x-ray unremarkable, metabolic panel also unremarkable, the patient was counseled to stop smoking, avoid cocaine use and follow-up with a cardiologist.  He was given the phone number for close follow-up.  Tylenol given prior to discharge for his chronic pain.  On observation the patient has a chest pain anytime he rolls to the side, sits up, uses his arms etc.  He has no signs of pericarditis on exam including no ST elevation, PR depression or a rub murmur or gallop.  Final Clinical Impressions(s) / ED Diagnoses   Final diagnoses:  Recurrent chest pain    ED Discharge Orders    None       Eber Hong, MD 12/08/18 [id number]

## 2018-12-08 NOTE — ED Provider Notes (Signed)
[facility]-EMERGENCY DEPT Provider Note   CSN: [id number] Arrival date & time: 12/08/18  2229     History   Chief Complaint Chief Complaint  Patient presents with  . Suicidal    HPI Warren Salas is a 57 y.o. male with history of hypertension, diabetes, COPD, major depression disorder, alcoholism, cocaine use who presents with suicidal ideation.  Patient reports he has been feeling this way for about a month.  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  He reports to be good control he might feel a lot better.  He denies any HI.  He admits to auditory and visual hallucinations.  He reports he is supposed to take mental health medications, however he does not.  He drinks from the time he wakes up from the time he goes to bed.  He occasionally uses cocaine.  Patient reportedly called EMS for chest pain, but denied it at the time of their arrival and stated he was suicidal instead.  He denies chest pain with me.  He reports a chronic smoker's cough and intermittent shortness of breath with this.  Nothing new.  HPI  Past Medical History:  Diagnosis Date  . Back pain   . COPD (chronic obstructive pulmonary disease) (HCC)   . Diabetes mellitus without complication (HCC)   . Hypertension   . STEMI (ST elevation myocardial infarction) (HCC) 10/2017    Patient Active Problem List   Diagnosis Date Noted  . Severe recurrent major depression w/psychotic features, mood-congruent (HCC) 12/05/2018  . Moderate cocaine use disorder (HCC) 11/16/2018  . MDD (major depressive disorder), recurrent severe, without psychosis (HCC) 10/27/2018  . Alcohol dependence with alcohol-induced mood disorder (HCC)   . CAD (coronary artery disease) 12/12/2017  . Old MI (myocardial infarction) 12/12/2017  . Tobacco abuse 12/12/2017  . Type 2 diabetes mellitus with complication, without  long-term current use of insulin (HCC) 12/12/2017  . Acute MI, inferior wall (HCC) 11/01/2017  . Acute inferior myocardial infarction [facility])     Past Surgical History:  Procedure Laterality Date  . APPENDECTOMY    . CORONARY STENT INTERVENTION N/A 12/29/2017   Procedure: CORONARY STENT INTERVENTION - LAD;  Surgeon: Corky Crafts, MD;  Location: [facility] INVASIVE CV LAB;  Service: Cardiovascular;  Laterality: N/A;  . CORONARY/GRAFT ACUTE MI REVASCULARIZATION N/A 11/01/2017   Procedure: Coronary/Graft Acute MI Revascularization;  Surgeon: Corky Crafts, MD;  Location: [facility] INVASIVE CV LAB;  Service: Cardiovascular;  Laterality: N/A;  . LEFT HEART CATH AND CORONARY ANGIOGRAPHY N/A 11/01/2017   Procedure: LEFT HEART CATH AND CORONARY ANGIOGRAPHY;  Surgeon: Corky Crafts, MD;  Location: [facility] INVASIVE CV LAB;  Service: Cardiovascular;  Laterality: N/A;        Home Medications    Prior to Admission medications   Medication Sig Start Date End Date Taking? Authorizing Provider  albuterol (PROVENTIL HFA;VENTOLIN HFA) 108 (90 Base) MCG/ACT inhaler Inhale 1-2 puffs into the lungs every 6 (six) hours as needed for wheezing. 11/22/18  Yes Micheal Likens, MD  aspirin EC 81 MG tablet Take 1 tablet (81 mg total) by mouth daily. 11/22/18  Yes Micheal Likens, MD  atorvastatin (LIPITOR) 80 MG tablet Take 1 tablet (80 mg total) by mouth daily at 6 PM. For high cholesterol 12/07/18  Yes Money, Gerlene Burdock, FNP  budesonide-formoterol (SYMBICORT) 160-4.5 MCG/ACT inhaler Inhale 2  puffs into the lungs 2 (two) times daily as needed (For shortness of breath.). 11/22/18  Yes Micheal Likens, MD  hydrOXYzine (ATARAX/VISTARIL) 25 MG tablet Take 1 tablet (25 mg total) by mouth every 6 (six) hours as needed for anxiety. 12/07/18  Yes Money, Gerlene Burdock, FNP  isosorbide mononitrate (IMDUR) 30 MG 24 hr tablet Take 1 tablet (30 mg total) by mouth daily. 12/08/18  Yes Money, Gerlene Burdock, FNP    lisinopril (PRINIVIL,ZESTRIL) 10 MG tablet Take 0.5 tablets (5 mg total) by mouth daily. For high blood pressure 12/07/18  Yes Money, Gerlene Burdock, FNP  metoprolol tartrate (LOPRESSOR) 50 MG tablet Take 1 tablet (50 mg total) by mouth 2 (two) times daily. For high blood pressure 12/07/18  Yes Money, Gerlene Burdock, FNP  nitroGLYCERIN (NITROSTAT) 0.4 MG SL tablet Place 1 tablet (0.4 mg total) under the tongue every 5 (five) minutes as needed. 11/22/18  Yes Micheal Likens, MD  sertraline (ZOLOFT) 100 MG tablet Take 1 tablet (100 mg total) by mouth daily. For mood control 12/08/18  Yes Money, Gerlene Burdock, FNP  ticagrelor (BRILINTA) 90 MG TABS tablet Take 1 tablet (90 mg total) by mouth 2 (two) times daily. 12/07/18  Yes Money, Gerlene Burdock, FNP  metFORMIN (GLUCOPHAGE) 500 MG tablet Take 1 tablet (500 mg total) by mouth 2 (two) times daily. For high blood sugar Patient not taking: Reported on 12/09/2018 12/07/18   Money, Gerlene Burdock, FNP  pantoprazole (PROTONIX) 40 MG tablet Take 1 tablet (40 mg total) by mouth daily. Patient not taking: Reported on 12/09/2018 12/08/18   Money, Gerlene Burdock, FNP    Family History Family History  Problem Relation Age of Onset  . Hypertension Mother   . Diabetes Mother   . Hypertension Father   . Diabetes Father     Social History Social History   Tobacco Use  . Smoking status: Current Every Day Smoker    Packs/day: 1.00    Types: Cigarettes  . Smokeless tobacco: Never Used  Substance Use Topics  . Alcohol use: Yes    Alcohol/week: 12.0 standard drinks    Types: 12 Cans of beer per week  . Drug use: Yes    Types: Cocaine    Comment: last use 3-4 days ago.     Allergies   Patient has no known allergies.   Review of Systems Review of Systems  Constitutional: Negative for chills and fever.  HENT: Negative for facial swelling and sore throat.   Respiratory: Negative for shortness of breath.   Cardiovascular: Negative for chest pain.  Gastrointestinal:  Negative for abdominal pain, nausea and vomiting.  Genitourinary: Negative for dysuria.  Musculoskeletal: Negative for back pain.  Skin: Negative for rash and wound.  Neurological: Negative for headaches.  Psychiatric/Behavioral: Positive for suicidal ideas. The patient is not nervous/anxious.      Physical Exam Updated Vital Signs BP 123/76 (BP Location: Left Arm)   Pulse 80   Temp 98.8 F (37.1 C) (Oral)   Resp 17   Ht 5\' 6"  (1.676 m)   Wt 70.3 kg   SpO2 95%   BMI 25.02 kg/m   Physical Exam Vitals signs and nursing note reviewed.  Constitutional:      General: He is not in acute distress.    Appearance: He is well-developed. He is not diaphoretic.  HENT:     Head: Normocephalic and atraumatic.     Mouth/Throat:     Pharynx: No oropharyngeal exudate.  Eyes:  General: No scleral icterus.       Right eye: No discharge.        Left eye: No discharge.     Conjunctiva/sclera: Conjunctivae normal.     Pupils: Pupils are equal, round, and reactive to light.  Neck:     Musculoskeletal: Normal range of motion and neck supple.     Thyroid: No thyromegaly.  Cardiovascular:     Rate and Rhythm: Normal rate and regular rhythm.     Heart sounds: Normal heart sounds. No murmur. No friction rub. No gallop.   Pulmonary:     Effort: Pulmonary effort is normal. No respiratory distress.     Breath sounds: Normal breath sounds. No stridor. No wheezing or rales.  Abdominal:     General: Bowel sounds are normal. There is no distension.     Palpations: Abdomen is soft.     Tenderness: There is no abdominal tenderness. There is no guarding or rebound.  Lymphadenopathy:     Cervical: No cervical adenopathy.  Skin:    General: Skin is warm and dry.     Coloration: Skin is not pale.     Findings: No rash.  Neurological:     Mental Status: He is alert.     Coordination: Coordination normal.  Psychiatric:        Mood and Affect: Affect is labile.        Speech: Speech is  tangential.        Behavior: Behavior is agitated.        Thought Content: Thought content includes suicidal ideation. Thought content does not include homicidal ideation. Thought content includes suicidal plan.        Judgment: Judgment is impulsive.     Comments: Intoxicated      ED Treatments / Results  Labs (all labs ordered are listed, but only abnormal results are displayed) Labs Reviewed  COMPREHENSIVE METABOLIC PANEL - Abnormal; Notable for the following components:      Result Value   CO2 21 (*)    All other components within normal limits  ETHANOL - Abnormal; Notable for the following components:   Alcohol, Ethyl (B) 268 (*)    All other components within normal limits  ACETAMINOPHEN LEVEL - Abnormal; Notable for the following components:   Acetaminophen (Tylenol), Serum <10 (*)    All other components within normal limits  CBC - Abnormal; Notable for the following components:   MCV 101.4 (*)    All other components within normal limits  SALICYLATE LEVEL  RAPID URINE DRUG SCREEN, HOSP PERFORMED    EKG None  Radiology Dg Chest 2 View  Result Date: 12/08/2018 CLINICAL DATA:  57 year old with chest pain. EXAM: CHEST - 2 VIEW COMPARISON:  Chest radiograph 12/03/2018.  Chest CT 10/22/2018 FINDINGS: Both lungs are clear. Heart and mediastinum are within normal limits. Trachea is midline. No pleural effusions. Mild degenerative endplate disease in the thoracic spine. Evidence for cardiac artery stents. IMPRESSION: No active cardiopulmonary disease. Electronically Signed   By: Richarda Overlie M.D.   On: 12/08/2018 08:43    Procedures Procedures (including critical care time)  Medications Ordered in ED Medications  LORazepam (ATIVAN) injection 0-4 mg (0 mg Intravenous Not Given 12/09/18 0637)    Or  LORazepam (ATIVAN) tablet 0-4 mg ( Oral See Alternative 12/09/18 0637)  LORazepam (ATIVAN) injection 0-4 mg (has no administration in time range)    Or  LORazepam (ATIVAN)  tablet 0-4 mg (has no administration in time range)  thiamine (VITAMIN B-1) tablet 100 mg (has no administration in time range)    Or  thiamine (B-1) injection 100 mg (has no administration in time range)     Initial Impression / Assessment and Plan / ED Course  I have reviewed the triage vital signs and the nursing notes.  Pertinent labs & imaging results that were available during my care of the patient were reviewed by me and considered in my medical decision making (see chart for details).     Patient presenting with suicidal ideation, as well as AVH.  He is intoxicated.  Chest x-ray is clear.  Patient denies chest pain at present states he just has a smoker's cough and intermittent shortness of breath at this.  Patient is medically cleared.  Disposition pending TTS evaluation.  Throughout patient's course, TTS has attempted evaluation twice, patient has been sleeping.  Final Clinical Impressions(s) / ED Diagnoses   Final diagnoses:  Suicidal ideation  Alcohol abuse    ED Discharge Orders    None       Emi HolesLaw, Orene Abbasi M, PA-C 12/09/18 [id number]    Derwood KaplanNanavati, Ankit, MD 12/09/18 [phone]

## 2018-12-09 DIAGNOSIS — F1010 Alcohol abuse, uncomplicated: Secondary | ICD-10-CM | POA: Diagnosis not present

## 2018-12-09 DIAGNOSIS — R45851 Suicidal ideations: Secondary | ICD-10-CM | POA: Diagnosis not present

## 2018-12-09 DIAGNOSIS — F1024 Alcohol dependence with alcohol-induced mood disorder: Secondary | ICD-10-CM | POA: Diagnosis not present

## 2018-12-09 DIAGNOSIS — R079 Chest pain, unspecified: Secondary | ICD-10-CM | POA: Diagnosis not present

## 2018-12-09 MED ORDER — LORAZEPAM 2 MG/ML IJ SOLN: 0.0000 mg | Freq: Two times a day (BID) | INTRAMUSCULAR | Status: DC

## 2018-12-09 MED ORDER — GABAPENTIN 300 MG PO CAPS: 300.0000 mg | ORAL_CAPSULE | Freq: Two times a day (BID) | ORAL | 1 refills | Status: DC

## 2018-12-09 MED ORDER — LORAZEPAM 1 MG PO TABS: 0.0000 mg | ORAL_TABLET | Freq: Four times a day (QID) | ORAL | Status: DC

## 2018-12-09 MED ORDER — GABAPENTIN 300 MG PO CAPS
300.0000 mg | ORAL_CAPSULE | Freq: Two times a day (BID) | ORAL | Status: DC
  Administered 2018-12-09: 300 mg via ORAL
  Filled 2018-12-09: qty 1

## 2018-12-09 MED ORDER — THIAMINE HCL 100 MG/ML IJ SOLN: 100.0000 mg | Freq: Every day | INTRAMUSCULAR | Status: DC

## 2018-12-09 MED ORDER — LORAZEPAM 2 MG/ML IJ SOLN: 0.0000 mg | Freq: Four times a day (QID) | INTRAMUSCULAR | Status: DC

## 2018-12-09 MED ORDER — GABAPENTIN 300 MG PO CAPS: 300.0000 mg | ORAL_CAPSULE | Freq: Two times a day (BID) | ORAL | 0 refills | Status: DC

## 2018-12-09 MED ORDER — LORAZEPAM 1 MG PO TABS: 0.0000 mg | ORAL_TABLET | Freq: Two times a day (BID) | ORAL | Status: DC

## 2018-12-09 MED ORDER — VITAMIN B-1 100 MG PO TABS
100.0000 mg | ORAL_TABLET | Freq: Every day | ORAL | Status: DC
  Administered 2018-12-09: 100 mg via ORAL
  Filled 2018-12-09: qty 1

## 2018-12-09 NOTE — BHH Counselor (Signed)
Clinician attempted to engage the pt assessment however the pt continued sleeping and would not engage. Discussed with Everardo PacificKenisha, RN. TTS to check back.   Redmond Pullingreylese D Apolonio Cutting, MS, [facility], [facility] Triage Specialist [phone]

## 2018-12-09 NOTE — Consult Note (Addendum)
[facility] Psych ED Discharge  12/09/2018 10:49 AM Warren Salas  MRN:  [id number] Principal Problem: Alcohol abuse with alcohol induced mood disorder Discharge Diagnoses: Alcohol abuse with alcohol induced mood disorder  Subjective: 57 yo male who presented to the ED under the influence of alcohol with passive suicidal ideations.  He reports he uses alcohol when he is stressed, decrease use with medications and social support.  No withdrawal symptoms or suicidal/homicidal ideations, hallucinations on assessment.  Released from [facility] on 12/07/18, peer support consult placed, stable for discharge.  Total Time spent with patient: 45 minutes  Past Psychiatric History: alcohol abuse  Past Medical History:  Past Medical History:  Diagnosis Date  . Back pain   . COPD (chronic obstructive pulmonary disease) (HCC)   . Diabetes mellitus without complication (HCC)   . Hypertension   . STEMI (ST elevation myocardial infarction) (HCC) 10/2017    Past Surgical History:  Procedure Laterality Date  . APPENDECTOMY    . CORONARY STENT INTERVENTION N/A 12/29/2017   Procedure: CORONARY STENT INTERVENTION - LAD;  Surgeon: Corky Crafts, MD;  Location: [facility] INVASIVE CV LAB;  Service: Cardiovascular;  Laterality: N/A;  . CORONARY/GRAFT ACUTE MI REVASCULARIZATION N/A 11/01/2017   Procedure: Coronary/Graft Acute MI Revascularization;  Surgeon: Corky Crafts, MD;  Location: [facility] INVASIVE CV LAB;  Service: Cardiovascular;  Laterality: N/A;  . LEFT HEART CATH AND CORONARY ANGIOGRAPHY N/A 11/01/2017   Procedure: LEFT HEART CATH AND CORONARY ANGIOGRAPHY;  Surgeon: Corky Crafts, MD;  Location: [facility] INVASIVE CV LAB;  Service: Cardiovascular;  Laterality: N/A;   Family History:  Family History  Problem Relation Age of Onset  . Hypertension Mother   . Diabetes Mother   . Hypertension Father   . Diabetes Father    Family Psychiatric  History: none Social History:  Social History   Substance and Sexual  Activity  Alcohol Use Yes  . Alcohol/week: 12.0 standard drinks  . Types: 12 Cans of beer per week     Social History   Substance and Sexual Activity  Drug Use Yes  . Types: Cocaine   Comment: last use 3-4 days ago.    Social History   Socioeconomic History  . Marital status: Divorced    Spouse name: Not on file  . Number of children: 3  . Years of education: Not on file  . Highest education level: Not on file  Occupational History  . Not on file  Social Needs  . Financial resource strain: Not on file  . Food insecurity:    Worry: Not on file    Inability: Not on file  . Transportation needs:    Medical: Not on file    Non-medical: Not on file  Tobacco Use  . Smoking status: Current Every Day Smoker    Packs/day: 1.00    Types: Cigarettes  . Smokeless tobacco: Never Used  Substance and Sexual Activity  . Alcohol use: Yes    Alcohol/week: 12.0 standard drinks    Types: 12 Cans of beer per week  . Drug use: Yes    Types: Cocaine    Comment: last use 3-4 days ago.  Marland Kitchen Sexual activity: Never  Lifestyle  . Physical activity:    Days per week: Not on file    Minutes per session: Not on file  . Stress: Not on file  Relationships  . Social connections:    Talks on phone: Not on file    Gets together: Not on file  Attends religious service: Not on file    Active member of club or organization: Not on file    Attends meetings of clubs or organizations: Not on file    Relationship status: Not on file  Other Topics Concern  . Not on file  Social History Narrative  . Not on file    Has this patient used any form of tobacco in the last 30 days? (Cigarettes, Smokeless Tobacco, Cigars, and/or Pipes) A prescription for an FDA-approved tobacco cessation medication was offered at discharge and the patient refused  Current Medications: Current Facility-Administered Medications  Medication Dose Route Frequency Provider Last Rate Last Dose  . gabapentin (NEURONTIN)  capsule 300 mg  300 mg Oral BID Jenavive Lamboy, MD   300 mg at 12/09/18 1005  . LORazepam (ATIVAN) injection 0-4 mg  0-4 mg Intravenous Q6H Law, Waylan Boga, PA-C       Or  . LORazepam (ATIVAN) tablet 0-4 mg  0-4 mg Oral Q6H Law, Alexandra M, PA-C      . [START ON 12/11/2018] LORazepam (ATIVAN) injection 0-4 mg  0-4 mg Intravenous Q12H Law, Alexandra M, PA-C       Or  . Melene Muller ON 12/11/2018] LORazepam (ATIVAN) tablet 0-4 mg  0-4 mg Oral Q12H Law, Alexandra M, PA-C      . thiamine (VITAMIN B-1) tablet 100 mg  100 mg Oral Daily Law, Alexandra M, PA-C   100 mg at 12/09/18 4098   Or  . thiamine (B-1) injection 100 mg  100 mg Intravenous Daily Law, Alexandra M, PA-C       Current Outpatient Medications  Medication Sig Dispense Refill  . albuterol (PROVENTIL HFA;VENTOLIN HFA) 108 (90 Base) MCG/ACT inhaler Inhale 1-2 puffs into the lungs every 6 (six) hours as needed for wheezing. 1 Inhaler 0  . aspirin EC 81 MG tablet Take 1 tablet (81 mg total) by mouth daily. 30 tablet 0  . atorvastatin (LIPITOR) 80 MG tablet Take 1 tablet (80 mg total) by mouth daily at 6 PM. For high cholesterol 30 tablet 0  . budesonide-formoterol (SYMBICORT) 160-4.5 MCG/ACT inhaler Inhale 2 puffs into the lungs 2 (two) times daily as needed (For shortness of breath.). 1 Inhaler 0  . hydrOXYzine (ATARAX/VISTARIL) 25 MG tablet Take 1 tablet (25 mg total) by mouth every 6 (six) hours as needed for anxiety. 30 tablet 0  . isosorbide mononitrate (IMDUR) 30 MG 24 hr tablet Take 1 tablet (30 mg total) by mouth daily. 30 tablet 0  . lisinopril (PRINIVIL,ZESTRIL) 10 MG tablet Take 0.5 tablets (5 mg total) by mouth daily. For high blood pressure 15 tablet 0  . metoprolol tartrate (LOPRESSOR) 50 MG tablet Take 1 tablet (50 mg total) by mouth 2 (two) times daily. For high blood pressure 60 tablet 0  . nitroGLYCERIN (NITROSTAT) 0.4 MG SL tablet Place 1 tablet (0.4 mg total) under the tongue every 5 (five) minutes as needed. 15 tablet 0  .  sertraline (ZOLOFT) 100 MG tablet Take 1 tablet (100 mg total) by mouth daily. For mood control 30 tablet 0  . ticagrelor (BRILINTA) 90 MG TABS tablet Take 1 tablet (90 mg total) by mouth 2 (two) times daily. 60 tablet 0  . gabapentin (NEURONTIN) 300 MG capsule Take 1 capsule (300 mg total) by mouth 2 (two) times daily. 90 capsule 0  . metFORMIN (GLUCOPHAGE) 500 MG tablet Take 1 tablet (500 mg total) by mouth 2 (two) times daily. For high blood sugar (Patient not taking: Reported on  12/09/2018) 60 tablet 0  . pantoprazole (PROTONIX) 40 MG tablet Take 1 tablet (40 mg total) by mouth daily. (Patient not taking: Reported on 12/09/2018) 30 tablet 0   PTA Medications: (Not in a hospital admission)   Musculoskeletal: Strength & Muscle Tone: within normal limits Gait & Station: normal Patient leans: N/A  Psychiatric Specialty Exam: Physical Exam  Nursing note and vitals reviewed. Constitutional: He is oriented to person, place, and time. He appears well-developed and well-nourished.  HENT:  Head: Normocephalic.  Neck: Normal range of motion.  Respiratory: Effort normal.  Musculoskeletal: Normal range of motion.  Neurological: He is alert and oriented to person, place, and time.  Psychiatric: His speech is normal and behavior is normal. Judgment and thought content normal. His mood appears anxious. His affect is blunt. Cognition and memory are normal. He exhibits a depressed mood.    Review of Systems  Psychiatric/Behavioral: Positive for depression and substance abuse. The patient is nervous/anxious.   All other systems reviewed and are negative.   Blood pressure 123/76, pulse 80, temperature 98.8 F (37.1 C), temperature source Oral, resp. rate 17, height 5\' 6"  (1.676 m), weight 70.3 kg, SpO2 95 %.Body mass index is 25.02 kg/m.  General Appearance: Casual  Eye Contact:  Good  Speech:  Normal Rate  Volume:  Normal  Mood:  Anxious, depressed mild  Affect:  Congruent  Thought Process:   Coherent and Descriptions of Associations: Intact  Orientation:  Full (Time, Place, and Person)  Thought Content:  WDL and Logical  Suicidal Thoughts:  No  Homicidal Thoughts:  No  Memory:  Immediate;   Good Recent;   Good Remote;   Good  Judgement:  Fair  Insight:  Fair  Psychomotor Activity:  Normal  Concentration:  Concentration: Good and Attention Span: Good  Recall:  Good  Fund of Knowledge:  Good  Language:  Good  Akathisia:  No  Handed:  Right  AIMS (if indicated):     Assets:  Leisure Time Physical Health Resilience  ADL's:  Intact  Cognition:  WNL  Sleep:        Demographic Factors:  Male and Caucasian  Loss Factors: NA  Historical Factors: NA  Risk Reduction Factors:   Sense of responsibility to family, Living with another person, especially a relative and Positive social support  Continued Clinical Symptoms:  Depression and anxiety, mild  Cognitive Features That Contribute To Risk:  None    Suicide Risk:  Minimal: No identifiable suicidal ideation.  Patients presenting with no risk factors but with morbid ruminations; may be classified as minimal risk based on the severity of the depressive symptoms    Plan Of Care/Follow-up recommendations:  Alcohol abuse with alcohol induced mood disorder:  -Started gabapentin 300 mg TID for alcohol abuse -Started Ativan alcohol detox protocol -Continued Zoloft 100 mg daily for depression   Activity:  as tolerated Diet:  heart healthy diet  Disposition: discharge home Nanine MeansLORD, JAMISON, NP 12/09/2018, 10:49 AM  Patient seen face-to-face for psychiatric evaluation, chart reviewed and case discussed with the physician extender and developed treatment plan. Reviewed the information documented and agree with the treatment plan. Thedore MinsMojeed Taji Barretto, MD

## 2018-12-09 NOTE — BHH Counselor (Signed)
Per Marcelino DusterMichelle, NT pt is still sleeping, assessment will be completed once pt is roused/alert. TTS to continue to monitor.   Warren Pullingreylese D Mattheo Swindle, MS, [facility], [facility] Triage Specialist [phone]

## 2018-12-10 ENCOUNTER — Encounter (HOSPITAL_COMMUNITY): Payer: Self-pay

## 2018-12-10 ENCOUNTER — Other Ambulatory Visit: Payer: Self-pay

## 2018-12-10 ENCOUNTER — Emergency Department (HOSPITAL_COMMUNITY)
Admission: EM | Admit: 2018-12-10 | Discharge: 2018-12-10 | Disposition: A | Discharge status: Home / Self Care | Payer: Medicare Other | Attending: Emergency Medicine | Admitting: Emergency Medicine

## 2018-12-10 ENCOUNTER — Emergency Department (HOSPITAL_COMMUNITY): Payer: Medicare Other

## 2018-12-10 DIAGNOSIS — W19XXXA Unspecified fall, initial encounter: Secondary | ICD-10-CM

## 2018-12-10 DIAGNOSIS — Y929 Unspecified place or not applicable: Secondary | ICD-10-CM | POA: Insufficient documentation

## 2018-12-10 DIAGNOSIS — W1830XA Fall on same level, unspecified, initial encounter: Secondary | ICD-10-CM | POA: Insufficient documentation

## 2018-12-10 DIAGNOSIS — Z79899 Other long term (current) drug therapy: Secondary | ICD-10-CM | POA: Insufficient documentation

## 2018-12-10 DIAGNOSIS — F10920 Alcohol use, unspecified with intoxication, uncomplicated: Secondary | ICD-10-CM | POA: Diagnosis not present

## 2018-12-10 DIAGNOSIS — Y939 Activity, unspecified: Secondary | ICD-10-CM | POA: Diagnosis not present

## 2018-12-10 DIAGNOSIS — Y999 Unspecified external cause status: Secondary | ICD-10-CM | POA: Diagnosis not present

## 2018-12-10 DIAGNOSIS — E119 Type 2 diabetes mellitus without complications: Secondary | ICD-10-CM | POA: Insufficient documentation

## 2018-12-10 DIAGNOSIS — I10 Essential (primary) hypertension: Secondary | ICD-10-CM | POA: Diagnosis not present

## 2018-12-10 DIAGNOSIS — I2510 Atherosclerotic heart disease of native coronary artery without angina pectoris: Secondary | ICD-10-CM | POA: Insufficient documentation

## 2018-12-10 DIAGNOSIS — Z7982 Long term (current) use of aspirin: Secondary | ICD-10-CM | POA: Insufficient documentation

## 2018-12-10 DIAGNOSIS — Z7984 Long term (current) use of oral hypoglycemic drugs: Secondary | ICD-10-CM | POA: Diagnosis not present

## 2018-12-10 DIAGNOSIS — Z955 Presence of coronary angioplasty implant and graft: Secondary | ICD-10-CM | POA: Diagnosis not present

## 2018-12-10 DIAGNOSIS — J449 Chronic obstructive pulmonary disease, unspecified: Secondary | ICD-10-CM | POA: Diagnosis not present

## 2018-12-10 DIAGNOSIS — F17210 Nicotine dependence, cigarettes, uncomplicated: Secondary | ICD-10-CM | POA: Insufficient documentation

## 2018-12-10 DIAGNOSIS — S199XXA Unspecified injury of neck, initial encounter: Secondary | ICD-10-CM | POA: Diagnosis not present

## 2018-12-10 DIAGNOSIS — S0083XA Contusion of other part of head, initial encounter: Secondary | ICD-10-CM | POA: Diagnosis not present

## 2018-12-10 DIAGNOSIS — S0990XA Unspecified injury of head, initial encounter: Secondary | ICD-10-CM | POA: Diagnosis not present

## 2018-12-10 MED ORDER — IBUPROFEN 200 MG PO TABS
600.0000 mg | ORAL_TABLET | Freq: Once | ORAL | Status: AC
  Administered 2018-12-10: 600 mg via ORAL
  Filled 2018-12-10: qty 3

## 2018-12-10 NOTE — ED Notes (Signed)
Sprite given per request 

## 2018-12-10 NOTE — ED Notes (Signed)
Bed: [facility] Expected date:  Expected time:  Means of arrival:  Comments: 

## 2018-12-10 NOTE — ED Notes (Signed)
Abrasion on left forehead cleaned.

## 2018-12-10 NOTE — ED Notes (Signed)
Patient ambulated to the restroom independently with no difficulty  ?

## 2018-12-10 NOTE — ED Triage Notes (Signed)
Pt presents with an abrasion to the R side of his forehead and a bruised L eye. EMS states that he fell forward from a sitting position. He is now complaining of a headache. Pt is intoxicated.Pt states that he did not want to come here.

## 2018-12-10 NOTE — ED Provider Notes (Signed)
[facility]-EMERGENCY DEPT Provider Note   CSN: [id number] Arrival date & time: 12/10/18  1856     History   Chief Complaint Chief Complaint  Patient presents with  . Alcohol Intoxication  . Head Injury    HPI Warren Salas is a 57 y.o. male.  The history is provided by the patient, the EMS personnel and medical records. No language interpreter was used.  Alcohol Intoxication   Head Injury     Warren Salas is a 57 y.o. male who presents to the Emergency Department complaining of head injury.  Level V caveat due to intoxication.  Pt presents by EMS for evaluation after falling forward from a sitting position to the ground.  He hit his head and reported headache to EMS.  Unclear if LOC.  He states he does not want to be here but is agreeable to evaluation.  He states he does not know why he fell but he has been drinking alcohol today.  Denies SI, HI, additional complaints.  He denies recent illnesses.  He is supposed to take medications  But does not take any.  He is currently homeless.   Past Medical History:  Diagnosis Date  . Back pain   . COPD (chronic obstructive pulmonary disease) (HCC)   . Diabetes mellitus without complication (HCC)   . Hypertension   . STEMI (ST elevation myocardial infarction) (HCC) 10/2017    Patient Active Problem List   Diagnosis Date Noted  . Severe recurrent major depression w/psychotic features, mood-congruent (HCC) 12/05/2018  . Moderate cocaine use disorder (HCC) 11/16/2018  . MDD (major depressive disorder), recurrent severe, without psychosis (HCC) 10/27/2018  . Alcohol dependence with alcohol-induced mood disorder (HCC)   . CAD (coronary artery disease) 12/12/2017  . Old MI (myocardial infarction) 12/12/2017  . Tobacco abuse 12/12/2017  . Type 2 diabetes mellitus with complication, without long-term current use of insulin (HCC) 12/12/2017  . Acute MI, inferior wall (HCC) 11/01/2017  . Acute inferior myocardial  infarction [facility])     Past Surgical History:  Procedure Laterality Date  . APPENDECTOMY    . CORONARY STENT INTERVENTION N/A 12/29/2017   Procedure: CORONARY STENT INTERVENTION - LAD;  Salas: Corky Crafts, MD;  [facility] IN[facility] LAB;  Service: Cardiovascular;  Laterality: N/A;  . CORONARY/GRAFT ACUTE MI REVASCULARIZATION N/A 11/01/2017   Procedure: Coronary/Graft Acute MI Revascularization;  Salas: Corky Crafts, MD;  Location: [facility] INVASIVE CV LAB;  Service: Cardiovascular;  Laterality: N/A;  . LEFT HEART CATH AND CORONARY ANGIOGRAPHY N/A 11/01/2017   Procedure: LEFT HEART CATH AND CORONARY ANGIOGRAPHY;  Salas: Corky Crafts, MD;  Location: [facility] INVASIVE CV LAB;  Service: Cardiovascular;  Laterality: N/A;        Home Medications    Prior to Admission medications   Medication Sig Start Date End Date Taking? Authorizing Provider  albuterol (PROVENTIL HFA;VENTOLIN HFA) 108 (90 Base) MCG/ACT inhaler Inhale 1-2 puffs into the lungs every 6 (six) hours as needed for wheezing. 11/22/18  Yes Micheal Likens, MD  aspirin EC 81 MG tablet Take 1 tablet (81 mg total) by mouth daily. 11/22/18  Yes Micheal Likens, MD  atorvastatin (LIPITOR) 80 MG tablet Take 1 tablet (80 mg total) by mouth daily at 6 PM. For high cholesterol 12/07/18  Yes Money, Gerlene Burdock, FNP  budesonide-formoterol (SYMBICORT) 160-4.5 MCG/ACT inhaler Inhale 2 puffs into the lungs 2 (two) times daily as needed (For shortness of breath.). 11/22/18  Yes Micheal Likens,  MD  hydrOXYzine (ATARAX/VISTARIL) 25 MG tablet Take 1 tablet (25 mg total) by mouth every 6 (six) hours as needed for anxiety. 12/07/18  Yes Money, Gerlene Burdock, FNP  isosorbide mononitrate (IMDUR) 30 MG 24 hr tablet Take 1 tablet (30 mg total) by mouth daily. 12/08/18  Yes Money, Gerlene Burdock, FNP  lisinopril (PRINIVIL,ZESTRIL) 10 MG tablet Take 0.5 tablets (5 mg total) by mouth daily. For high blood pressure 12/07/18  Yes  Money, Gerlene Burdock, FNP  metoprolol tartrate (LOPRESSOR) 50 MG tablet Take 1 tablet (50 mg total) by mouth 2 (two) times daily. For high blood pressure 12/07/18  Yes Money, Gerlene Burdock, FNP  nitroGLYCERIN (NITROSTAT) 0.4 MG SL tablet Place 1 tablet (0.4 mg total) under the tongue every 5 (five) minutes as needed. 11/22/18  Yes Micheal Likens, MD  sertraline (ZOLOFT) 100 MG tablet Take 1 tablet (100 mg total) by mouth daily. For mood control 12/08/18  Yes Money, Gerlene Burdock, FNP  ticagrelor (BRILINTA) 90 MG TABS tablet Take 1 tablet (90 mg total) by mouth 2 (two) times daily. 12/07/18  Yes Money, Gerlene Burdock, FNP  gabapentin (NEURONTIN) 300 MG capsule Take 1 capsule (300 mg total) by mouth 2 (two) times daily. 12/09/18   Charm Rings, NP  metFORMIN (GLUCOPHAGE) 500 MG tablet Take 1 tablet (500 mg total) by mouth 2 (two) times daily. For high blood sugar Patient not taking: Reported on 12/09/2018 12/07/18   Money, Gerlene Burdock, FNP  pantoprazole (PROTONIX) 40 MG tablet Take 1 tablet (40 mg total) by mouth daily. Patient not taking: Reported on 12/09/2018 12/08/18   Money, Gerlene Burdock, FNP    Family History Family History  Problem Relation Age of Onset  . Hypertension Mother   . Diabetes Mother   . Hypertension Father   . Diabetes Father     Social History Social History   Tobacco Use  . Smoking status: Current Every Day Smoker    Packs/day: 1.00    Types: Cigarettes  . Smokeless tobacco: Never Used  Substance Use Topics  . Alcohol use: Yes    Alcohol/week: 12.0 standard drinks    Types: 12 Cans of beer per week  . Drug use: Yes    Types: Cocaine    Comment: last use 3-4 days ago.     Allergies   Patient has no known allergies.   Review of Systems Review of Systems  All other systems reviewed and are negative.    Physical Exam Updated Vital Signs BP 134/63 (BP Location: Left Arm)   Pulse 100   Temp 98 F (36.7 C) (Oral)   Resp 17   SpO2 93%   Physical Exam Vitals  signs and nursing note reviewed.  Constitutional:      Appearance: He is well-developed.  HENT:     Head: Normocephalic.     Comments: Abrasion to right forehead.  Ecchymosis and swelling to left temple.   Eyes:     Extraocular Movements: Extraocular movements intact.     Pupils: Pupils are equal, round, and reactive to light.  Neck:     Musculoskeletal: Neck supple.  Cardiovascular:     Rate and Rhythm: Normal rate and regular rhythm.     Heart sounds: No murmur.  Pulmonary:     Effort: Pulmonary effort is normal. No respiratory distress.     Breath sounds: Normal breath sounds.  Abdominal:     Palpations: Abdomen is soft.     Tenderness: There is no abdominal tenderness.  There is no guarding or rebound.  Musculoskeletal:        General: No tenderness.  Skin:    General: Skin is warm and dry.  Neurological:     Mental Status: He is alert and oriented to person, place, and time.  Psychiatric:        Behavior: Behavior normal.      ED Treatments / Results  Labs (all labs ordered are listed, but only abnormal results are displayed) Labs Reviewed - No data to display  EKG None  Radiology No results found.  Procedures Procedures (including critical care time)  Medications Ordered in ED Medications  ibuprofen (ADVIL,MOTRIN) tablet 600 mg (600 mg Oral Given 12/10/18 2150)     Initial Impression / Assessment and Plan / ED Course  I have reviewed the triage vital signs and the nursing notes.  Pertinent labs & imaging results that were available during my care of the patient were reviewed by me and considered in my medical decision making (see chart for details).     Patient with history of coronary artery disease, alcohol abuse here for evaluation after drinking alcohol and falling, striking his face. He does have contusion an abrasion to his face and is mildly intoxicated.  He is able to ambulate without difficulty.  Imaging is negative for acute intracranial  abnormality.  Plan to d/c with outpatient follow up and return precautions.    Final Clinical Impressions(s) / ED Diagnoses   Final diagnoses:  Contusion of face, initial encounter  Fall, initial encounter  Alcoholic intoxication without complication [facility](HCC)    ED Discharge Orders    None       Tilden Fossaees, Camil Hausmann, MD 12/13/18 [phone]

## 2018-12-13 ENCOUNTER — Encounter (HOSPITAL_COMMUNITY): Payer: Self-pay | Admitting: Emergency Medicine

## 2018-12-13 ENCOUNTER — Other Ambulatory Visit: Payer: Self-pay

## 2018-12-13 ENCOUNTER — Emergency Department (HOSPITAL_COMMUNITY)
Admission: EM | Admit: 2018-12-13 | Discharge: 2018-12-13 | Disposition: A | Discharge status: Home / Self Care | Payer: Medicare Other | Attending: Emergency Medicine | Admitting: Emergency Medicine

## 2018-12-13 DIAGNOSIS — F17210 Nicotine dependence, cigarettes, uncomplicated: Secondary | ICD-10-CM | POA: Diagnosis not present

## 2018-12-13 DIAGNOSIS — I10 Essential (primary) hypertension: Secondary | ICD-10-CM | POA: Diagnosis not present

## 2018-12-13 DIAGNOSIS — Z7982 Long term (current) use of aspirin: Secondary | ICD-10-CM | POA: Insufficient documentation

## 2018-12-13 DIAGNOSIS — Z7984 Long term (current) use of oral hypoglycemic drugs: Secondary | ICD-10-CM | POA: Insufficient documentation

## 2018-12-13 DIAGNOSIS — I252 Old myocardial infarction: Secondary | ICD-10-CM | POA: Insufficient documentation

## 2018-12-13 DIAGNOSIS — J449 Chronic obstructive pulmonary disease, unspecified: Secondary | ICD-10-CM | POA: Insufficient documentation

## 2018-12-13 DIAGNOSIS — F10920 Alcohol use, unspecified with intoxication, uncomplicated: Secondary | ICD-10-CM | POA: Insufficient documentation

## 2018-12-13 DIAGNOSIS — M79673 Pain in unspecified foot: Secondary | ICD-10-CM | POA: Diagnosis present

## 2018-12-13 DIAGNOSIS — I2510 Atherosclerotic heart disease of native coronary artery without angina pectoris: Secondary | ICD-10-CM | POA: Diagnosis not present

## 2018-12-13 DIAGNOSIS — Z79899 Other long term (current) drug therapy: Secondary | ICD-10-CM | POA: Diagnosis not present

## 2018-12-13 DIAGNOSIS — E119 Type 2 diabetes mellitus without complications: Secondary | ICD-10-CM | POA: Diagnosis not present

## 2018-12-13 MED ORDER — ACETAMINOPHEN 500 MG PO TABS: 1000.0000 mg | ORAL_TABLET | Freq: Once | ORAL | Status: DC
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
## 2018-12-13 NOTE — Care Management Note (Signed)
Case Management Note  CM consulted for rehab and a buss pass.  CM will defer to CSW for their expertise; consult placed.  No further CM needs noted at this time.  Warren Salas, Lynnae SandhoffAngela N, RN 12/13/2018, 11:33 AM

## 2018-12-13 NOTE — Patient Outreach (Signed)
CPSS met with the patient to provide substance use recovery support and help with recovery resources. Patient plans on following up with the emergency shelter at the [facility] for case management. Patient passionate about his substance use recovery process. CPSS provided/explained information reagarding substance use recovery resources in the [place] area. Some of these recovery resources included outpatient/residential substance use treatment center list, information for the [employer] locations, and CPSS contact information. CPSS strongly encouraged the patient to continue to stay in contact with CPSS for substance use recovery support and help with recovery resources after discharge from the [facility]
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
## 2018-12-13 NOTE — ED Notes (Signed)
[facility] Expected date:  Expected time:  Means of arrival:  Comments: 57 yr old male, cold ETOH

## 2018-12-18 ENCOUNTER — Emergency Department (HOSPITAL_COMMUNITY)
Admission: EM | Admit: 2018-12-18 | Discharge: 2018-12-18 | Disposition: A | Discharge status: Home / Self Care | Payer: Medicare Other | Attending: Emergency Medicine | Admitting: Emergency Medicine

## 2018-12-18 ENCOUNTER — Other Ambulatory Visit: Payer: Self-pay

## 2018-12-18 ENCOUNTER — Encounter (HOSPITAL_COMMUNITY): Payer: Self-pay

## 2018-12-18 ENCOUNTER — Emergency Department (HOSPITAL_COMMUNITY): Payer: Medicare Other

## 2018-12-18 DIAGNOSIS — Z955 Presence of coronary angioplasty implant and graft: Secondary | ICD-10-CM | POA: Insufficient documentation

## 2018-12-18 DIAGNOSIS — R059 Cough, unspecified: Secondary | ICD-10-CM

## 2018-12-18 DIAGNOSIS — F17210 Nicotine dependence, cigarettes, uncomplicated: Secondary | ICD-10-CM | POA: Diagnosis not present

## 2018-12-18 DIAGNOSIS — Z79899 Other long term (current) drug therapy: Secondary | ICD-10-CM | POA: Diagnosis not present

## 2018-12-18 DIAGNOSIS — R05 Cough: Secondary | ICD-10-CM | POA: Insufficient documentation

## 2018-12-18 DIAGNOSIS — E119 Type 2 diabetes mellitus without complications: Secondary | ICD-10-CM | POA: Diagnosis not present

## 2018-12-18 DIAGNOSIS — Z7982 Long term (current) use of aspirin: Secondary | ICD-10-CM | POA: Insufficient documentation

## 2018-12-18 DIAGNOSIS — J029 Acute pharyngitis, unspecified: Secondary | ICD-10-CM

## 2018-12-18 DIAGNOSIS — J449 Chronic obstructive pulmonary disease, unspecified: Secondary | ICD-10-CM | POA: Insufficient documentation

## 2018-12-18 DIAGNOSIS — Z7984 Long term (current) use of oral hypoglycemic drugs: Secondary | ICD-10-CM | POA: Insufficient documentation

## 2018-12-18 DIAGNOSIS — J189 Pneumonia, unspecified organism: Secondary | ICD-10-CM | POA: Diagnosis not present

## 2018-12-18 DIAGNOSIS — M542 Cervicalgia: Secondary | ICD-10-CM | POA: Diagnosis not present

## 2018-12-18 DIAGNOSIS — I10 Essential (primary) hypertension: Secondary | ICD-10-CM | POA: Diagnosis not present

## 2018-12-18 LAB — GROUP A STREP BY PCR: Group A Strep by PCR: NOT DETECTED

## 2018-12-18 MED ORDER — IPRATROPIUM-ALBUTEROL 0.5-2.5 (3) MG/3ML IN SOLN
3.0000 mL | RESPIRATORY_TRACT | Status: DC
  Administered 2018-12-18: 3 mL via RESPIRATORY_TRACT
  Filled 2018-12-18: qty 3

## 2018-12-18 NOTE — ED Notes (Signed)
Pt states, "Goddammit, [facility]."

## 2018-12-18 NOTE — ED Triage Notes (Signed)
Per ems: pt coming from [employer] c/o sore throat and nonproductive cough secondary to "monia" that has been going on for 4 days. Lungs clear.

## 2018-12-18 NOTE — ED Notes (Signed)
Pt will be discharged after breathing treatment

## 2018-12-18 NOTE — ED Notes (Signed)
Pt upset because ED does not have any bus passes stating "what kind of hospital doesn't have passes?????" Pt ambulatory and discharged to lobby.

## 2018-12-18 NOTE — ED Provider Notes (Signed)
[facility]-EMERGENCY DEPT Provider Note: Lowella DellJ. Lane Regina Ganci, MD, FACEP  CSN: [id number] MRN: [id number] ARRIVAL: 12/18/18 at 0113 ROOM: WA14/WA14   CHIEF COMPLAINT  Sore Throat   HISTORY OF PRESENT ILLNESS  12/18/18 1:26 AM Warren Salas is a 57 y.o. male with a history of COPD.  He is here with about a week history of sore throat, dysphonia, subjective fever, cough and shortness of breath.  He has an inhaler but has not been using it because he states it burns his throat.  He denies nausea or vomiting.  He denies chest pain.   Past Medical History:  Diagnosis Date  . Back pain   . COPD (chronic obstructive pulmonary disease) (HCC)   . Diabetes mellitus without complication (HCC)   . Hypertension   . STEMI (ST elevation myocardial infarction) (HCC) 10/2017    Past Surgical History:  Procedure Laterality Date  . APPENDECTOMY    . CORONARY STENT INTERVENTION N/A 12/29/2017   Procedure: CORONARY STENT INTERVENTION - LAD;  Surgeon: Corky CraftsVaranasi, Jayadeep S, MD;  Location: [facility] INVASIVE CV LAB;  Service: Cardiovascular;  Laterality: N/A;  . CORONARY/GRAFT ACUTE MI REVASCULARIZATION N/A 11/01/2017   Procedure: Coronary/Graft Acute MI Revascularization;  Surgeon: Corky CraftsVaranasi, Jayadeep S, MD;  [facility];  Service: Cardiovascular;  Laterality: N/A;  . LEFT HEART CATH AND CORONARY ANGIOGRAPHY N/A 11/01/2017   Procedure: LEFT HEART CATH AND CORONARY ANGIOGRAPHY;  Surgeon: Corky CraftsVaranasi, Jayadeep S, MD;  [facility];  Service: Cardiovascular;  Laterality: N/A;    Family History  Problem Relation Age of Onset  . Hypertension Mother   . Diabetes Mother   . Hypertension Father   . Diabetes Father     Social History   Tobacco Use  . Smoking status: Current Every Day Smoker    Packs/day: 1.00    Types: Cigarettes  . Smokeless tobacco: Never Used  Substance Use Topics  . Alcohol use: Yes    Alcohol/week: 12.0 standard drinks    Types: 12 Cans of beer per week  . Drug  use: Yes    Types: Cocaine    Comment: last use 3-4 days ago.    Prior to Admission medications   Medication Sig Start Date End Date Taking? Authorizing Provider  albuterol (PROVENTIL HFA;VENTOLIN HFA) 108 (90 Base) MCG/ACT inhaler Inhale 1-2 puffs into the lungs every 6 (six) hours as needed for wheezing. 11/22/18   Micheal Likensainville, Christopher T, MD  aspirin EC 81 MG tablet Take 1 tablet (81 mg total) by mouth daily. 11/22/18   Micheal Likensainville, Christopher T, MD  atorvastatin (LIPITOR) 80 MG tablet Take 1 tablet (80 mg total) by mouth daily at 6 PM. For high cholesterol 12/07/18   Money, Gerlene Burdockravis B, FNP  budesonide-formoterol (SYMBICORT) 160-4.5 MCG/ACT inhaler Inhale 2 puffs into the lungs 2 (two) times daily as needed (For shortness of breath.). 11/22/18   Micheal Likensainville, Christopher T, MD  gabapentin (NEURONTIN) 300 MG capsule Take 1 capsule (300 mg total) by mouth 2 (two) times daily. 12/09/18   Charm RingsLord, Jamison Y, NP  hydrOXYzine (ATARAX/VISTARIL) 25 MG tablet Take 1 tablet (25 mg total) by mouth every 6 (six) hours as needed for anxiety. 12/07/18   Money, Gerlene Burdockravis B, FNP  isosorbide mononitrate (IMDUR) 30 MG 24 hr tablet Take 1 tablet (30 mg total) by mouth daily. 12/08/18   Money, Gerlene Burdockravis B, FNP  lisinopril (PRINIVIL,ZESTRIL) 10 MG tablet Take 0.5 tablets (5 mg total) by mouth daily. For high blood pressure 12/07/18   Money,  Gerlene Burdock, FNP  metFORMIN (GLUCOPHAGE) 500 MG tablet Take 1 tablet (500 mg total) by mouth 2 (two) times daily. For high blood sugar 12/07/18   Money, Gerlene Burdock, FNP  metoprolol tartrate (LOPRESSOR) 50 MG tablet Take 1 tablet (50 mg total) by mouth 2 (two) times daily. For high blood pressure 12/07/18   Money, Gerlene Burdock, FNP  nitroGLYCERIN (NITROSTAT) 0.4 MG SL tablet Place 1 tablet (0.4 mg total) under the tongue every 5 (five) minutes as needed. 11/22/18   Micheal Likens, MD  pantoprazole (PROTONIX) 40 MG tablet Take 1 tablet (40 mg total) by mouth daily. 12/08/18   Money, Gerlene Burdock,  FNP  sertraline (ZOLOFT) 100 MG tablet Take 1 tablet (100 mg total) by mouth daily. For mood control 12/08/18   Money, Gerlene Burdock, FNP  ticagrelor (BRILINTA) 90 MG TABS tablet Take 1 tablet (90 mg total) by mouth 2 (two) times daily. 12/07/18   Money, Gerlene Burdock, FNP    Allergies Patient has no known allergies.   REVIEW OF SYSTEMS  Negative except as noted here or in the History of Present Illness.   PHYSICAL EXAMINATION  Initial Vital Signs Blood pressure 131/69, pulse 88, resp. rate 20, height 5\' 6"  (1.676 m), weight 70 kg, SpO2 96 %.  Examination General: Well-developed, well-nourished male in no acute distress; appears older than age of record HENT: normocephalic; atraumatic; mild pharyngeal erythema without exudate; dysphonia Eyes: pupils equal, round and reactive to light; extraocular muscles intact Neck: supple Heart: regular rate and rhythm Lungs: Decreased air movement right base; frequent cough Abdomen: soft; nondistended; nontender; bowel sounds present Extremities: No deformity; full range of motion; pulses normal Neurologic: Awake, alert; motor function intact in all extremities and symmetric; no facial droop Skin: Warm and dry Psychiatric: Affect   RESULTS  Summary of this visit's results, reviewed by myself:   EKG Interpretation  Date/Time:    Ventricular Rate:    PR Interval:    QRS Duration:   QT Interval:    QTC Calculation:   R Axis:     Text Interpretation:        Laboratory Studies: Results for orders placed or performed during the hospital encounter of 12/18/18 (from the past 24 hour(s))  Group A Strep by PCR     Status: None   Collection Time: 12/18/18  1:19 AM  Result Value Ref Range   Group A Strep by PCR NOT DETECTED NOT DETECTED   Imaging Studies: Dg Chest 2 View  Result Date: 12/18/2018 CLINICAL DATA:  Sore throat, nonproductive cough for 4 days. History of COPD. EXAM: CHEST - 2 VIEW COMPARISON:  Chest radiograph December 08, 2018  FINDINGS: Cardiomediastinal silhouette is normal. No pleural effusions or focal consolidations. Trachea projects midline and there is no pneumothorax. Soft tissue planes and included osseous structures are non-suspicious. Mild thoracic spondylosis. Patient is edentulous. IMPRESSION: Negative. Electronically Signed   By: Awilda Metro M.D.   On: 12/18/2018 01:42    ED COURSE and MDM  Nursing notes and initial vitals signs, including pulse oximetry, reviewed.  Vitals:   12/18/18 0116 12/18/18 0118  BP: 131/69   Pulse: 88   Resp: 20   SpO2: 96%   Weight:  70 kg  Height:  5\' 6"  (1.676 m)   2:31 AM No evidence of significant acute illness.  Patient given DuoNeb treatment prior to discharge.  He was encouraged to use his albuterol inhaler.   PROCEDURES    ED DIAGNOSES  ICD-10-CM   1. Sore throat J02.9   2. Cough R05        Shelly Shoultz, Jonny RuizJohn, MD 12/18/18 [phone]

## 2018-12-18 NOTE — ED Notes (Signed)
Pt verbalized discharge instructions and follow up care.   

## 2018-12-28 ENCOUNTER — Encounter (HOSPITAL_COMMUNITY): Payer: Self-pay

## 2018-12-28 ENCOUNTER — Emergency Department (HOSPITAL_COMMUNITY)
Admission: EM | Admit: 2018-12-28 | Discharge: 2018-12-28 | Disposition: A | Discharge status: Home / Self Care | Payer: Medicare Other | Attending: Emergency Medicine | Admitting: Emergency Medicine

## 2018-12-28 ENCOUNTER — Emergency Department (HOSPITAL_COMMUNITY): Payer: Medicare Other

## 2018-12-28 ENCOUNTER — Other Ambulatory Visit: Payer: Self-pay

## 2018-12-28 DIAGNOSIS — J449 Chronic obstructive pulmonary disease, unspecified: Secondary | ICD-10-CM | POA: Diagnosis not present

## 2018-12-28 DIAGNOSIS — Z7984 Long term (current) use of oral hypoglycemic drugs: Secondary | ICD-10-CM | POA: Insufficient documentation

## 2018-12-28 DIAGNOSIS — Z79899 Other long term (current) drug therapy: Secondary | ICD-10-CM | POA: Insufficient documentation

## 2018-12-28 DIAGNOSIS — I2510 Atherosclerotic heart disease of native coronary artery without angina pectoris: Secondary | ICD-10-CM | POA: Insufficient documentation

## 2018-12-28 DIAGNOSIS — F10220 Alcohol dependence with intoxication, uncomplicated: Secondary | ICD-10-CM | POA: Diagnosis not present

## 2018-12-28 DIAGNOSIS — I10 Essential (primary) hypertension: Secondary | ICD-10-CM | POA: Diagnosis not present

## 2018-12-28 DIAGNOSIS — F17210 Nicotine dependence, cigarettes, uncomplicated: Secondary | ICD-10-CM | POA: Diagnosis not present

## 2018-12-28 DIAGNOSIS — E119 Type 2 diabetes mellitus without complications: Secondary | ICD-10-CM | POA: Insufficient documentation

## 2018-12-28 DIAGNOSIS — F10920 Alcohol use, unspecified with intoxication, uncomplicated: Secondary | ICD-10-CM

## 2018-12-28 DIAGNOSIS — Z7982 Long term (current) use of aspirin: Secondary | ICD-10-CM | POA: Insufficient documentation

## 2018-12-28 DIAGNOSIS — R079 Chest pain, unspecified: Secondary | ICD-10-CM | POA: Diagnosis not present

## 2018-12-28 DIAGNOSIS — R0789 Other chest pain: Secondary | ICD-10-CM | POA: Insufficient documentation

## 2018-12-28 DIAGNOSIS — I252 Old myocardial infarction: Secondary | ICD-10-CM | POA: Diagnosis not present

## 2018-12-28 LAB — BASIC METABOLIC PANEL
Anion gap: 15 (ref 5–15)
BUN: 8 mg/dL (ref 6–20)
CO2: 22 mmol/L (ref 22–32)
Calcium: 8.5 mg/dL — ABNORMAL LOW (ref 8.9–10.3)
Chloride: 104 mmol/L (ref 98–111)
Creatinine, Ser: 0.59 mg/dL — ABNORMAL LOW (ref 0.61–1.24)
GFR calc Af Amer: >60 mL/min (ref >60)
GFR calc non Af Amer: >60 mL/min (ref >60)
Glucose, Bld: 126 mg/dL — ABNORMAL HIGH (ref 70–99)
Potassium: 3.5 mmol/L (ref 3.5–5.1)
Sodium: 141 mmol/L (ref 135–145)

## 2018-12-28 LAB — LIPASE, BLOOD: Lipase: 42 U/L (ref 11–51)

## 2018-12-28 LAB — HEPATIC FUNCTION PANEL
ALT: 32 U/L (ref 0–44)
AST: 41 U/L (ref 15–41)
Albumin: 3.9 g/dL (ref 3.5–5.0)
Alkaline Phosphatase: 74 U/L (ref 38–126)
Bilirubin, Direct: 0.1 mg/dL (ref 0.0–0.2)
Indirect Bilirubin: 0.2 mg/dL — ABNORMAL LOW (ref 0.3–0.9)
Total Bilirubin: 0.3 mg/dL (ref 0.3–1.2)
Total Protein: 7 g/dL (ref 6.5–8.1)

## 2018-12-28 LAB — I-STAT TROPONIN, ED
Troponin i, poc: 0.03 ng/mL (ref 0.00–0.08)
Troponin i, poc: 0.04 ng/mL (ref 0.00–0.08)

## 2018-12-28 LAB — CBC
HCT: 47.6 % (ref 39.0–52.0)
Hemoglobin: 15.8 g/dL (ref 13.0–17.0)
MCH: 32 pg (ref 26.0–34.0)
MCHC: 33.2 g/dL (ref 30.0–36.0)
MCV: 96.6 fL (ref 80.0–100.0)
Platelets: 169 10*3/uL (ref 150–400)
RBC: 4.93 MIL/uL (ref 4.22–5.81)
RDW: 13 % (ref 11.5–15.5)
WBC: 7.6 10*3/uL (ref 4.0–10.5)
nRBC: 0 % (ref 0.0–0.2)

## 2018-12-28 LAB — BRAIN NATRIURETIC PEPTIDE: B Natriuretic Peptide: 60.5 pg/mL (ref 0.0–100.0)

## 2018-12-28 NOTE — ED Triage Notes (Signed)
Pt here with central chest pain.  Reproducible with palpation.  Pt states it feels similar to MI he had in the past.  A&Ox4

## 2018-12-28 NOTE — ED Notes (Signed)
Patient Alert and oriented to baseline. Stable and ambulatory to baseline. Patient verbalized understanding of the discharge instructions.  Patient belongings were taken by the patient.   

## 2018-12-28 NOTE — ED Provider Notes (Signed)
[facility] EMERGENCY DEPARTMENT Provider Note  CSN: [id number] Arrival date & time: 12/28/18 2951  Chief Complaint(s) Chest Pain  HPI Warren Salas is a 58 y.o. male with a history of hypertension, diabetes, COPD, CAD and prior myocardial infarctions status post stenting in January 2019 who presents to the emergency department with recurrent substernal chest pain described as sharp exacerbated with movement and palpation of the chest.  Pain is nonradiating and nonexertional.  He denies any associated shortness of breath, nausea, emesis.  No abdominal pain.  States that he is concerned this feels like his past MI.  Patient has been seen recently for similar presentation.  Last of which was December 13 and was ruled out for ACS.  Patient endorses daily alcohol consumption.  States that he drinks heavily and cannot quantify the amount.   HPI  Past Medical History Past Medical History:  Diagnosis Date  . Back pain   . COPD (chronic obstructive pulmonary disease) (HCC)   . Diabetes mellitus without complication (HCC)   . Hypertension   . STEMI (ST elevation myocardial infarction) (HCC) 10/2017   Patient Active Problem List   Diagnosis Date Noted  . Severe recurrent major depression w/psychotic features, mood-congruent (HCC) 12/05/2018  . Moderate cocaine use disorder (HCC) 11/16/2018  . MDD (major depressive disorder), recurrent severe, without psychosis (HCC) 10/27/2018  . Alcohol dependence with alcohol-induced mood disorder (HCC)   . CAD (coronary artery disease) 12/12/2017  . Old MI (myocardial infarction) 12/12/2017  . Tobacco abuse 12/12/2017  . Type 2 diabetes mellitus with complication, without long-term current use of insulin (HCC) 12/12/2017  . Acute MI, inferior wall (HCC) 11/01/2017  . Acute inferior myocardial infarction [facility])    Home Medication(s) Prior to Admission medications   Medication Sig Start Date End Date Taking? Authorizing Provider    albuterol (PROVENTIL HFA;VENTOLIN HFA) 108 (90 Base) MCG/ACT inhaler Inhale 1-2 puffs into the lungs every 6 (six) hours as needed for wheezing. 11/22/18   Micheal Likens, MD  aspirin EC 81 MG tablet Take 1 tablet (81 mg total) by mouth daily. 11/22/18   Micheal Likens, MD  atorvastatin (LIPITOR) 80 MG tablet Take 1 tablet (80 mg total) by mouth daily at 6 PM. For high cholesterol 12/07/18   Money, Gerlene Burdock, FNP  budesonide-formoterol (SYMBICORT) 160-4.5 MCG/ACT inhaler Inhale 2 puffs into the lungs 2 (two) times daily as needed (For shortness of breath.). 11/22/18   Micheal Likens, MD  gabapentin (NEURONTIN) 300 MG capsule Take 1 capsule (300 mg total) by mouth 2 (two) times daily. 12/09/18   Charm Rings, NP  hydrOXYzine (ATARAX/VISTARIL) 25 MG tablet Take 1 tablet (25 mg total) by mouth every 6 (six) hours as needed for anxiety. 12/07/18   Money, Gerlene Burdock, FNP  isosorbide mononitrate (IMDUR) 30 MG 24 hr tablet Take 1 tablet (30 mg total) by mouth daily. 12/08/18   Money, Gerlene Burdock, FNP  lisinopril (PRINIVIL,ZESTRIL) 10 MG tablet Take 0.5 tablets (5 mg total) by mouth daily. For high blood pressure 12/07/18   Money, Gerlene Burdock, FNP  metFORMIN (GLUCOPHAGE) 500 MG tablet Take 1 tablet (500 mg total) by mouth 2 (two) times daily. For high blood sugar 12/07/18   Money, Gerlene Burdock, FNP  metoprolol tartrate (LOPRESSOR) 50 MG tablet Take 1 tablet (50 mg total) by mouth 2 (two) times daily. For high blood pressure 12/07/18   Money, Gerlene Burdock, FNP  nitroGLYCERIN (NITROSTAT) 0.4 MG SL tablet Place 1 tablet (0.4 mg  total) under the tongue every 5 (five) minutes as needed. 11/22/18   Micheal Likens, MD  pantoprazole (PROTONIX) 40 MG tablet Take 1 tablet (40 mg total) by mouth daily. 12/08/18   Money, Gerlene Burdock, FNP  sertraline (ZOLOFT) 100 MG tablet Take 1 tablet (100 mg total) by mouth daily. For mood control 12/08/18   Money, Gerlene Burdock, FNP  ticagrelor (BRILINTA) 90 MG TABS  tablet Take 1 tablet (90 mg total) by mouth 2 (two) times daily. 12/07/18   Money, Gerlene Burdock, FNP                                                                                                                                    Past Surgical History Past Surgical History:  Procedure Laterality Date  . APPENDECTOMY    . CORONARY STENT INTERVENTION N/A 12/29/2017   Procedure: CORONARY STENT INTERVENTION - LAD;  Surgeon: Corky Crafts, MD;  Location: [facility] INVASIVE CV LAB;  Service: Cardiovascular;  Laterality: N/A;  . CORONARY/GRAFT ACUTE MI REVASCULARIZATION N/A 11/01/2017   Procedure: Coronary/Graft Acute MI Revascularization;  Surgeon: Corky Crafts, MD;  Location: [facility] INVASIVE CV LAB;  Service: Cardiovascular;  Laterality: N/A;  . LEFT HEART CATH AND CORONARY ANGIOGRAPHY N/A 11/01/2017   Procedure: LEFT HEART CATH AND CORONARY ANGIOGRAPHY;  Surgeon: Corky Crafts, MD;  Location: [facility] INVASIVE CV LAB;  Service: Cardiovascular;  Laterality: N/A;   Family History Family History  Problem Relation Age of Onset  . Hypertension Mother   . Diabetes Mother   . Hypertension Father   . Diabetes Father     Social History Social History   Tobacco Use  . Smoking status: Current Every Day Smoker    Packs/day: 1.00    Types: Cigarettes  . Smokeless tobacco: Never Used  Substance Use Topics  . Alcohol use: Yes    Alcohol/week: 12.0 standard drinks    Types: 12 Cans of beer per week  . Drug use: Yes    Types: Cocaine    Comment: last use 3-4 days ago.   Allergies Patient has no known allergies.  Review of Systems Review of Systems All other systems are reviewed and are negative for acute change except as noted in the HPI  Physical Exam Vital Signs  I have reviewed the triage vital signs BP 114/70   Pulse 91   Temp 98.7 F (37.1 C) (Oral)   Resp 16   Ht 5\' 6"  (1.676 m)   Wt 70 kg   SpO2 93%   BMI 24.91 kg/m   Physical Exam Vitals signs reviewed.  Constitutional:       General: He is not in acute distress.    Appearance: He is well-developed. He is not diaphoretic.     Comments: Clinically intoxicated  HENT:     Head: Normocephalic and atraumatic.     Nose: Nose normal.  Eyes:     General:  No scleral icterus.       Right eye: No discharge.        Left eye: No discharge.     Conjunctiva/sclera: Conjunctivae normal.     Pupils: Pupils are equal, round, and reactive to light.  Neck:     Musculoskeletal: Normal range of motion and neck supple.  Cardiovascular:     Rate and Rhythm: Normal rate and regular rhythm.     Heart sounds: No murmur. No friction rub. No gallop.   Pulmonary:     Effort: Pulmonary effort is normal. No respiratory distress.     Breath sounds: Normal breath sounds. No stridor. No rales.  Chest:     Chest wall: Tenderness present.    Abdominal:     General: There is no distension.     Palpations: Abdomen is soft.     Tenderness: There is no abdominal tenderness.  Musculoskeletal:        General: No tenderness.  Skin:    General: Skin is warm and dry.     Findings: No erythema or rash.  Neurological:     Mental Status: He is alert and oriented to person, place, and time.     ED Results and Treatments Labs (all labs ordered are listed, but only abnormal results are displayed) Labs Reviewed  BASIC METABOLIC PANEL - Abnormal; Notable for the following components:      Result Value   Glucose, Bld 126 (*)    Creatinine, Ser 0.59 (*)    Calcium 8.5 (*)    All other components within normal limits  HEPATIC FUNCTION PANEL - Abnormal; Notable for the following components:   Indirect Bilirubin 0.2 (*)    All other components within normal limits  CBC  BRAIN NATRIURETIC PEPTIDE  LIPASE, BLOOD  I-STAT TROPONIN, ED  I-STAT TROPONIN, ED                                                                                                                         EKG  EKG Interpretation  Date/Time:  Thursday December 28 2018  01:40:49 EST Ventricular Rate:  94 PR Interval:    QRS Duration: 93 QT Interval:  364 QTC Calculation: 456 R Axis:   88 Text Interpretation:  Sinus rhythm Minimal ST elevation, anterior leads No significant change since last tracing Confirmed by Drema Pryardama, Taria Castrillo [phone]([id number]) on 12/28/2018 2:09:38 AM      Radiology Dg Chest 2 View  Result Date: 12/28/2018 CLINICAL DATA:  Chest pain EXAM: CHEST - 2 VIEW COMPARISON:  12/18/2018 FINDINGS: The heart size and mediastinal contours are within normal limits. Both lungs are clear. The visualized skeletal structures are unremarkable. IMPRESSION: No active cardiopulmonary disease. Electronically Signed   By: Deatra RobinsonKevin  Herman M.D.   On: 12/28/2018 02:09   Pertinent labs & imaging results that were available during my care of the patient were reviewed by me and considered in my medical decision making (see chart for details).  Medications Ordered in  ED Medications - No data to display                                                                                                                                  Procedures Procedures  (including critical care time)  Medical Decision Making / ED Course I have reviewed the nursing notes for this encounter and the patient's prior records (if available in EHR or on provided paperwork).     Atypical chest pain.  EKG without acute ischemic changes or evidence of pericarditis.  Initial troponin negative.  His presentation and is more suspicious for chest wall pain which he has been seen for before.  Low suspicion for pulmonary embolism.  Presentation not classic for aortic dissection or esophageal perforation.  Chest x-ray without evidence suggestive of pneumonia, pneumothorax, pneumomediastinum.  No abnormal contour of the mediastinum to suggest dissection. No evidence of acute injuries.  Given his cardiac history, will obtain a delta troponin to ensure no cardiac involvement.  Delta Trope was  negative.  Patient was allowed to metabolize to freedom.  On reassessment patient reports complete resolution of his pain.  Final Clinical Impression(s) / ED Diagnoses Final diagnoses:  Chest wall pain  Alcoholic intoxication without complication (HCC)   Disposition: Discharge  Condition: Good  I have discussed the results, Dx and Tx plan with the patient who expressed understanding and agree(s) with the plan. Discharge instructions discussed at great length. The patient was given strict return precautions who verbalized understanding of the instructions. No further questions at time of discharge.    ED Discharge Orders    None       Follow Up: Cardiology  Call  As needed  Primary care provider  Schedule an appointment as soon as possible for a visit  If you do not have a primary care physician, contact [facility] at [phone] for referral      This chart was dictated using voice recognition software.  Despite best efforts to proofread,  errors can occur which can change the documentation meaning.   Nira Conn, MD 12/28/18 [phone]

## 2018-12-28 NOTE — ED Notes (Signed)
Patient transported to X-ray 

## 2019-01-13 ENCOUNTER — Encounter (HOSPITAL_COMMUNITY): Payer: Self-pay | Admitting: Emergency Medicine

## 2019-01-13 ENCOUNTER — Emergency Department (HOSPITAL_COMMUNITY)
Admission: EM | Admit: 2019-01-13 | Discharge: 2019-01-14 | Disposition: A | Discharge status: Home / Self Care | Payer: Medicare Other | Attending: Emergency Medicine | Admitting: Emergency Medicine

## 2019-01-13 ENCOUNTER — Other Ambulatory Visit: Payer: Self-pay

## 2019-01-13 ENCOUNTER — Emergency Department (HOSPITAL_COMMUNITY): Payer: Medicare Other

## 2019-01-13 DIAGNOSIS — Y999 Unspecified external cause status: Secondary | ICD-10-CM | POA: Diagnosis not present

## 2019-01-13 DIAGNOSIS — I10 Essential (primary) hypertension: Secondary | ICD-10-CM | POA: Diagnosis not present

## 2019-01-13 DIAGNOSIS — S20229A Contusion of unspecified back wall of thorax, initial encounter: Secondary | ICD-10-CM | POA: Insufficient documentation

## 2019-01-13 DIAGNOSIS — S3992XA Unspecified injury of lower back, initial encounter: Secondary | ICD-10-CM | POA: Diagnosis not present

## 2019-01-13 DIAGNOSIS — J449 Chronic obstructive pulmonary disease, unspecified: Secondary | ICD-10-CM | POA: Diagnosis not present

## 2019-01-13 DIAGNOSIS — I2510 Atherosclerotic heart disease of native coronary artery without angina pectoris: Secondary | ICD-10-CM | POA: Diagnosis not present

## 2019-01-13 DIAGNOSIS — F17210 Nicotine dependence, cigarettes, uncomplicated: Secondary | ICD-10-CM | POA: Insufficient documentation

## 2019-01-13 DIAGNOSIS — F10920 Alcohol use, unspecified with intoxication, uncomplicated: Secondary | ICD-10-CM

## 2019-01-13 DIAGNOSIS — S299XXA Unspecified injury of thorax, initial encounter: Secondary | ICD-10-CM | POA: Diagnosis present

## 2019-01-13 DIAGNOSIS — Z79899 Other long term (current) drug therapy: Secondary | ICD-10-CM | POA: Insufficient documentation

## 2019-01-13 DIAGNOSIS — Y92828 Other wilderness area as the place of occurrence of the external cause: Secondary | ICD-10-CM | POA: Insufficient documentation

## 2019-01-13 DIAGNOSIS — M546 Pain in thoracic spine: Secondary | ICD-10-CM | POA: Diagnosis not present

## 2019-01-13 DIAGNOSIS — W010XXA Fall on same level from slipping, tripping and stumbling without subsequent striking against object, initial encounter: Secondary | ICD-10-CM | POA: Insufficient documentation

## 2019-01-13 DIAGNOSIS — Y9301 Activity, walking, marching and hiking: Secondary | ICD-10-CM | POA: Insufficient documentation

## 2019-01-13 DIAGNOSIS — Z7982 Long term (current) use of aspirin: Secondary | ICD-10-CM | POA: Diagnosis not present

## 2019-01-13 DIAGNOSIS — F10120 Alcohol abuse with intoxication, uncomplicated: Secondary | ICD-10-CM | POA: Diagnosis not present

## 2019-01-13 DIAGNOSIS — E119 Type 2 diabetes mellitus without complications: Secondary | ICD-10-CM | POA: Diagnosis not present

## 2019-01-13 DIAGNOSIS — Z7984 Long term (current) use of oral hypoglycemic drugs: Secondary | ICD-10-CM | POA: Insufficient documentation

## 2019-01-13 DIAGNOSIS — S199XXA Unspecified injury of neck, initial encounter: Secondary | ICD-10-CM | POA: Diagnosis not present

## 2019-01-13 DIAGNOSIS — S0990XA Unspecified injury of head, initial encounter: Secondary | ICD-10-CM | POA: Diagnosis not present

## 2019-01-13 LAB — CBC WITH DIFFERENTIAL/PLATELET
Abs Immature Granulocytes: 0.03 10*3/uL (ref 0.00–0.07)
Basophils Absolute: 0.1 10*3/uL (ref 0.0–0.1)
Basophils Relative: 1 %
Eosinophils Absolute: 0.2 10*3/uL (ref 0.0–0.5)
Eosinophils Relative: 2 %
HCT: 51.5 % (ref 39.0–52.0)
Hemoglobin: 17.1 g/dL — ABNORMAL HIGH (ref 13.0–17.0)
Immature Granulocytes: 0 %
Lymphocytes Relative: 48 %
Lymphs Abs: 3.7 10*3/uL (ref 0.7–4.0)
MCH: 33.4 pg (ref 26.0–34.0)
MCHC: 33.2 g/dL (ref 30.0–36.0)
MCV: 100.6 fL — ABNORMAL HIGH (ref 80.0–100.0)
MONO ABS: 0.8 10*3/uL (ref 0.1–1.0)
Monocytes Relative: 10 %
Neutro Abs: 3 10*3/uL (ref 1.7–7.7)
Neutrophils Relative %: 39 %
Platelets: 201 10*3/uL (ref 150–400)
RBC: 5.12 MIL/uL (ref 4.22–5.81)
RDW: 14.1 % (ref 11.5–15.5)
WBC: 7.7 10*3/uL (ref 4.0–10.5)
nRBC: 0 % (ref 0.0–0.2)

## 2019-01-13 MED ORDER — MORPHINE SULFATE (PF) 4 MG/ML IV SOLN
4.0000 mg | Freq: Once | INTRAVENOUS | Status: AC
  Administered 2019-01-13: 4 mg via INTRAVENOUS
  Filled 2019-01-13: qty 1

## 2019-01-13 NOTE — ED Notes (Addendum)
Patient removed neck collar after being reminded 3x that he should not remove it. RN notified.

## 2019-01-13 NOTE — ED Provider Notes (Signed)
[facility] Provider Note   CSN: [id number] Arrival date & time: 01/13/19  2232     History   Chief Complaint Chief Complaint  Patient presents with  . Fall  . Back Pain    HPI Warren Salas is a 58 y.o. male.  The history is provided by the patient.  Fall   Back Pain  He has history of diabetes, hypertension, COPD, myocardial infarction, depression, alcohol dependence and comes in following a fall.  [place] and fell on his back.  He is complaining of pain in his lower back to me, but had complained of pain in his upper back to triage nurse.  His roommate called for EMS.  He rates pain a 10/10.  He denies head injury or loss of consciousness.  He does admit to drinking tonight but will not tell me how much.  He is supposed to be on ticagrelor, but states that he does not take his medications on a regular basis.  He is not on any anticoagulants.  Past Medical History:  Diagnosis Date  . Back pain   . COPD (chronic obstructive pulmonary disease) (HCC)   . Diabetes mellitus without complication (HCC)   . Hypertension   . STEMI (ST elevation myocardial infarction) (HCC) 10/2017    Patient Active Problem List   Diagnosis Date Noted  . Severe recurrent major depression w/psychotic features, mood-congruent (HCC) 12/05/2018  . Moderate cocaine use disorder (HCC) 11/16/2018  . MDD (major depressive disorder), recurrent severe, without psychosis (HCC) 10/27/2018  . Alcohol dependence with alcohol-induced mood disorder (HCC)   . CAD (coronary artery disease) 12/12/2017  . Old MI (myocardial infarction) 12/12/2017  . Tobacco abuse 12/12/2017  . Type 2 diabetes mellitus with complication, without long-term current use of insulin (HCC) 12/12/2017  . Acute MI, inferior wall (HCC) 11/01/2017  . Acute inferior myocardial infarction [facility](HCC)     Past Surgical History:  Procedure Laterality Date  .  APPENDECTOMY    . CORONARY STENT INTERVENTION N/A 12/29/2017   Procedure: CORONARY STENT INTERVENTION - LAD;  Surgeon: Corky CraftsVaranasi, Jayadeep S, MD;  Location: [facility] INVASIVE CV LAB;  Service: Cardiovascular;  Laterality: N/A;  . CORONARY/GRAFT ACUTE MI REVASCULARIZATION N/A 11/01/2017   Procedure: Coronary/Graft Acute MI Revascularization;  Surgeon: Corky CraftsVaranasi, Jayadeep S, MD;  [facility];  Service: Cardiovascular;  Laterality: N/A;  . LEFT HEART CATH AND CORONARY ANGIOGRAPHY N/A 11/01/2017   Procedure: LEFT HEART CATH AND CORONARY ANGIOGRAPHY;  Surgeon: Corky CraftsVaranasi, Jayadeep S, MD;  [facility];  Service: Cardiovascular;  Laterality: N/A;        Home Medications    Prior to Admission medications   Medication Sig Start Date End Date Taking? Authorizing Provider  albuterol (PROVENTIL HFA;VENTOLIN HFA) 108 (90 Base) MCG/ACT inhaler Inhale 1-2 puffs into the lungs every 6 (six) hours as needed for wheezing. 11/22/18   Micheal Likensainville, Christopher T, MD  aspirin EC 81 MG tablet Take 1 tablet (81 mg total) by mouth daily. 11/22/18   Micheal Likensainville, Christopher T, MD  atorvastatin (LIPITOR) 80 MG tablet Take 1 tablet (80 mg total) by mouth daily at 6 PM. For high cholesterol 12/07/18   Money, Gerlene Burdockravis B, FNP  budesonide-formoterol (SYMBICORT) 160-4.5 MCG/ACT inhaler Inhale 2 puffs into the lungs 2 (two) times daily as needed (For shortness of breath.). 11/22/18   Micheal Likensainville, Christopher T, MD  gabapentin (NEURONTIN) 300 MG capsule Take 1 capsule (  300 mg total) by mouth 2 (two) times daily. 12/09/18   Charm Rings, NP  hydrOXYzine (ATARAX/VISTARIL) 25 MG tablet Take 1 tablet (25 mg total) by mouth every 6 (six) hours as needed for anxiety. 12/07/18   Money, Gerlene Burdock, FNP  isosorbide mononitrate (IMDUR) 30 MG 24 hr tablet Take 1 tablet (30 mg total) by mouth daily. 12/08/18   Money, Gerlene Burdock, FNP  lisinopril (PRINIVIL,ZESTRIL) 10 MG tablet Take 0.5 tablets (5 mg total) by mouth daily. For  high blood pressure 12/07/18   Money, Gerlene Burdock, FNP  metFORMIN (GLUCOPHAGE) 500 MG tablet Take 1 tablet (500 mg total) by mouth 2 (two) times daily. For high blood sugar 12/07/18   Money, Gerlene Burdock, FNP  metoprolol tartrate (LOPRESSOR) 50 MG tablet Take 1 tablet (50 mg total) by mouth 2 (two) times daily. For high blood pressure 12/07/18   Money, Gerlene Burdock, FNP  nitroGLYCERIN (NITROSTAT) 0.4 MG SL tablet Place 1 tablet (0.4 mg total) under the tongue every 5 (five) minutes as needed. 11/22/18   Micheal Likens, MD  pantoprazole (PROTONIX) 40 MG tablet Take 1 tablet (40 mg total) by mouth daily. 12/08/18   Money, Gerlene Burdock, FNP  sertraline (ZOLOFT) 100 MG tablet Take 1 tablet (100 mg total) by mouth daily. For mood control 12/08/18   Money, Gerlene Burdock, FNP  ticagrelor (BRILINTA) 90 MG TABS tablet Take 1 tablet (90 mg total) by mouth 2 (two) times daily. 12/07/18   Money, Gerlene Burdock, FNP    Family History Family History  Problem Relation Age of Onset  . Hypertension Mother   . Diabetes Mother   . Hypertension Father   . Diabetes Father     Social History Social History   Tobacco Use  . Smoking status: Current Every Day Smoker    Packs/day: 1.00    Types: Cigarettes  . Smokeless tobacco: Never Used  Substance Use Topics  . Alcohol use: Yes    Alcohol/week: 12.0 standard drinks    Types: 12 Cans of beer per week  . Drug use: Yes    Types: Cocaine    Comment: last use 3-4 days ago.     Allergies   Patient has no known allergies.   Review of Systems Review of Systems  Musculoskeletal: Positive for back pain.  All other systems reviewed and are negative.    Physical Exam Updated Vital Signs BP (!) 136/93 (BP Location: Right Arm)   Pulse 87   Temp 98.2 F (36.8 C) (Oral)   Resp 16   SpO2 100%   Physical Exam Vitals signs and nursing note reviewed.    58 year old male, resting comfortably and in no acute distress. Vital signs are significant for borderline  elevated blood pressure. Oxygen saturation is 100%, which is normal. Head is normocephalic and atraumatic. PERRLA, EOMI. Oropharynx is clear. Neck is mildly tender diffusely in the midline.  There is no swelling or deformity.  There is no adenopathy or JVD. Back is tender diffusely in the midline without swelling or deformity.  There is no CVA tenderness. Lungs are clear without rales, wheezes, or rhonchi. Chest is nontender. Heart has regular rate and rhythm without murmur. Abdomen is soft, flat, nontender without masses or hepatosplenomegaly and peristalsis is normoactive. Extremities have no cyanosis or edema, full range of motion is present. Skin is warm and dry without rash. Neurologic: Mental status is normal, cranial nerves are intact, there are no motor or sensory deficits.  ED Treatments /  Results  Labs (all labs ordered are listed, but only abnormal results are displayed) Labs Reviewed  URINALYSIS, ROUTINE W REFLEX MICROSCOPIC - Abnormal; Notable for the following components:      Result Value   Color, Urine STRAW (*)    Specific Gravity, Urine 1.003 (*)    All other components within normal limits  CBC WITH DIFFERENTIAL/PLATELET - Abnormal; Notable for the following components:   Hemoglobin 17.1 (*)    MCV 100.6 (*)    All other components within normal limits  COMPREHENSIVE METABOLIC PANEL - Abnormal; Notable for the following components:   Glucose, Bld 107 (*)    BUN <5 (*)    Calcium 8.7 (*)    AST 47 (*)    Total Bilirubin 0.2 (*)    All other components within normal limits  ETHANOL - Abnormal; Notable for the following components:   Alcohol, Ethyl (B) 327 (*)    All other components within normal limits   Radiology Dg Thoracic Spine W/swimmers  Result Date: 01/14/2019 CLINICAL DATA:  Initial evaluation for acute upper back pain status post fall. EXAM: THORACIC SPINE - 3 VIEWS COMPARISON:  Prior radiograph from 12/28/2018 FINDINGS: Vertebral bodies normally  aligned with preservation of the normal thoracic kyphosis. No listhesis or subluxation. Vertebral body heights maintained without evidence for acute or chronic fracture. Mild degenerative spurring within the lower thoracic spine. Visible soft tissues demonstrate no acute finding. IMPRESSION: No radiographic evidence for acute abnormality within the thoracic spine. Electronically Signed   By: Rise Mu M.D.   On: 01/14/2019 01:10   Dg Lumbar Spine Complete  Result Date: 01/14/2019 CLINICAL DATA:  Initial evaluation for acute trauma, fall. EXAM: LUMBAR SPINE - COMPLETE 4+ VIEW COMPARISON:  Prior radiograph from 02/21/2013. FINDINGS: Five non rib-bearing lumbar type vertebral bodies. Suspected at least unilateral pars defect at the level of L4. Trace spondylolisthesis, similar to previous. Alignment otherwise normal with preservation of the normal lumbar lordosis. Vertebral body heights maintained without evidence for acute or chronic fracture. Sacrum intact. SI joints approximated symmetric. Visualized bony pelvis intact. Mild degenerative intervertebral disc space narrowing present at T11-12 and T12-L1. Intervertebral disc space height otherwise relatively maintained. Scattered atherosclerotic plaque noted within the intra-abdominal aorta. Visible soft tissues demonstrate no acute finding. IMPRESSION: 1. No radiographic evidence for acute traumatic injury within the lumbar spine. 2. Trace spondylolisthesis of L4 on L5. 3. Aortic atherosclerosis. Electronically Signed   By: Rise Mu M.D.   On: 01/14/2019 02:28   Ct Head Wo Contrast  Result Date: 01/14/2019 CLINICAL DATA:  58 year old male with fall. EXAM: CT HEAD WITHOUT CONTRAST CT CERVICAL SPINE WITHOUT CONTRAST TECHNIQUE: Multidetector CT imaging of the head and cervical spine was performed following the standard protocol without intravenous contrast. Multiplanar CT image reconstructions of the cervical spine were also generated.  COMPARISON:  Head CT dated 12/10/2018 FINDINGS: CT HEAD FINDINGS Brain: There is mild age-related atrophy. The gray-white matter discrimination is preserved. There is no acute intracranial hemorrhage. No mass effect or midline shift. No extra-axial fluid collection. Vascular: No hyperdense vessel or unexpected calcification. Skull: Normal. Negative for fracture or focal lesion. Sinuses/Orbits: There is diffuse mucoperiosteal thickening of paranasal sinuses with near complete opacification of the maxillary sinuses. No air-fluid levels. The mastoid air cells are clear. Other: None CT CERVICAL SPINE FINDINGS Alignment: No acute subluxation. There is straightening of normal cervical lordosis which may be positional or due to muscle spasm. Skull base and vertebrae: No acute fracture. Soft tissues and  spinal canal: No prevertebral fluid or swelling. No visible canal hematoma. Disc levels: Mild degenerative changes. Multilevel facet arthropathy. Upper chest: Negative. Other: None IMPRESSION: 1. No acute intracranial hemorrhage. 2. No acute/traumatic cervical spine pathology. Electronically Signed   By: Elgie CollardArash  Radparvar M.D.   On: 01/14/2019 00:33   Ct Cervical Spine Wo Contrast  Result Date: 01/14/2019 CLINICAL DATA:  58 year old male with fall. EXAM: CT HEAD WITHOUT CONTRAST CT CERVICAL SPINE WITHOUT CONTRAST TECHNIQUE: Multidetector CT imaging of the head and cervical spine was performed following the standard protocol without intravenous contrast. Multiplanar CT image reconstructions of the cervical spine were also generated. COMPARISON:  Head CT dated 12/10/2018 FINDINGS: CT HEAD FINDINGS Brain: There is mild age-related atrophy. The gray-white matter discrimination is preserved. There is no acute intracranial hemorrhage. No mass effect or midline shift. No extra-axial fluid collection. Vascular: No hyperdense vessel or unexpected calcification. Skull: Normal. Negative for fracture or focal lesion. Sinuses/Orbits:  There is diffuse mucoperiosteal thickening of paranasal sinuses with near complete opacification of the maxillary sinuses. No air-fluid levels. The mastoid air cells are clear. Other: None CT CERVICAL SPINE FINDINGS Alignment: No acute subluxation. There is straightening of normal cervical lordosis which may be positional or due to muscle spasm. Skull base and vertebrae: No acute fracture. Soft tissues and spinal canal: No prevertebral fluid or swelling. No visible canal hematoma. Disc levels: Mild degenerative changes. Multilevel facet arthropathy. Upper chest: Negative. Other: None IMPRESSION: 1. No acute intracranial hemorrhage. 2. No acute/traumatic cervical spine pathology. Electronically Signed   By: Elgie CollardArash  Radparvar M.D.   On: 01/14/2019 00:33    Procedures Procedures  Medications Ordered in ED Medications  traMADol (ULTRAM) tablet 50 mg (has no administration in time range)  morphine 4 MG/ML injection 4 mg (4 mg Intravenous Given 01/13/19 2336)     Initial Impression / Assessment and Plan / ED Course  I have reviewed the triage vital signs and the nursing notes.  Pertinent labs & imaging results that were available during my care of the patient were reviewed by me and considered in my medical decision making (see chart for details).  Fall with pain throughout his back and neck.  There are no neurologic deficits.  He apparently has been refusing to keep his cervical collar on and is laying in the left lateral decubitus position.  Old records are reviewed showing multiple visits for alcohol abuse and back pain.  Will send for x-rays of thoracic and lumbar spine, CT of head and cervical spine.  CT scans show no acute injury.  X-rays of thoracic and lumbar spine also show no acute injury.  Labs are significant for ethanol level over 300 consistent with ethanol intoxication.  He is requesting additional pain killers.  [place] controlled substance reporting website was  reviewed, and he has no narcotic prescriptions on file.  However, with his degree of ethanol use, I am concerned about giving him stronger narcotics.  He is discharged with a prescription for tramadol, advised to keep ice on sore areas.  Recommended he abstain from alcohol.  Final Clinical Impressions(s) / ED Diagnoses   Final diagnoses:  Fall from slip, trip, or stumble, initial encounter  Contusion of back, initial encounter  Alcohol intoxication, uncomplicated [facility](HCC)    ED Discharge Orders         Ordered    traMADol (ULTRAM) 50 MG tablet  Every 6 hours PRN     01/14/19 0409  Dione Booze, MD 01/14/19 [phone]

## 2019-01-13 NOTE — ED Triage Notes (Signed)
Pt arrives via [facility] reporting witnessed fall straight back from standing. Pt c/o severe upper back pain, swelling noted by EMS.  Pt denies LOC, no blood thinners.   Pt reports ETOH. C-collar placed by EMS, pt not able to follow spinal precautions. Pt ambulatory on scene per EMS.

## 2019-01-14 ENCOUNTER — Emergency Department (HOSPITAL_COMMUNITY): Payer: Medicare Other

## 2019-01-14 DIAGNOSIS — S199XXA Unspecified injury of neck, initial encounter: Secondary | ICD-10-CM | POA: Diagnosis not present

## 2019-01-14 DIAGNOSIS — S0990XA Unspecified injury of head, initial encounter: Secondary | ICD-10-CM | POA: Diagnosis not present

## 2019-01-14 DIAGNOSIS — M546 Pain in thoracic spine: Secondary | ICD-10-CM | POA: Diagnosis not present

## 2019-01-14 DIAGNOSIS — S3992XA Unspecified injury of lower back, initial encounter: Secondary | ICD-10-CM | POA: Diagnosis not present

## 2019-01-14 DIAGNOSIS — S20229A Contusion of unspecified back wall of thorax, initial encounter: Secondary | ICD-10-CM | POA: Diagnosis not present

## 2019-01-14 DIAGNOSIS — S299XXA Unspecified injury of thorax, initial encounter: Secondary | ICD-10-CM | POA: Diagnosis not present

## 2019-01-14 LAB — URINALYSIS, ROUTINE W REFLEX MICROSCOPIC
Bilirubin Urine: NEGATIVE
Glucose, UA: NEGATIVE mg/dL
Hgb urine dipstick: NEGATIVE
Ketones, ur: NEGATIVE mg/dL
LEUKOCYTES UA: NEGATIVE
Nitrite: NEGATIVE
Protein, ur: NEGATIVE mg/dL
Specific Gravity, Urine: 1.003 — ABNORMAL LOW (ref 1.005–1.030)
pH: 5 (ref 5.0–8.0)

## 2019-01-14 LAB — COMPREHENSIVE METABOLIC PANEL
ALT: 37 U/L (ref 0–44)
AST: 47 U/L — ABNORMAL HIGH (ref 15–41)
Albumin: 4.2 g/dL (ref 3.5–5.0)
Alkaline Phosphatase: 69 U/L (ref 38–126)
Anion gap: 13 (ref 5–15)
BUN: <5 mg/dL — ABNORMAL LOW (ref 6–20)
CO2: 25 mmol/L (ref 22–32)
Calcium: 8.7 mg/dL — ABNORMAL LOW (ref 8.9–10.3)
Chloride: 100 mmol/L (ref 98–111)
Creatinine, Ser: 0.67 mg/dL (ref 0.61–1.24)
GFR calc non Af Amer: >60 mL/min (ref >60)
Glucose, Bld: 107 mg/dL — ABNORMAL HIGH (ref 70–99)
Potassium: 3.7 mmol/L (ref 3.5–5.1)
Sodium: 138 mmol/L (ref 135–145)
Total Bilirubin: 0.2 mg/dL — ABNORMAL LOW (ref 0.3–1.2)
Total Protein: 7.5 g/dL (ref 6.5–8.1)

## 2019-01-14 LAB — ETHANOL: Alcohol, Ethyl (B): 327 mg/dL (ref <10)

## 2019-01-14 MED ORDER — TRAMADOL HCL 50 MG PO TABS: 50.0000 mg | ORAL_TABLET | Freq: Four times a day (QID) | ORAL | 0 refills | Status: DC | PRN

## 2019-01-14 MED ORDER — TRAMADOL HCL 50 MG PO TABS
50.0000 mg | ORAL_TABLET | Freq: Once | ORAL | Status: AC
  Administered 2019-01-14: 50 mg via ORAL
  Filled 2019-01-14: qty 1

## 2019-01-14 NOTE — Discharge Instructions (Addendum)
Apply ice to sore areas as needed.  Take acetaminophen or ibuprofen as needed for less severe pain.

## 2019-01-14 NOTE — ED Notes (Signed)
Patient currently at X-ray .  

## 2019-01-19 ENCOUNTER — Emergency Department (HOSPITAL_COMMUNITY)
Admission: EM | Admit: 2019-01-19 | Discharge: 2019-01-20 | Disposition: A | Discharge status: Home / Self Care | Payer: Medicare Other | Attending: Emergency Medicine | Admitting: Emergency Medicine

## 2019-01-19 ENCOUNTER — Other Ambulatory Visit: Payer: Self-pay

## 2019-01-19 ENCOUNTER — Encounter (HOSPITAL_COMMUNITY): Payer: Self-pay | Admitting: Emergency Medicine

## 2019-01-19 DIAGNOSIS — I10 Essential (primary) hypertension: Secondary | ICD-10-CM | POA: Diagnosis not present

## 2019-01-19 DIAGNOSIS — J449 Chronic obstructive pulmonary disease, unspecified: Secondary | ICD-10-CM | POA: Diagnosis not present

## 2019-01-19 DIAGNOSIS — Z79899 Other long term (current) drug therapy: Secondary | ICD-10-CM | POA: Insufficient documentation

## 2019-01-19 DIAGNOSIS — F1010 Alcohol abuse, uncomplicated: Secondary | ICD-10-CM | POA: Insufficient documentation

## 2019-01-19 DIAGNOSIS — F17210 Nicotine dependence, cigarettes, uncomplicated: Secondary | ICD-10-CM | POA: Insufficient documentation

## 2019-01-19 DIAGNOSIS — Z7982 Long term (current) use of aspirin: Secondary | ICD-10-CM | POA: Diagnosis not present

## 2019-01-19 DIAGNOSIS — M545 Low back pain: Secondary | ICD-10-CM | POA: Diagnosis not present

## 2019-01-19 DIAGNOSIS — I2510 Atherosclerotic heart disease of native coronary artery without angina pectoris: Secondary | ICD-10-CM | POA: Diagnosis not present

## 2019-01-19 DIAGNOSIS — E119 Type 2 diabetes mellitus without complications: Secondary | ICD-10-CM | POA: Diagnosis not present

## 2019-01-19 DIAGNOSIS — G8929 Other chronic pain: Secondary | ICD-10-CM | POA: Insufficient documentation

## 2019-01-19 DIAGNOSIS — Z7984 Long term (current) use of oral hypoglycemic drugs: Secondary | ICD-10-CM | POA: Insufficient documentation

## 2019-01-19 DIAGNOSIS — R0902 Hypoxemia: Secondary | ICD-10-CM | POA: Diagnosis not present

## 2019-01-19 NOTE — ED Notes (Signed)
Pt assisted to the restroom

## 2019-01-19 NOTE — ED Triage Notes (Signed)
[facility] Patient was at a store. Store called EMS because they thought patient was sick. Patient states he is not sick. He states he is drunk and crazy.

## 2019-01-20 ENCOUNTER — Emergency Department (HOSPITAL_COMMUNITY): Payer: Medicare Other

## 2019-01-20 DIAGNOSIS — M545 Low back pain: Secondary | ICD-10-CM | POA: Diagnosis not present

## 2019-01-20 DIAGNOSIS — F1010 Alcohol abuse, uncomplicated: Secondary | ICD-10-CM | POA: Diagnosis not present

## 2019-01-20 NOTE — ED Notes (Signed)
Pt ambulated to the restroom with stand by assist. Pt is very wobbly on his feet.

## 2019-01-20 NOTE — ED Notes (Signed)
Pt up and getting on coat.   THis Rn went to get patient's discharge paperwork.  When returned patient had left without discharge instructions.

## 2019-01-20 NOTE — ED Notes (Signed)
Pt resting in bed at this time. 

## 2019-01-20 NOTE — Discharge Instructions (Addendum)
1. Medications: usual home medications 2. Treatment: rest, drink plenty of fluids, gentle stretching as discussed, alternate ice and heat 3. Follow Up: Please followup with your primary doctor in 3 days for discussion of your diagnoses and further evaluation after today's visit; if you do not have a primary care doctor use the resource guide provided to find one;  Return to the ER for worsening back pain, difficulty walking, loss of bowel or bladder control or other concerning symptoms     

## 2019-01-20 NOTE — ED Notes (Signed)
Pt cursing at staff in hallway. Pt is easily redirectable.

## 2019-01-20 NOTE — ED Provider Notes (Signed)
[facility]-EMERGENCY DEPT Provider Note   CSN: [id number] Arrival date & time: 01/19/19  2225     History   Chief Complaint Chief Complaint  Patient presents with  . Alcohol Intoxication    HPI Warren Salas is a 58 y.o. male with a hx of neck back pain, COPD, hypertension, cocaine abuse, alcohol dependence presents to the Emergency Department complaining of acute on chronic back pain.  Patient arrives via EMS.  They report that they were called to the store because the employees thought the patient was sick.  The patient reports to EMS and RN that he is not sick but that he is drunk.  Patient tells me that he did not fall night but did fall several days ago and has had worsening back pain.  He has a history of back injury.  Patient again reports to me that he is drunk.  He denies weakness, numbness, tingling, loss of bowel or bladder control.  He reports that he has been able to walk.  No treatments prior to arrival.   The history is provided by the patient and medical records. No language interpreter was used.    Past Medical History:  Diagnosis Date  . Back pain   . COPD (chronic obstructive pulmonary disease) (HCC)   . Diabetes mellitus without complication (HCC)   . Hypertension   . STEMI (ST elevation myocardial infarction) (HCC) 10/2017    Patient Active Problem List   Diagnosis Date Noted  . Severe recurrent major depression w/psychotic features, mood-congruent (HCC) 12/05/2018  . Moderate cocaine use disorder (HCC) 11/16/2018  . MDD (major depressive disorder), recurrent severe, without psychosis (HCC) 10/27/2018  . Alcohol dependence with alcohol-induced mood disorder (HCC)   . CAD (coronary artery disease) 12/12/2017  . Old MI (myocardial infarction) 12/12/2017  . Tobacco abuse 12/12/2017  . Type 2 diabetes mellitus with complication, without long-term current use of insulin (HCC) 12/12/2017  . Acute MI, inferior wall (HCC) 11/01/2017  .  Acute inferior myocardial infarction [facility])     Past Surgical History:  Procedure Laterality Date  . APPENDECTOMY    . CORONARY STENT INTERVENTION N/A 12/29/2017   Procedure: CORONARY STENT INTERVENTION - LAD;  Surgeon: Corky Crafts, MD;  Location: [facility] INVASIVE CV LAB;  Service: Cardiovascular;  Laterality: N/A;  . CORONARY/GRAFT ACUTE MI REVASCULARIZATION N/A 11/01/2017   Procedure: Coronary/Graft Acute MI Revascularization;  Surgeon: Corky Crafts, MD;  Location: [facility] INVASIVE CV LAB;  Service: Cardiovascular;  Laterality: N/A;  . LEFT HEART CATH AND CORONARY ANGIOGRAPHY N/A 11/01/2017   Procedure: LEFT HEART CATH AND CORONARY ANGIOGRAPHY;  Surgeon: Corky Crafts, MD;  Location: [facility] INVASIVE CV LAB;  Service: Cardiovascular;  Laterality: N/A;        Home Medications    Prior to Admission medications   Medication Sig Start Date End Date Taking? Authorizing Provider  albuterol (PROVENTIL HFA;VENTOLIN HFA) 108 (90 Base) MCG/ACT inhaler Inhale 1-2 puffs into the lungs every 6 (six) hours as needed for wheezing. 11/22/18   Micheal Likens, MD  aspirin EC 81 MG tablet Take 1 tablet (81 mg total) by mouth daily. 11/22/18   Micheal Likens, MD  atorvastatin (LIPITOR) 80 MG tablet Take 1 tablet (80 mg total) by mouth daily at 6 PM. For high cholesterol 12/07/18   Money, Gerlene Burdock, FNP  budesonide-formoterol (SYMBICORT) 160-4.5 MCG/ACT inhaler Inhale 2 puffs into the lungs 2 (two) times daily as needed (For shortness of breath.). 11/22/18   [place],  Burlene Arnthristopher T, MD  gabapentin (NEURONTIN) 300 MG capsule Take 1 capsule (300 mg total) by mouth 2 (two) times daily. 12/09/18   Charm RingsLord, Jamison Y, NP  hydrOXYzine (ATARAX/VISTARIL) 25 MG tablet Take 1 tablet (25 mg total) by mouth every 6 (six) hours as needed for anxiety. 12/07/18   Money, Gerlene Burdockravis B, FNP  isosorbide mononitrate (IMDUR) 30 MG 24 hr tablet Take 1 tablet (30 mg total) by mouth daily. 12/08/18   Money, Gerlene Burdockravis  B, FNP  lisinopril (PRINIVIL,ZESTRIL) 10 MG tablet Take 0.5 tablets (5 mg total) by mouth daily. For high blood pressure 12/07/18   Money, Gerlene Burdockravis B, FNP  metFORMIN (GLUCOPHAGE) 500 MG tablet Take 1 tablet (500 mg total) by mouth 2 (two) times daily. For high blood sugar 12/07/18   Money, Gerlene Burdockravis B, FNP  metoprolol tartrate (LOPRESSOR) 50 MG tablet Take 1 tablet (50 mg total) by mouth 2 (two) times daily. For high blood pressure 12/07/18   Money, Gerlene Burdockravis B, FNP  nitroGLYCERIN (NITROSTAT) 0.4 MG SL tablet Place 1 tablet (0.4 mg total) under the tongue every 5 (five) minutes as needed. 11/22/18   Micheal Likensainville, Christopher T, MD  pantoprazole (PROTONIX) 40 MG tablet Take 1 tablet (40 mg total) by mouth daily. 12/08/18   Money, Gerlene Burdockravis B, FNP  sertraline (ZOLOFT) 100 MG tablet Take 1 tablet (100 mg total) by mouth daily. For mood control 12/08/18   Money, Gerlene Burdockravis B, FNP  ticagrelor (BRILINTA) 90 MG TABS tablet Take 1 tablet (90 mg total) by mouth 2 (two) times daily. 12/07/18   Money, Gerlene Burdockravis B, FNP  traMADol (ULTRAM) 50 MG tablet Take 1 tablet (50 mg total) by mouth every 6 (six) hours as needed. 01/14/19   Dione BoozeGlick, David, MD    Family History Family History  Problem Relation Age of Onset  . Hypertension Mother   . Diabetes Mother   . Hypertension Father   . Diabetes Father     Social History Social History   Tobacco Use  . Smoking status: Current Every Day Smoker    Packs/day: 1.00    Types: Cigarettes  . Smokeless tobacco: Never Used  Substance Use Topics  . Alcohol use: Yes    Alcohol/week: 12.0 standard drinks    Types: 12 Cans of beer per week  . Drug use: Yes    Types: Cocaine    Comment: last use 3-4 days ago.     Allergies   Patient has no known allergies.   Review of Systems Review of Systems  Constitutional: Negative for appetite change, diaphoresis, fatigue, fever and unexpected weight change.  HENT: Negative for mouth sores.   Eyes: Negative for visual disturbance.    Respiratory: Negative for cough, chest tightness, shortness of breath and wheezing.   Cardiovascular: Negative for chest pain.  Gastrointestinal: Negative for abdominal pain, constipation, diarrhea, nausea and vomiting.  Endocrine: Negative for polydipsia, polyphagia and polyuria.  Genitourinary: Negative for dysuria, frequency, hematuria and urgency.  Musculoskeletal: Positive for back pain. Negative for neck stiffness.  Skin: Negative for rash.  Allergic/Immunologic: Negative for immunocompromised state.  Neurological: Negative for syncope, light-headedness and headaches.  Hematological: Does not bruise/bleed easily.  Psychiatric/Behavioral: Negative for sleep disturbance. The patient is not nervous/anxious.      Physical Exam Updated Vital Signs BP (!) 148/105 (BP Location: Right Arm)   Pulse (!) 6   Temp 98.2 F (36.8 C) (Oral)   Resp 19   Ht 5\' 6"  (1.676 m)   Wt 70 kg   SpO2 95%  BMI 24.91 kg/m   Physical Exam Vitals signs and nursing note reviewed.  Constitutional:      General: He is not in acute distress.    Appearance: He is well-developed. He is not diaphoretic.     Comments: Sleeping but able to be awakened.  Patient is intoxicated.  He smells of EtOH.  HENT:     Head: Normocephalic and atraumatic.     Mouth/Throat:     Pharynx: No oropharyngeal exudate.  Eyes:     General: No scleral icterus.    Conjunctiva/sclera: Conjunctivae normal.  Neck:     Musculoskeletal: Normal range of motion and neck supple.     Comments: Full ROM without pain Cardiovascular:     Rate and Rhythm: Normal rate and regular rhythm.  Pulmonary:     Effort: Pulmonary effort is normal. No respiratory distress.     Breath sounds: Normal breath sounds. No wheezing.  Abdominal:     General: Bowel sounds are normal. There is no distension.     Palpations: Abdomen is soft. There is no mass.     Tenderness: There is no abdominal tenderness. There is no guarding or rebound.   Musculoskeletal: Normal range of motion.     Comments: Full range of motion of the T-spine and L-spine No midline tenderness to the  T-spine. Tenderness to palpation of the bilateral paraspinous muscles of the L-spine and mild midline tenderness of the L-spine without step-off or deformity.  Lymphadenopathy:     Cervical: No cervical adenopathy.  Skin:    General: Skin is warm and dry.     Findings: No erythema or rash.  Neurological:     Mental Status: He is alert.     Comments: Speech is clear and goal oriented Moves extremities without ataxia 5/5 in the bilateral upper and lower extremities.  Sensation intact to normal touch.  Gait testing deferred initially due to intoxication.  Psychiatric:        Behavior: Behavior normal.      ED Treatments / Results   Radiology Dg Lumbar Spine Complete  Result Date: 01/20/2019 CLINICAL DATA:  Fall with back pain EXAM: LUMBAR SPINE - COMPLETE 4+ VIEW COMPARISON:  None. FINDINGS: There is no evidence of lumbar spine fracture. Alignment is normal. Intervertebral disc spaces are maintained. Lower lumbar facet hypertrophy. IMPRESSION: No acute abnormality of the lumbar spine. Electronically Signed   By: Deatra RobinsonKevin  Herman M.D.   On: 01/20/2019 00:21    Procedures Procedures (including critical care time)  Medications Ordered in ED Medications - No data to display   Initial Impression / Assessment and Plan / ED Course  I have reviewed the triage vital signs and the nursing notes.  Pertinent labs & imaging results that were available during my care of the patient were reviewed by me and considered in my medical decision making (see chart for details).  Clinical Course as of Jan 21 608  Sat Jan 20, 2019  0602 She ambulates here in the emergency department without difficulty.   [HM]    Clinical Course User Index [HM] Mariabella Nilsen, Dahlia ClientHannah, PA-C    Patient presents to the emergency department via EMS.  Unclear on the specific reason  however he does complain of back pain.  Reports a recent fall.  L-spine films without acute abnormality.  Patient allowed to sober here in the emergency department.  He has tolerated p.o. and is ambulatory without difficulty and without assistance here in the emergency department.  He is without  additional complaints at this time.  Will discharge to home.  Final Clinical Impressions(s) / ED Diagnoses   Final diagnoses:  Chronic bilateral low back pain without sciatica  Alcohol abuse    ED Discharge Orders    None       Mardene Sayer Boyd Kerbs 01/20/19 6004    Molpus, Jonny Ruiz, MD 01/20/19 0730

## 2019-01-26 ENCOUNTER — Other Ambulatory Visit: Payer: Self-pay

## 2019-01-26 ENCOUNTER — Encounter (HOSPITAL_COMMUNITY): Payer: Self-pay

## 2019-01-26 ENCOUNTER — Emergency Department (HOSPITAL_COMMUNITY)
Admission: EM | Admit: 2019-01-26 | Discharge: 2019-01-27 | Disposition: A | Discharge status: Home / Self Care | Payer: Medicare Other | Attending: Emergency Medicine | Admitting: Emergency Medicine

## 2019-01-26 DIAGNOSIS — J449 Chronic obstructive pulmonary disease, unspecified: Secondary | ICD-10-CM | POA: Diagnosis not present

## 2019-01-26 DIAGNOSIS — Y999 Unspecified external cause status: Secondary | ICD-10-CM | POA: Insufficient documentation

## 2019-01-26 DIAGNOSIS — X030XXA Exposure to flames in controlled fire, not in building or structure, initial encounter: Secondary | ICD-10-CM | POA: Diagnosis not present

## 2019-01-26 DIAGNOSIS — Y939 Activity, unspecified: Secondary | ICD-10-CM | POA: Diagnosis not present

## 2019-01-26 DIAGNOSIS — T2122XA Burn of second degree of abdominal wall, initial encounter: Secondary | ICD-10-CM | POA: Insufficient documentation

## 2019-01-26 DIAGNOSIS — I10 Essential (primary) hypertension: Secondary | ICD-10-CM | POA: Insufficient documentation

## 2019-01-26 DIAGNOSIS — F1490 Cocaine use, unspecified, uncomplicated: Secondary | ICD-10-CM | POA: Insufficient documentation

## 2019-01-26 DIAGNOSIS — Z7984 Long term (current) use of oral hypoglycemic drugs: Secondary | ICD-10-CM | POA: Diagnosis not present

## 2019-01-26 DIAGNOSIS — T300 Burn of unspecified body region, unspecified degree: Secondary | ICD-10-CM | POA: Diagnosis not present

## 2019-01-26 DIAGNOSIS — F17210 Nicotine dependence, cigarettes, uncomplicated: Secondary | ICD-10-CM | POA: Diagnosis not present

## 2019-01-26 DIAGNOSIS — W19XXXA Unspecified fall, initial encounter: Secondary | ICD-10-CM | POA: Diagnosis not present

## 2019-01-26 DIAGNOSIS — Z590 Homelessness: Secondary | ICD-10-CM | POA: Insufficient documentation

## 2019-01-26 DIAGNOSIS — I252 Old myocardial infarction: Secondary | ICD-10-CM | POA: Diagnosis not present

## 2019-01-26 DIAGNOSIS — Z79899 Other long term (current) drug therapy: Secondary | ICD-10-CM | POA: Diagnosis not present

## 2019-01-26 DIAGNOSIS — Y929 Unspecified place or not applicable: Secondary | ICD-10-CM | POA: Diagnosis not present

## 2019-01-26 DIAGNOSIS — E119 Type 2 diabetes mellitus without complications: Secondary | ICD-10-CM | POA: Insufficient documentation

## 2019-01-26 DIAGNOSIS — R52 Pain, unspecified: Secondary | ICD-10-CM | POA: Diagnosis not present

## 2019-01-26 MED ORDER — SILVER SULFADIAZINE 1 % EX CREA
TOPICAL_CREAM | Freq: Once | CUTANEOUS | Status: AC
  Administered 2019-01-27: via TOPICAL
  Filled 2019-01-26: qty 85

## 2019-01-26 MED ORDER — HYDROCODONE-ACETAMINOPHEN 5-325 MG PO TABS
2.0000 | ORAL_TABLET | Freq: Once | ORAL | Status: AC
  Administered 2019-01-27: 2 via ORAL
  Filled 2019-01-26: qty 2
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
## 2019-01-27 DIAGNOSIS — T2122XA Burn of second degree of abdominal wall, initial encounter: Secondary | ICD-10-CM | POA: Diagnosis not present

## 2019-01-27 MED ORDER — HYDROCODONE-ACETAMINOPHEN 5-325 MG PO TABS: 1.0000 | ORAL_TABLET | Freq: Four times a day (QID) | ORAL | 0 refills | Status: DC | PRN
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
## 2019-01-27 NOTE — ED Notes (Signed)
Pt requesting food
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
## 2019-02-08 ENCOUNTER — Emergency Department (HOSPITAL_COMMUNITY): Payer: Medicare Other

## 2019-02-08 ENCOUNTER — Encounter (HOSPITAL_COMMUNITY): Payer: Self-pay | Admitting: Emergency Medicine

## 2019-02-08 ENCOUNTER — Emergency Department (HOSPITAL_COMMUNITY)
Admission: EM | Admit: 2019-02-08 | Discharge: 2019-02-08 | Payer: Medicare Other | Attending: Emergency Medicine | Admitting: Emergency Medicine

## 2019-02-08 DIAGNOSIS — Z049 Encounter for examination and observation for unspecified reason: Secondary | ICD-10-CM | POA: Diagnosis not present

## 2019-02-08 DIAGNOSIS — R509 Fever, unspecified: Secondary | ICD-10-CM | POA: Diagnosis not present

## 2019-02-08 DIAGNOSIS — J449 Chronic obstructive pulmonary disease, unspecified: Secondary | ICD-10-CM | POA: Diagnosis not present

## 2019-02-08 DIAGNOSIS — F17210 Nicotine dependence, cigarettes, uncomplicated: Secondary | ICD-10-CM | POA: Diagnosis not present

## 2019-02-08 DIAGNOSIS — Y999 Unspecified external cause status: Secondary | ICD-10-CM | POA: Diagnosis not present

## 2019-02-08 DIAGNOSIS — L089 Local infection of the skin and subcutaneous tissue, unspecified: Secondary | ICD-10-CM | POA: Insufficient documentation

## 2019-02-08 DIAGNOSIS — Z79899 Other long term (current) drug therapy: Secondary | ICD-10-CM | POA: Insufficient documentation

## 2019-02-08 DIAGNOSIS — I10 Essential (primary) hypertension: Secondary | ICD-10-CM | POA: Insufficient documentation

## 2019-02-08 DIAGNOSIS — T2101XA Burn of unspecified degree of chest wall, initial encounter: Secondary | ICD-10-CM | POA: Diagnosis not present

## 2019-02-08 DIAGNOSIS — Y929 Unspecified place or not applicable: Secondary | ICD-10-CM | POA: Diagnosis not present

## 2019-02-08 DIAGNOSIS — Z9114 Patient's other noncompliance with medication regimen: Secondary | ICD-10-CM | POA: Diagnosis not present

## 2019-02-08 DIAGNOSIS — Z955 Presence of coronary angioplasty implant and graft: Secondary | ICD-10-CM | POA: Insufficient documentation

## 2019-02-08 DIAGNOSIS — E119 Type 2 diabetes mellitus without complications: Secondary | ICD-10-CM | POA: Insufficient documentation

## 2019-02-08 DIAGNOSIS — X030XXA Exposure to flames in controlled fire, not in building or structure, initial encounter: Secondary | ICD-10-CM | POA: Insufficient documentation

## 2019-02-08 DIAGNOSIS — Y939 Activity, unspecified: Secondary | ICD-10-CM | POA: Insufficient documentation

## 2019-02-08 DIAGNOSIS — Z7984 Long term (current) use of oral hypoglycemic drugs: Secondary | ICD-10-CM | POA: Diagnosis not present

## 2019-02-08 DIAGNOSIS — T2122XA Burn of second degree of abdominal wall, initial encounter: Secondary | ICD-10-CM | POA: Diagnosis not present

## 2019-02-08 DIAGNOSIS — T300 Burn of unspecified body region, unspecified degree: Secondary | ICD-10-CM | POA: Diagnosis not present

## 2019-02-08 DIAGNOSIS — Z7982 Long term (current) use of aspirin: Secondary | ICD-10-CM | POA: Diagnosis not present

## 2019-02-08 DIAGNOSIS — T310 Burns involving less than 10% of body surface: Secondary | ICD-10-CM | POA: Insufficient documentation

## 2019-02-08 DIAGNOSIS — T8149XA Infection following a procedure, other surgical site, initial encounter: Secondary | ICD-10-CM | POA: Diagnosis not present

## 2019-02-08 DIAGNOSIS — I252 Old myocardial infarction: Secondary | ICD-10-CM | POA: Diagnosis not present

## 2019-02-08 DIAGNOSIS — S299XXA Unspecified injury of thorax, initial encounter: Secondary | ICD-10-CM | POA: Diagnosis not present

## 2019-02-08 DIAGNOSIS — S3991XA Unspecified injury of abdomen, initial encounter: Secondary | ICD-10-CM | POA: Diagnosis present

## 2019-02-08 DIAGNOSIS — T2102XA Burn of unspecified degree of abdominal wall, initial encounter: Secondary | ICD-10-CM | POA: Diagnosis not present

## 2019-02-08 LAB — CBC WITH DIFFERENTIAL/PLATELET
Abs Immature Granulocytes: 0.05 10*3/uL (ref 0.00–0.07)
BASOS PCT: 0 %
Basophils Absolute: 0 10*3/uL (ref 0.0–0.1)
Eosinophils Absolute: 0.1 10*3/uL (ref 0.0–0.5)
Eosinophils Relative: 1 %
HCT: 37.4 % — ABNORMAL LOW (ref 39.0–52.0)
Hemoglobin: 12.5 g/dL — ABNORMAL LOW (ref 13.0–17.0)
Immature Granulocytes: 1 %
Lymphocytes Relative: 22 %
Lymphs Abs: 2.1 10*3/uL (ref 0.7–4.0)
MCH: 34 pg (ref 26.0–34.0)
MCHC: 33.4 g/dL (ref 30.0–36.0)
MCV: 101.6 fL — ABNORMAL HIGH (ref 80.0–100.0)
Monocytes Absolute: 0.8 10*3/uL (ref 0.1–1.0)
Monocytes Relative: 8 %
Neutro Abs: 6.8 10*3/uL (ref 1.7–7.7)
Neutrophils Relative %: 68 %
PLATELETS: 165 10*3/uL (ref 150–400)
RBC: 3.68 MIL/uL — ABNORMAL LOW (ref 4.22–5.81)
RDW: 13.7 % (ref 11.5–15.5)
WBC: 9.8 10*3/uL (ref 4.0–10.5)
nRBC: 0 % (ref 0.0–0.2)

## 2019-02-08 LAB — URINALYSIS, ROUTINE W REFLEX MICROSCOPIC
Bilirubin Urine: NEGATIVE
GLUCOSE, UA: NEGATIVE mg/dL
HGB URINE DIPSTICK: NEGATIVE
Ketones, ur: NEGATIVE mg/dL
Leukocytes,Ua: NEGATIVE
Nitrite: NEGATIVE
Protein, ur: NEGATIVE mg/dL
Specific Gravity, Urine: 1.006 (ref 1.005–1.030)
pH: 6 (ref 5.0–8.0)

## 2019-02-08 LAB — COMPREHENSIVE METABOLIC PANEL
ALBUMIN: 3.7 g/dL (ref 3.5–5.0)
ALT: 17 U/L (ref 0–44)
AST: 23 U/L (ref 15–41)
Alkaline Phosphatase: 77 U/L (ref 38–126)
Anion gap: 13 (ref 5–15)
BILIRUBIN TOTAL: 0.6 mg/dL (ref 0.3–1.2)
BUN: 13 mg/dL (ref 6–20)
CO2: 22 mmol/L (ref 22–32)
Calcium: 8.8 mg/dL — ABNORMAL LOW (ref 8.9–10.3)
Chloride: 98 mmol/L (ref 98–111)
Creatinine, Ser: 0.58 mg/dL — ABNORMAL LOW (ref 0.61–1.24)
GFR calc Af Amer: >60 mL/min (ref >60)
GFR calc non Af Amer: >60 mL/min (ref >60)
Glucose, Bld: 90 mg/dL (ref 70–99)
Potassium: 3 mmol/L — ABNORMAL LOW (ref 3.5–5.1)
Sodium: 133 mmol/L — ABNORMAL LOW (ref 135–145)
Total Protein: 6.9 g/dL (ref 6.5–8.1)

## 2019-02-08 LAB — LACTIC ACID, PLASMA: Lactic Acid, Venous: 2.6 mmol/L (ref 0.5–1.9)

## 2019-02-08 MED ORDER — VANCOMYCIN HCL 10 G IV SOLR
1500.0000 mg | Freq: Once | INTRAVENOUS | Status: AC
  Administered 2019-02-08: 1500 mg via INTRAVENOUS
  Filled 2019-02-08: qty 1500

## 2019-02-08 MED ORDER — MORPHINE SULFATE (PF) 4 MG/ML IV SOLN
4.0000 mg | Freq: Once | INTRAVENOUS | Status: AC
  Administered 2019-02-08: 4 mg via INTRAVENOUS
  Filled 2019-02-08: qty 1

## 2019-02-08 MED ORDER — POTASSIUM CHLORIDE CRYS ER 20 MEQ PO TBCR
40.0000 meq | EXTENDED_RELEASE_TABLET | Freq: Once | ORAL | Status: AC
  Administered 2019-02-08: 40 meq via ORAL
  Filled 2019-02-08: qty 2

## 2019-02-08 MED ORDER — MORPHINE SULFATE (PF) 2 MG/ML IV SOLN
2.0000 mg | Freq: Once | INTRAVENOUS | Status: AC
  Administered 2019-02-08: 2 mg via INTRAVENOUS
  Filled 2019-02-08: qty 1

## 2019-02-08 MED ORDER — LACTATED RINGERS IV SOLN
INTRAVENOUS | Status: DC
  Administered 2019-02-08: 17:00:00 via INTRAVENOUS

## 2019-02-08 MED ORDER — PIPERACILLIN-TAZOBACTAM 3.375 G IVPB 30 MIN
3.3750 g | Freq: Once | INTRAVENOUS | Status: AC
  Administered 2019-02-08: 3.375 g via INTRAVENOUS
  Filled 2019-02-08: qty 50

## 2019-02-08 MED ORDER — LACTATED RINGERS IV BOLUS (SEPSIS)
1000.0000 mL | Freq: Once | INTRAVENOUS | Status: AC
  Administered 2019-02-08: 1000 mL via INTRAVENOUS
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
## 2019-02-08 NOTE — Progress Notes (Signed)
A consult was received from an ED physician for Vancomycin & Zosyn per pharmacy dosing.  The patient's profile has been reviewed for ht/wt/allergies/indication/available labs.    A one time order has been placed for Zosyn 3.375gm & Vancomycin 1500mg .  Further antibiotics/pharmacy consults should be ordered by admitting physician if indicated.                       Thank you, Warren Salas, Warren Salas 02/08/2019  2:15 PM
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
## 2019-02-08 NOTE — ED Notes (Signed)
Date and time results received: 02/08/19 3:22 PM  (use smartphrase ".now" to insert current time)  Test: Lactic Acid Critical Value: 2.6  Name of Provider Notified: Alyssa PA  Orders Received? Or Actions Taken?: awaiting orders

## 2019-02-08 NOTE — ED Notes (Signed)
Gave report to [facility] on this pt.

## 2019-02-11 LAB — AEROBIC CULTURE W GRAM STAIN (SUPERFICIAL SPECIMEN)

## 2019-02-12 ENCOUNTER — Telehealth: Payer: Self-pay | Admitting: Emergency Medicine

## 2019-02-12 NOTE — Telephone Encounter (Signed)
Culture report faxed to [facility] @ fax #[phone] attn:Joyce

## 2019-02-12 NOTE — Progress Notes (Signed)
ED Antimicrobial Stewardship Positive Culture Follow Up   Warren Salas is an 58 y.o. male who presented to [facility] on 02/08/2019 with a chief complaint of  Chief Complaint  Patient presents with  . Burn    Recent Results (from the past 720 hour(s))  Blood Culture (routine x 2)     Status: None (Preliminary result)   Collection Time: 02/08/19  1:50 PM  Result Value Ref Range Status   Specimen Description   Final    BLOOD LEFT FOREARM Performed at [facility], [address] [place], [place] [address]    Special Requests   Final    BOTTLES DRAWN AEROBIC AND ANAEROBIC Blood Culture adequate volume Performed at [facility], [address], [place], [place] [address]    Culture   Final    NO GROWTH 3 DAYS Performed at [facility], [address], [place], [place] [address]    Report Status PENDING  Incomplete  Blood Culture (routine x 2)     Status: None (Preliminary result)   Collection Time: 02/08/19  1:55 PM  Result Value Ref Range Status   Specimen Description BLOOD RIGHT ANTECUBITAL  Final   Special Requests   Final    BOTTLES DRAWN AEROBIC AND ANAEROBIC Blood Culture adequate volume Performed at [facility], [address], [place], [place] [address]    Culture   Final    NO GROWTH 3 DAYS Performed at [facility], [address] [place], [place] [address]    Report Status PENDING  Incomplete  Wound or Superficial Culture     Status: None   Collection Time: 02/08/19  2:00 PM  Result Value Ref Range Status   Specimen Description   Final    BURN Performed at [facility], [address], [place], [place] [address]    Special Requests   Final    Immunocompromised Performed at [facility], [address], [place], [place] [address]    Gram Stain   Final    ABUNDANT WBC PRESENT, PREDOMINANTLY PMN RARE GRAM POSITIVE COCCI Performed at [facility], [address], [place], [place] [address]    Culture RARE STAPHYLOCOCCUS AUREUS  Final   Report Status 02/11/2019 FINAL  Final   Organism ID, Bacteria STAPHYLOCOCCUS AUREUS  Final      Susceptibility   Staphylococcus aureus - MIC*    CIPROFLOXACIN <=0.5 SENSITIVE Sensitive     ERYTHROMYCIN <=0.25 SENSITIVE Sensitive     GENTAMICIN <=0.5 SENSITIVE Sensitive     OXACILLIN 0.5 SENSITIVE Sensitive     TETRACYCLINE <=1 SENSITIVE Sensitive     VANCOMYCIN <=0.5 SENSITIVE Sensitive     TRIMETH/SULFA <=10 SENSITIVE Sensitive     CLINDAMYCIN <=0.25 SENSITIVE Sensitive     RIFAMPIN <=0.5 SENSITIVE Sensitive     Inducible Clindamycin NEGATIVE Sensitive     * RARE STAPHYLOCOCCUS AUREUS    Fax results to [facility]  ED Provider: Burna Forts PA-C   Armandina Stammer 02/12/2019, 10:47 AM Clinical Pharmacist Monday - Friday phone -  [phone] Saturday - Sunday phone - [phone]

## 2019-02-13 LAB — CULTURE, BLOOD (ROUTINE X 2)
Culture: NO GROWTH
Culture: NO GROWTH
SPECIAL REQUESTS: ADEQUATE
Special Requests: ADEQUATE

## 2019-02-16 DIAGNOSIS — T310 Burns involving less than 10% of body surface: Secondary | ICD-10-CM | POA: Diagnosis not present

## 2019-02-16 DIAGNOSIS — T2121XA Burn of second degree of chest wall, initial encounter: Secondary | ICD-10-CM | POA: Diagnosis not present

## 2019-02-16 DIAGNOSIS — T2132XA Burn of third degree of abdominal wall, initial encounter: Secondary | ICD-10-CM | POA: Diagnosis not present

## 2019-02-16 DIAGNOSIS — T2131XA Burn of third degree of chest wall, initial encounter: Secondary | ICD-10-CM | POA: Diagnosis not present

## 2019-02-16 DIAGNOSIS — T2122XA Burn of second degree of abdominal wall, initial encounter: Secondary | ICD-10-CM | POA: Diagnosis not present

## 2019-02-21 DIAGNOSIS — T2102XA Burn of unspecified degree of abdominal wall, initial encounter: Secondary | ICD-10-CM | POA: Diagnosis not present

## 2019-02-21 DIAGNOSIS — Z794 Long term (current) use of insulin: Secondary | ICD-10-CM | POA: Diagnosis not present

## 2019-02-21 DIAGNOSIS — E1165 Type 2 diabetes mellitus with hyperglycemia: Secondary | ICD-10-CM | POA: Diagnosis not present

## 2019-02-21 DIAGNOSIS — I10 Essential (primary) hypertension: Secondary | ICD-10-CM | POA: Diagnosis not present

## 2019-02-21 DIAGNOSIS — T310 Burns involving less than 10% of body surface: Secondary | ICD-10-CM | POA: Diagnosis not present

## 2019-02-21 DIAGNOSIS — T2101XA Burn of unspecified degree of chest wall, initial encounter: Secondary | ICD-10-CM | POA: Diagnosis not present

## 2019-02-21 DIAGNOSIS — R000 Tachycardia, unspecified: Secondary | ICD-10-CM | POA: Diagnosis not present

## 2019-02-21 DIAGNOSIS — L089 Local infection of the skin and subcutaneous tissue, unspecified: Secondary | ICD-10-CM | POA: Diagnosis not present

## 2019-02-22 DIAGNOSIS — E1165 Type 2 diabetes mellitus with hyperglycemia: Secondary | ICD-10-CM | POA: Diagnosis not present

## 2019-02-22 DIAGNOSIS — T310 Burns involving less than 10% of body surface: Secondary | ICD-10-CM | POA: Diagnosis not present

## 2019-02-22 DIAGNOSIS — I10 Essential (primary) hypertension: Secondary | ICD-10-CM | POA: Diagnosis not present

## 2019-02-22 DIAGNOSIS — R000 Tachycardia, unspecified: Secondary | ICD-10-CM | POA: Diagnosis not present

## 2019-02-22 DIAGNOSIS — T2101XA Burn of unspecified degree of chest wall, initial encounter: Secondary | ICD-10-CM | POA: Diagnosis not present

## 2019-02-22 DIAGNOSIS — L089 Local infection of the skin and subcutaneous tissue, unspecified: Secondary | ICD-10-CM | POA: Diagnosis not present

## 2019-02-22 DIAGNOSIS — T2102XA Burn of unspecified degree of abdominal wall, initial encounter: Secondary | ICD-10-CM | POA: Diagnosis not present

## 2019-02-22 DIAGNOSIS — Z794 Long term (current) use of insulin: Secondary | ICD-10-CM | POA: Diagnosis not present

## 2019-03-03 ENCOUNTER — Other Ambulatory Visit: Payer: Self-pay

## 2019-03-03 ENCOUNTER — Encounter (HOSPITAL_COMMUNITY): Payer: Self-pay | Admitting: Emergency Medicine

## 2019-03-03 ENCOUNTER — Emergency Department (HOSPITAL_COMMUNITY)
Admission: EM | Admit: 2019-03-03 | Discharge: 2019-03-03 | Disposition: A | Discharge status: Home / Self Care | Payer: Medicare Other | Attending: Emergency Medicine | Admitting: Emergency Medicine

## 2019-03-03 DIAGNOSIS — Z590 Homelessness: Secondary | ICD-10-CM | POA: Diagnosis not present

## 2019-03-03 DIAGNOSIS — Z5189 Encounter for other specified aftercare: Secondary | ICD-10-CM | POA: Insufficient documentation

## 2019-03-03 DIAGNOSIS — I10 Essential (primary) hypertension: Secondary | ICD-10-CM | POA: Diagnosis not present

## 2019-03-03 DIAGNOSIS — I2510 Atherosclerotic heart disease of native coronary artery without angina pectoris: Secondary | ICD-10-CM | POA: Insufficient documentation

## 2019-03-03 DIAGNOSIS — Z7982 Long term (current) use of aspirin: Secondary | ICD-10-CM | POA: Diagnosis not present

## 2019-03-03 DIAGNOSIS — Z7984 Long term (current) use of oral hypoglycemic drugs: Secondary | ICD-10-CM | POA: Insufficient documentation

## 2019-03-03 DIAGNOSIS — F17210 Nicotine dependence, cigarettes, uncomplicated: Secondary | ICD-10-CM | POA: Diagnosis not present

## 2019-03-03 DIAGNOSIS — Z79899 Other long term (current) drug therapy: Secondary | ICD-10-CM | POA: Insufficient documentation

## 2019-03-03 DIAGNOSIS — T310 Burns involving less than 10% of body surface: Secondary | ICD-10-CM | POA: Diagnosis not present

## 2019-03-03 DIAGNOSIS — E119 Type 2 diabetes mellitus without complications: Secondary | ICD-10-CM | POA: Diagnosis not present

## 2019-03-03 DIAGNOSIS — F1490 Cocaine use, unspecified, uncomplicated: Secondary | ICD-10-CM | POA: Diagnosis not present

## 2019-03-03 DIAGNOSIS — I252 Old myocardial infarction: Secondary | ICD-10-CM | POA: Diagnosis not present

## 2019-03-03 DIAGNOSIS — T2122XD Burn of second degree of abdominal wall, subsequent encounter: Secondary | ICD-10-CM | POA: Diagnosis not present

## 2019-03-03 DIAGNOSIS — J449 Chronic obstructive pulmonary disease, unspecified: Secondary | ICD-10-CM | POA: Insufficient documentation

## 2019-03-03 MED ORDER — KETOROLAC TROMETHAMINE 60 MG/2ML IM SOLN
60.0000 mg | Freq: Once | INTRAMUSCULAR | Status: AC
  Administered 2019-03-03: 60 mg via INTRAMUSCULAR
  Filled 2019-03-03: qty 2

## 2019-03-03 MED ORDER — SILVER SULFADIAZINE 1 % EX CREA: 1.0000 "application " | TOPICAL_CREAM | Freq: Every day | CUTANEOUS | 0 refills | Status: DC

## 2019-03-03 MED ORDER — SILVER SULFADIAZINE 1 % EX CREA
TOPICAL_CREAM | Freq: Once | CUTANEOUS | Status: AC
  Administered 2019-03-03: 03:00:00 via TOPICAL
  Filled 2019-03-03: qty 85
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
## 2019-03-21 ENCOUNTER — Emergency Department (HOSPITAL_COMMUNITY)
Admission: EM | Admit: 2019-03-21 | Discharge: 2019-03-21 | Disposition: A | Discharge status: Home / Self Care | Payer: Medicare Other | Attending: Emergency Medicine | Admitting: Emergency Medicine

## 2019-03-21 ENCOUNTER — Encounter (HOSPITAL_COMMUNITY): Payer: Self-pay | Admitting: *Deleted

## 2019-03-21 ENCOUNTER — Other Ambulatory Visit: Payer: Self-pay

## 2019-03-21 DIAGNOSIS — F17210 Nicotine dependence, cigarettes, uncomplicated: Secondary | ICD-10-CM | POA: Diagnosis not present

## 2019-03-21 DIAGNOSIS — Z7982 Long term (current) use of aspirin: Secondary | ICD-10-CM | POA: Insufficient documentation

## 2019-03-21 DIAGNOSIS — Z590 Homelessness: Secondary | ICD-10-CM | POA: Diagnosis not present

## 2019-03-21 DIAGNOSIS — Z760 Encounter for issue of repeat prescription: Secondary | ICD-10-CM | POA: Insufficient documentation

## 2019-03-21 DIAGNOSIS — E119 Type 2 diabetes mellitus without complications: Secondary | ICD-10-CM | POA: Insufficient documentation

## 2019-03-21 DIAGNOSIS — X030XXD Exposure to flames in controlled fire, not in building or structure, subsequent encounter: Secondary | ICD-10-CM | POA: Diagnosis not present

## 2019-03-21 DIAGNOSIS — Z5189 Encounter for other specified aftercare: Secondary | ICD-10-CM

## 2019-03-21 DIAGNOSIS — J449 Chronic obstructive pulmonary disease, unspecified: Secondary | ICD-10-CM | POA: Insufficient documentation

## 2019-03-21 DIAGNOSIS — I10 Essential (primary) hypertension: Secondary | ICD-10-CM | POA: Insufficient documentation

## 2019-03-21 DIAGNOSIS — I2510 Atherosclerotic heart disease of native coronary artery without angina pectoris: Secondary | ICD-10-CM | POA: Insufficient documentation

## 2019-03-21 DIAGNOSIS — T2101XA Burn of unspecified degree of chest wall, initial encounter: Secondary | ICD-10-CM | POA: Diagnosis not present

## 2019-03-21 DIAGNOSIS — Z79899 Other long term (current) drug therapy: Secondary | ICD-10-CM | POA: Diagnosis not present

## 2019-03-21 DIAGNOSIS — T2101XD Burn of unspecified degree of chest wall, subsequent encounter: Secondary | ICD-10-CM | POA: Insufficient documentation

## 2019-03-21 DIAGNOSIS — Z7984 Long term (current) use of oral hypoglycemic drugs: Secondary | ICD-10-CM | POA: Diagnosis not present

## 2019-03-21 MED ORDER — KETOROLAC TROMETHAMINE 30 MG/ML IJ SOLN
30.0000 mg | Freq: Once | INTRAMUSCULAR | Status: AC
  Administered 2019-03-21: 30 mg via INTRAMUSCULAR
  Filled 2019-03-21: qty 1

## 2019-03-21 MED ORDER — SILVER SULFADIAZINE 1 % EX CREA
TOPICAL_CREAM | Freq: Once | CUTANEOUS | Status: AC
  Administered 2019-03-21: 04:00:00 via TOPICAL
  Filled 2019-03-21: qty 50
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
## 2019-03-26 ENCOUNTER — Encounter (HOSPITAL_COMMUNITY): Payer: Self-pay

## 2019-03-26 ENCOUNTER — Observation Stay (HOSPITAL_COMMUNITY)
Admission: EM | Admit: 2019-03-26 | Discharge: 2019-03-27 | Disposition: A | Discharge status: Home / Self Care | Payer: Medicare Other | Attending: Internal Medicine | Admitting: Internal Medicine

## 2019-03-26 ENCOUNTER — Emergency Department (HOSPITAL_COMMUNITY): Payer: Medicare Other

## 2019-03-26 DIAGNOSIS — Z20828 Contact with and (suspected) exposure to other viral communicable diseases: Secondary | ICD-10-CM | POA: Insufficient documentation

## 2019-03-26 DIAGNOSIS — R6889 Other general symptoms and signs: Secondary | ICD-10-CM

## 2019-03-26 DIAGNOSIS — R1084 Generalized abdominal pain: Secondary | ICD-10-CM | POA: Diagnosis not present

## 2019-03-26 DIAGNOSIS — J449 Chronic obstructive pulmonary disease, unspecified: Secondary | ICD-10-CM | POA: Diagnosis not present

## 2019-03-26 DIAGNOSIS — F1010 Alcohol abuse, uncomplicated: Secondary | ICD-10-CM | POA: Diagnosis not present

## 2019-03-26 DIAGNOSIS — I252 Old myocardial infarction: Secondary | ICD-10-CM | POA: Insufficient documentation

## 2019-03-26 DIAGNOSIS — Z7982 Long term (current) use of aspirin: Secondary | ICD-10-CM | POA: Diagnosis not present

## 2019-03-26 DIAGNOSIS — Z79899 Other long term (current) drug therapy: Secondary | ICD-10-CM | POA: Insufficient documentation

## 2019-03-26 DIAGNOSIS — R000 Tachycardia, unspecified: Secondary | ICD-10-CM

## 2019-03-26 DIAGNOSIS — R52 Pain, unspecified: Secondary | ICD-10-CM | POA: Diagnosis not present

## 2019-03-26 DIAGNOSIS — R059 Cough, unspecified: Secondary | ICD-10-CM

## 2019-03-26 DIAGNOSIS — Z590 Homelessness: Secondary | ICD-10-CM | POA: Insufficient documentation

## 2019-03-26 DIAGNOSIS — R1110 Vomiting, unspecified: Secondary | ICD-10-CM

## 2019-03-26 DIAGNOSIS — E118 Type 2 diabetes mellitus with unspecified complications: Principal | ICD-10-CM | POA: Insufficient documentation

## 2019-03-26 DIAGNOSIS — R05 Cough: Secondary | ICD-10-CM

## 2019-03-26 DIAGNOSIS — T300 Burn of unspecified body region, unspecified degree: Secondary | ICD-10-CM | POA: Diagnosis not present

## 2019-03-26 DIAGNOSIS — R509 Fever, unspecified: Secondary | ICD-10-CM

## 2019-03-26 DIAGNOSIS — R109 Unspecified abdominal pain: Secondary | ICD-10-CM | POA: Diagnosis not present

## 2019-03-26 DIAGNOSIS — I10 Essential (primary) hypertension: Secondary | ICD-10-CM | POA: Insufficient documentation

## 2019-03-26 DIAGNOSIS — Z7984 Long term (current) use of oral hypoglycemic drugs: Secondary | ICD-10-CM | POA: Diagnosis not present

## 2019-03-26 DIAGNOSIS — R0602 Shortness of breath: Secondary | ICD-10-CM | POA: Insufficient documentation

## 2019-03-26 DIAGNOSIS — Z955 Presence of coronary angioplasty implant and graft: Secondary | ICD-10-CM | POA: Insufficient documentation

## 2019-03-26 DIAGNOSIS — I2510 Atherosclerotic heart disease of native coronary artery without angina pectoris: Secondary | ICD-10-CM | POA: Diagnosis not present

## 2019-03-26 DIAGNOSIS — T2101XA Burn of unspecified degree of chest wall, initial encounter: Secondary | ICD-10-CM | POA: Diagnosis present

## 2019-03-26 DIAGNOSIS — R197 Diarrhea, unspecified: Secondary | ICD-10-CM

## 2019-03-26 DIAGNOSIS — F1024 Alcohol dependence with alcohol-induced mood disorder: Secondary | ICD-10-CM | POA: Diagnosis present

## 2019-03-26 DIAGNOSIS — Z20822 Contact with and (suspected) exposure to covid-19: Secondary | ICD-10-CM | POA: Diagnosis present

## 2019-03-26 DIAGNOSIS — F17210 Nicotine dependence, cigarettes, uncomplicated: Secondary | ICD-10-CM | POA: Insufficient documentation

## 2019-03-26 LAB — CBC WITH DIFFERENTIAL/PLATELET
Abs Immature Granulocytes: 0.01 10*3/uL (ref 0.00–0.07)
Basophils Absolute: 0.1 10*3/uL (ref 0.0–0.1)
Basophils Relative: 1 %
Eosinophils Absolute: 0.1 10*3/uL (ref 0.0–0.5)
Eosinophils Relative: 2 %
HCT: 42.8 % (ref 39.0–52.0)
HEMOGLOBIN: 14.1 g/dL (ref 13.0–17.0)
Immature Granulocytes: 0 %
Lymphocytes Relative: 48 %
Lymphs Abs: 3 10*3/uL (ref 0.7–4.0)
MCH: 31.2 pg (ref 26.0–34.0)
MCHC: 32.9 g/dL (ref 30.0–36.0)
MCV: 94.7 fL (ref 80.0–100.0)
MONO ABS: 0.6 10*3/uL (ref 0.1–1.0)
Monocytes Relative: 9 %
Neutro Abs: 2.5 10*3/uL (ref 1.7–7.7)
Neutrophils Relative %: 40 %
Platelets: 242 10*3/uL (ref 150–400)
RBC: 4.52 MIL/uL (ref 4.22–5.81)
RDW: 14.1 % (ref 11.5–15.5)
WBC: 6.3 10*3/uL (ref 4.0–10.5)
nRBC: 0 % (ref 0.0–0.2)

## 2019-03-26 MED ORDER — SODIUM CHLORIDE 0.9 % IV BOLUS
1000.0000 mL | Freq: Once | INTRAVENOUS | Status: AC
  Administered 2019-03-27: 1000 mL via INTRAVENOUS
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
## 2019-03-26 NOTE — ED Notes (Signed)
Patient transported to CT 
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
## 2019-03-26 NOTE — ED Notes (Signed)
Bed: WLPT4 Expected date:  Expected time:  Means of arrival:  Comments: 

## 2019-03-27 ENCOUNTER — Encounter (HOSPITAL_COMMUNITY): Payer: Self-pay

## 2019-03-27 ENCOUNTER — Other Ambulatory Visit: Payer: Self-pay

## 2019-03-27 ENCOUNTER — Emergency Department (HOSPITAL_COMMUNITY): Payer: Medicare Other

## 2019-03-27 DIAGNOSIS — R509 Fever, unspecified: Secondary | ICD-10-CM

## 2019-03-27 DIAGNOSIS — R05 Cough: Secondary | ICD-10-CM | POA: Diagnosis not present

## 2019-03-27 DIAGNOSIS — R109 Unspecified abdominal pain: Secondary | ICD-10-CM | POA: Diagnosis not present

## 2019-03-27 DIAGNOSIS — R1084 Generalized abdominal pain: Secondary | ICD-10-CM | POA: Diagnosis not present

## 2019-03-27 DIAGNOSIS — R6889 Other general symptoms and signs: Secondary | ICD-10-CM | POA: Diagnosis not present

## 2019-03-27 DIAGNOSIS — R197 Diarrhea, unspecified: Secondary | ICD-10-CM

## 2019-03-27 DIAGNOSIS — T2101XA Burn of unspecified degree of chest wall, initial encounter: Secondary | ICD-10-CM | POA: Diagnosis present

## 2019-03-27 DIAGNOSIS — T2101XD Burn of unspecified degree of chest wall, subsequent encounter: Secondary | ICD-10-CM | POA: Diagnosis not present

## 2019-03-27 DIAGNOSIS — E118 Type 2 diabetes mellitus with unspecified complications: Secondary | ICD-10-CM

## 2019-03-27 DIAGNOSIS — F1024 Alcohol dependence with alcohol-induced mood disorder: Secondary | ICD-10-CM | POA: Diagnosis not present

## 2019-03-27 DIAGNOSIS — Z20822 Contact with and (suspected) exposure to covid-19: Secondary | ICD-10-CM | POA: Diagnosis present

## 2019-03-27 DIAGNOSIS — R000 Tachycardia, unspecified: Secondary | ICD-10-CM

## 2019-03-27 DIAGNOSIS — R1110 Vomiting, unspecified: Secondary | ICD-10-CM

## 2019-03-27 LAB — COMPREHENSIVE METABOLIC PANEL
ALT: 28 U/L (ref 0–44)
AST: 41 U/L (ref 15–41)
Albumin: 4 g/dL (ref 3.5–5.0)
Alkaline Phosphatase: 95 U/L (ref 38–126)
Anion gap: 12 (ref 5–15)
BUN: 8 mg/dL (ref 6–20)
CO2: 20 mmol/L — AB (ref 22–32)
Calcium: 8.5 mg/dL — ABNORMAL LOW (ref 8.9–10.3)
Chloride: 108 mmol/L (ref 98–111)
Creatinine, Ser: 0.74 mg/dL (ref 0.61–1.24)
GFR calc Af Amer: >60 mL/min (ref >60)
GFR calc non Af Amer: >60 mL/min (ref >60)
Glucose, Bld: 127 mg/dL — ABNORMAL HIGH (ref 70–99)
Potassium: 4.1 mmol/L (ref 3.5–5.1)
Sodium: 140 mmol/L (ref 135–145)
Total Bilirubin: 0.2 mg/dL — ABNORMAL LOW (ref 0.3–1.2)
Total Protein: 7.7 g/dL (ref 6.5–8.1)

## 2019-03-27 LAB — URINALYSIS, ROUTINE W REFLEX MICROSCOPIC
Bilirubin Urine: NEGATIVE
Glucose, UA: NEGATIVE mg/dL
Hgb urine dipstick: NEGATIVE
Ketones, ur: NEGATIVE mg/dL
Leukocytes,Ua: NEGATIVE
NITRITE: NEGATIVE
Protein, ur: NEGATIVE mg/dL
Specific Gravity, Urine: 1.011 (ref 1.005–1.030)
pH: 5 (ref 5.0–8.0)

## 2019-03-27 LAB — RESPIRATORY PANEL BY PCR
Adenovirus: NOT DETECTED
Bordetella pertussis: NOT DETECTED
Chlamydophila pneumoniae: NOT DETECTED
Coronavirus 229E: NOT DETECTED
Coronavirus HKU1: NOT DETECTED
Coronavirus NL63: NOT DETECTED
Coronavirus OC43: NOT DETECTED
INFLUENZA B-RVPPCR: NOT DETECTED
Influenza A: NOT DETECTED
Metapneumovirus: NOT DETECTED
Mycoplasma pneumoniae: NOT DETECTED
Parainfluenza Virus 1: NOT DETECTED
Parainfluenza Virus 2: NOT DETECTED
Parainfluenza Virus 3: NOT DETECTED
Parainfluenza Virus 4: NOT DETECTED
Respiratory Syncytial Virus: NOT DETECTED
Rhinovirus / Enterovirus: NOT DETECTED

## 2019-03-27 LAB — RAPID URINE DRUG SCREEN, HOSP PERFORMED
AMPHETAMINES: NOT DETECTED
BENZODIAZEPINES: NOT DETECTED
Barbiturates: NOT DETECTED
COCAINE: NOT DETECTED
Opiates: NOT DETECTED
Tetrahydrocannabinol: NOT DETECTED

## 2019-03-27 LAB — GLUCOSE, CAPILLARY
Glucose-Capillary: 90 mg/dL (ref 70–99)
Glucose-Capillary: 103 mg/dL — ABNORMAL HIGH (ref 70–99)

## 2019-03-27 LAB — INFLUENZA PANEL BY PCR (TYPE A & B)
Influenza A By PCR: NEGATIVE
Influenza B By PCR: NEGATIVE

## 2019-03-27 LAB — LACTIC ACID, PLASMA
Lactic Acid, Venous: 2.6 mmol/L (ref 0.5–1.9)
Lactic Acid, Venous: 2.7 mmol/L (ref 0.5–1.9)

## 2019-03-27 LAB — ETHANOL: Alcohol, Ethyl (B): 266 mg/dL — ABNORMAL HIGH (ref <10)

## 2019-03-27 LAB — LIPASE, BLOOD: Lipase: 63 U/L — ABNORMAL HIGH (ref 11–51)

## 2019-03-27 MED ORDER — SODIUM CHLORIDE 0.9 % IV BOLUS
500.0000 mL | Freq: Once | INTRAVENOUS | Status: AC
  Administered 2019-03-27: 500 mL via INTRAVENOUS

## 2019-03-27 MED ORDER — THIAMINE HCL 100 MG/ML IJ SOLN
100.0000 mg | Freq: Every day | INTRAMUSCULAR | Status: DC
  Administered 2019-03-27: 100 mg via INTRAVENOUS
  Filled 2019-03-27: qty 2

## 2019-03-27 MED ORDER — VITAMIN B-1 100 MG PO TABS
100.0000 mg | ORAL_TABLET | Freq: Every day | ORAL | Status: DC
  Administered 2019-03-27: 100 mg via ORAL
  Filled 2019-03-27: qty 1

## 2019-03-27 MED ORDER — ADULT MULTIVITAMIN W/MINERALS CH
1.0000 | ORAL_TABLET | Freq: Every day | ORAL | Status: DC
  Administered 2019-03-27: 1 via ORAL
  Filled 2019-03-27: qty 1

## 2019-03-27 MED ORDER — ENOXAPARIN SODIUM 40 MG/0.4ML ~~LOC~~ SOLN
40.0000 mg | SUBCUTANEOUS | Status: DC
  Filled 2019-03-27: qty 0.4

## 2019-03-27 MED ORDER — SODIUM CHLORIDE (PF) 0.9 % IJ SOLN
INTRAMUSCULAR | Status: AC
  Filled 2019-03-27: qty 50

## 2019-03-27 MED ORDER — LORAZEPAM 1 MG PO TABS: 1.0000 mg | ORAL_TABLET | Freq: Four times a day (QID) | ORAL | Status: DC | PRN

## 2019-03-27 MED ORDER — MORPHINE SULFATE (PF) 4 MG/ML IV SOLN
4.0000 mg | Freq: Once | INTRAVENOUS | Status: AC
  Administered 2019-03-27: 4 mg via INTRAVENOUS
  Filled 2019-03-27: qty 1

## 2019-03-27 MED ORDER — METRONIDAZOLE IN NACL 5-0.79 MG/ML-% IV SOLN
500.0000 mg | Freq: Once | INTRAVENOUS | Status: AC
  Administered 2019-03-27: 500 mg via INTRAVENOUS
  Filled 2019-03-27: qty 100

## 2019-03-27 MED ORDER — CEPHALEXIN 500 MG PO CAPS: 500.0000 mg | ORAL_CAPSULE | Freq: Two times a day (BID) | ORAL | 0 refills | Status: AC

## 2019-03-27 MED ORDER — CEFAZOLIN SODIUM-DEXTROSE 1-4 GM/50ML-% IV SOLN
1.0000 g | Freq: Three times a day (TID) | INTRAVENOUS | Status: DC
  Administered 2019-03-27: 1 g via INTRAVENOUS
  Filled 2019-03-27 (×2): qty 50

## 2019-03-27 MED ORDER — IOHEXOL 300 MG/ML  SOLN
100.0000 mL | Freq: Once | INTRAMUSCULAR | Status: AC | PRN
  Administered 2019-03-27: 100 mL via INTRAVENOUS

## 2019-03-27 MED ORDER — VANCOMYCIN HCL 10 G IV SOLR
1500.0000 mg | Freq: Once | INTRAVENOUS | Status: AC
  Administered 2019-03-27: 1500 mg via INTRAVENOUS
  Filled 2019-03-27: qty 1500

## 2019-03-27 MED ORDER — INSULIN ASPART 100 UNIT/ML ~~LOC~~ SOLN: 0.0000 [IU] | Freq: Three times a day (TID) | SUBCUTANEOUS | Status: DC

## 2019-03-27 MED ORDER — ASPIRIN EC 81 MG PO TBEC
81.0000 mg | DELAYED_RELEASE_TABLET | Freq: Every day | ORAL | Status: DC
  Administered 2019-03-27: 81 mg via ORAL
  Filled 2019-03-27: qty 1

## 2019-03-27 MED ORDER — ALBUTEROL SULFATE HFA 108 (90 BASE) MCG/ACT IN AERS: 1.0000 | INHALATION_SPRAY | Freq: Four times a day (QID) | RESPIRATORY_TRACT | 0 refills | Status: DC | PRN

## 2019-03-27 MED ORDER — SODIUM CHLORIDE 0.9 % IV SOLN
2.0000 g | Freq: Once | INTRAVENOUS | Status: AC
  Administered 2019-03-27: 2 g via INTRAVENOUS
  Filled 2019-03-27 (×3): qty 2

## 2019-03-27 MED ORDER — ACETAMINOPHEN 500 MG PO TABS
1000.0000 mg | ORAL_TABLET | Freq: Once | ORAL | Status: AC
  Administered 2019-03-27: 1000 mg via ORAL
  Filled 2019-03-27: qty 2

## 2019-03-27 MED ORDER — FOLIC ACID 1 MG PO TABS
1.0000 mg | ORAL_TABLET | Freq: Every day | ORAL | Status: DC
  Administered 2019-03-27: 1 mg via ORAL
  Filled 2019-03-27: qty 1

## 2019-03-27 MED ORDER — TICAGRELOR 90 MG PO TABS
90.0000 mg | ORAL_TABLET | Freq: Two times a day (BID) | ORAL | Status: DC
  Administered 2019-03-27: 90 mg via ORAL
  Filled 2019-03-27: qty 1

## 2019-03-27 MED ORDER — ONDANSETRON HCL 4 MG PO TABS: 4.0000 mg | ORAL_TABLET | Freq: Four times a day (QID) | ORAL | Status: DC | PRN

## 2019-03-27 MED ORDER — IPRATROPIUM BROMIDE HFA 17 MCG/ACT IN AERS
2.0000 | INHALATION_SPRAY | Freq: Four times a day (QID) | RESPIRATORY_TRACT | Status: DC
  Administered 2019-03-27: 2 via RESPIRATORY_TRACT
  Filled 2019-03-27: qty 12.9

## 2019-03-27 MED ORDER — ACETAMINOPHEN 325 MG PO TABS: 650.0000 mg | ORAL_TABLET | Freq: Four times a day (QID) | ORAL | Status: DC | PRN

## 2019-03-27 MED ORDER — ALBUTEROL SULFATE HFA 108 (90 BASE) MCG/ACT IN AERS: 2.0000 | INHALATION_SPRAY | RESPIRATORY_TRACT | Status: DC | PRN

## 2019-03-27 MED ORDER — NICOTINE 14 MG/24HR TD PT24
14.0000 mg | MEDICATED_PATCH | Freq: Every day | TRANSDERMAL | Status: DC
  Administered 2019-03-27: 14 mg via TRANSDERMAL
  Filled 2019-03-27: qty 1

## 2019-03-27 MED ORDER — LORAZEPAM 2 MG/ML IJ SOLN: 1.0000 mg | Freq: Four times a day (QID) | INTRAMUSCULAR | Status: DC | PRN

## 2019-03-27 MED ORDER — VANCOMYCIN HCL IN DEXTROSE 1-5 GM/200ML-% IV SOLN: 1000.0000 mg | Freq: Once | INTRAVENOUS | Status: DC

## 2019-03-27 MED ORDER — SODIUM CHLORIDE 0.9 % IV SOLN: 1.0000 g | Freq: Three times a day (TID) | INTRAVENOUS | Status: DC

## 2019-03-27 MED ORDER — SODIUM CHLORIDE 0.9 % IV SOLN
1000.0000 mL | INTRAVENOUS | Status: DC
  Administered 2019-03-27: 1000 mL via INTRAVENOUS

## 2019-03-27 MED ORDER — ONDANSETRON HCL 4 MG/2ML IJ SOLN: 4.0000 mg | Freq: Four times a day (QID) | INTRAMUSCULAR | Status: DC | PRN
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
## 2019-03-27 NOTE — Progress Notes (Signed)
A consult was received from an ED physician for cefepime and vancomycin per pharmacy dosing.  The patient's profile has been reviewed for ht/wt/allergies/indication/available labs.   A one time order has been placed for Cefepime 2 gm and Vancomycin 1500 mg.  Further antibiotics/pharmacy consults should be ordered by admitting physician if indicated.                       Thank you, Lorenza Evangelist 03/27/2019  4:06 AM

## 2019-03-27 NOTE — Progress Notes (Signed)
Spoke with pt concerning his medications and being homeless.  Pt states that he go to [facility] and forgot that he can get his medications there, also will help him fine a place to live.  Will give him a list of Homeless Shelters. Explained Obs letter to pt.

## 2019-03-27 NOTE — Care Management Obs Status (Signed)
MEDICARE OBSERVATION STATUS NOTIFICATION   Patient Details  Name: Warren Salas MRN: [id number] Date of Birth: December 10, 1961   Medicare Observation Status Notification Given:  Yes    Geni Bers, RN 03/27/2019, 12:09 PM
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
## 2019-03-27 NOTE — ED Notes (Signed)
Pt tolerating fluids and a sandwich with no difficulty.

## 2019-03-27 NOTE — ED Notes (Signed)
Date and time results received: 03/27/19 0102  (use smartphrase ".now" to insert current time)  Test: Lactic Acid Critical Value: 2.6  Name of Provider Notified: Cyndia Skeeters, PA  Orders Received? Or Actions Taken?: Actions Taken: Provider Notified

## 2019-03-27 NOTE — ED Notes (Addendum)
COVID precautions not taken prior to 0630 orders on 03/27/19. COVID precautions now in place.

## 2019-03-27 NOTE — Progress Notes (Signed)
Spoke with AD Shon Baton concerning pt stating that he was homeless and no lab results back for COVID19.  Pt is wanting to go. A call was made to Legal Ms Cox who states it is okay for pt to discharge.

## 2019-03-27 NOTE — ED Notes (Signed)
Date and time results received: 03/27/19 2:22 AM  (use smartphrase ".now" to insert current time)  Test: Lactic Acid Critical Value: 2.7  Name of Provider Notified: Dahlia Client, [place]  Orders Received? Or Actions Taken?:Blood cultures and CT abdomen

## 2019-03-27 NOTE — ED Notes (Addendum)
COVID precautions initiated at 6:30 AM 03/27/19. COVID precautions were not in place prior to this time.

## 2019-03-27 NOTE — TOC Benefit Eligibility Note (Signed)
Transition of Care [facility]) Benefit Eligibility Note    Patient Details  Name: Warren Salas MRN: [id number] Date of Birth: [birth year]               Spoke with Person/Company/Phone Number:: Ms Alphia Moh [place]. [place] [phone](she sates Patient no longer has Medicaid and needs to file application to refile)                Caren Macadam Phone Number: 03/27/2019, 12:37 PM

## 2019-03-27 NOTE — ED Notes (Signed)
Patient transported to CT 
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
## 2019-03-27 NOTE — ED Notes (Signed)
Pt transported to CT ?

## 2019-03-28 ENCOUNTER — Telehealth: Payer: Self-pay | Admitting: Family Medicine

## 2019-03-28 ENCOUNTER — Emergency Department (HOSPITAL_COMMUNITY)
Admission: EM | Admit: 2019-03-28 | Discharge: 2019-03-28 | Disposition: A | Discharge status: Home / Self Care | Payer: Medicare Other | Attending: Emergency Medicine | Admitting: Emergency Medicine

## 2019-03-28 ENCOUNTER — Telehealth: Payer: Self-pay

## 2019-03-28 ENCOUNTER — Other Ambulatory Visit: Payer: Self-pay

## 2019-03-28 ENCOUNTER — Emergency Department (HOSPITAL_COMMUNITY): Payer: Medicare Other

## 2019-03-28 ENCOUNTER — Encounter (HOSPITAL_COMMUNITY): Payer: Self-pay | Admitting: Emergency Medicine

## 2019-03-28 DIAGNOSIS — F17210 Nicotine dependence, cigarettes, uncomplicated: Secondary | ICD-10-CM | POA: Insufficient documentation

## 2019-03-28 DIAGNOSIS — E119 Type 2 diabetes mellitus without complications: Secondary | ICD-10-CM | POA: Insufficient documentation

## 2019-03-28 DIAGNOSIS — I252 Old myocardial infarction: Secondary | ICD-10-CM | POA: Insufficient documentation

## 2019-03-28 DIAGNOSIS — I10 Essential (primary) hypertension: Secondary | ICD-10-CM | POA: Insufficient documentation

## 2019-03-28 DIAGNOSIS — F1410 Cocaine abuse, uncomplicated: Secondary | ICD-10-CM | POA: Diagnosis not present

## 2019-03-28 DIAGNOSIS — J449 Chronic obstructive pulmonary disease, unspecified: Secondary | ICD-10-CM | POA: Insufficient documentation

## 2019-03-28 DIAGNOSIS — R42 Dizziness and giddiness: Secondary | ICD-10-CM | POA: Insufficient documentation

## 2019-03-28 LAB — BLOOD CULTURE ID PANEL (REFLEXED)
Acinetobacter baumannii: NOT DETECTED
CANDIDA GLABRATA: NOT DETECTED
Candida albicans: NOT DETECTED
Candida krusei: NOT DETECTED
Candida parapsilosis: NOT DETECTED
Candida tropicalis: NOT DETECTED
Enterobacter cloacae complex: NOT DETECTED
Enterobacteriaceae species: NOT DETECTED
Enterococcus species: NOT DETECTED
Escherichia coli: NOT DETECTED
Haemophilus influenzae: NOT DETECTED
KLEBSIELLA OXYTOCA: NOT DETECTED
Klebsiella pneumoniae: NOT DETECTED
Listeria monocytogenes: NOT DETECTED
Methicillin resistance: DETECTED — AB
Neisseria meningitidis: NOT DETECTED
Proteus species: NOT DETECTED
Pseudomonas aeruginosa: NOT DETECTED
Serratia marcescens: NOT DETECTED
Staphylococcus aureus (BCID): NOT DETECTED
Staphylococcus species: DETECTED — AB
Streptococcus agalactiae: NOT DETECTED
Streptococcus pneumoniae: NOT DETECTED
Streptococcus pyogenes: NOT DETECTED
Streptococcus species: NOT DETECTED

## 2019-03-28 LAB — COMPREHENSIVE METABOLIC PANEL
ALT: 35 U/L (ref 0–44)
AST: 48 U/L — ABNORMAL HIGH (ref 15–41)
Albumin: 4.1 g/dL (ref 3.5–5.0)
Alkaline Phosphatase: 102 U/L (ref 38–126)
Anion gap: 12 (ref 5–15)
BUN: 6 mg/dL (ref 6–20)
CO2: 22 mmol/L (ref 22–32)
Calcium: 8.9 mg/dL (ref 8.9–10.3)
Chloride: 103 mmol/L (ref 98–111)
Creatinine, Ser: 0.58 mg/dL — ABNORMAL LOW (ref 0.61–1.24)
GFR calc Af Amer: >60 mL/min (ref >60)
GFR calc non Af Amer: >60 mL/min (ref >60)
Glucose, Bld: 100 mg/dL — ABNORMAL HIGH (ref 70–99)
Potassium: 3.4 mmol/L — ABNORMAL LOW (ref 3.5–5.1)
Sodium: 137 mmol/L (ref 135–145)
Total Bilirubin: 0.4 mg/dL (ref 0.3–1.2)
Total Protein: 8 g/dL (ref 6.5–8.1)

## 2019-03-28 LAB — CBC WITH DIFFERENTIAL/PLATELET
Abs Immature Granulocytes: 0.02 10*3/uL (ref 0.00–0.07)
Basophils Absolute: 0 10*3/uL (ref 0.0–0.1)
Basophils Relative: 1 %
Eosinophils Absolute: 0.2 10*3/uL (ref 0.0–0.5)
Eosinophils Relative: 2 %
HCT: 45.5 % (ref 39.0–52.0)
Hemoglobin: 14.5 g/dL (ref 13.0–17.0)
Immature Granulocytes: 0 %
Lymphocytes Relative: 48 %
Lymphs Abs: 3 10*3/uL (ref 0.7–4.0)
MCH: 30.5 pg (ref 26.0–34.0)
MCHC: 31.9 g/dL (ref 30.0–36.0)
MCV: 95.8 fL (ref 80.0–100.0)
Monocytes Absolute: 0.5 10*3/uL (ref 0.1–1.0)
Monocytes Relative: 8 %
Neutro Abs: 2.6 10*3/uL (ref 1.7–7.7)
Neutrophils Relative %: 41 %
Platelets: 218 10*3/uL (ref 150–400)
RBC: 4.75 MIL/uL (ref 4.22–5.81)
RDW: 13.8 % (ref 11.5–15.5)
WBC: 6.3 10*3/uL (ref 4.0–10.5)
nRBC: 0 % (ref 0.0–0.2)

## 2019-03-28 LAB — TROPONIN I: Troponin I: <0.03 ng/mL (ref <0.03)

## 2019-03-28 LAB — ETHANOL: Alcohol, Ethyl (B): 130 mg/dL — ABNORMAL HIGH (ref <10)

## 2019-03-28 MED ORDER — MECLIZINE HCL 25 MG PO TABS: 25.0000 mg | ORAL_TABLET | Freq: Three times a day (TID) | ORAL | 0 refills | Status: DC | PRN

## 2019-03-28 MED ORDER — THIAMINE HCL 100 MG/ML IJ SOLN
100.0000 mg | Freq: Once | INTRAMUSCULAR | Status: AC
  Administered 2019-03-28: 100 mg via INTRAVENOUS
  Filled 2019-03-28: qty 2
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
## 2019-03-28 NOTE — Telephone Encounter (Signed)
1/2 sets BC positive for coag negative Staph  Discussed with Dr. Rhona Leavens and discussed with Dr. Luciana Axe for ID  This is presumed contaminant, does not fit clinical picture and no further evaluation is suggested.  Brendia Sacks, MD [facility] [phone]

## 2019-03-28 NOTE — ED Triage Notes (Signed)
Pt arriving via EMS for "feeling unwell". Pt was just discharged from hospital yesterday morning. No fever.

## 2019-03-28 NOTE — Telephone Encounter (Signed)
Please notify [facility] department in the event patient returns to ED.   Stacy Gardner, LCSW Transitions of Care Department System Wide [facility]  [phone]

## 2019-03-28 NOTE — ED Notes (Signed)
Patient transported to CT 

## 2019-03-28 NOTE — ED Notes (Signed)
Bed: WA21 Expected date:  Expected time:  Means of arrival:  Comments: EMS feeling unwell

## 2019-03-29 LAB — CULTURE, BLOOD (ROUTINE X 2): Special Requests: ADEQUATE

## 2019-03-29 LAB — NOVEL CORONAVIRUS, NAA (HOSP ORDER, SEND-OUT TO REF LAB; TAT 18-24 HRS): SARS-CoV-2, NAA: NOT DETECTED

## 2019-03-29 NOTE — Progress Notes (Signed)
Following pt for VOVID-19 results. Coronavirus Not Detected.

## 2019-04-01 LAB — CULTURE, BLOOD (ROUTINE X 2)
CULTURE: NO GROWTH
Special Requests: ADEQUATE

## 2019-04-14 ENCOUNTER — Emergency Department (HOSPITAL_COMMUNITY)
Admission: EM | Admit: 2019-04-14 | Discharge: 2019-04-14 | Disposition: A | Discharge status: Home / Self Care | Payer: Medicare Other | Attending: Emergency Medicine | Admitting: Emergency Medicine

## 2019-04-14 ENCOUNTER — Encounter (HOSPITAL_COMMUNITY): Payer: Self-pay | Admitting: *Deleted

## 2019-04-14 ENCOUNTER — Emergency Department (HOSPITAL_COMMUNITY): Payer: Medicare Other

## 2019-04-14 ENCOUNTER — Other Ambulatory Visit: Payer: Self-pay

## 2019-04-14 DIAGNOSIS — Y999 Unspecified external cause status: Secondary | ICD-10-CM | POA: Insufficient documentation

## 2019-04-14 DIAGNOSIS — J449 Chronic obstructive pulmonary disease, unspecified: Secondary | ICD-10-CM | POA: Diagnosis not present

## 2019-04-14 DIAGNOSIS — I10 Essential (primary) hypertension: Secondary | ICD-10-CM | POA: Insufficient documentation

## 2019-04-14 DIAGNOSIS — Y9339 Activity, other involving climbing, rappelling and jumping off: Secondary | ICD-10-CM | POA: Diagnosis not present

## 2019-04-14 DIAGNOSIS — R079 Chest pain, unspecified: Secondary | ICD-10-CM | POA: Diagnosis not present

## 2019-04-14 DIAGNOSIS — F17210 Nicotine dependence, cigarettes, uncomplicated: Secondary | ICD-10-CM | POA: Diagnosis not present

## 2019-04-14 DIAGNOSIS — Y9289 Other specified places as the place of occurrence of the external cause: Secondary | ICD-10-CM | POA: Insufficient documentation

## 2019-04-14 DIAGNOSIS — E119 Type 2 diabetes mellitus without complications: Secondary | ICD-10-CM | POA: Insufficient documentation

## 2019-04-14 DIAGNOSIS — S2242XA Multiple fractures of ribs, left side, initial encounter for closed fracture: Secondary | ICD-10-CM | POA: Insufficient documentation

## 2019-04-14 DIAGNOSIS — W1781XA Fall down embankment (hill), initial encounter: Secondary | ICD-10-CM | POA: Insufficient documentation

## 2019-04-14 DIAGNOSIS — W19XXXA Unspecified fall, initial encounter: Secondary | ICD-10-CM | POA: Diagnosis not present

## 2019-04-14 DIAGNOSIS — I2510 Atherosclerotic heart disease of native coronary artery without angina pectoris: Secondary | ICD-10-CM | POA: Diagnosis not present

## 2019-04-14 DIAGNOSIS — R0781 Pleurodynia: Secondary | ICD-10-CM | POA: Diagnosis not present

## 2019-04-14 DIAGNOSIS — R52 Pain, unspecified: Secondary | ICD-10-CM | POA: Diagnosis not present

## 2019-04-14 DIAGNOSIS — Z955 Presence of coronary angioplasty implant and graft: Secondary | ICD-10-CM | POA: Insufficient documentation

## 2019-04-14 DIAGNOSIS — S299XXA Unspecified injury of thorax, initial encounter: Secondary | ICD-10-CM | POA: Diagnosis present

## 2019-04-14 MED ORDER — HYDROCODONE-ACETAMINOPHEN 5-325 MG PO TABS: 1.0000 | ORAL_TABLET | Freq: Four times a day (QID) | ORAL | 0 refills | Status: DC | PRN

## 2019-04-14 MED ORDER — HYDROCODONE-ACETAMINOPHEN 5-325 MG PO TABS
2.0000 | ORAL_TABLET | Freq: Once | ORAL | Status: AC
  Administered 2019-04-14: 2 via ORAL
  Filled 2019-04-14: qty 2
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
## 2019-04-14 NOTE — ED Notes (Signed)
Pt refused discharge vitals. Pt escorted out by security.

## 2019-04-14 NOTE — ED Notes (Signed)
Pt ambulated 40 feet without assistance. Gait steady. SpO2 remained around 95% which is his baseline.

## 2019-04-14 NOTE — ED Triage Notes (Signed)
Pt is homeless, he is in the ED with his friend who is also being seen.  His friend reported pt was not able to get up, therefore, he called 911.  He reports L rib pain d/t fall.

## 2019-04-14 NOTE — ED Notes (Signed)
Bed: TA56 Expected date:  Expected time:  Means of arrival:  Comments: 5 M fall rib pain

## 2019-04-20 ENCOUNTER — Other Ambulatory Visit: Payer: Self-pay

## 2019-04-20 ENCOUNTER — Emergency Department (HOSPITAL_COMMUNITY): Payer: Medicare Other

## 2019-04-20 ENCOUNTER — Encounter (HOSPITAL_COMMUNITY): Payer: Self-pay | Admitting: Emergency Medicine

## 2019-04-20 ENCOUNTER — Emergency Department (HOSPITAL_COMMUNITY)
Admission: EM | Admit: 2019-04-20 | Discharge: 2019-04-20 | Disposition: A | Discharge status: Home / Self Care | Payer: Medicare Other | Attending: Emergency Medicine | Admitting: Emergency Medicine

## 2019-04-20 DIAGNOSIS — F1490 Cocaine use, unspecified, uncomplicated: Secondary | ICD-10-CM | POA: Diagnosis not present

## 2019-04-20 DIAGNOSIS — I252 Old myocardial infarction: Secondary | ICD-10-CM | POA: Diagnosis not present

## 2019-04-20 DIAGNOSIS — S2242XD Multiple fractures of ribs, left side, subsequent encounter for fracture with routine healing: Secondary | ICD-10-CM | POA: Insufficient documentation

## 2019-04-20 DIAGNOSIS — I10 Essential (primary) hypertension: Secondary | ICD-10-CM | POA: Diagnosis not present

## 2019-04-20 DIAGNOSIS — E119 Type 2 diabetes mellitus without complications: Secondary | ICD-10-CM | POA: Insufficient documentation

## 2019-04-20 DIAGNOSIS — Z7984 Long term (current) use of oral hypoglycemic drugs: Secondary | ICD-10-CM | POA: Diagnosis not present

## 2019-04-20 DIAGNOSIS — F17210 Nicotine dependence, cigarettes, uncomplicated: Secondary | ICD-10-CM | POA: Insufficient documentation

## 2019-04-20 DIAGNOSIS — G8911 Acute pain due to trauma: Secondary | ICD-10-CM | POA: Diagnosis present

## 2019-04-20 DIAGNOSIS — R52 Pain, unspecified: Secondary | ICD-10-CM | POA: Diagnosis not present

## 2019-04-20 DIAGNOSIS — S2241XA Multiple fractures of ribs, right side, initial encounter for closed fracture: Secondary | ICD-10-CM | POA: Diagnosis not present

## 2019-04-20 DIAGNOSIS — W1830XD Fall on same level, unspecified, subsequent encounter: Secondary | ICD-10-CM | POA: Diagnosis not present

## 2019-04-20 DIAGNOSIS — J449 Chronic obstructive pulmonary disease, unspecified: Secondary | ICD-10-CM | POA: Insufficient documentation

## 2019-04-20 DIAGNOSIS — W19XXXA Unspecified fall, initial encounter: Secondary | ICD-10-CM | POA: Diagnosis not present

## 2019-04-20 DIAGNOSIS — R0781 Pleurodynia: Secondary | ICD-10-CM | POA: Diagnosis not present

## 2019-04-20 NOTE — ED Triage Notes (Signed)
Pt reports left side pain d/t fall X 1 week ago. Pt was seen and treated at [facility] 6 days ago. ETOH on board. Pt denies CP, N/V/D, and Abdominal pain. VS WNL.
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
## 2019-04-22 ENCOUNTER — Other Ambulatory Visit: Payer: Self-pay

## 2019-04-22 ENCOUNTER — Encounter (HOSPITAL_COMMUNITY): Payer: Self-pay

## 2019-04-22 ENCOUNTER — Emergency Department (HOSPITAL_COMMUNITY)
Admission: EM | Admit: 2019-04-22 | Discharge: 2019-04-22 | Disposition: A | Discharge status: Home / Self Care | Payer: Medicare Other | Source: Home / Self Care | Attending: Emergency Medicine | Admitting: Emergency Medicine

## 2019-04-22 ENCOUNTER — Encounter (HOSPITAL_COMMUNITY): Payer: Self-pay | Admitting: *Deleted

## 2019-04-22 ENCOUNTER — Emergency Department (HOSPITAL_COMMUNITY)
Admission: EM | Admit: 2019-04-22 | Discharge: 2019-04-22 | Disposition: A | Discharge status: Home / Self Care | Payer: Medicare Other | Attending: Emergency Medicine | Admitting: Emergency Medicine

## 2019-04-22 DIAGNOSIS — E119 Type 2 diabetes mellitus without complications: Secondary | ICD-10-CM | POA: Insufficient documentation

## 2019-04-22 DIAGNOSIS — I2510 Atherosclerotic heart disease of native coronary artery without angina pectoris: Secondary | ICD-10-CM | POA: Insufficient documentation

## 2019-04-22 DIAGNOSIS — F10929 Alcohol use, unspecified with intoxication, unspecified: Secondary | ICD-10-CM | POA: Diagnosis present

## 2019-04-22 DIAGNOSIS — F17210 Nicotine dependence, cigarettes, uncomplicated: Secondary | ICD-10-CM

## 2019-04-22 DIAGNOSIS — Z7982 Long term (current) use of aspirin: Secondary | ICD-10-CM | POA: Insufficient documentation

## 2019-04-22 DIAGNOSIS — R0789 Other chest pain: Secondary | ICD-10-CM | POA: Insufficient documentation

## 2019-04-22 DIAGNOSIS — W19XXXA Unspecified fall, initial encounter: Secondary | ICD-10-CM | POA: Diagnosis not present

## 2019-04-22 DIAGNOSIS — R52 Pain, unspecified: Secondary | ICD-10-CM | POA: Diagnosis not present

## 2019-04-22 DIAGNOSIS — I10 Essential (primary) hypertension: Secondary | ICD-10-CM

## 2019-04-22 DIAGNOSIS — Z79899 Other long term (current) drug therapy: Secondary | ICD-10-CM | POA: Insufficient documentation

## 2019-04-22 DIAGNOSIS — J449 Chronic obstructive pulmonary disease, unspecified: Secondary | ICD-10-CM

## 2019-04-22 DIAGNOSIS — F10920 Alcohol use, unspecified with intoxication, uncomplicated: Secondary | ICD-10-CM

## 2019-04-22 DIAGNOSIS — R079 Chest pain, unspecified: Secondary | ICD-10-CM

## 2019-04-22 LAB — CBC
HCT: 47.3 % (ref 39.0–52.0)
Hemoglobin: 15.7 g/dL (ref 13.0–17.0)
MCH: 31.5 pg (ref 26.0–34.0)
MCHC: 33.2 g/dL (ref 30.0–36.0)
MCV: 95 fL (ref 80.0–100.0)
Platelets: 150 10*3/uL (ref 150–400)
RBC: 4.98 MIL/uL (ref 4.22–5.81)
RDW: 16.3 % — ABNORMAL HIGH (ref 11.5–15.5)
WBC: 7.6 10*3/uL (ref 4.0–10.5)
nRBC: 0 % (ref 0.0–0.2)

## 2019-04-22 LAB — TROPONIN I
Troponin I: <0.03 ng/mL (ref <0.03)
Troponin I: <0.03 ng/mL (ref <0.03)

## 2019-04-22 LAB — BASIC METABOLIC PANEL
Anion gap: 11 (ref 5–15)
BUN: 13 mg/dL (ref 6–20)
CO2: 20 mmol/L — ABNORMAL LOW (ref 22–32)
Calcium: 8.3 mg/dL — ABNORMAL LOW (ref 8.9–10.3)
Chloride: 108 mmol/L (ref 98–111)
Creatinine, Ser: 0.59 mg/dL — ABNORMAL LOW (ref 0.61–1.24)
GFR calc Af Amer: >60 mL/min (ref >60)
GFR calc non Af Amer: >60 mL/min (ref >60)
Glucose, Bld: 109 mg/dL — ABNORMAL HIGH (ref 70–99)
Potassium: 3.7 mmol/L (ref 3.5–5.1)
Sodium: 139 mmol/L (ref 135–145)

## 2019-04-22 NOTE — ED Triage Notes (Signed)
Pt BIB [facility]. Pt was picked up at the bus stop, EMS reports pt fell while trying to urinate. Pt is c/o of left side pain d/t a fall x1 week ago.

## 2019-04-22 NOTE — ED Notes (Signed)
Discharge instructions were reviewed with patient. Pt verbalized understanding. Pt was compliant when RN told patient he was being discharged, and ambulated to the lobby. RN walked beside patient for safety.
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
## 2019-04-22 NOTE — ED Provider Notes (Signed)
[facility]-EMERGENCY DEPT Provider Note   CSN: [id number] Arrival date & time: 04/22/19  0147    History   Chief Complaint Chief Complaint  Patient presents with  . Chest Pain  . Homeless    HPI Warren Salas is a 58 y.o. male.     Patient presents for evaluation of chest pain.  I just saw the patient less than an hour ago for complaints of rib pain from a recent fall.  Patient was discharged and then immediately tried to check back in.  Patient reports that he is now experiencing chest pain and thinks he is having "another heart attack".     Past Medical History:  Diagnosis Date  . Back pain   . COPD (chronic obstructive pulmonary disease) (HCC)   . Diabetes mellitus without complication (HCC)   . Hypertension   . STEMI (ST elevation myocardial infarction) (HCC) 10/2017    Patient Active Problem List   Diagnosis Date Noted  . Burn of chest wall 03/27/2019  . Suspected Covid-19 Virus Infection 03/27/2019  . Severe recurrent major depression w/psychotic features, mood-congruent (HCC) 12/05/2018  . Moderate cocaine use disorder (HCC) 11/16/2018  . MDD (major depressive disorder), recurrent severe, without psychosis (HCC) 10/27/2018  . Alcohol dependence with alcohol-induced mood disorder (HCC)   . CAD (coronary artery disease) 12/12/2017  . Old MI (myocardial infarction) 12/12/2017  . Tobacco abuse 12/12/2017  . Type 2 diabetes mellitus with complication, without long-term current use of insulin (HCC) 12/12/2017  . Acute MI, inferior wall (HCC) 11/01/2017  . Acute inferior myocardial infarction [facility])     Past Surgical History:  Procedure Laterality Date  . APPENDECTOMY    . CORONARY STENT INTERVENTION N/A 12/29/2017   Procedure: CORONARY STENT INTERVENTION - LAD;  Surgeon: Corky Crafts, MD;  Location: [facility] INVASIVE CV LAB;  Service: Cardiovascular;  Laterality: N/A;  . CORONARY/GRAFT ACUTE MI REVASCULARIZATION N/A 11/01/2017   Procedure:  Coronary/Graft Acute MI Revascularization;  Surgeon: Corky Crafts, MD;  Location: [facility] INVASIVE CV LAB;  Service: Cardiovascular;  Laterality: N/A;  . LEFT HEART CATH AND CORONARY ANGIOGRAPHY N/A 11/01/2017   Procedure: LEFT HEART CATH AND CORONARY ANGIOGRAPHY;  Surgeon: Corky Crafts, MD;  Location: [facility] INVASIVE CV LAB;  Service: Cardiovascular;  Laterality: N/A;        Home Medications    Prior to Admission medications   Medication Sig Start Date End Date Taking? Authorizing Provider  albuterol (PROVENTIL HFA;VENTOLIN HFA) 108 (90 Base) MCG/ACT inhaler Inhale 1-2 puffs into the lungs every 6 (six) hours as needed for wheezing. 03/27/19  Yes Jerald Kief, MD  aspirin EC 81 MG tablet Take 1 tablet (81 mg total) by mouth daily. Patient not taking: Reported on 03/27/2019 11/22/18   Micheal Likens, MD  atorvastatin (LIPITOR) 80 MG tablet Take 1 tablet (80 mg total) by mouth daily at 6 PM. For high cholesterol Patient not taking: Reported on 03/27/2019 12/07/18   Money, Gerlene Burdock, FNP  budesonide-formoterol (SYMBICORT) 160-4.5 MCG/ACT inhaler Inhale 2 puffs into the lungs 2 (two) times daily as needed (For shortness of breath.). Patient not taking: Reported on 03/27/2019 11/22/18   Micheal Likens, MD  gabapentin (NEURONTIN) 300 MG capsule Take 1 capsule (300 mg total) by mouth 2 (two) times daily. Patient not taking: Reported on 03/27/2019 12/09/18   Charm Rings, NP  HYDROcodone-acetaminophen (NORCO/VICODIN) 5-325 MG tablet Take 1-2 tablets by mouth every 6 (six) hours as needed. Patient not taking: Reported  on 04/22/2019 04/14/19   Roxy Horseman, PA-C  hydrOXYzine (ATARAX/VISTARIL) 25 MG tablet Take 1 tablet (25 mg total) by mouth every 6 (six) hours as needed for anxiety. Patient not taking: Reported on 03/27/2019 12/07/18   Money, Gerlene Burdock, FNP  isosorbide mononitrate (IMDUR) 30 MG 24 hr tablet Take 1 tablet (30 mg total) by mouth daily. Patient not taking:  Reported on 03/27/2019 12/08/18   Money, Gerlene Burdock, FNP  lisinopril (PRINIVIL,ZESTRIL) 10 MG tablet Take 0.5 tablets (5 mg total) by mouth daily. For high blood pressure Patient not taking: Reported on 03/27/2019 12/07/18   Money, Gerlene Burdock, FNP  meclizine (ANTIVERT) 25 MG tablet Take 1 tablet (25 mg total) by mouth 3 (three) times daily as needed for dizziness. Patient not taking: Reported on 04/22/2019 03/28/19   Gilda Crease, MD  metFORMIN (GLUCOPHAGE) 500 MG tablet Take 1 tablet (500 mg total) by mouth 2 (two) times daily. For high blood sugar Patient not taking: Reported on 03/27/2019 12/07/18   Money, Gerlene Burdock, FNP  metoprolol tartrate (LOPRESSOR) 50 MG tablet Take 1 tablet (50 mg total) by mouth 2 (two) times daily. For high blood pressure Patient not taking: Reported on 03/27/2019 12/07/18   Money, Gerlene Burdock, FNP  nitroGLYCERIN (NITROSTAT) 0.4 MG SL tablet Place 1 tablet (0.4 mg total) under the tongue every 5 (five) minutes as needed. Patient not taking: Reported on 03/27/2019 11/22/18   Micheal Likens, MD  pantoprazole (PROTONIX) 40 MG tablet Take 1 tablet (40 mg total) by mouth daily. Patient not taking: Reported on 03/27/2019 12/08/18   Money, Gerlene Burdock, FNP  sertraline (ZOLOFT) 100 MG tablet Take 1 tablet (100 mg total) by mouth daily. For mood control Patient not taking: Reported on 03/27/2019 12/08/18   Money, Gerlene Burdock, FNP  silver sulfADIAZINE (SILVADENE) 1 % cream Apply 1 application topically daily. Patient not taking: Reported on 03/27/2019 03/03/19   Alvira Monday, MD  ticagrelor (BRILINTA) 90 MG TABS tablet Take 1 tablet (90 mg total) by mouth 2 (two) times daily. Patient not taking: Reported on 03/27/2019 12/07/18   Money, Gerlene Burdock, FNP    Family History Family History  Problem Relation Age of Onset  . Hypertension Mother   . Diabetes Mother   . Hypertension Father   . Diabetes Father     Social History Social History   Tobacco Use  . Smoking status:  Current Every Day Smoker    Packs/day: 1.00    Types: Cigarettes  . Smokeless tobacco: Never Used  Substance Use Topics  . Alcohol use: Yes    Alcohol/week: 12.0 standard drinks    Types: 12 Cans of beer per week  . Drug use: Yes    Types: Cocaine    Comment: last use 3-4 days ago.     Allergies   Patient has no known allergies.   Review of Systems Review of Systems  Cardiovascular: Positive for chest pain.  All other systems reviewed and are negative.    Physical Exam Updated Vital Signs BP 106/71   Pulse 85   Temp 97.8 F (36.6 C) (Oral)   Resp 16   Ht  (1.676 m)   Wt 68.4 kg   SpO2 100%   BMI 24.34 kg/m   Physical Exam Vitals signs and nursing note reviewed.  Constitutional:      General: He is not in acute distress.    Appearance: Normal appearance. He is well-developed.  HENT:     Head: Normocephalic and  atraumatic.     Right Ear: Hearing normal.     Left Ear: Hearing normal.     Nose: Nose normal.  Eyes:     Conjunctiva/sclera: Conjunctivae normal.     Pupils: Pupils are equal, round, and reactive to light.  Neck:     Musculoskeletal: Normal range of motion and neck supple.  Cardiovascular:     Rate and Rhythm: Regular rhythm.     Heart sounds: S1 normal and S2 normal. No murmur. No friction rub. No gallop.   Pulmonary:     Effort: Pulmonary effort is normal. No respiratory distress.     Breath sounds: Normal breath sounds.  Chest:     Chest wall: Tenderness (left chest) present.  Abdominal:     General: Bowel sounds are normal.     Palpations: Abdomen is soft.     Tenderness: There is no abdominal tenderness. There is no guarding or rebound. Negative signs include Murphy's sign and McBurney's sign.     Hernia: No hernia is present.  Musculoskeletal: Normal range of motion.  Skin:    General: Skin is warm and dry.     Findings: No rash.  Neurological:     Mental Status: He is alert and oriented to person, place, and time.     GCS: GCS  eye subscore is 4. GCS verbal subscore is 5. GCS motor subscore is 6.     Cranial Nerves: No cranial nerve deficit.     Sensory: No sensory deficit.     Coordination: Coordination normal.  Psychiatric:        Speech: Speech normal.        Behavior: Behavior normal.        Thought Content: Thought content normal.      ED Treatments / Results  Labs (all labs ordered are listed, but only abnormal results are displayed) Labs Reviewed  CBC - Abnormal; Notable for the following components:      Result Value   RDW 16.3 (*)    All other components within normal limits  BASIC METABOLIC PANEL - Abnormal; Notable for the following components:   CO2 20 (*)    Glucose, Bld 109 (*)    Creatinine, Ser 0.59 (*)    Calcium 8.3 (*)    All other components within normal limits  TROPONIN I  TROPONIN I    EKG EKG Interpretation  Date/Time:  Sunday April 22 2019 01:59:39 EDT Ventricular Rate:  99 PR Interval:    QRS Duration: 97 QT Interval:  346 QTC Calculation: 444 R Axis:   90 Text Interpretation:  Sinus rhythm Borderline right axis deviation Baseline wander in lead(s) V3 No significant change since last tracing Confirmed by Gilda Crease [phone]) on 04/22/2019 2:01:43 AM   Radiology No results found.  Procedures Procedures (including critical care time)  Medications Ordered in ED Medications - No data to display   Initial Impression / Assessment and Plan / ED Course  I have reviewed the triage vital signs and the nursing notes.  Pertinent labs & imaging results that were available during my care of the patient were reviewed by me and considered in my medical decision making (see chart for details).        Patient returns to the emergency department reporting that he is experiencing chest pain.  This is the third time I have seen this patient in the last 48 hours.  I just saw him earlier tonight at which time he was complaining of  pain from his known rib fractures.   When he did not get pain medicine or a room to stay in for the night, he immediately checked back in stating that he is having chest pain that is not related to the ribs.  He reports that he has multiple stents and he thinks he is having pain from his heart.  EKG performed does not show any acute changes.  First troponin was negative.  Patient was monitored for 3 hours and a second troponin was also negative.  I do believe the patient is malingering because he is homeless and is raining, do not feel he requires hospitalization for further evaluation.  Final Clinical Impressions(s) / ED Diagnoses   Final diagnoses:  Nonspecific chest pain    ED Discharge Orders    None       Gilda CreasePollina, Yves Fodor J, MD 04/22/19 [phone]

## 2019-04-22 NOTE — ED Notes (Signed)
Bed: FF63 Expected date:  Expected time:  Means of arrival:  Comments: EMS 58 yo male hx fx ribs intoxicated

## 2019-05-24 ENCOUNTER — Other Ambulatory Visit: Payer: Self-pay

## 2019-05-24 DIAGNOSIS — J449 Chronic obstructive pulmonary disease, unspecified: Secondary | ICD-10-CM | POA: Insufficient documentation

## 2019-05-24 DIAGNOSIS — F17210 Nicotine dependence, cigarettes, uncomplicated: Secondary | ICD-10-CM | POA: Diagnosis not present

## 2019-05-24 DIAGNOSIS — E119 Type 2 diabetes mellitus without complications: Secondary | ICD-10-CM | POA: Diagnosis not present

## 2019-05-24 DIAGNOSIS — I252 Old myocardial infarction: Secondary | ICD-10-CM | POA: Insufficient documentation

## 2019-05-24 DIAGNOSIS — Z79899 Other long term (current) drug therapy: Secondary | ICD-10-CM | POA: Diagnosis not present

## 2019-05-24 DIAGNOSIS — I10 Essential (primary) hypertension: Secondary | ICD-10-CM | POA: Diagnosis not present

## 2019-05-24 DIAGNOSIS — T300 Burn of unspecified body region, unspecified degree: Secondary | ICD-10-CM | POA: Diagnosis not present

## 2019-05-24 DIAGNOSIS — R5383 Other fatigue: Secondary | ICD-10-CM | POA: Insufficient documentation

## 2019-05-24 DIAGNOSIS — R05 Cough: Secondary | ICD-10-CM | POA: Diagnosis not present

## 2019-05-24 DIAGNOSIS — Z590 Homelessness: Secondary | ICD-10-CM | POA: Insufficient documentation

## 2019-05-24 DIAGNOSIS — F10920 Alcohol use, unspecified with intoxication, uncomplicated: Secondary | ICD-10-CM | POA: Insufficient documentation

## 2019-05-25 ENCOUNTER — Emergency Department (HOSPITAL_COMMUNITY)
Admission: EM | Admit: 2019-05-25 | Discharge: 2019-05-25 | Disposition: A | Discharge status: Home / Self Care | Payer: Medicare Other | Attending: Emergency Medicine | Admitting: Emergency Medicine

## 2019-05-25 ENCOUNTER — Encounter (HOSPITAL_COMMUNITY): Payer: Self-pay

## 2019-05-25 DIAGNOSIS — R5383 Other fatigue: Secondary | ICD-10-CM

## 2019-05-25 DIAGNOSIS — F10920 Alcohol use, unspecified with intoxication, uncomplicated: Secondary | ICD-10-CM

## 2019-05-25 DIAGNOSIS — Z5900 Homelessness unspecified: Secondary | ICD-10-CM

## 2019-05-25 LAB — COMPREHENSIVE METABOLIC PANEL
ALT: 39 U/L (ref 0–44)
AST: 69 U/L — ABNORMAL HIGH (ref 15–41)
Albumin: 3.3 g/dL — ABNORMAL LOW (ref 3.5–5.0)
Alkaline Phosphatase: 119 U/L (ref 38–126)
Anion gap: 9 (ref 5–15)
BUN: 8 mg/dL (ref 6–20)
CO2: 25 mmol/L (ref 22–32)
Calcium: 8 mg/dL — ABNORMAL LOW (ref 8.9–10.3)
Chloride: 105 mmol/L (ref 98–111)
Creatinine, Ser: 0.59 mg/dL — ABNORMAL LOW (ref 0.61–1.24)
GFR calc Af Amer: >60 mL/min (ref >60)
GFR calc non Af Amer: >60 mL/min (ref >60)
Glucose, Bld: 105 mg/dL — ABNORMAL HIGH (ref 70–99)
Potassium: 3.8 mmol/L (ref 3.5–5.1)
Sodium: 139 mmol/L (ref 135–145)
Total Bilirubin: 0.6 mg/dL (ref 0.3–1.2)
Total Protein: 6 g/dL — ABNORMAL LOW (ref 6.5–8.1)

## 2019-05-25 LAB — URINALYSIS, ROUTINE W REFLEX MICROSCOPIC
Bilirubin Urine: NEGATIVE
Glucose, UA: NEGATIVE mg/dL
Hgb urine dipstick: NEGATIVE
Ketones, ur: NEGATIVE mg/dL
Leukocytes,Ua: NEGATIVE
Nitrite: NEGATIVE
Protein, ur: NEGATIVE mg/dL
Specific Gravity, Urine: 1.006 (ref 1.005–1.030)
pH: 6 (ref 5.0–8.0)

## 2019-05-25 LAB — CBC
HCT: 39.1 % (ref 39.0–52.0)
Hemoglobin: 13.1 g/dL (ref 13.0–17.0)
MCH: 32.8 pg (ref 26.0–34.0)
MCHC: 33.5 g/dL (ref 30.0–36.0)
MCV: 97.8 fL (ref 80.0–100.0)
Platelets: 152 10*3/uL (ref 150–400)
RBC: 4 MIL/uL — ABNORMAL LOW (ref 4.22–5.81)
RDW: 18.3 % — ABNORMAL HIGH (ref 11.5–15.5)
WBC: 7.3 10*3/uL (ref 4.0–10.5)
nRBC: 0 % (ref 0.0–0.2)

## 2019-05-25 LAB — RAPID URINE DRUG SCREEN, HOSP PERFORMED
Amphetamines: NOT DETECTED
Barbiturates: NOT DETECTED
Benzodiazepines: NOT DETECTED
Cocaine: NOT DETECTED
Opiates: NOT DETECTED
Tetrahydrocannabinol: NOT DETECTED

## 2019-05-25 LAB — TROPONIN I: Troponin I: <0.03 ng/mL (ref <0.03)

## 2019-05-25 LAB — ETHANOL: Alcohol, Ethyl (B): 251 mg/dL — ABNORMAL HIGH (ref <10)

## 2019-05-25 NOTE — ED Notes (Signed)
Pt verbalized discharge instructions and follow up care. Alert and ambulatory. No IV.  

## 2019-05-25 NOTE — Discharge Instructions (Addendum)
Your work-up in the emergency department today has been reassuring.  We recommend follow-up with a primary care doctor.

## 2019-05-25 NOTE — ED Provider Notes (Signed)
[facility]-EMERGENCY DEPT Provider Note   CSN: [id number] Arrival date & time: 05/24/19  2356    History   Chief Complaint Chief Complaint  Patient presents with  . Cough  . Urinary Tract Infection    HPI Warren Salas is a 58 y.o. male.     58 year old male with history of hypertension, diabetes, COPD, STEMI s/p stenting, medication noncompliance, homelessness, ETOH abuse presents to the emergency department for evaluation of fatigue.  He states that he feels as though he has been sleeping too much.  He does not feel that this is normal.  In triage, he expressed thoughts that he may have the coronavirus, though he has had no fevers, cough, body aches.  Endorsing some dysuria in triage as well, but denies this during my encounter with him.  [place] [facility].  Confirms that he has been drinking alcohol today.  He does not think this is related to his fatigue.     Past Medical History:  Diagnosis Date  . Back pain   . COPD (chronic obstructive pulmonary disease) (HCC)   . Diabetes mellitus without complication (HCC)   . Hypertension   . STEMI (ST elevation myocardial infarction) (HCC) 10/2017    Patient Active Problem List   Diagnosis Date Noted  . Burn of chest wall 03/27/2019  . Suspected Covid-19 Virus Infection 03/27/2019  . Severe recurrent major depression w/psychotic features, mood-congruent (HCC) 12/05/2018  . Moderate cocaine use disorder (HCC) 11/16/2018  . MDD (major depressive disorder), recurrent severe, without psychosis (HCC) 10/27/2018  . Alcohol dependence with alcohol-induced mood disorder (HCC)   . CAD (coronary artery disease) 12/12/2017  . Old MI (myocardial infarction) 12/12/2017  . Tobacco abuse 12/12/2017  . Type 2 diabetes mellitus with complication, without long-term current use of insulin (HCC) 12/12/2017  . Acute MI, inferior wall (HCC) 11/01/2017  . Acute inferior myocardial infarction [facility](HCC)      Past Surgical History:  Procedure Laterality Date  . APPENDECTOMY    . CORONARY STENT INTERVENTION N/A 12/29/2017   Procedure: CORONARY STENT INTERVENTION - LAD;  Surgeon: Corky CraftsVaranasi, Jayadeep S, MD;  Location: [facility] INVASIVE CV LAB;  Service: Cardiovascular;  Laterality: N/A;  . CORONARY/GRAFT ACUTE MI REVASCULARIZATION N/A 11/01/2017   Procedure: Coronary/Graft Acute MI Revascularization;  Surgeon: Corky CraftsVaranasi, Jayadeep S, MD;  [facility];  Service: Cardiovascular;  Laterality: N/A;  . LEFT HEART CATH AND CORONARY ANGIOGRAPHY N/A 11/01/2017   Procedure: LEFT HEART CATH AND CORONARY ANGIOGRAPHY;  Surgeon: Corky CraftsVaranasi, Jayadeep S, MD;  [facility];  Service: Cardiovascular;  Laterality: N/A;        Home Medications    Prior to Admission medications   Medication Sig Start Date End Date Taking? Authorizing Provider  albuterol (PROVENTIL HFA;VENTOLIN HFA) 108 (90 Base) MCG/ACT inhaler Inhale 1-2 puffs into the lungs every 6 (six) hours as needed for wheezing. 03/27/19  Yes Jerald Kiefhiu, Stephen K, MD  aspirin EC 81 MG tablet Take 1 tablet (81 mg total) by mouth daily. Patient not taking: Reported on 03/27/2019 11/22/18   Micheal Likensainville, Christopher T, MD  atorvastatin (LIPITOR) 80 MG tablet Take 1 tablet (80 mg total) by mouth daily at 6 PM. For high cholesterol Patient not taking: Reported on 03/27/2019 12/07/18   Money, Gerlene Burdockravis B, FNP  budesonide-formoterol (SYMBICORT) 160-4.5 MCG/ACT inhaler Inhale 2 puffs into the lungs 2 (two) times daily as needed (For shortness of breath.). Patient not taking: Reported on 03/27/2019 11/22/18  Micheal Likens, MD  gabapentin (NEURONTIN) 300 MG capsule Take 1 capsule (300 mg total) by mouth 2 (two) times daily. Patient not taking: Reported on 03/27/2019 12/09/18   Charm Rings, NP  HYDROcodone-acetaminophen (NORCO/VICODIN) 5-325 MG tablet Take 1-2 tablets by mouth every 6 (six) hours as needed. Patient not taking: Reported on 04/22/2019  04/14/19   Roxy Horseman, PA-C  hydrOXYzine (ATARAX/VISTARIL) 25 MG tablet Take 1 tablet (25 mg total) by mouth every 6 (six) hours as needed for anxiety. Patient not taking: Reported on 03/27/2019 12/07/18   Money, Gerlene Burdock, FNP  isosorbide mononitrate (IMDUR) 30 MG 24 hr tablet Take 1 tablet (30 mg total) by mouth daily. Patient not taking: Reported on 03/27/2019 12/08/18   Money, Gerlene Burdock, FNP  lisinopril (PRINIVIL,ZESTRIL) 10 MG tablet Take 0.5 tablets (5 mg total) by mouth daily. For high blood pressure Patient not taking: Reported on 03/27/2019 12/07/18   Money, Gerlene Burdock, FNP  meclizine (ANTIVERT) 25 MG tablet Take 1 tablet (25 mg total) by mouth 3 (three) times daily as needed for dizziness. Patient not taking: Reported on 04/22/2019 03/28/19   Gilda Crease, MD  metFORMIN (GLUCOPHAGE) 500 MG tablet Take 1 tablet (500 mg total) by mouth 2 (two) times daily. For high blood sugar Patient not taking: Reported on 03/27/2019 12/07/18   Money, Gerlene Burdock, FNP  metoprolol tartrate (LOPRESSOR) 50 MG tablet Take 1 tablet (50 mg total) by mouth 2 (two) times daily. For high blood pressure Patient not taking: Reported on 03/27/2019 12/07/18   Money, Gerlene Burdock, FNP  nitroGLYCERIN (NITROSTAT) 0.4 MG SL tablet Place 1 tablet (0.4 mg total) under the tongue every 5 (five) minutes as needed. Patient not taking: Reported on 03/27/2019 11/22/18   Micheal Likens, MD  pantoprazole (PROTONIX) 40 MG tablet Take 1 tablet (40 mg total) by mouth daily. Patient not taking: Reported on 03/27/2019 12/08/18   Money, Gerlene Burdock, FNP  sertraline (ZOLOFT) 100 MG tablet Take 1 tablet (100 mg total) by mouth daily. For mood control Patient not taking: Reported on 03/27/2019 12/08/18   Money, Gerlene Burdock, FNP  silver sulfADIAZINE (SILVADENE) 1 % cream Apply 1 application topically daily. Patient not taking: Reported on 03/27/2019 03/03/19   Alvira Monday, MD  ticagrelor (BRILINTA) 90 MG TABS tablet Take 1 tablet (90 mg  total) by mouth 2 (two) times daily. Patient not taking: Reported on 03/27/2019 12/07/18   Money, Gerlene Burdock, FNP    Family History Family History  Problem Relation Age of Onset  . Hypertension Mother   . Diabetes Mother   . Hypertension Father   . Diabetes Father     Social History Social History   Tobacco Use  . Smoking status: Current Every Day Smoker    Packs/day: 1.00    Types: Cigarettes  . Smokeless tobacco: Never Used  Substance Use Topics  . Alcohol use: Yes    Alcohol/week: 12.0 standard drinks    Types: 12 Cans of beer per week  . Drug use: Yes    Types: Cocaine    Comment: last use 3-4 days ago.     Allergies   Patient has no known allergies.   Review of Systems Review of Systems Ten systems reviewed and are negative for acute change, except as noted in the HPI.    Physical Exam Updated Vital Signs BP 124/70   Pulse 84   Temp 97.8 F (36.6 C) (Oral)   Resp 16   SpO2 97%  Physical Exam Vitals signs and nursing note reviewed.  Constitutional:      General: He is not in acute distress.    Appearance: He is well-developed. He is not diaphoretic.     Comments: Disheveled appearing. Tanned skin.  HENT:     Head: Normocephalic and atraumatic.  Eyes:     General: No scleral icterus.    Conjunctiva/sclera: Conjunctivae normal.  Neck:     Musculoskeletal: Normal range of motion.  Cardiovascular:     Rate and Rhythm: Normal rate and regular rhythm.     Pulses: Normal pulses.  Pulmonary:     Effort: Pulmonary effort is normal. No respiratory distress.     Comments: Respirations even and unlabored Musculoskeletal: Normal range of motion.  Skin:    General: Skin is warm and dry.     Coloration: Skin is not pale.     Findings: No erythema or rash.  Neurological:     General: No focal deficit present.     Mental Status: He is alert and oriented to person, place, and time.     Coordination: Coordination normal.     Comments: GCS 15. Patient moving  all extremities spontaneously.  Psychiatric:        Behavior: Behavior normal.      ED Treatments / Results  Labs (all labs ordered are listed, but only abnormal results are displayed) Labs Reviewed  URINALYSIS, ROUTINE W REFLEX MICROSCOPIC - Abnormal; Notable for the following components:      Result Value   Color, Urine STRAW (*)    All other components within normal limits  CBC - Abnormal; Notable for the following components:   RBC 4.00 (*)    RDW 18.3 (*)    All other components within normal limits  ETHANOL - Abnormal; Notable for the following components:   Alcohol, Ethyl (B) 251 (*)    All other components within normal limits  COMPREHENSIVE METABOLIC PANEL - Abnormal; Notable for the following components:   Glucose, Bld 105 (*)    Creatinine, Ser 0.59 (*)    Calcium 8.0 (*)    Total Protein 6.0 (*)    Albumin 3.3 (*)    AST 69 (*)    All other components within normal limits  URINE CULTURE  RAPID URINE DRUG SCREEN, HOSP PERFORMED  TROPONIN I    EKG None  Radiology No results found.  Procedures Procedures (including critical care time)  Medications Ordered in ED Medications - No data to display   Initial Impression / Assessment and Plan / ED Course  I have reviewed the triage vital signs and the nursing notes.  Pertinent labs & imaging results that were available during my care of the patient were reviewed by me and considered in my medical decision making (see chart for details).        58 year old male, well-known to the emergency department, presents for generalized complaints of fatigue and excessive sleeping.  He is homeless and appears disheveled.  Patient in no acute distress with stable vital signs.  He is afebrile.  Patient underwent laboratory screening which has been reassuring today.  No significant electrolyte derangements.  No leukocytosis.  Liver and kidney function are largely preserved.  He was noted to be intoxicated with an alcohol  of 251.  Given reassuring assessment, I believe he is stable for continued outpatient follow-up.  He has been encouraged to follow-up with a primary care doctor.  Also discussed that his fatigue may be directly linked to  his lifestyle choices.  Return precautions discussed and provided. Patient discharged in stable condition with no unaddressed concerns.   Final Clinical Impressions(s) / ED Diagnoses   Final diagnoses:  Homelessness  Fatigue, unspecified type  Alcoholic intoxication without complication [facility])    ED Discharge Orders    None       Antony Madura, PA-C 05/25/19 [id number]    Nira Conn, MD 05/30/19 [phone]

## 2019-05-25 NOTE — ED Triage Notes (Signed)
Pt called EMS from [employer] stating that he thinks he has COVID and possibly a UTI because it burns when he urinates

## 2019-05-26 ENCOUNTER — Emergency Department (HOSPITAL_COMMUNITY): Payer: Medicare Other

## 2019-05-26 ENCOUNTER — Encounter (HOSPITAL_COMMUNITY): Payer: Self-pay | Admitting: Emergency Medicine

## 2019-05-26 ENCOUNTER — Emergency Department (HOSPITAL_COMMUNITY)
Admission: EM | Admit: 2019-05-26 | Discharge: 2019-05-27 | Disposition: A | Discharge status: Home / Self Care | Payer: Medicare Other | Source: Home / Self Care | Attending: Emergency Medicine | Admitting: Emergency Medicine

## 2019-05-26 ENCOUNTER — Emergency Department (HOSPITAL_COMMUNITY)
Admission: EM | Admit: 2019-05-26 | Discharge: 2019-05-26 | Disposition: A | Discharge status: Home / Self Care | Payer: Medicare Other | Attending: Emergency Medicine | Admitting: Emergency Medicine

## 2019-05-26 ENCOUNTER — Encounter (HOSPITAL_COMMUNITY): Payer: Self-pay | Admitting: *Deleted

## 2019-05-26 ENCOUNTER — Other Ambulatory Visit: Payer: Self-pay

## 2019-05-26 DIAGNOSIS — I2510 Atherosclerotic heart disease of native coronary artery without angina pectoris: Secondary | ICD-10-CM | POA: Insufficient documentation

## 2019-05-26 DIAGNOSIS — F1010 Alcohol abuse, uncomplicated: Secondary | ICD-10-CM | POA: Insufficient documentation

## 2019-05-26 DIAGNOSIS — I10 Essential (primary) hypertension: Secondary | ICD-10-CM | POA: Insufficient documentation

## 2019-05-26 DIAGNOSIS — F17210 Nicotine dependence, cigarettes, uncomplicated: Secondary | ICD-10-CM | POA: Insufficient documentation

## 2019-05-26 DIAGNOSIS — I252 Old myocardial infarction: Secondary | ICD-10-CM | POA: Insufficient documentation

## 2019-05-26 DIAGNOSIS — Z5900 Homelessness unspecified: Secondary | ICD-10-CM

## 2019-05-26 DIAGNOSIS — J449 Chronic obstructive pulmonary disease, unspecified: Secondary | ICD-10-CM | POA: Insufficient documentation

## 2019-05-26 DIAGNOSIS — W19XXXA Unspecified fall, initial encounter: Secondary | ICD-10-CM | POA: Diagnosis not present

## 2019-05-26 DIAGNOSIS — Z79899 Other long term (current) drug therapy: Secondary | ICD-10-CM | POA: Insufficient documentation

## 2019-05-26 DIAGNOSIS — F10920 Alcohol use, unspecified with intoxication, uncomplicated: Secondary | ICD-10-CM | POA: Insufficient documentation

## 2019-05-26 DIAGNOSIS — R079 Chest pain, unspecified: Secondary | ICD-10-CM | POA: Diagnosis not present

## 2019-05-26 DIAGNOSIS — E119 Type 2 diabetes mellitus without complications: Secondary | ICD-10-CM | POA: Insufficient documentation

## 2019-05-26 DIAGNOSIS — Z7984 Long term (current) use of oral hypoglycemic drugs: Secondary | ICD-10-CM | POA: Insufficient documentation

## 2019-05-26 DIAGNOSIS — R0789 Other chest pain: Secondary | ICD-10-CM

## 2019-05-26 DIAGNOSIS — Z7982 Long term (current) use of aspirin: Secondary | ICD-10-CM | POA: Diagnosis not present

## 2019-05-26 LAB — COMPREHENSIVE METABOLIC PANEL
ALT: 48 U/L — ABNORMAL HIGH (ref 0–44)
AST: 160 U/L — ABNORMAL HIGH (ref 15–41)
Albumin: 3.4 g/dL — ABNORMAL LOW (ref 3.5–5.0)
Alkaline Phosphatase: 125 U/L (ref 38–126)
Anion gap: 16 — ABNORMAL HIGH (ref 5–15)
BUN: 8 mg/dL (ref 6–20)
CO2: 20 mmol/L — ABNORMAL LOW (ref 22–32)
Calcium: 8.5 mg/dL — ABNORMAL LOW (ref 8.9–10.3)
Chloride: 100 mmol/L (ref 98–111)
Creatinine, Ser: 0.65 mg/dL (ref 0.61–1.24)
GFR calc Af Amer: >60 mL/min (ref >60)
GFR calc non Af Amer: >60 mL/min (ref >60)
Glucose, Bld: 123 mg/dL — ABNORMAL HIGH (ref 70–99)
Potassium: 3.6 mmol/L (ref 3.5–5.1)
Sodium: 136 mmol/L (ref 135–145)
Total Bilirubin: 0.5 mg/dL (ref 0.3–1.2)
Total Protein: 6.3 g/dL — ABNORMAL LOW (ref 6.5–8.1)

## 2019-05-26 LAB — TROPONIN I
Troponin I: <0.03 ng/mL (ref <0.03)
Troponin I: <0.03 ng/mL (ref <0.03)

## 2019-05-26 LAB — URINE CULTURE: Culture: 50000 — AB

## 2019-05-26 LAB — ETHANOL: Alcohol, Ethyl (B): 315 mg/dL (ref <10)

## 2019-05-26 LAB — RAPID URINE DRUG SCREEN, HOSP PERFORMED
Amphetamines: NOT DETECTED
Barbiturates: NOT DETECTED
Benzodiazepines: NOT DETECTED
Cocaine: NOT DETECTED
Opiates: NOT DETECTED
Tetrahydrocannabinol: NOT DETECTED

## 2019-05-26 LAB — CBC WITH DIFFERENTIAL/PLATELET
Abs Immature Granulocytes: 0.03 10*3/uL (ref 0.00–0.07)
Basophils Absolute: 0.1 10*3/uL (ref 0.0–0.1)
Basophils Relative: 1 %
Eosinophils Absolute: 0 10*3/uL (ref 0.0–0.5)
Eosinophils Relative: 0 %
HCT: 39 % (ref 39.0–52.0)
Hemoglobin: 13.3 g/dL (ref 13.0–17.0)
Immature Granulocytes: 0 %
Lymphocytes Relative: 49 %
Lymphs Abs: 4.3 10*3/uL — ABNORMAL HIGH (ref 0.7–4.0)
MCH: 32.4 pg (ref 26.0–34.0)
MCHC: 34.1 g/dL (ref 30.0–36.0)
MCV: 94.9 fL (ref 80.0–100.0)
Monocytes Absolute: 0.9 10*3/uL (ref 0.1–1.0)
Monocytes Relative: 10 %
Neutro Abs: 3.6 10*3/uL (ref 1.7–7.7)
Neutrophils Relative %: 40 %
Platelets: 156 10*3/uL (ref 150–400)
RBC: 4.11 MIL/uL — ABNORMAL LOW (ref 4.22–5.81)
RDW: 17.5 % — ABNORMAL HIGH (ref 11.5–15.5)
WBC: 9 10*3/uL (ref 4.0–10.5)
nRBC: 0 % (ref 0.0–0.2)

## 2019-05-26 LAB — LIPASE, BLOOD: Lipase: 32 U/L (ref 11–51)

## 2019-05-26 MED ORDER — SODIUM CHLORIDE 0.9 % IV BOLUS (SEPSIS)
1000.0000 mL | Freq: Once | INTRAVENOUS | Status: AC
  Administered 2019-05-26: 1000 mL via INTRAVENOUS

## 2019-05-26 MED ORDER — KETOROLAC TROMETHAMINE 30 MG/ML IJ SOLN
30.0000 mg | Freq: Once | INTRAMUSCULAR | Status: AC
  Administered 2019-05-26: 30 mg via INTRAVENOUS
  Filled 2019-05-26: qty 1

## 2019-05-26 MED ORDER — THIAMINE HCL 100 MG/ML IJ SOLN
100.0000 mg | Freq: Once | INTRAMUSCULAR | Status: AC
  Administered 2019-05-26: 100 mg via INTRAVENOUS
  Filled 2019-05-26: qty 2

## 2019-05-26 NOTE — ED Notes (Addendum)
Ambulated Pt from RM to Bathroom. Pt did [place] but Pt did Not Fall Down. Pt back in Bed safely
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
## 2019-05-26 NOTE — ED Notes (Signed)
Pt found at edge of bed. Pt has removed BP/SpO2/Cardiac monitor. Pt. Urinated all over floor. Pt. Refused to change out of street clothes into clean gown. Pt. Connected to cardiac monitor, given urinal, and informed to call staff when requiring assistance

## 2019-05-26 NOTE — ED Notes (Addendum)
PT ambulated down hall to restroom and ambulated out the door of ED without distress
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
## 2019-05-26 NOTE — Discharge Instructions (Signed)
Steps to find a Primary Care Provider (PCP): ° °Call [phone] or [phone] to access "[facility] Find a Doctor Service." ° °2.  You may also go on the [facility] website at [email or web address] ° °3.  [facility] also frequently accepts new patients. ° ° [facility]  °[address] °[place] [address] °[phone] ° °4.  There are also multiple [facility], [facility], [facility] and [facility] practices throughout the [place] that are frequently accepting new patients. You may find a clinic that is close to your home and contact them. ° °[facility] °[email or web address] °[phone] ° °[facility] °[email or web address] ° °[facility] °[email or web address] °[phone] ° °[facility] °[email or web address] °[phone] ° °5.  Local Health Departments also can provide primary care services. ° °[facility]  °[address] °[place] [address] °[phone] ° °[facility] °[address] °[place] [address] °[phone] ° °[facility] °[address]  °[place]  [address] °[phone] ° ° °

## 2019-05-26 NOTE — ED Notes (Signed)
Pt. Awake and meal given.

## 2019-05-27 ENCOUNTER — Emergency Department (HOSPITAL_COMMUNITY)
Admission: EM | Admit: 2019-05-27 | Discharge: 2019-05-28 | Disposition: A | Discharge status: Home / Self Care | Payer: Medicare Other | Attending: Emergency Medicine | Admitting: Emergency Medicine

## 2019-05-27 ENCOUNTER — Encounter (HOSPITAL_COMMUNITY): Payer: Self-pay | Admitting: Emergency Medicine

## 2019-05-27 ENCOUNTER — Other Ambulatory Visit: Payer: Self-pay

## 2019-05-27 DIAGNOSIS — Z79899 Other long term (current) drug therapy: Secondary | ICD-10-CM | POA: Insufficient documentation

## 2019-05-27 DIAGNOSIS — F1010 Alcohol abuse, uncomplicated: Secondary | ICD-10-CM | POA: Insufficient documentation

## 2019-05-27 DIAGNOSIS — R457 State of emotional shock and stress, unspecified: Secondary | ICD-10-CM | POA: Diagnosis not present

## 2019-05-27 DIAGNOSIS — F17210 Nicotine dependence, cigarettes, uncomplicated: Secondary | ICD-10-CM | POA: Insufficient documentation

## 2019-05-27 DIAGNOSIS — Z7982 Long term (current) use of aspirin: Secondary | ICD-10-CM | POA: Insufficient documentation

## 2019-05-27 DIAGNOSIS — I10 Essential (primary) hypertension: Secondary | ICD-10-CM | POA: Diagnosis not present

## 2019-05-27 DIAGNOSIS — J449 Chronic obstructive pulmonary disease, unspecified: Secondary | ICD-10-CM | POA: Insufficient documentation

## 2019-05-27 DIAGNOSIS — I2510 Atherosclerotic heart disease of native coronary artery without angina pectoris: Secondary | ICD-10-CM | POA: Diagnosis not present

## 2019-05-27 DIAGNOSIS — E119 Type 2 diabetes mellitus without complications: Secondary | ICD-10-CM | POA: Insufficient documentation

## 2019-05-27 DIAGNOSIS — R0789 Other chest pain: Secondary | ICD-10-CM | POA: Diagnosis not present

## 2019-05-27 DIAGNOSIS — R079 Chest pain, unspecified: Secondary | ICD-10-CM | POA: Insufficient documentation

## 2019-05-27 LAB — TROPONIN I: Troponin I: <0.03 ng/mL (ref <0.03)

## 2019-05-27 NOTE — ED Notes (Signed)
Bed: XY33 Expected date:  Expected time:  Means of arrival:  Comments: 25M chest pain

## 2019-05-27 NOTE — ED Triage Notes (Signed)
Pt arriving via [facility] with alcohol intoxication and claims his cardiac stents are "messed up".

## 2019-05-27 NOTE — ED Notes (Signed)
Pt sleeping. 

## 2019-05-27 NOTE — ED Notes (Signed)
Pt discharged with all belongings. Discharge instructions reviewed with pt, pt verbalized understanding. Pt ambulatory at discharge. Opportunity for questions provided.
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
## 2019-05-28 DIAGNOSIS — R079 Chest pain, unspecified: Secondary | ICD-10-CM | POA: Diagnosis not present

## 2019-05-28 LAB — I-STAT TROPONIN, ED: Troponin i, poc: 0.01 ng/mL (ref 0.00–0.08)

## 2019-05-28 LAB — TROPONIN I: Troponin I: <0.03 ng/mL (ref <0.03)

## 2019-05-28 MED ORDER — IBUPROFEN 200 MG PO TABS
400.0000 mg | ORAL_TABLET | Freq: Once | ORAL | Status: AC
  Administered 2019-05-28: 400 mg via ORAL
  Filled 2019-05-28: qty 2
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
## 2019-06-07 ENCOUNTER — Emergency Department (HOSPITAL_COMMUNITY)
Admission: EM | Admit: 2019-06-07 | Discharge: 2019-06-07 | Disposition: A | Discharge status: Home / Self Care | Payer: Medicare Other | Attending: Emergency Medicine | Admitting: Emergency Medicine

## 2019-06-07 ENCOUNTER — Encounter (HOSPITAL_COMMUNITY): Payer: Self-pay | Admitting: Emergency Medicine

## 2019-06-07 ENCOUNTER — Emergency Department (HOSPITAL_COMMUNITY): Payer: Medicare Other

## 2019-06-07 ENCOUNTER — Other Ambulatory Visit: Payer: Self-pay

## 2019-06-07 DIAGNOSIS — Z79899 Other long term (current) drug therapy: Secondary | ICD-10-CM | POA: Diagnosis not present

## 2019-06-07 DIAGNOSIS — I10 Essential (primary) hypertension: Secondary | ICD-10-CM | POA: Insufficient documentation

## 2019-06-07 DIAGNOSIS — I252 Old myocardial infarction: Secondary | ICD-10-CM | POA: Insufficient documentation

## 2019-06-07 DIAGNOSIS — Z20828 Contact with and (suspected) exposure to other viral communicable diseases: Secondary | ICD-10-CM | POA: Insufficient documentation

## 2019-06-07 DIAGNOSIS — Y929 Unspecified place or not applicable: Secondary | ICD-10-CM | POA: Insufficient documentation

## 2019-06-07 DIAGNOSIS — Z7984 Long term (current) use of oral hypoglycemic drugs: Secondary | ICD-10-CM | POA: Insufficient documentation

## 2019-06-07 DIAGNOSIS — E119 Type 2 diabetes mellitus without complications: Secondary | ICD-10-CM | POA: Diagnosis not present

## 2019-06-07 DIAGNOSIS — Z955 Presence of coronary angioplasty implant and graft: Secondary | ICD-10-CM | POA: Insufficient documentation

## 2019-06-07 DIAGNOSIS — I2510 Atherosclerotic heart disease of native coronary artery without angina pectoris: Secondary | ICD-10-CM | POA: Diagnosis not present

## 2019-06-07 DIAGNOSIS — X58XXXA Exposure to other specified factors, initial encounter: Secondary | ICD-10-CM | POA: Insufficient documentation

## 2019-06-07 DIAGNOSIS — F10920 Alcohol use, unspecified with intoxication, uncomplicated: Secondary | ICD-10-CM

## 2019-06-07 DIAGNOSIS — F10120 Alcohol abuse with intoxication, uncomplicated: Secondary | ICD-10-CM | POA: Diagnosis not present

## 2019-06-07 DIAGNOSIS — Y998 Other external cause status: Secondary | ICD-10-CM | POA: Diagnosis not present

## 2019-06-07 DIAGNOSIS — Y9389 Activity, other specified: Secondary | ICD-10-CM | POA: Diagnosis not present

## 2019-06-07 DIAGNOSIS — S2232XA Fracture of one rib, left side, initial encounter for closed fracture: Secondary | ICD-10-CM | POA: Insufficient documentation

## 2019-06-07 DIAGNOSIS — R42 Dizziness and giddiness: Secondary | ICD-10-CM | POA: Diagnosis not present

## 2019-06-07 DIAGNOSIS — R0602 Shortness of breath: Secondary | ICD-10-CM | POA: Diagnosis not present

## 2019-06-07 DIAGNOSIS — Z8249 Family history of ischemic heart disease and other diseases of the circulatory system: Secondary | ICD-10-CM | POA: Diagnosis not present

## 2019-06-07 DIAGNOSIS — J449 Chronic obstructive pulmonary disease, unspecified: Secondary | ICD-10-CM | POA: Diagnosis not present

## 2019-06-07 DIAGNOSIS — S299XXA Unspecified injury of thorax, initial encounter: Secondary | ICD-10-CM | POA: Diagnosis present

## 2019-06-07 DIAGNOSIS — F17210 Nicotine dependence, cigarettes, uncomplicated: Secondary | ICD-10-CM | POA: Insufficient documentation

## 2019-06-07 DIAGNOSIS — R0789 Other chest pain: Secondary | ICD-10-CM

## 2019-06-07 LAB — CBC WITH DIFFERENTIAL/PLATELET
Abs Immature Granulocytes: 0.01 10*3/uL (ref 0.00–0.07)
Basophils Absolute: 0.1 10*3/uL (ref 0.0–0.1)
Basophils Relative: 1 %
Eosinophils Absolute: 0 10*3/uL (ref 0.0–0.5)
Eosinophils Relative: 0 %
HCT: 38.3 % — ABNORMAL LOW (ref 39.0–52.0)
Hemoglobin: 13.4 g/dL (ref 13.0–17.0)
Immature Granulocytes: 0 %
Lymphocytes Relative: 52 %
Lymphs Abs: 2.8 10*3/uL (ref 0.7–4.0)
MCH: 33.8 pg (ref 26.0–34.0)
MCHC: 35 g/dL (ref 30.0–36.0)
MCV: 96.7 fL (ref 80.0–100.0)
Monocytes Absolute: 0.4 10*3/uL (ref 0.1–1.0)
Monocytes Relative: 7 %
Neutro Abs: 2.1 10*3/uL (ref 1.7–7.7)
Neutrophils Relative %: 40 %
Platelets: 118 10*3/uL — ABNORMAL LOW (ref 150–400)
RBC: 3.96 MIL/uL — ABNORMAL LOW (ref 4.22–5.81)
RDW: 18.1 % — ABNORMAL HIGH (ref 11.5–15.5)
WBC: 5.4 10*3/uL (ref 4.0–10.5)
nRBC: 0 % (ref 0.0–0.2)

## 2019-06-07 LAB — TROPONIN I
Troponin I: <0.03 ng/mL (ref <0.03)
Troponin I: <0.03 ng/mL (ref <0.03)

## 2019-06-07 LAB — BASIC METABOLIC PANEL
Anion gap: 11 (ref 5–15)
BUN: 10 mg/dL (ref 6–20)
CO2: 20 mmol/L — ABNORMAL LOW (ref 22–32)
Calcium: 7.7 mg/dL — ABNORMAL LOW (ref 8.9–10.3)
Chloride: 104 mmol/L (ref 98–111)
Creatinine, Ser: 0.56 mg/dL — ABNORMAL LOW (ref 0.61–1.24)
GFR calc Af Amer: >60 mL/min (ref >60)
GFR calc non Af Amer: >60 mL/min (ref >60)
Glucose, Bld: 115 mg/dL — ABNORMAL HIGH (ref 70–99)
Potassium: 3.4 mmol/L — ABNORMAL LOW (ref 3.5–5.1)
Sodium: 135 mmol/L (ref 135–145)

## 2019-06-07 LAB — SARS CORONAVIRUS 2 BY RT PCR (HOSPITAL ORDER, PERFORMED IN ~~LOC~~ HOSPITAL LAB): SARS Coronavirus 2: NEGATIVE

## 2019-06-07 LAB — ETHANOL: Alcohol, Ethyl (B): 311 mg/dL (ref <10)

## 2019-06-07 MED ORDER — MECLIZINE HCL 25 MG PO TABS
25.0000 mg | ORAL_TABLET | Freq: Once | ORAL | Status: AC
  Administered 2019-06-07: 25 mg via ORAL
  Filled 2019-06-07: qty 1

## 2019-06-07 NOTE — ED Notes (Signed)
Patient transported to CT scan . 

## 2019-06-07 NOTE — ED Notes (Signed)
Belongings placed in locker 32 with label.

## 2019-06-07 NOTE — ED Notes (Addendum)
Pt ambulated to BR with x1 assistance with an unsteady gait. Pt complaining of the "room spinning" while ambulating but did not complain of any shortness of breath while ambulating. Pt back in bed at this time with bed in the lowest position and call bell within reach.

## 2019-06-07 NOTE — ED Notes (Signed)
Attempted to ambulate patient.  He was able to stand with assistance, but was unable to walk saying that he felt dizzy like the room was spinning.  Patient became unsteady with nurse holding on to his arm and had to be assisted back to the bed.

## 2019-06-07 NOTE — ED Notes (Signed)
Patient sleeping. Denies any needs at this time. Will continue to monitor patient.

## 2019-06-07 NOTE — Patient Outreach (Signed)
[facility]PSS met with the patient in order to provide substance use recovery support and help with getting connected to substance use treatment resources. Patient reports a history of alcohol addiction and reports drinking beer daily. Patient did not specify how much beer the patient drinks daily. Patient states he currently is living in a motel. CPSS provided information for several different substance use recovery resources including a detox substance use treatment center list, residential/outpatient substance use treatment center list, homeless outreach resource information, and CPSS contact information. CPSS explained in depth on how to follow up with each substance use recovery resource. Patient was pleasant and expressed gratitude to [facility] for the [facility]PSS help with substance use recovery resources. CPSS strongly encouraged the patient to stay in contact with [facility]PSS if needed for further substance use recovery support after discharge from [facility]. CPSS also informed the patient to not hesitate in contacting [facility]PSS for further help with getting connected to substance use recovery resources if needed after discharge from the [facility].

## 2019-06-07 NOTE — ED Notes (Signed)
Patient ambulated to restroom with an unsteady gait and required assistance. Patient states "the damn room keeps spinning, y'all need to stop moving." Assisted patient to bed safely and placed the side rails up. Instructed patient to not keep up without assistance. Bed locked and in lowest position. Will continue to monitor patient.

## 2019-06-07 NOTE — ED Notes (Signed)
Patient refused to take his discharge instructions.  "Why are you all kicking me out so late." " It will be dark soon."

## 2019-06-07 NOTE — Discharge Instructions (Signed)
You may rotate Tylenol and Motrin for your chest wall pain.  Use the incentive spirometer as directed.  Please follow up with your primary care provider within 5-7 days for re-evaluation of your symptoms. If you do not have a primary care provider, information for a healthcare clinic has been provided for you to make arrangements for follow up care. Please return to the emergency department for any new or worsening symptoms.

## 2019-06-07 NOTE — ED Triage Notes (Signed)
Patient is complaining of EToh and sob. Patient is talking with no difficulty.
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
## 2019-06-07 NOTE — ED Notes (Signed)
Patient arrived in [place].  Alert and oriented, calm and cooperative able to converse with nurse.

## 2019-06-10 ENCOUNTER — Emergency Department (HOSPITAL_COMMUNITY)
Admission: EM | Admit: 2019-06-10 | Discharge: 2019-06-10 | Disposition: A | Discharge status: Home / Self Care | Payer: Medicare Other | Attending: Emergency Medicine | Admitting: Emergency Medicine

## 2019-06-10 DIAGNOSIS — Z5320 Procedure and treatment not carried out because of patient's decision for unspecified reasons: Secondary | ICD-10-CM | POA: Insufficient documentation

## 2019-06-10 NOTE — ED Notes (Signed)
Registration saw Pt leave upset and yelling at staff. Pt was not found on premise when trying to triage

## 2019-06-25 ENCOUNTER — Other Ambulatory Visit: Payer: Self-pay

## 2019-06-25 ENCOUNTER — Emergency Department (HOSPITAL_COMMUNITY): Payer: Medicare Other

## 2019-06-25 ENCOUNTER — Inpatient Hospital Stay (HOSPITAL_COMMUNITY)
Admission: EM | Admit: 2019-06-25 | Discharge: 2019-06-29 | DRG: 178 | Discharge status: Against Medical Advice | Payer: Medicare Other | Attending: Internal Medicine | Admitting: Internal Medicine

## 2019-06-25 DIAGNOSIS — R410 Disorientation, unspecified: Secondary | ICD-10-CM | POA: Diagnosis not present

## 2019-06-25 DIAGNOSIS — R0902 Hypoxemia: Secondary | ICD-10-CM | POA: Diagnosis not present

## 2019-06-25 DIAGNOSIS — K810 Acute cholecystitis: Secondary | ICD-10-CM

## 2019-06-25 DIAGNOSIS — J690 Pneumonitis due to inhalation of food and vomit: Secondary | ICD-10-CM | POA: Diagnosis not present

## 2019-06-25 DIAGNOSIS — I959 Hypotension, unspecified: Secondary | ICD-10-CM | POA: Diagnosis present

## 2019-06-25 DIAGNOSIS — S3993XA Unspecified injury of pelvis, initial encounter: Secondary | ICD-10-CM | POA: Diagnosis not present

## 2019-06-25 DIAGNOSIS — J449 Chronic obstructive pulmonary disease, unspecified: Secondary | ICD-10-CM | POA: Diagnosis present

## 2019-06-25 DIAGNOSIS — Z590 Homelessness: Secondary | ICD-10-CM

## 2019-06-25 DIAGNOSIS — F17210 Nicotine dependence, cigarettes, uncomplicated: Secondary | ICD-10-CM | POA: Diagnosis present

## 2019-06-25 DIAGNOSIS — R52 Pain, unspecified: Secondary | ICD-10-CM | POA: Diagnosis not present

## 2019-06-25 DIAGNOSIS — S0990XA Unspecified injury of head, initial encounter: Secondary | ICD-10-CM | POA: Diagnosis not present

## 2019-06-25 DIAGNOSIS — Z955 Presence of coronary angioplasty implant and graft: Secondary | ICD-10-CM

## 2019-06-25 DIAGNOSIS — S299XXA Unspecified injury of thorax, initial encounter: Secondary | ICD-10-CM | POA: Diagnosis not present

## 2019-06-25 DIAGNOSIS — M545 Low back pain: Secondary | ICD-10-CM | POA: Diagnosis not present

## 2019-06-25 DIAGNOSIS — E118 Type 2 diabetes mellitus with unspecified complications: Secondary | ICD-10-CM | POA: Diagnosis present

## 2019-06-25 DIAGNOSIS — S3991XA Unspecified injury of abdomen, initial encounter: Secondary | ICD-10-CM | POA: Diagnosis not present

## 2019-06-25 DIAGNOSIS — I10 Essential (primary) hypertension: Secondary | ICD-10-CM | POA: Diagnosis present

## 2019-06-25 DIAGNOSIS — R220 Localized swelling, mass and lump, head: Secondary | ICD-10-CM | POA: Diagnosis not present

## 2019-06-25 DIAGNOSIS — R404 Transient alteration of awareness: Secondary | ICD-10-CM | POA: Diagnosis not present

## 2019-06-25 DIAGNOSIS — D638 Anemia in other chronic diseases classified elsewhere: Secondary | ICD-10-CM | POA: Diagnosis present

## 2019-06-25 DIAGNOSIS — F1024 Alcohol dependence with alcohol-induced mood disorder: Secondary | ICD-10-CM | POA: Diagnosis present

## 2019-06-25 DIAGNOSIS — D6959 Other secondary thrombocytopenia: Secondary | ICD-10-CM | POA: Diagnosis present

## 2019-06-25 DIAGNOSIS — D649 Anemia, unspecified: Secondary | ICD-10-CM

## 2019-06-25 DIAGNOSIS — Z03818 Encounter for observation for suspected exposure to other biological agents ruled out: Secondary | ICD-10-CM | POA: Diagnosis not present

## 2019-06-25 DIAGNOSIS — I2510 Atherosclerotic heart disease of native coronary artery without angina pectoris: Secondary | ICD-10-CM | POA: Diagnosis present

## 2019-06-25 DIAGNOSIS — R000 Tachycardia, unspecified: Secondary | ICD-10-CM | POA: Diagnosis not present

## 2019-06-25 DIAGNOSIS — Z1159 Encounter for screening for other viral diseases: Secondary | ICD-10-CM

## 2019-06-25 DIAGNOSIS — E119 Type 2 diabetes mellitus without complications: Secondary | ICD-10-CM | POA: Diagnosis present

## 2019-06-25 DIAGNOSIS — Z79899 Other long term (current) drug therapy: Secondary | ICD-10-CM

## 2019-06-25 DIAGNOSIS — K824 Cholesterolosis of gallbladder: Secondary | ICD-10-CM | POA: Diagnosis not present

## 2019-06-25 DIAGNOSIS — K529 Noninfective gastroenteritis and colitis, unspecified: Secondary | ICD-10-CM | POA: Diagnosis present

## 2019-06-25 DIAGNOSIS — Z833 Family history of diabetes mellitus: Secondary | ICD-10-CM

## 2019-06-25 DIAGNOSIS — I252 Old myocardial infarction: Secondary | ICD-10-CM

## 2019-06-25 DIAGNOSIS — E876 Hypokalemia: Secondary | ICD-10-CM | POA: Diagnosis present

## 2019-06-25 MED ORDER — SODIUM CHLORIDE 0.9 % IV SOLN
1000.0000 mL | INTRAVENOUS | Status: DC
  Administered 2019-06-26: 1000 mL via INTRAVENOUS

## 2019-06-25 MED ORDER — SODIUM CHLORIDE 0.9 % IV BOLUS (SEPSIS)
1000.0000 mL | Freq: Once | INTRAVENOUS | Status: AC
  Administered 2019-06-25: 1000 mL via INTRAVENOUS

## 2019-06-25 NOTE — ED Notes (Signed)
Bed: WA22 Expected date:  Expected time:  Means of arrival:  Comments: EMS 
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
## 2019-06-26 ENCOUNTER — Emergency Department (HOSPITAL_COMMUNITY): Payer: Medicare Other

## 2019-06-26 ENCOUNTER — Telehealth: Payer: Self-pay

## 2019-06-26 ENCOUNTER — Encounter (HOSPITAL_COMMUNITY): Payer: Self-pay

## 2019-06-26 ENCOUNTER — Inpatient Hospital Stay (HOSPITAL_COMMUNITY): Payer: Medicare Other

## 2019-06-26 DIAGNOSIS — J690 Pneumonitis due to inhalation of food and vomit: Secondary | ICD-10-CM | POA: Diagnosis not present

## 2019-06-26 DIAGNOSIS — M545 Low back pain: Secondary | ICD-10-CM | POA: Diagnosis not present

## 2019-06-26 DIAGNOSIS — R220 Localized swelling, mass and lump, head: Secondary | ICD-10-CM | POA: Diagnosis not present

## 2019-06-26 DIAGNOSIS — S0990XA Unspecified injury of head, initial encounter: Secondary | ICD-10-CM | POA: Diagnosis not present

## 2019-06-26 DIAGNOSIS — S3991XA Unspecified injury of abdomen, initial encounter: Secondary | ICD-10-CM | POA: Diagnosis not present

## 2019-06-26 DIAGNOSIS — I2510 Atherosclerotic heart disease of native coronary artery without angina pectoris: Secondary | ICD-10-CM

## 2019-06-26 DIAGNOSIS — E118 Type 2 diabetes mellitus with unspecified complications: Secondary | ICD-10-CM

## 2019-06-26 DIAGNOSIS — R1011 Right upper quadrant pain: Secondary | ICD-10-CM | POA: Diagnosis not present

## 2019-06-26 DIAGNOSIS — S299XXA Unspecified injury of thorax, initial encounter: Secondary | ICD-10-CM | POA: Diagnosis not present

## 2019-06-26 DIAGNOSIS — J449 Chronic obstructive pulmonary disease, unspecified: Secondary | ICD-10-CM | POA: Diagnosis present

## 2019-06-26 DIAGNOSIS — F17210 Nicotine dependence, cigarettes, uncomplicated: Secondary | ICD-10-CM | POA: Diagnosis present

## 2019-06-26 DIAGNOSIS — F1024 Alcohol dependence with alcohol-induced mood disorder: Secondary | ICD-10-CM

## 2019-06-26 DIAGNOSIS — Z590 Homelessness: Secondary | ICD-10-CM | POA: Diagnosis not present

## 2019-06-26 DIAGNOSIS — Z79899 Other long term (current) drug therapy: Secondary | ICD-10-CM | POA: Diagnosis not present

## 2019-06-26 DIAGNOSIS — Z955 Presence of coronary angioplasty implant and graft: Secondary | ICD-10-CM | POA: Diagnosis not present

## 2019-06-26 DIAGNOSIS — K810 Acute cholecystitis: Secondary | ICD-10-CM

## 2019-06-26 DIAGNOSIS — E876 Hypokalemia: Secondary | ICD-10-CM | POA: Diagnosis not present

## 2019-06-26 DIAGNOSIS — E119 Type 2 diabetes mellitus without complications: Secondary | ICD-10-CM | POA: Diagnosis present

## 2019-06-26 DIAGNOSIS — R197 Diarrhea, unspecified: Secondary | ICD-10-CM | POA: Diagnosis not present

## 2019-06-26 DIAGNOSIS — I959 Hypotension, unspecified: Secondary | ICD-10-CM | POA: Diagnosis present

## 2019-06-26 DIAGNOSIS — K824 Cholesterolosis of gallbladder: Secondary | ICD-10-CM | POA: Diagnosis not present

## 2019-06-26 DIAGNOSIS — K529 Noninfective gastroenteritis and colitis, unspecified: Secondary | ICD-10-CM | POA: Diagnosis present

## 2019-06-26 DIAGNOSIS — Z833 Family history of diabetes mellitus: Secondary | ICD-10-CM | POA: Diagnosis not present

## 2019-06-26 DIAGNOSIS — Z1159 Encounter for screening for other viral diseases: Secondary | ICD-10-CM | POA: Diagnosis not present

## 2019-06-26 DIAGNOSIS — I10 Essential (primary) hypertension: Secondary | ICD-10-CM | POA: Diagnosis present

## 2019-06-26 DIAGNOSIS — D649 Anemia, unspecified: Secondary | ICD-10-CM | POA: Diagnosis not present

## 2019-06-26 DIAGNOSIS — I252 Old myocardial infarction: Secondary | ICD-10-CM | POA: Diagnosis not present

## 2019-06-26 DIAGNOSIS — S3993XA Unspecified injury of pelvis, initial encounter: Secondary | ICD-10-CM | POA: Diagnosis not present

## 2019-06-26 DIAGNOSIS — D6959 Other secondary thrombocytopenia: Secondary | ICD-10-CM | POA: Diagnosis present

## 2019-06-26 DIAGNOSIS — D638 Anemia in other chronic diseases classified elsewhere: Secondary | ICD-10-CM | POA: Diagnosis not present

## 2019-06-26 LAB — RETICULOCYTES
Immature Retic Fract: 30.3 % — ABNORMAL HIGH (ref 2.3–15.9)
RBC.: 2.76 MIL/uL — ABNORMAL LOW (ref 4.22–5.81)
Retic Count, Absolute: 100.2 10*3/uL (ref 19.0–186.0)
Retic Ct Pct: 3.6 % — ABNORMAL HIGH (ref 0.4–3.1)

## 2019-06-26 LAB — COMPREHENSIVE METABOLIC PANEL
ALT: 22 U/L (ref 0–44)
AST: 43 U/L — ABNORMAL HIGH (ref 15–41)
Albumin: 2.3 g/dL — ABNORMAL LOW (ref 3.5–5.0)
Alkaline Phosphatase: 163 U/L — ABNORMAL HIGH (ref 38–126)
Anion gap: 10 (ref 5–15)
BUN: 5 mg/dL — ABNORMAL LOW (ref 6–20)
CO2: 19 mmol/L — ABNORMAL LOW (ref 22–32)
Calcium: 8 mg/dL — ABNORMAL LOW (ref 8.9–10.3)
Chloride: 104 mmol/L (ref 98–111)
Creatinine, Ser: 0.62 mg/dL (ref 0.61–1.24)
GFR calc Af Amer: >60 mL/min (ref >60)
GFR calc non Af Amer: >60 mL/min (ref >60)
Glucose, Bld: 97 mg/dL (ref 70–99)
Potassium: 3 mmol/L — ABNORMAL LOW (ref 3.5–5.1)
Sodium: 133 mmol/L — ABNORMAL LOW (ref 135–145)
Total Bilirubin: 0.5 mg/dL (ref 0.3–1.2)
Total Protein: 5.2 g/dL — ABNORMAL LOW (ref 6.5–8.1)

## 2019-06-26 LAB — MAGNESIUM
Magnesium: 1.5 mg/dL — ABNORMAL LOW (ref 1.7–2.4)
Magnesium: 1.4 mg/dL — ABNORMAL LOW (ref 1.7–2.4)

## 2019-06-26 LAB — CBC
HCT: 26.6 % — ABNORMAL LOW (ref 39.0–52.0)
HCT: 27 % — ABNORMAL LOW (ref 39.0–52.0)
Hemoglobin: 8.4 g/dL — ABNORMAL LOW (ref 13.0–17.0)
Hemoglobin: 9 g/dL — ABNORMAL LOW (ref 13.0–17.0)
MCH: 34.4 pg — ABNORMAL HIGH (ref 26.0–34.0)
MCH: 35.9 pg — ABNORMAL HIGH (ref 26.0–34.0)
MCHC: 31.6 g/dL (ref 30.0–36.0)
MCHC: 33.3 g/dL (ref 30.0–36.0)
MCV: 107.6 fL — ABNORMAL HIGH (ref 80.0–100.0)
MCV: 109 fL — ABNORMAL HIGH (ref 80.0–100.0)
Platelets: 71 10*3/uL — ABNORMAL LOW (ref 150–400)
Platelets: 72 10*3/uL — ABNORMAL LOW (ref 150–400)
RBC: 2.44 MIL/uL — ABNORMAL LOW (ref 4.22–5.81)
RBC: 2.51 MIL/uL — ABNORMAL LOW (ref 4.22–5.81)
RDW: 16.7 % — ABNORMAL HIGH (ref 11.5–15.5)
RDW: 16.7 % — ABNORMAL HIGH (ref 11.5–15.5)
WBC: 6.5 10*3/uL (ref 4.0–10.5)
WBC: 9.1 10*3/uL (ref 4.0–10.5)
nRBC: 0.8 % — ABNORMAL HIGH (ref 0.0–0.2)
nRBC: 1 % — ABNORMAL HIGH (ref 0.0–0.2)

## 2019-06-26 LAB — C DIFFICILE QUICK SCREEN W PCR REFLEX
C Diff antigen: NEGATIVE
C Diff interpretation: NOT DETECTED
C Diff toxin: NEGATIVE

## 2019-06-26 LAB — IRON AND TIBC
Iron: 109 ug/dL (ref 45–182)
Saturation Ratios: 74 % — ABNORMAL HIGH (ref 17.9–39.5)
TIBC: 147 ug/dL — ABNORMAL LOW (ref 250–450)
UIBC: 38 ug/dL

## 2019-06-26 LAB — GLUCOSE, CAPILLARY
Glucose-Capillary: 96 mg/dL (ref 70–99)
Glucose-Capillary: 141 mg/dL — ABNORMAL HIGH (ref 70–99)
Glucose-Capillary: 137 mg/dL — ABNORMAL HIGH (ref 70–99)

## 2019-06-26 LAB — FERRITIN: Ferritin: 562 ng/mL — ABNORMAL HIGH (ref 24–336)

## 2019-06-26 LAB — HEMATOCRIT: HCT: 30 % — ABNORMAL LOW (ref 39.0–52.0)

## 2019-06-26 LAB — POC OCCULT BLOOD, ED: Fecal Occult Bld: NEGATIVE

## 2019-06-26 LAB — STREP PNEUMONIAE URINARY ANTIGEN: Strep Pneumo Urinary Antigen: NEGATIVE

## 2019-06-26 LAB — TSH: TSH: 2.604 u[IU]/mL (ref 0.350–4.500)

## 2019-06-26 LAB — SARS CORONAVIRUS 2 BY RT PCR (HOSPITAL ORDER, PERFORMED IN ~~LOC~~ HOSPITAL LAB): SARS Coronavirus 2: NEGATIVE

## 2019-06-26 LAB — TRANSFERRIN: Transferrin: 105 mg/dL — ABNORMAL LOW (ref 180–329)

## 2019-06-26 LAB — VITAMIN B12: Vitamin B-12: 337 pg/mL (ref 180–914)

## 2019-06-26 LAB — PROCALCITONIN: Procalcitonin: 0.3 ng/mL

## 2019-06-26 MED ORDER — VITAMIN B-1 100 MG PO TABS
100.0000 mg | ORAL_TABLET | Freq: Every day | ORAL | Status: DC
  Administered 2019-06-26 – 2019-06-28 (×3): 100 mg via ORAL
  Filled 2019-06-26 (×4): qty 1

## 2019-06-26 MED ORDER — ALBUTEROL SULFATE (2.5 MG/3ML) 0.083% IN NEBU: 3.0000 mL | INHALATION_SOLUTION | Freq: Four times a day (QID) | RESPIRATORY_TRACT | Status: DC | PRN

## 2019-06-26 MED ORDER — LORAZEPAM 1 MG PO TABS
1.0000 mg | ORAL_TABLET | Freq: Four times a day (QID) | ORAL | Status: DC | PRN
  Administered 2019-06-26: 1 mg via ORAL
  Filled 2019-06-26: qty 1

## 2019-06-26 MED ORDER — SODIUM CHLORIDE (PF) 0.9 % IJ SOLN
INTRAMUSCULAR | Status: AC
  Filled 2019-06-26: qty 50

## 2019-06-26 MED ORDER — THIAMINE HCL 100 MG/ML IJ SOLN: 100.0000 mg | Freq: Every day | INTRAMUSCULAR | Status: DC

## 2019-06-26 MED ORDER — ADULT MULTIVITAMIN W/MINERALS CH
1.0000 | ORAL_TABLET | Freq: Every day | ORAL | Status: DC
  Administered 2019-06-26 – 2019-06-28 (×3): 1 via ORAL
  Filled 2019-06-26 (×4): qty 1

## 2019-06-26 MED ORDER — FOLIC ACID 1 MG PO TABS
1.0000 mg | ORAL_TABLET | Freq: Every day | ORAL | Status: DC
  Administered 2019-06-26 – 2019-06-28 (×3): 1 mg via ORAL
  Filled 2019-06-26 (×4): qty 1

## 2019-06-26 MED ORDER — ACETAMINOPHEN 325 MG PO TABS
650.0000 mg | ORAL_TABLET | Freq: Four times a day (QID) | ORAL | Status: DC | PRN
  Administered 2019-06-27 – 2019-06-28 (×3): 650 mg via ORAL
  Filled 2019-06-26 (×3): qty 2

## 2019-06-26 MED ORDER — IOHEXOL 300 MG/ML  SOLN
100.0000 mL | Freq: Once | INTRAMUSCULAR | Status: AC | PRN
  Administered 2019-06-26: 100 mL via INTRAVENOUS

## 2019-06-26 MED ORDER — TICAGRELOR 90 MG PO TABS
90.0000 mg | ORAL_TABLET | Freq: Two times a day (BID) | ORAL | Status: DC
  Administered 2019-06-26 – 2019-06-28 (×6): 90 mg via ORAL
  Filled 2019-06-26 (×7): qty 1

## 2019-06-26 MED ORDER — LORAZEPAM 2 MG/ML IJ SOLN: 1.0000 mg | Freq: Four times a day (QID) | INTRAMUSCULAR | Status: DC | PRN

## 2019-06-26 MED ORDER — METOPROLOL TARTRATE 50 MG PO TABS
50.0000 mg | ORAL_TABLET | Freq: Two times a day (BID) | ORAL | Status: DC
  Administered 2019-06-26 – 2019-06-28 (×6): 50 mg via ORAL
  Filled 2019-06-26 (×7): qty 1

## 2019-06-26 MED ORDER — PIPERACILLIN-TAZOBACTAM 3.375 G IVPB
3.3750 g | Freq: Three times a day (TID) | INTRAVENOUS | Status: DC
  Administered 2019-06-26 – 2019-06-28 (×7): 3.375 g via INTRAVENOUS
  Filled 2019-06-26 (×6): qty 50

## 2019-06-26 MED ORDER — INSULIN ASPART 100 UNIT/ML ~~LOC~~ SOLN: 0.0000 [IU] | Freq: Every day | SUBCUTANEOUS | Status: DC

## 2019-06-26 MED ORDER — TECHNETIUM TC 99M MEBROFENIN IV KIT
5.1000 | PACK | Freq: Once | INTRAVENOUS | Status: AC
  Administered 2019-06-26: 5.1 via INTRAVENOUS

## 2019-06-26 MED ORDER — ATORVASTATIN CALCIUM 40 MG PO TABS
80.0000 mg | ORAL_TABLET | Freq: Every day | ORAL | Status: DC
  Administered 2019-06-26 – 2019-06-28 (×3): 80 mg via ORAL
  Filled 2019-06-26 (×3): qty 2

## 2019-06-26 MED ORDER — ACETAMINOPHEN 650 MG RE SUPP: 650.0000 mg | Freq: Four times a day (QID) | RECTAL | Status: DC | PRN

## 2019-06-26 MED ORDER — ONDANSETRON HCL 4 MG/2ML IJ SOLN: 4.0000 mg | Freq: Four times a day (QID) | INTRAMUSCULAR | Status: DC | PRN

## 2019-06-26 MED ORDER — MAGNESIUM SULFATE 2 GM/50ML IV SOLN
2.0000 g | Freq: Once | INTRAVENOUS | Status: AC
  Administered 2019-06-26: 2 g via INTRAVENOUS
  Filled 2019-06-26: qty 50

## 2019-06-26 MED ORDER — PANTOPRAZOLE SODIUM 40 MG IV SOLR: 40.0000 mg | INTRAVENOUS | Status: DC

## 2019-06-26 MED ORDER — INSULIN ASPART 100 UNIT/ML ~~LOC~~ SOLN
0.0000 [IU] | Freq: Three times a day (TID) | SUBCUTANEOUS | Status: DC
  Administered 2019-06-26: 2 [IU] via SUBCUTANEOUS

## 2019-06-26 MED ORDER — PANTOPRAZOLE SODIUM 40 MG PO TBEC
40.0000 mg | DELAYED_RELEASE_TABLET | Freq: Every day | ORAL | Status: DC
  Administered 2019-06-26 – 2019-06-28 (×3): 40 mg via ORAL
  Filled 2019-06-26 (×4): qty 1

## 2019-06-26 MED ORDER — POTASSIUM CHLORIDE IN NACL 40-0.9 MEQ/L-% IV SOLN
INTRAVENOUS | Status: DC
  Administered 2019-06-26 – 2019-06-27 (×3): 125 mL/h via INTRAVENOUS
  Filled 2019-06-26 (×5): qty 1000

## 2019-06-26 MED ORDER — SERTRALINE HCL 100 MG PO TABS
100.0000 mg | ORAL_TABLET | Freq: Every day | ORAL | Status: DC
  Administered 2019-06-26 – 2019-06-28 (×3): 100 mg via ORAL
  Filled 2019-06-26 (×5): qty 1

## 2019-06-26 MED ORDER — MECLIZINE HCL 25 MG PO TABS: 25.0000 mg | ORAL_TABLET | Freq: Three times a day (TID) | ORAL | Status: DC | PRN

## 2019-06-26 MED ORDER — POTASSIUM CHLORIDE CRYS ER 20 MEQ PO TBCR
40.0000 meq | EXTENDED_RELEASE_TABLET | Freq: Once | ORAL | Status: AC
  Administered 2019-06-26: 40 meq via ORAL
  Filled 2019-06-26: qty 2

## 2019-06-26 MED ORDER — MOMETASONE FURO-FORMOTEROL FUM 200-5 MCG/ACT IN AERO
2.0000 | INHALATION_SPRAY | Freq: Two times a day (BID) | RESPIRATORY_TRACT | Status: DC
  Administered 2019-06-26 – 2019-06-29 (×7): 2 via RESPIRATORY_TRACT
  Filled 2019-06-26: qty 8.8

## 2019-06-26 MED ORDER — NITROGLYCERIN 0.4 MG SL SUBL: 0.4000 mg | SUBLINGUAL_TABLET | SUBLINGUAL | Status: DC | PRN

## 2019-06-26 MED ORDER — GABAPENTIN 300 MG PO CAPS
300.0000 mg | ORAL_CAPSULE | Freq: Two times a day (BID) | ORAL | Status: DC
  Administered 2019-06-26 – 2019-06-28 (×6): 300 mg via ORAL
  Filled 2019-06-26 (×7): qty 1

## 2019-06-26 MED ORDER — ONDANSETRON HCL 4 MG PO TABS: 4.0000 mg | ORAL_TABLET | Freq: Four times a day (QID) | ORAL | Status: DC | PRN

## 2019-06-26 NOTE — ED Notes (Signed)
Pt removed cardiac monitoring and blood pressure cuff. Refusing to let writer put it back on. Pt continually saying "where is that damn sandwich and ice cream".
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
## 2019-06-26 NOTE — ED Notes (Signed)
Pt transported to CT ?

## 2019-06-26 NOTE — ED Notes (Signed)
Pt given sandwich and drink.

## 2019-06-26 NOTE — ED Notes (Signed)
US at bedside

## 2019-06-26 NOTE — ED Notes (Signed)
Pt requesting food at this time
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
## 2019-06-26 NOTE — Telephone Encounter (Signed)
Message received from Purcell Mouton, RN CM requesting a hospital follow up appointment for patient at [facility]. Informed her that an appointment has been scheduled for 07/05/2019 @ 1500

## 2019-06-27 DIAGNOSIS — E876 Hypokalemia: Secondary | ICD-10-CM

## 2019-06-27 DIAGNOSIS — J690 Pneumonitis due to inhalation of food and vomit: Principal | ICD-10-CM

## 2019-06-27 DIAGNOSIS — D649 Anemia, unspecified: Secondary | ICD-10-CM

## 2019-06-27 LAB — GASTROINTESTINAL PANEL BY PCR, STOOL (REPLACES STOOL CULTURE)

## 2019-06-27 LAB — CBC
HCT: 26.9 % — ABNORMAL LOW (ref 39.0–52.0)
Hemoglobin: 8.5 g/dL — ABNORMAL LOW (ref 13.0–17.0)
MCH: 35.4 pg — ABNORMAL HIGH (ref 26.0–34.0)
MCHC: 31.6 g/dL (ref 30.0–36.0)
MCV: 112.1 fL — ABNORMAL HIGH (ref 80.0–100.0)
Platelets: 87 10*3/uL — ABNORMAL LOW (ref 150–400)
RBC: 2.4 MIL/uL — ABNORMAL LOW (ref 4.22–5.81)
RDW: 18.1 % — ABNORMAL HIGH (ref 11.5–15.5)
WBC: 6.8 10*3/uL (ref 4.0–10.5)
nRBC: 0.9 % — ABNORMAL HIGH (ref 0.0–0.2)

## 2019-06-27 LAB — GLUCOSE, CAPILLARY
Glucose-Capillary: 122 mg/dL — ABNORMAL HIGH (ref 70–99)
Glucose-Capillary: 98 mg/dL (ref 70–99)
Glucose-Capillary: 92 mg/dL (ref 70–99)
Glucose-Capillary: 88 mg/dL (ref 70–99)

## 2019-06-27 LAB — COMPREHENSIVE METABOLIC PANEL
ALT: 21 U/L (ref 0–44)
AST: 38 U/L (ref 15–41)
Albumin: 2.4 g/dL — ABNORMAL LOW (ref 3.5–5.0)
Alkaline Phosphatase: 137 U/L — ABNORMAL HIGH (ref 38–126)
Anion gap: 5 (ref 5–15)
BUN: 5 mg/dL — ABNORMAL LOW (ref 6–20)
CO2: 21 mmol/L — ABNORMAL LOW (ref 22–32)
Calcium: 8 mg/dL — ABNORMAL LOW (ref 8.9–10.3)
Chloride: 113 mmol/L — ABNORMAL HIGH (ref 98–111)
Creatinine, Ser: 0.43 mg/dL — ABNORMAL LOW (ref 0.61–1.24)
GFR calc Af Amer: >60 mL/min (ref >60)
GFR calc non Af Amer: >60 mL/min (ref >60)
Glucose, Bld: 102 mg/dL — ABNORMAL HIGH (ref 70–99)
Potassium: 3.8 mmol/L (ref 3.5–5.1)
Sodium: 139 mmol/L (ref 135–145)
Total Bilirubin: 0.7 mg/dL (ref 0.3–1.2)
Total Protein: 5.2 g/dL — ABNORMAL LOW (ref 6.5–8.1)

## 2019-06-27 LAB — MAGNESIUM: Magnesium: 1.7 mg/dL (ref 1.7–2.4)

## 2019-06-27 LAB — FOLATE RBC
Folate, Hemolysate: 327 ng/mL
Folate, RBC: 1164 ng/mL (ref >498)
Hematocrit: 28.1 % — ABNORMAL LOW (ref 37.5–51.0)

## 2019-06-27 LAB — LEGIONELLA PNEUMOPHILA SEROGP 1 UR AG: L. pneumophila Serogp 1 Ur Ag: NEGATIVE

## 2019-06-27 MED ORDER — MORPHINE SULFATE (PF) 2 MG/ML IV SOLN
1.0000 mg | INTRAVENOUS | Status: DC | PRN
  Administered 2019-06-27 (×2): 1 mg via INTRAVENOUS
  Filled 2019-06-27 (×2): qty 1

## 2019-06-27 MED ORDER — LOPERAMIDE HCL 2 MG PO CAPS
2.0000 mg | ORAL_CAPSULE | Freq: Two times a day (BID) | ORAL | Status: DC | PRN
  Administered 2019-06-27 – 2019-06-28 (×2): 2 mg via ORAL
  Filled 2019-06-27 (×2): qty 1
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
## 2019-06-28 DIAGNOSIS — D638 Anemia in other chronic diseases classified elsewhere: Secondary | ICD-10-CM

## 2019-06-28 DIAGNOSIS — R197 Diarrhea, unspecified: Secondary | ICD-10-CM

## 2019-06-28 LAB — BASIC METABOLIC PANEL
Anion gap: 7 (ref 5–15)
BUN: <5 mg/dL — ABNORMAL LOW (ref 6–20)
CO2: 23 mmol/L (ref 22–32)
Calcium: 8.5 mg/dL — ABNORMAL LOW (ref 8.9–10.3)
Chloride: 106 mmol/L (ref 98–111)
Creatinine, Ser: 0.46 mg/dL — ABNORMAL LOW (ref 0.61–1.24)
GFR calc Af Amer: >60 mL/min (ref >60)
GFR calc non Af Amer: >60 mL/min (ref >60)
Glucose, Bld: 96 mg/dL (ref 70–99)
Potassium: 4.1 mmol/L (ref 3.5–5.1)
Sodium: 136 mmol/L (ref 135–145)

## 2019-06-28 LAB — GLUCOSE, CAPILLARY
Glucose-Capillary: 116 mg/dL — ABNORMAL HIGH (ref 70–99)
Glucose-Capillary: 118 mg/dL — ABNORMAL HIGH (ref 70–99)
Glucose-Capillary: 90 mg/dL (ref 70–99)
Glucose-Capillary: 103 mg/dL — ABNORMAL HIGH (ref 70–99)

## 2019-06-28 LAB — CBC
HCT: 29.1 % — ABNORMAL LOW (ref 39.0–52.0)
Hemoglobin: 9.2 g/dL — ABNORMAL LOW (ref 13.0–17.0)
MCH: 35.8 pg — ABNORMAL HIGH (ref 26.0–34.0)
MCHC: 31.6 g/dL (ref 30.0–36.0)
MCV: 113.2 fL — ABNORMAL HIGH (ref 80.0–100.0)
Platelets: 99 10*3/uL — ABNORMAL LOW (ref 150–400)
RBC: 2.57 MIL/uL — ABNORMAL LOW (ref 4.22–5.81)
RDW: 17.5 % — ABNORMAL HIGH (ref 11.5–15.5)
WBC: 8.7 10*3/uL (ref 4.0–10.5)
nRBC: 0.6 % — ABNORMAL HIGH (ref 0.0–0.2)

## 2019-06-28 LAB — HEMOGLOBIN A1C
Hgb A1c MFr Bld: 5.1 % (ref 4.8–5.6)
Mean Plasma Glucose: 99.67 mg/dL

## 2019-06-28 LAB — HIV ANTIBODY (ROUTINE TESTING W REFLEX): HIV Screen 4th Generation wRfx: NONREACTIVE

## 2019-06-28 MED ORDER — CEFDINIR 300 MG PO CAPS
300.0000 mg | ORAL_CAPSULE | Freq: Two times a day (BID) | ORAL | Status: DC
  Administered 2019-06-28: 300 mg via ORAL
  Filled 2019-06-28 (×3): qty 1

## 2019-06-28 MED ORDER — METRONIDAZOLE 500 MG PO TABS
500.0000 mg | ORAL_TABLET | Freq: Three times a day (TID) | ORAL | Status: DC
  Administered 2019-06-28 – 2019-06-29 (×2): 500 mg via ORAL
  Filled 2019-06-28 (×2): qty 1

## 2019-06-28 NOTE — Care Management Important Message (Signed)
Important Message  Patient Details IM Letter given to Cookie McGibboney RN to present to the Patient Name: Newt Levingston MRN: [id number] Date of Birth: 19-Feb-1961   Medicare Important Message Given:  Yes     Kerin Salen 06/28/2019, 10:49 AM

## 2019-06-28 NOTE — Progress Notes (Signed)
PROGRESS NOTE   Warren Salas  ZOX:[id number]    DOB: 1961-08-01    DOA: 06/25/2019  PCP: Patient, No Pcp Per   I have briefly reviewed patients previous medical records in [facility].  Chief Complaint  Patient presents with  . Hypotension     Brief Narrative:  58 year old male with PMH of CAD, COPD, DM 2, HTN, chronic alcohol abuse, ongoing tobacco abuse, presented to ED due to generalized weakness and diarrhea for the past 2 weeks.  He also reported some abdominal pain, cough, dyspnea and subjective fevers.  Hypotensive in the ED which resolved after IV fluids.  CT abdomen and RUQ ultrasound suggested gallbladder wall thickening and pericholecystic fluid.  CT chest suggested possible aspiration pneumonia and left lower lobe.  Admitted for possible aspiration pneumonia, evaluation for cholecystitis and anemia.   Assessment & Plan:   Active Problems:   CAD (coronary artery disease)   Type 2 diabetes mellitus with complication, without long-term current use of insulin (HCC)   Alcohol dependence with alcohol-induced mood disorder (HCC)   Acute anemia   Hypokalemia   Hypomagnesemia   Aspiration pneumonia (HCC)   Acute cholecystitis   Suspected aspiration pneumonia Presented with some cough and dyspnea.  COVID-19 testing negative.  Blood cultures x2: Negative to date.   CT chest had shown findings suggestive of mild pneumonia Completed 2 days of IV Zosyn.  Avoiding Augmentin due to profuse diarrhea. Changed to oral Flagyl and Omnicef and complete total 5 days course.  Abnormal gallbladder on imaging CT and abdominal ultrasound showed gallbladder wall thickening and some pericholecystic fluid.  He had some right upper quadrant tenderness in ED but no leukocytosis or fever but there was still some concern for acute cholecystitis and hence HIDA scan was obtained which was negative. Less likely to be acute cholecystitis.  No further abdominal pain.  Tolerating regular diet.   Anemia of chronic disease Hemoglobin was 13.4 about 3 weeks ago, this dropped down to mid 8 g range in the absence of overt bleeding.  FOBT negative.  Initiated on Protonix 40 mg IV daily.  Hemoglobin stable.  Close outpatient follow-up and may consider GI work-up as needed.  Hemoglobin up to 9.4. Anemia panel: Iron 109, saturation ratio 74, ferritin 562, transferrin 105, RBC folate: 1164 and B12: 337.  Thrombocytopenia May be related to alcohol abuse.  Continues to improve.  Alcohol abuse No overt withdrawal.  DC CIWA protocol.  Reports last drink was 5 to 6 days ago.  Tobacco abuse Cessation counseled.  Continue nicotine patch.  Type II DM Not on medications at home.  Reasonable inpatient control.  A1c was 5.2 in November.  Hypokalemia and hypomagnesemia Replaced  Chronic diarrhea Unclear etiology.  GI panel and C. difficile panel negative.  PRN Imodium reportedly helping, continue.  Patient states that he had "15 episodes" of diarrhea yesterday but better since last night.  Monitor overnight and if severe diarrhea persists, consider GI evaluation.  Then if this improves, he will need GI evaluation as outpatient at some point. Advised patient and RN to monitor stool frequency, amount and consistency.  DVT prophylaxis: SCD Code Status: Full Family Communication: None at bedside Disposition: DC home pending clinical improvement, possibly in the next 1 to 2 days.   Consultants:  None  Procedures:  None  Antimicrobials:  Zosyn-discontinued 7/2 Oral Flagyl and Omnicef 7/2 >   Subjective: Denies nausea, vomiting or abdominal pain.  Tolerating liquid diet and willing to advance to regular diet.  States that he had "15 episodes" of diarrhea yesterday but better after dose of Imodium last night.  Indicates that the diarrhea has been going on for past 2 weeks.  ROS: As above, otherwise negative.  Objective:  Vitals:   06/28/19 0645 06/28/19 0821 06/28/19 0823 06/28/19 1338   BP: (!) 140/57   (!) 141/77  Pulse: 65   62  Resp: 18   16  Temp: 98.5 F (36.9 C)   98.2 F (36.8 C)  TempSrc: Oral   Oral  SpO2: 98% 98% 95% 99%  Weight:      Height:        Examination:  General exam: Pleasant young male, moderately built and nourished lying comfortably propped up in bed.  Oral mucosa moist. Respiratory system: Clear to auscultation.  No increased work of breathing. Cardiovascular system: S1 & S2 heard, RRR. No JVD, murmurs, rubs, gallops or clicks. No pedal edema.  Stable. Gastrointestinal system: Abdomen is nondistended, soft and nontender. No organomegaly or masses felt. Normal bowel sounds heard.  Stable without change. Central nervous system: Alert and oriented. No focal neurological deficits. Extremities: Symmetric 5 x 5 power. Skin: No rashes, lesions or ulcers Psychiatry: Judgement and insight appear normal. Mood & affect appropriate.     Data Reviewed: I have personally reviewed following labs and imaging studies  CBC: Recent Labs  Lab 06/25/19 2341 06/26/19 0341 06/26/19 1228 06/27/19 0446 06/28/19 0453  WBC 9.1 6.5  --  6.8 8.7  HGB 9.0* 8.4*  --  8.5* 9.2*  HCT 27.0* 26.6* 30.0*  28.1* 26.9* 29.1*  MCV 107.6* 109.0*  --  112.1* 113.2*  PLT 72* 71*  --  87* 99*   Basic Metabolic Panel: Recent Labs  Lab 06/25/19 2341 06/26/19 1228 06/27/19 0446 06/28/19 0453  NA 133*  --  139 136  K 3.0*  --  3.8 4.1  CL 104  --  113* 106  CO2 19*  --  21* 23  GLUCOSE 97  --  102* 96  BUN 5*  --  5* <5*  CREATININE 0.62  --  0.43* 0.46*  CALCIUM 8.0*  --  8.0* 8.5*  MG 1.5* 1.4* 1.7  --    Liver Function Tests: Recent Labs  Lab 06/25/19 2341 06/27/19 0446  AST 43* 38  ALT 22 21  ALKPHOS 163* 137*  BILITOT 0.5 0.7  PROT 5.2* 5.2*  ALBUMIN 2.3* 2.4*   Coagulation Profile: No results for input(s): INR, PROTIME in the last 168 hours. Cardiac Enzymes: No results for input(s): CKTOTAL, CKMB, CKMBINDEX, TROPONINI in the last 168 hours.  HbA1C: No results for input(s): HGBA1C in the last 72 hours. CBG: Recent Labs  Lab 06/27/19 1657 06/27/19 2103 06/28/19 0754 06/28/19 1144 06/28/19 1635  GLUCAP 92 88 116* 118* 90    Recent Results (from the past 240 hour(s))  SARS Coronavirus 2 (CEPHEID - Performed in [facility] hospital lab), Hosp Order     Status: None   Collection Time: 06/26/19  9:15 AM   Specimen: Nasopharyngeal Swab  Result Value Ref Range Status   SARS Coronavirus 2 NEGATIVE NEGATIVE Final    Comment: (NOTE) If result is NEGATIVE SARS-CoV-2 target nucleic acids are NOT DETECTED. The SARS-CoV-2 RNA is generally detectable in upper and lower  respiratory specimens during the acute phase of infection. The lowest  concentration of SARS-CoV-2 viral copies this assay can detect is 250  copies / mL. A negative result does not preclude SARS-CoV-2 infection  and  should not be used as the sole basis for treatment or other  patient management decisions.  A negative result may occur with  improper specimen collection / handling, submission of specimen other  than nasopharyngeal swab, presence of viral mutation(s) within the  areas targeted by this assay, and inadequate number of viral copies  (<250 copies / mL). A negative result must be combined with clinical  observations, patient history, and epidemiological information. If result is POSITIVE SARS-CoV-2 target nucleic acids are DETECTED. The SARS-CoV-2 RNA is generally detectable in upper and lower  respiratory specimens dur ing the acute phase of infection.  Positive  results are indicative of active infection with SARS-CoV-2.  Clinical  correlation with patient history and other diagnostic information is  necessary to determine patient infection status.  Positive results do  not rule out bacterial infection or co-infection with other viruses. If result is PRESUMPTIVE POSTIVE SARS-CoV-2 nucleic acids MAY BE PRESENT.   A presumptive positive result was  obtained on the submitted specimen  and confirmed on repeat testing.  While 2019 novel coronavirus  (SARS-CoV-2) nucleic acids may be present in the submitted sample  additional confirmatory testing may be necessary for epidemiological  and / or clinical management purposes  to differentiate between  SARS-CoV-2 and other Sarbecovirus currently known to infect humans.  If clinically indicated additional testing with an alternate test  methodology [phone] is advised. The SARS-CoV-2 RNA is generally  detectable in upper and lower respiratory sp ecimens during the acute  phase of infection. The expected result is Negative. Fact Sheet for Patients:  BoilerBrush.com.cyhttps://www.fda.gov/media/136312/download Fact Sheet for Healthcare Providers: https://pope.com/https://www.fda.gov/media/136313/download This test is not yet approved or cleared by the [place] FDA and has been authorized for detection and/or diagnosis of SARS-CoV-2 by FDA under an Emergency Use Authorization (EUA).  This EUA will remain in effect (meaning this test can be used) for the duration of the COVID-19 declaration under Section 564(b)(1) of the Act, 21 U.S.C. section 360bbb-3(b)(1), unless the authorization is terminated or revoked sooner. Performed at [facility], [address] [place], [place] [address]   Culture, blood (routine x 2)     Status: None (Preliminary result)   Collection Time: 06/26/19 12:28 PM   Specimen: BLOOD  Result Value Ref Range Status   Specimen Description   Final    BLOOD RIGHT ARM Performed at [facility], [address] [place], [place] [address]    Special Requests   Final    BOTTLES DRAWN AEROBIC AND ANAEROBIC Blood Culture adequate volume Performed at [facility], [address] [place], [place] [address]    Culture   Final    NO GROWTH 2 DAYS Performed at [facility], [address] [place], [place] [address]    Report Status PENDING   Incomplete  Culture, blood (routine x 2)     Status: None (Preliminary result)   Collection Time: 06/26/19 12:28 PM   Specimen: BLOOD RIGHT HAND  Result Value Ref Range Status   Specimen Description   Final    BLOOD RIGHT HAND Performed at [facility], [address] [place], [place] [address]    Special Requests   Final    BOTTLES DRAWN AEROBIC AND ANAEROBIC Blood Culture adequate volume Performed at [facility], [address] [place], [place] [address]    Culture   Final    NO GROWTH 2 DAYS Performed at [facility],  [address], [place], [place] [address]    Report Status PENDING  Incomplete  Gastrointestinal Panel by PCR , Stool     Status: None   Collection Time: 06/26/19  4:30 PM   Specimen: Stool  Result Value Ref Range Status   Campylobacter species NOT DETECTED NOT DETECTED Final   Plesimonas shigelloides NOT DETECTED NOT DETECTED Final   Salmonella species NOT DETECTED NOT DETECTED Final   Yersinia enterocolitica NOT DETECTED NOT DETECTED Final   Vibrio species NOT DETECTED NOT DETECTED Final   Vibrio cholerae NOT DETECTED NOT DETECTED Final   Enteroaggregative E coli (EAEC) NOT DETECTED NOT DETECTED Final   Enteropathogenic E coli (EPEC) NOT DETECTED NOT DETECTED Final   Enterotoxigenic E coli (ETEC) NOT DETECTED NOT DETECTED Final   Shiga like toxin producing E coli (STEC) NOT DETECTED NOT DETECTED Final   Shigella/Enteroinvasive E coli (EIEC) NOT DETECTED NOT DETECTED Final   Cryptosporidium NOT DETECTED NOT DETECTED Final   Cyclospora cayetanensis NOT DETECTED NOT DETECTED Final   Entamoeba histolytica NOT DETECTED NOT DETECTED Final   Giardia lamblia NOT DETECTED NOT DETECTED Final   Adenovirus F40/41 NOT DETECTED NOT DETECTED Final   Astrovirus NOT DETECTED NOT DETECTED Final   Norovirus GI/GII NOT DETECTED NOT DETECTED Final   Rotavirus A NOT DETECTED NOT DETECTED Final   Sapovirus (I, II, IV, and V) NOT  DETECTED NOT DETECTED Final    Comment: Performed at [facility], [address] [place], [place] [address]  C Difficile Quick Screen w PCR reflex     Status: None   Collection Time: 06/26/19  4:30 PM  Result Value Ref Range Status   C Diff antigen NEGATIVE NEGATIVE Final   C Diff toxin NEGATIVE NEGATIVE Final   C Diff interpretation No C. difficile detected.  Final    Comment: Performed at [facility], [place] [address] [place], [place] [address]         Radiology Studies: No results found.      Scheduled Meds: . atorvastatin  80 mg Oral q1800  . cefdinir  300 mg Oral Q12H  . folic acid  1 mg Oral Daily  . gabapentin  300 mg Oral BID  . insulin aspart  0-15 Units Subcutaneous TID WC  . insulin aspart  0-5 Units Subcutaneous QHS  . metoprolol tartrate  50 mg Oral BID  . metroNIDAZOLE  500 mg Oral Q8H  . mometasone-formoterol  2 puff Inhalation BID  . multivitamin with minerals  1 tablet Oral Daily  . pantoprazole  40 mg Oral Daily  . sertraline  100 mg Oral Daily  . thiamine  100 mg Oral Daily   Or  . thiamine  100 mg Intravenous Daily  . ticagrelor  90 mg Oral BID   Continuous Infusions: . 0.9 % NaCl with KCl 40 mEq / L 50 mL/hr at 06/28/19 0600     LOS: 2 days     Vernell Leep, MD, FACP, [facility]. [facility]  To contact the attending provider between 7A-7P or the covering provider during after hours 7P-7A, please log into the web site [email or web address] and access using universal [place] password for that web site. If you do not have the password, please call the hospital operator.  06/28/2019, 4:59 PM

## 2019-06-29 LAB — CBC
HCT: 26.9 % — ABNORMAL LOW (ref 39.0–52.0)
Hemoglobin: 8.6 g/dL — ABNORMAL LOW (ref 13.0–17.0)
MCH: 35.5 pg — ABNORMAL HIGH (ref 26.0–34.0)
MCHC: 32 g/dL (ref 30.0–36.0)
MCV: 111.2 fL — ABNORMAL HIGH (ref 80.0–100.0)
Platelets: 111 10*3/uL — ABNORMAL LOW (ref 150–400)
RBC: 2.42 MIL/uL — ABNORMAL LOW (ref 4.22–5.81)
RDW: 17.3 % — ABNORMAL HIGH (ref 11.5–15.5)
WBC: 8 10*3/uL (ref 4.0–10.5)
nRBC: 0.5 % — ABNORMAL HIGH (ref 0.0–0.2)

## 2019-06-29 LAB — BASIC METABOLIC PANEL
Anion gap: 9 (ref 5–15)
BUN: <5 mg/dL — ABNORMAL LOW (ref 6–20)
CO2: 24 mmol/L (ref 22–32)
Calcium: 8.7 mg/dL — ABNORMAL LOW (ref 8.9–10.3)
Chloride: 104 mmol/L (ref 98–111)
Creatinine, Ser: 0.5 mg/dL — ABNORMAL LOW (ref 0.61–1.24)
GFR calc Af Amer: >60 mL/min (ref >60)
GFR calc non Af Amer: >60 mL/min (ref >60)
Glucose, Bld: 97 mg/dL (ref 70–99)
Potassium: 3.8 mmol/L (ref 3.5–5.1)
Sodium: 137 mmol/L (ref 135–145)

## 2019-06-29 LAB — GLUCOSE, CAPILLARY: Glucose-Capillary: 153 mg/dL — ABNORMAL HIGH (ref 70–99)
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
## 2019-06-29 NOTE — Progress Notes (Signed)
RN and NT unable to locate pt in room or in hallways of department. Security notified and attempts made to locate pt. Pt unable to be located on hospital premises. MD notified. Multiple attempts made to contact pt at provided phone number and unable to contact pt. Pt was alert and oriented. AC made aware.  Warren Salas

## 2019-07-01 LAB — CULTURE, BLOOD (ROUTINE X 2)
Culture: NO GROWTH
Culture: NO GROWTH
Special Requests: ADEQUATE
Special Requests: ADEQUATE

## 2019-07-05 ENCOUNTER — Ambulatory Visit: Payer: Medicare Other | Admitting: Critical Care Medicine

## 2019-07-05 NOTE — Progress Notes (Deleted)
Subjective:    Patient ID: Warren Salas, male    DOB: 1961-04-07, 58 y.o.   MRN: [id number]  Admit date: 06/25/2019 Discharge date: 06/29/2019   Patient left the hospital [place].  Recommendations for Outpatient Follow-up:  1. Needs to follow-up with PCP (an appointment had been made at [facility] for 07/05/2019) but patient did not wait for formal discharge to provide the details.  He left AMA.  When RN tried to contact patient by phone, he answered but seemed to say that it was not him.  Home Health: N/A Equipment/Devices: N/A  Discharge Condition: Improved and stable. CODE STATUS: Full Diet recommendation: None made.  Patient left AMA.  Discharge Diagnoses:  Active Problems:   CAD (coronary artery disease)   Type 2 diabetes mellitus with complication, without long-term current use of insulin (HCC)   Alcohol dependence with alcohol-induced mood disorder (HCC)   Acute anemia   Hypokalemia   Hypomagnesemia   Aspiration pneumonia (HCC)   Acute cholecystitis   Brief Summary: 58 year old male with PMH of CAD, COPD, DM 2, HTN, chronic alcohol abuse, ongoing tobacco abuse, presented to ED due to generalized weakness and diarrhea for the past 2 weeks. He also reported some abdominal pain, cough, dyspnea and subjective fevers. Hypotensive in the ED which resolved after IV fluids. CT abdomen and RUQ ultrasound suggested gallbladder wall thickening and pericholecystic fluid. CT chest suggested possible aspiration pneumonia and left lower lobe. Admitted for possible aspiration pneumonia, evaluation for cholecystitis and anemia.   Assessment & Plan:  Suspected aspiration pneumonia Presented with some cough and dyspnea. COVID-19 testing negative. Blood cultures x2: Negative to date.  CT chest had shown findings suggestive of mild pneumonia Completed 2 days of IV Zosyn. Avoiding Augmentin due to profuse diarrhea.  Changed to oral Flagyl and Omnicef and complete total 5 days course. Improved and stable.  Patient left AMA without waiting for prescriptions.  Abnormal gallbladder on imaging CT and abdominal ultrasound showed gallbladder wall thickening and some pericholecystic fluid. He had some right upper quadrant tenderness in ED but no leukocytosis or fever but there was still some concern for acute cholecystitis and hence HIDA scan was obtained which was negative. Less likely to be acute cholecystitis. No further abdominal pain.  Tolerating regular diet. Improved and stable.  Anemiaof chronic disease Hemoglobin was 13.4 about 3 weeks ago, this dropped down to mid 8 g range in the absence of overt bleeding. FOBT negative. Initiated on Protonix 40 mg IV daily. Hemoglobin stable. Close outpatient follow-up and may consider GI work-up as needed.  Hemoglobin stable in the mid 8 g range. Anemia panel: Iron 109, saturation ratio 74, ferritin 562, transferrin 105, RBC folate: 1164 and B12:337. I had discussed with patient regarding importance of outpatient GI consultation yesterday and this morning prior to him leaving AMA.  He verbalized understanding.  Thrombocytopenia May be related to alcohol abuse.Continues to improve.  Alcohol abuse No overt withdrawal.DCCIWA protocol.Reports last drink was 5 to 6 days ago.  Tobacco abuse Cessation counseled. Continue nicotinepatch.  Type II DM Not on medications at home. Reasonable inpatient control.A1c was 5.2 in November.  Hypokalemia and hypomagnesemia Replaced  Chronic diarrhea Unclear etiology. GI panel and C. difficile panel negative. PRN Imodium helped and he stated that his diarrhea has almost resolved and had a formed stool this morning.       Review of Systems     Objective:   Physical Exam  Assessment & Plan:

## 2019-07-07 ENCOUNTER — Encounter (HOSPITAL_COMMUNITY): Payer: Self-pay | Admitting: Emergency Medicine

## 2019-07-07 ENCOUNTER — Emergency Department (HOSPITAL_COMMUNITY)
Admission: EM | Admit: 2019-07-07 | Discharge: 2019-07-08 | Disposition: A | Discharge status: Home / Self Care | Payer: Medicare Other | Attending: Emergency Medicine | Admitting: Emergency Medicine

## 2019-07-07 ENCOUNTER — Other Ambulatory Visit: Payer: Self-pay

## 2019-07-07 DIAGNOSIS — I252 Old myocardial infarction: Secondary | ICD-10-CM | POA: Diagnosis not present

## 2019-07-07 DIAGNOSIS — I10 Essential (primary) hypertension: Secondary | ICD-10-CM | POA: Insufficient documentation

## 2019-07-07 DIAGNOSIS — J45909 Unspecified asthma, uncomplicated: Secondary | ICD-10-CM | POA: Diagnosis not present

## 2019-07-07 DIAGNOSIS — R55 Syncope and collapse: Secondary | ICD-10-CM | POA: Diagnosis present

## 2019-07-07 DIAGNOSIS — E119 Type 2 diabetes mellitus without complications: Secondary | ICD-10-CM | POA: Insufficient documentation

## 2019-07-07 DIAGNOSIS — I2510 Atherosclerotic heart disease of native coronary artery without angina pectoris: Secondary | ICD-10-CM | POA: Diagnosis not present

## 2019-07-07 DIAGNOSIS — F10920 Alcohol use, unspecified with intoxication, uncomplicated: Secondary | ICD-10-CM | POA: Diagnosis not present

## 2019-07-07 DIAGNOSIS — F17210 Nicotine dependence, cigarettes, uncomplicated: Secondary | ICD-10-CM | POA: Diagnosis not present

## 2019-07-07 DIAGNOSIS — J449 Chronic obstructive pulmonary disease, unspecified: Secondary | ICD-10-CM | POA: Insufficient documentation
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
## 2019-07-08 NOTE — ED Provider Notes (Signed)
Patient initially seen and evaluated by Dr. Zenia Resides for alcohol intoxication, felt stable, under observation while sobering.   5:45 - patient is awake, ambulatory, conversant. VSS. He can be discharged home per plan of previous treatment team.    Charlann Lange, PA-C 07/08/19 0550    Ezequiel Essex, MD 07/08/19 [phone]

## 2019-08-28 DIAGNOSIS — Z20828 Contact with and (suspected) exposure to other viral communicable diseases: Secondary | ICD-10-CM | POA: Diagnosis not present

## 2019-09-11 ENCOUNTER — Emergency Department (HOSPITAL_COMMUNITY)
Admission: EM | Admit: 2019-09-11 | Discharge: 2019-09-12 | Disposition: A | Discharge status: Home / Self Care | Payer: Medicare Other | Attending: Emergency Medicine | Admitting: Emergency Medicine

## 2019-09-11 ENCOUNTER — Other Ambulatory Visit: Payer: Self-pay

## 2019-09-11 DIAGNOSIS — R51 Headache: Secondary | ICD-10-CM | POA: Diagnosis not present

## 2019-09-11 DIAGNOSIS — S0083XA Contusion of other part of head, initial encounter: Secondary | ICD-10-CM

## 2019-09-11 DIAGNOSIS — Z79899 Other long term (current) drug therapy: Secondary | ICD-10-CM | POA: Insufficient documentation

## 2019-09-11 DIAGNOSIS — Y999 Unspecified external cause status: Secondary | ICD-10-CM | POA: Diagnosis not present

## 2019-09-11 DIAGNOSIS — S0003XA Contusion of scalp, initial encounter: Secondary | ICD-10-CM | POA: Diagnosis not present

## 2019-09-11 DIAGNOSIS — S0121XA Laceration without foreign body of nose, initial encounter: Secondary | ICD-10-CM | POA: Diagnosis not present

## 2019-09-11 DIAGNOSIS — M542 Cervicalgia: Secondary | ICD-10-CM | POA: Diagnosis not present

## 2019-09-11 DIAGNOSIS — Y939 Activity, unspecified: Secondary | ICD-10-CM | POA: Insufficient documentation

## 2019-09-11 DIAGNOSIS — R55 Syncope and collapse: Secondary | ICD-10-CM | POA: Diagnosis not present

## 2019-09-11 DIAGNOSIS — Y92410 Unspecified street and highway as the place of occurrence of the external cause: Secondary | ICD-10-CM | POA: Insufficient documentation

## 2019-09-11 DIAGNOSIS — E119 Type 2 diabetes mellitus without complications: Secondary | ICD-10-CM | POA: Insufficient documentation

## 2019-09-11 DIAGNOSIS — W010XXA Fall on same level from slipping, tripping and stumbling without subsequent striking against object, initial encounter: Secondary | ICD-10-CM | POA: Diagnosis not present

## 2019-09-11 DIAGNOSIS — F17210 Nicotine dependence, cigarettes, uncomplicated: Secondary | ICD-10-CM | POA: Insufficient documentation

## 2019-09-11 DIAGNOSIS — Z7984 Long term (current) use of oral hypoglycemic drugs: Secondary | ICD-10-CM | POA: Diagnosis not present

## 2019-09-11 DIAGNOSIS — I10 Essential (primary) hypertension: Secondary | ICD-10-CM | POA: Diagnosis not present

## 2019-09-11 DIAGNOSIS — J449 Chronic obstructive pulmonary disease, unspecified: Secondary | ICD-10-CM | POA: Insufficient documentation

## 2019-09-11 DIAGNOSIS — I2510 Atherosclerotic heart disease of native coronary artery without angina pectoris: Secondary | ICD-10-CM | POA: Insufficient documentation

## 2019-09-11 DIAGNOSIS — S0181XA Laceration without foreign body of other part of head, initial encounter: Secondary | ICD-10-CM

## 2019-09-11 DIAGNOSIS — S0081XA Abrasion of other part of head, initial encounter: Secondary | ICD-10-CM | POA: Diagnosis not present

## 2019-09-11 DIAGNOSIS — F10920 Alcohol use, unspecified with intoxication, uncomplicated: Secondary | ICD-10-CM
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
## 2019-09-12 ENCOUNTER — Emergency Department (HOSPITAL_COMMUNITY): Payer: Medicare Other

## 2019-09-12 ENCOUNTER — Encounter (HOSPITAL_COMMUNITY): Payer: Self-pay | Admitting: Emergency Medicine

## 2019-09-12 ENCOUNTER — Other Ambulatory Visit: Payer: Self-pay

## 2019-09-12 DIAGNOSIS — S0081XA Abrasion of other part of head, initial encounter: Secondary | ICD-10-CM | POA: Diagnosis not present

## 2019-09-12 DIAGNOSIS — S0121XA Laceration without foreign body of nose, initial encounter: Secondary | ICD-10-CM | POA: Diagnosis not present

## 2019-09-12 DIAGNOSIS — S0003XA Contusion of scalp, initial encounter: Secondary | ICD-10-CM | POA: Diagnosis not present

## 2019-09-12 DIAGNOSIS — R55 Syncope and collapse: Secondary | ICD-10-CM | POA: Diagnosis not present

## 2019-09-12 NOTE — ED Provider Notes (Signed)
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  Tolerated well, no immediate complications   (including critical care time)  Medications Ordered in ED Medications - No data to display   Initial Impression / Assessment and Plan / ED Course  I have reviewed the triage vital signs and the nursing notes.  Pertinent labs & imaging results that were available during my care of the patient were reviewed by me and considered in my medical decision making (see chart for details).        Patient presents to the emergency department for evaluation after a fall.  Patient has a history of alcoholism with frequent visits to the ER.  Patient underwent CT head, cervical spine, maxillofacial bones.  Only soft tissue injuries are noted.  He had some pain in his back with movement of the legs, x-ray of thoracic and lumbar spine unremarkable, pelvis does not show any evidence of fracture.  He is still very intoxicated, will need to be monitored until more sober for discharge.  Final Clinical Impressions(s) / ED Diagnoses   Final diagnoses:  Alcoholic intoxication without complication (HCC)  Facial laceration, initial encounter  Facial contusion, initial encounter    ED Discharge Orders    None       Gilda Crease, MD 09/12/19 [phone]

## 2019-09-20 ENCOUNTER — Encounter (HOSPITAL_COMMUNITY): Payer: Self-pay | Admitting: Emergency Medicine

## 2019-09-20 ENCOUNTER — Other Ambulatory Visit: Payer: Self-pay

## 2019-09-20 ENCOUNTER — Emergency Department (HOSPITAL_COMMUNITY)
Admission: EM | Admit: 2019-09-20 | Discharge: 2019-09-21 | Disposition: A | Discharge status: Home / Self Care | Payer: Medicare Other | Attending: Emergency Medicine | Admitting: Emergency Medicine

## 2019-09-20 DIAGNOSIS — R440 Auditory hallucinations: Secondary | ICD-10-CM | POA: Diagnosis not present

## 2019-09-20 DIAGNOSIS — R4182 Altered mental status, unspecified: Secondary | ICD-10-CM | POA: Diagnosis not present

## 2019-09-20 DIAGNOSIS — F10120 Alcohol abuse with intoxication, uncomplicated: Secondary | ICD-10-CM | POA: Insufficient documentation

## 2019-09-20 DIAGNOSIS — Z955 Presence of coronary angioplasty implant and graft: Secondary | ICD-10-CM | POA: Diagnosis not present

## 2019-09-20 DIAGNOSIS — R45851 Suicidal ideations: Secondary | ICD-10-CM | POA: Insufficient documentation

## 2019-09-20 DIAGNOSIS — F10929 Alcohol use, unspecified with intoxication, unspecified: Secondary | ICD-10-CM | POA: Diagnosis present

## 2019-09-20 DIAGNOSIS — I252 Old myocardial infarction: Secondary | ICD-10-CM | POA: Diagnosis not present

## 2019-09-20 DIAGNOSIS — J3489 Other specified disorders of nose and nasal sinuses: Secondary | ICD-10-CM | POA: Insufficient documentation

## 2019-09-20 DIAGNOSIS — Z590 Homelessness: Secondary | ICD-10-CM | POA: Insufficient documentation

## 2019-09-20 DIAGNOSIS — F17210 Nicotine dependence, cigarettes, uncomplicated: Secondary | ICD-10-CM | POA: Diagnosis not present

## 2019-09-20 DIAGNOSIS — F332 Major depressive disorder, recurrent severe without psychotic features: Secondary | ICD-10-CM | POA: Diagnosis not present

## 2019-09-20 DIAGNOSIS — E119 Type 2 diabetes mellitus without complications: Secondary | ICD-10-CM | POA: Insufficient documentation

## 2019-09-20 DIAGNOSIS — J449 Chronic obstructive pulmonary disease, unspecified: Secondary | ICD-10-CM | POA: Diagnosis not present

## 2019-09-20 DIAGNOSIS — R51 Headache: Secondary | ICD-10-CM | POA: Insufficient documentation

## 2019-09-20 DIAGNOSIS — R441 Visual hallucinations: Secondary | ICD-10-CM | POA: Diagnosis not present

## 2019-09-20 DIAGNOSIS — Z79899 Other long term (current) drug therapy: Secondary | ICD-10-CM | POA: Diagnosis not present

## 2019-09-20 DIAGNOSIS — I2510 Atherosclerotic heart disease of native coronary artery without angina pectoris: Secondary | ICD-10-CM | POA: Insufficient documentation

## 2019-09-20 DIAGNOSIS — I6782 Cerebral ischemia: Secondary | ICD-10-CM | POA: Insufficient documentation

## 2019-09-20 DIAGNOSIS — I10 Essential (primary) hypertension: Secondary | ICD-10-CM | POA: Insufficient documentation

## 2019-09-20 DIAGNOSIS — Z7982 Long term (current) use of aspirin: Secondary | ICD-10-CM | POA: Diagnosis not present

## 2019-09-20 DIAGNOSIS — Z20828 Contact with and (suspected) exposure to other viral communicable diseases: Secondary | ICD-10-CM | POA: Diagnosis not present

## 2019-09-20 DIAGNOSIS — S0990XA Unspecified injury of head, initial encounter: Secondary | ICD-10-CM | POA: Diagnosis not present

## 2019-09-20 NOTE — ED Notes (Signed)
Pt ambulatory in lobby. Pt continues to go in and out of lobby to smoke.

## 2019-09-20 NOTE — ED Triage Notes (Signed)
Pt presents by [facility] for alcohol intoxication. EMS reports that [employer] was on scene and stated that pt was too intoxicated to go to jail. Pt initially did not want to be transported per EMS and no complaint at this time.

## 2019-09-21 ENCOUNTER — Emergency Department (HOSPITAL_COMMUNITY): Payer: Medicare Other

## 2019-09-21 ENCOUNTER — Emergency Department (HOSPITAL_COMMUNITY)
Admission: EM | Admit: 2019-09-21 | Discharge: 2019-09-21 | Disposition: A | Discharge status: Other Acute Inpatient Hospital | Payer: Medicare Other | Source: Home / Self Care | Attending: Emergency Medicine | Admitting: Emergency Medicine

## 2019-09-21 ENCOUNTER — Encounter (HOSPITAL_COMMUNITY): Payer: Self-pay | Admitting: Emergency Medicine

## 2019-09-21 ENCOUNTER — Other Ambulatory Visit: Payer: Self-pay

## 2019-09-21 DIAGNOSIS — E119 Type 2 diabetes mellitus without complications: Secondary | ICD-10-CM | POA: Insufficient documentation

## 2019-09-21 DIAGNOSIS — I252 Old myocardial infarction: Secondary | ICD-10-CM | POA: Insufficient documentation

## 2019-09-21 DIAGNOSIS — Z79899 Other long term (current) drug therapy: Secondary | ICD-10-CM | POA: Insufficient documentation

## 2019-09-21 DIAGNOSIS — F332 Major depressive disorder, recurrent severe without psychotic features: Secondary | ICD-10-CM | POA: Insufficient documentation

## 2019-09-21 DIAGNOSIS — I25810 Atherosclerosis of coronary artery bypass graft(s) without angina pectoris: Secondary | ICD-10-CM | POA: Insufficient documentation

## 2019-09-21 DIAGNOSIS — F10120 Alcohol abuse with intoxication, uncomplicated: Secondary | ICD-10-CM | POA: Diagnosis not present

## 2019-09-21 DIAGNOSIS — R442 Other hallucinations: Secondary | ICD-10-CM | POA: Diagnosis not present

## 2019-09-21 DIAGNOSIS — R45851 Suicidal ideations: Secondary | ICD-10-CM

## 2019-09-21 DIAGNOSIS — F10920 Alcohol use, unspecified with intoxication, uncomplicated: Secondary | ICD-10-CM

## 2019-09-21 DIAGNOSIS — I10 Essential (primary) hypertension: Secondary | ICD-10-CM | POA: Insufficient documentation

## 2019-09-21 DIAGNOSIS — R443 Hallucinations, unspecified: Secondary | ICD-10-CM

## 2019-09-21 DIAGNOSIS — Z7984 Long term (current) use of oral hypoglycemic drugs: Secondary | ICD-10-CM | POA: Insufficient documentation

## 2019-09-21 DIAGNOSIS — R4182 Altered mental status, unspecified: Secondary | ICD-10-CM | POA: Diagnosis not present

## 2019-09-21 DIAGNOSIS — G4489 Other headache syndrome: Secondary | ICD-10-CM | POA: Diagnosis not present

## 2019-09-21 DIAGNOSIS — R51 Headache: Secondary | ICD-10-CM | POA: Insufficient documentation

## 2019-09-21 DIAGNOSIS — F17210 Nicotine dependence, cigarettes, uncomplicated: Secondary | ICD-10-CM | POA: Insufficient documentation

## 2019-09-21 DIAGNOSIS — Z7982 Long term (current) use of aspirin: Secondary | ICD-10-CM | POA: Insufficient documentation

## 2019-09-21 DIAGNOSIS — Z20828 Contact with and (suspected) exposure to other viral communicable diseases: Secondary | ICD-10-CM | POA: Insufficient documentation

## 2019-09-21 DIAGNOSIS — R519 Headache, unspecified: Secondary | ICD-10-CM

## 2019-09-21 DIAGNOSIS — J449 Chronic obstructive pulmonary disease, unspecified: Secondary | ICD-10-CM | POA: Insufficient documentation

## 2019-09-21 LAB — CBC WITH DIFFERENTIAL/PLATELET
Abs Immature Granulocytes: 0.01 10*3/uL (ref 0.00–0.07)
Basophils Absolute: 0.1 10*3/uL (ref 0.0–0.1)
Basophils Relative: 1 %
Eosinophils Absolute: 0 10*3/uL (ref 0.0–0.5)
Eosinophils Relative: 0 %
HCT: 47.6 % (ref 39.0–52.0)
Hemoglobin: 15.8 g/dL (ref 13.0–17.0)
Immature Granulocytes: 0 %
Lymphocytes Relative: 37 %
Lymphs Abs: 2.9 10*3/uL (ref 0.7–4.0)
MCH: 30.5 pg (ref 26.0–34.0)
MCHC: 33.2 g/dL (ref 30.0–36.0)
MCV: 91.9 fL (ref 80.0–100.0)
Monocytes Absolute: 0.5 10*3/uL (ref 0.1–1.0)
Monocytes Relative: 6 %
Neutro Abs: 4.4 10*3/uL (ref 1.7–7.7)
Neutrophils Relative %: 56 %
Platelets: 257 10*3/uL (ref 150–400)
RBC: 5.18 MIL/uL (ref 4.22–5.81)
RDW: 14.2 % (ref 11.5–15.5)
WBC: 7.8 10*3/uL (ref 4.0–10.5)
nRBC: 0 % (ref 0.0–0.2)

## 2019-09-21 LAB — COMPREHENSIVE METABOLIC PANEL
ALT: 31 U/L (ref 0–44)
AST: 54 U/L — ABNORMAL HIGH (ref 15–41)
Albumin: 4.4 g/dL (ref 3.5–5.0)
Alkaline Phosphatase: 131 U/L — ABNORMAL HIGH (ref 38–126)
Anion gap: 19 — ABNORMAL HIGH (ref 5–15)
BUN: 10 mg/dL (ref 6–20)
CO2: 19 mmol/L — ABNORMAL LOW (ref 22–32)
Calcium: 8.8 mg/dL — ABNORMAL LOW (ref 8.9–10.3)
Chloride: 97 mmol/L — ABNORMAL LOW (ref 98–111)
Creatinine, Ser: 0.5 mg/dL — ABNORMAL LOW (ref 0.61–1.24)
GFR calc Af Amer: >60 mL/min (ref >60)
GFR calc non Af Amer: >60 mL/min (ref >60)
Glucose, Bld: 100 mg/dL — ABNORMAL HIGH (ref 70–99)
Potassium: 3.6 mmol/L (ref 3.5–5.1)
Sodium: 135 mmol/L (ref 135–145)
Total Bilirubin: 0.5 mg/dL (ref 0.3–1.2)
Total Protein: 8.2 g/dL — ABNORMAL HIGH (ref 6.5–8.1)

## 2019-09-21 LAB — ACETAMINOPHEN LEVEL: Acetaminophen (Tylenol), Serum: <10 ug/mL — ABNORMAL LOW (ref 10–30)

## 2019-09-21 LAB — RAPID URINE DRUG SCREEN, HOSP PERFORMED
Amphetamines: NOT DETECTED
Barbiturates: NOT DETECTED
Benzodiazepines: POSITIVE — AB
Cocaine: NOT DETECTED
Opiates: NOT DETECTED
Tetrahydrocannabinol: NOT DETECTED

## 2019-09-21 LAB — SALICYLATE LEVEL: Salicylate Lvl: <7 mg/dL (ref 2.8–30.0)

## 2019-09-21 LAB — ETHANOL: Alcohol, Ethyl (B): 269 mg/dL — ABNORMAL HIGH (ref <10)

## 2019-09-21 LAB — SARS CORONAVIRUS 2 BY RT PCR (HOSPITAL ORDER, PERFORMED IN ~~LOC~~ HOSPITAL LAB): SARS Coronavirus 2: NEGATIVE

## 2019-09-21 LAB — SARS CORONAVIRUS 2 (TAT 6-24 HRS): SARS Coronavirus 2: NEGATIVE

## 2019-09-21 MED ORDER — SODIUM CHLORIDE 0.9 % IV BOLUS
1000.0000 mL | Freq: Once | INTRAVENOUS | Status: AC
  Administered 2019-09-21: 1000 mL via INTRAVENOUS

## 2019-09-21 MED ORDER — VITAMIN B-1 100 MG PO TABS: 100.0000 mg | ORAL_TABLET | Freq: Every day | ORAL | Status: DC

## 2019-09-21 MED ORDER — LORAZEPAM 2 MG/ML IJ SOLN: 0.0000 mg | Freq: Two times a day (BID) | INTRAMUSCULAR | Status: DC

## 2019-09-21 MED ORDER — LORAZEPAM 1 MG PO TABS
0.0000 mg | ORAL_TABLET | Freq: Four times a day (QID) | ORAL | Status: DC
  Administered 2019-09-21: 2 mg via ORAL
  Filled 2019-09-21: qty 2

## 2019-09-21 MED ORDER — THIAMINE HCL 100 MG/ML IJ SOLN
100.0000 mg | Freq: Every day | INTRAMUSCULAR | Status: DC
  Administered 2019-09-21: 100 mg via INTRAVENOUS
  Filled 2019-09-21: qty 2

## 2019-09-21 MED ORDER — ALBUTEROL SULFATE HFA 108 (90 BASE) MCG/ACT IN AERS: 1.0000 | INHALATION_SPRAY | Freq: Four times a day (QID) | RESPIRATORY_TRACT | Status: DC | PRN

## 2019-09-21 MED ORDER — LORAZEPAM 1 MG PO TABS: 0.0000 mg | ORAL_TABLET | Freq: Two times a day (BID) | ORAL | Status: DC

## 2019-09-21 MED ORDER — LORAZEPAM 2 MG/ML IJ SOLN
0.0000 mg | Freq: Four times a day (QID) | INTRAMUSCULAR | Status: DC
  Administered 2019-09-21: 2 mg via INTRAVENOUS
  Filled 2019-09-21: qty 1

## 2019-09-21 NOTE — ED Provider Notes (Signed)
Accepted the hand-off from [facility], PA-C at shift change.  After speaking with PA Shelly Coss, evaluating the patient, and reviewing his lab work, he is cleared for discharge from a medical standpoint.  Patient was resting comfortably on examination. Patient is currently awaiting TTS evaluation to determine disposition.   Patient was assessed by counselor Viviana Simpler and NP Hall Busing who recommends inpatient admission to assist with stabilization.  He was accepted to [facility], pending negative COVID-19 test being faxed.    Corena Herter, PA-C 09/21/19 Sharlette Dense, MD 09/22/19 [phone]

## 2019-09-21 NOTE — ED Triage Notes (Signed)
Pt arrives via EMS with reports of HA, pt hx of bipolar and reports hearing and seeing things

## 2019-09-21 NOTE — ED Provider Notes (Signed)
Medical screening examination/treatment/procedure(s) were conducted as a shared visit with non-physician practitioner(s) and myself.  I personally evaluated the patient during the encounter.  EKG Interpretation  Date/Time:  Friday September 21 2019 09:45:06 EDT Ventricular Rate:  100 PR Interval:    QRS Duration: 98 QT Interval:  361 QTC Calculation: 466 R Axis:   92 Text Interpretation:  Sinus tachycardia Borderline right axis deviation Minimal ST elevation, anterior leads Baseline wander in lead(s) V4 No significant change since last tracing Confirmed by Lacretia Leigh ([id number]) on 09/21/2019 10:83:36 AM  58 year old male who presents with headache.  History of alcohol abuse.  Head CT without evidence of bleed.  Alcohol level is elevated here.  Will likely medically clear and may have psychiatry to see   Lacretia Leigh, MD 09/21/19 1106

## 2019-09-21 NOTE — BH Assessment (Signed)
[facility] Assessment Progress Note  Case was staffed with Tate NP who recommended a inpatient admission to assist with stabilization.    

## 2019-09-21 NOTE — Care Management (Signed)
Accepted to [facility] [employer] pending a negative COVID test result faxed [phone].  Writer informed the nurse working with the patient.   Dr. Abbey Chatters is the accepting  [phone] is the number to give report  He can come anytime
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
## 2019-09-21 NOTE — ED Notes (Signed)
RN attempted calling report to [facility] with number previously noted by CM. [phone]. No answer.

## 2019-09-21 NOTE — ED Notes (Signed)
Attempted to wake patient to do CIWA, opened eyes but did not verbally respond, sleeping comfortably at this time, no visible tremors.

## 2019-09-21 NOTE — ED Notes (Signed)
Negative COVID result faxed to [facility]. [phone].

## 2019-09-21 NOTE — Care Management (Signed)
Under review - [facility], [facility], [facility], [place] [facility], [facility], [facility], [facility], [facility], [facility], [facility], [facility], [facility], [facility], [facility], [facility], [facility], [facility], [facility], [facility], [facility], [facility], [facility], [facility], [facility], [facility], [facility], [facility], [place], [place], [facility], [place], [facility], [facility], [place]   

## 2019-09-21 NOTE — ED Notes (Signed)
Report called to Nira Conn, RN at [facility]
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
## 2019-09-21 NOTE — ED Notes (Signed)
Attempted calling report using different number. No answer.

## 2019-09-22 LAB — CBG MONITORING, ED: Glucose-Capillary: 90 mg/dL (ref 70–99)

## 2019-10-18 ENCOUNTER — Emergency Department (HOSPITAL_COMMUNITY)
Admission: EM | Admit: 2019-10-18 | Discharge: 2019-10-19 | Disposition: A | Discharge status: Home / Self Care | Payer: Medicare Other | Attending: Emergency Medicine | Admitting: Emergency Medicine

## 2019-10-18 ENCOUNTER — Other Ambulatory Visit: Payer: Self-pay

## 2019-10-18 ENCOUNTER — Emergency Department (HOSPITAL_COMMUNITY): Payer: Medicare Other

## 2019-10-18 DIAGNOSIS — I252 Old myocardial infarction: Secondary | ICD-10-CM | POA: Insufficient documentation

## 2019-10-18 DIAGNOSIS — Z79899 Other long term (current) drug therapy: Secondary | ICD-10-CM | POA: Diagnosis not present

## 2019-10-18 DIAGNOSIS — I10 Essential (primary) hypertension: Secondary | ICD-10-CM | POA: Diagnosis not present

## 2019-10-18 DIAGNOSIS — R197 Diarrhea, unspecified: Secondary | ICD-10-CM | POA: Insufficient documentation

## 2019-10-18 DIAGNOSIS — I2510 Atherosclerotic heart disease of native coronary artery without angina pectoris: Secondary | ICD-10-CM | POA: Insufficient documentation

## 2019-10-18 DIAGNOSIS — R531 Weakness: Secondary | ICD-10-CM | POA: Diagnosis not present

## 2019-10-18 DIAGNOSIS — E119 Type 2 diabetes mellitus without complications: Secondary | ICD-10-CM | POA: Insufficient documentation

## 2019-10-18 DIAGNOSIS — F17210 Nicotine dependence, cigarettes, uncomplicated: Secondary | ICD-10-CM | POA: Insufficient documentation

## 2019-10-18 LAB — COMPREHENSIVE METABOLIC PANEL
ALT: 97 U/L — ABNORMAL HIGH (ref 0–44)
AST: 218 U/L — ABNORMAL HIGH (ref 15–41)
Albumin: 3.5 g/dL (ref 3.5–5.0)
Alkaline Phosphatase: 181 U/L — ABNORMAL HIGH (ref 38–126)
Anion gap: 14 (ref 5–15)
BUN: 9 mg/dL (ref 6–20)
CO2: 20 mmol/L — ABNORMAL LOW (ref 22–32)
Calcium: 8.7 mg/dL — ABNORMAL LOW (ref 8.9–10.3)
Chloride: 98 mmol/L (ref 98–111)
Creatinine, Ser: 0.68 mg/dL (ref 0.61–1.24)
GFR calc Af Amer: >60 mL/min (ref >60)
GFR calc non Af Amer: >60 mL/min (ref >60)
Glucose, Bld: 151 mg/dL — ABNORMAL HIGH (ref 70–99)
Potassium: 3.6 mmol/L (ref 3.5–5.1)
Sodium: 132 mmol/L — ABNORMAL LOW (ref 135–145)
Total Bilirubin: 1.7 mg/dL — ABNORMAL HIGH (ref 0.3–1.2)
Total Protein: 7 g/dL (ref 6.5–8.1)

## 2019-10-18 LAB — CBC
HCT: 37.1 % — ABNORMAL LOW (ref 39.0–52.0)
Hemoglobin: 13.7 g/dL (ref 13.0–17.0)
MCH: 30.3 pg (ref 26.0–34.0)
MCHC: 36.9 g/dL — ABNORMAL HIGH (ref 30.0–36.0)
MCV: 82.1 fL (ref 80.0–100.0)
Platelets: 67 10*3/uL — ABNORMAL LOW (ref 150–400)
RBC: 4.52 MIL/uL (ref 4.22–5.81)
RDW: 18.5 % — ABNORMAL HIGH (ref 11.5–15.5)
WBC: 6 10*3/uL (ref 4.0–10.5)
nRBC: 0 % (ref 0.0–0.2)

## 2019-10-18 LAB — CBG MONITORING, ED: Glucose-Capillary: 178 mg/dL — ABNORMAL HIGH (ref 70–99)

## 2019-10-18 MED ORDER — SODIUM CHLORIDE 0.9% FLUSH
3.0000 mL | Freq: Once | INTRAVENOUS | Status: AC
  Administered 2019-10-18: 3 mL via INTRAVENOUS

## 2019-10-18 MED ORDER — THIAMINE HCL 100 MG/ML IJ SOLN
Freq: Once | INTRAVENOUS | Status: AC
  Administered 2019-10-19: 02:00:00 via INTRAVENOUS
  Filled 2019-10-18: qty 1000
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
## 2019-10-19 DIAGNOSIS — R531 Weakness: Secondary | ICD-10-CM | POA: Diagnosis not present

## 2019-10-19 LAB — URINALYSIS, ROUTINE W REFLEX MICROSCOPIC
Bilirubin Urine: NEGATIVE
Glucose, UA: NEGATIVE mg/dL
Hgb urine dipstick: NEGATIVE
Ketones, ur: NEGATIVE mg/dL
Leukocytes,Ua: NEGATIVE
Nitrite: NEGATIVE
Protein, ur: NEGATIVE mg/dL
Specific Gravity, Urine: 1.011 (ref 1.005–1.030)
pH: 5 (ref 5.0–8.0)

## 2019-10-19 LAB — TROPONIN I (HIGH SENSITIVITY)
Troponin I (High Sensitivity): 12 ng/L (ref <18)
Troponin I (High Sensitivity): 10 ng/L (ref <18)

## 2019-10-19 NOTE — ED Provider Notes (Signed)
Patient seen/examined in the Emergency Department in conjunction with Midlevel Provider Roman Forest Patient reports vomiting, diarrhea, weakness he reports feeling generalized weakness reduce increased sleepiness Exam : Awake alert, no acute distress.  No focal abdominal tenderness.  No focal weakness noted Plan: We will give IV fluids and reassess.   Ripley Fraise, MD 10/19/19 0005

## 2019-10-19 NOTE — ED Notes (Signed)
Pt ambulated in room. States feeling unsteady. Pt strong enough to move from chair to bed on his own.

## 2019-10-19 NOTE — Discharge Instructions (Addendum)
Push fluids. Recommend Imodium if having more than 5 bowel movements daily.   Follow up with [facility] for primary care concerns. REturn to the emergency department with new or concerning symptoms.

## 2019-10-19 NOTE — ED Notes (Signed)
Discharge instructions discussed with pt. Pt verbalized understanding with no questions at this time. Pt able to walk to wheelchair independently. Pt given meal to go. Pt to wait for bus.   No signature pad available for discharge

## 2019-11-01 ENCOUNTER — Encounter (HOSPITAL_COMMUNITY): Payer: Self-pay

## 2019-11-01 ENCOUNTER — Emergency Department (HOSPITAL_COMMUNITY)
Admission: EM | Admit: 2019-11-01 | Discharge: 2019-11-01 | Disposition: A | Discharge status: Home / Self Care | Payer: Medicare Other | Attending: Emergency Medicine | Admitting: Emergency Medicine

## 2019-11-01 DIAGNOSIS — R197 Diarrhea, unspecified: Secondary | ICD-10-CM | POA: Insufficient documentation

## 2019-11-01 DIAGNOSIS — Z5321 Procedure and treatment not carried out due to patient leaving prior to being seen by health care provider: Secondary | ICD-10-CM | POA: Diagnosis not present

## 2019-11-01 DIAGNOSIS — R0602 Shortness of breath: Secondary | ICD-10-CM | POA: Insufficient documentation

## 2019-11-01 DIAGNOSIS — R110 Nausea: Secondary | ICD-10-CM | POA: Insufficient documentation

## 2019-11-01 NOTE — ED Triage Notes (Signed)
Pt presents via EMS with c/o shortness of breath, nausea, diarrhea. Pt reports his symptoms have been going on since yesterday.

## 2019-12-29 ENCOUNTER — Other Ambulatory Visit: Payer: Self-pay

## 2019-12-29 ENCOUNTER — Encounter (HOSPITAL_COMMUNITY): Payer: Self-pay | Admitting: Emergency Medicine

## 2019-12-29 ENCOUNTER — Emergency Department (HOSPITAL_COMMUNITY)
Admission: EM | Admit: 2019-12-29 | Discharge: 2019-12-29 | Disposition: A | Discharge status: Home / Self Care | Payer: Medicare Other | Attending: Emergency Medicine | Admitting: Emergency Medicine

## 2019-12-29 ENCOUNTER — Emergency Department (HOSPITAL_COMMUNITY): Payer: Medicare Other

## 2019-12-29 DIAGNOSIS — S01111A Laceration without foreign body of right eyelid and periocular area, initial encounter: Secondary | ICD-10-CM | POA: Insufficient documentation

## 2019-12-29 DIAGNOSIS — I2510 Atherosclerotic heart disease of native coronary artery without angina pectoris: Secondary | ICD-10-CM | POA: Insufficient documentation

## 2019-12-29 DIAGNOSIS — Y929 Unspecified place or not applicable: Secondary | ICD-10-CM | POA: Diagnosis not present

## 2019-12-29 DIAGNOSIS — F17210 Nicotine dependence, cigarettes, uncomplicated: Secondary | ICD-10-CM | POA: Insufficient documentation

## 2019-12-29 DIAGNOSIS — Y939 Activity, unspecified: Secondary | ICD-10-CM | POA: Diagnosis not present

## 2019-12-29 DIAGNOSIS — Y999 Unspecified external cause status: Secondary | ICD-10-CM | POA: Insufficient documentation

## 2019-12-29 DIAGNOSIS — I252 Old myocardial infarction: Secondary | ICD-10-CM | POA: Insufficient documentation

## 2019-12-29 DIAGNOSIS — E119 Type 2 diabetes mellitus without complications: Secondary | ICD-10-CM | POA: Insufficient documentation

## 2019-12-29 DIAGNOSIS — I10 Essential (primary) hypertension: Secondary | ICD-10-CM | POA: Diagnosis not present

## 2019-12-29 DIAGNOSIS — F10220 Alcohol dependence with intoxication, uncomplicated: Secondary | ICD-10-CM | POA: Diagnosis not present

## 2019-12-29 DIAGNOSIS — F10920 Alcohol use, unspecified with intoxication, uncomplicated: Secondary | ICD-10-CM

## 2019-12-29 DIAGNOSIS — J449 Chronic obstructive pulmonary disease, unspecified: Secondary | ICD-10-CM | POA: Insufficient documentation

## 2019-12-29 DIAGNOSIS — W0110XA Fall on same level from slipping, tripping and stumbling with subsequent striking against unspecified object, initial encounter: Secondary | ICD-10-CM | POA: Diagnosis not present

## 2019-12-29 DIAGNOSIS — S0181XA Laceration without foreign body of other part of head, initial encounter: Secondary | ICD-10-CM

## 2019-12-29 MED ORDER — ACETAMINOPHEN 325 MG PO TABS
650.0000 mg | ORAL_TABLET | Freq: Once | ORAL | Status: AC
  Administered 2019-12-29: 10:00:00 650 mg via ORAL
  Filled 2019-12-29: qty 2
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
## 2019-12-29 NOTE — ED Notes (Signed)
Pt did not stay for DC vitals. Cussing at staff in doorway of room.

## 2019-12-29 NOTE — Discharge Instructions (Addendum)
Adult head injury: You have had a head injury which does not appear to require admission at this time. A concussion is a state of changed mental ability from trauma.  SEEK IMMEDIATE MEDICAL ATTENTION IF:  - There is confusion or drowsiness (although children frequently become drowsy after injury). You cannot awaken the injuredperson.   - There is nausea (feeling sick to your stomach) or continued, forceful vomiting. You notice dizziness or unsteadiness which is getting worse, or inability to walk.   - You have convulsions or unconsciousness.   - You experience severe, persistent headaches not relieved by Tylenol. (Do not take aspirin as this impairs clotting abilities). Take other pain medications only as directed.   - You cannot use arms or legs normally.   - There are changes in pupil sizes. (This is the black center in the colored part of the eye)   - There is clear or bloody discharge from the nose or ears.   - Change in speech, vision, swallowing, or understanding.   - Localized weakness, numbness, tingling, or change in bowel or bladder control.

## 2019-12-29 NOTE — ED Provider Notes (Signed)
Care assumed from C. Lawyer PA-C at shift change pending reassessment.  See his note for full H&P.   Briefly this is a 59 year old male presenting to the emergency department with alcohol intoxication and a fall.  He had a 3 cm laceration of his right eyebrow that was repaired with Dermabond.  CT head without acute abnormalities.  Plan is to ambulate patient and discharge when clinically sober.  Physical Exam  BP 99/62   Pulse 90   Temp 97.8 F (36.6 C) (Oral)   Resp 14   SpO2 98%   Physical Exam  PE: Constitutional: well-developed, well-nourished, no apparent distress HENT: normocephalic. no cervical adenopathy Cardiovascular: normal rate and rhythm, distal pulses intact Pulmonary/Chest: effort normal; breath sounds clear and equal bilaterally; no wheezes or rales Abdominal: soft and nontender Musculoskeletal: full ROM, no edema Neurological: alert with goal directed thinking Skin: warm and dry, no rash, no diaphoresis Psychiatric: normal mood and affect, normal behavior     ED Course/Procedures   No results found for this or any previous visit (from the past 24 hour(s)).  CT HEAD WITHOUT CONTRAST    TECHNIQUE:  Contiguous axial images were obtained from the base of the skull  through the vertex without intravenous contrast.    COMPARISON: Head CT 09/21/2019    FINDINGS:  Brain: No intracranial hemorrhage, mass effect, or midline shift.  Stable degree of atrophy. No hydrocephalus. The basilar cisterns are  patent. Mild chronic small vessel ischemia. No evidence of  territorial infarct or acute ischemia. No extra-axial or  intracranial fluid collection.    Vascular: Atherosclerosis of skullbase vasculature without  hyperdense vessel or abnormal calcification.    Skull: No fracture or focal lesion.    Sinuses/Orbits: Minor mucosal thickening of ethmoid air cells. No  sinus fluid level. Small mucous retention cyst in right maxillary  sinus. Mastoid air cells  are clear.    Other: Right frontal scalp laceration and small hematoma.    IMPRESSION:  1. Right frontal scalp laceration and small hematoma. No acute  intracranial abnormality. No skull fracture.  2. Stable atrophy and chronic small vessel ischemia.      Electronically Signed  By: Narda Rutherford M.D.  On: 12/29/2019 03:43    MDM   Patient received in signout.  He is able to ambulate with steady gait.  He appears to be clinically sober.  He is alert, has clear speech and goal oriented thinking.  Wound repair by previous provider.  Head CT negative.  The patient appears reasonably screened and/or stabilized for discharge and I doubt any other medical condition or other [facility] requiring further screening, evaluation, or treatment in the ED at this time prior to discharge. The patient is safe for discharge with strict return precautions discussed. Recommend pcp follow up.    Portions of this note were generated with Scientist, clinical (histocompatibility and immunogenetics). Dictation errors may occur despite best attempts at proofreading.       Kathyrn Lass 12/29/19 0934    Paula Libra, MD 12/29/19 [id number]

## 2020-02-08 ENCOUNTER — Emergency Department (HOSPITAL_COMMUNITY): Payer: Medicare Other

## 2020-02-08 ENCOUNTER — Other Ambulatory Visit: Payer: Self-pay

## 2020-02-08 ENCOUNTER — Encounter (HOSPITAL_COMMUNITY): Payer: Self-pay

## 2020-02-08 ENCOUNTER — Emergency Department (HOSPITAL_COMMUNITY)
Admission: EM | Admit: 2020-02-08 | Discharge: 2020-02-08 | Disposition: A | Discharge status: Home / Self Care | Payer: Medicare Other | Attending: Emergency Medicine | Admitting: Emergency Medicine

## 2020-02-08 DIAGNOSIS — R4182 Altered mental status, unspecified: Secondary | ICD-10-CM | POA: Diagnosis present

## 2020-02-08 DIAGNOSIS — I10 Essential (primary) hypertension: Secondary | ICD-10-CM | POA: Insufficient documentation

## 2020-02-08 DIAGNOSIS — Z590 Homelessness: Secondary | ICD-10-CM | POA: Diagnosis not present

## 2020-02-08 DIAGNOSIS — F1410 Cocaine abuse, uncomplicated: Secondary | ICD-10-CM | POA: Insufficient documentation

## 2020-02-08 DIAGNOSIS — J449 Chronic obstructive pulmonary disease, unspecified: Secondary | ICD-10-CM | POA: Insufficient documentation

## 2020-02-08 DIAGNOSIS — F29 Unspecified psychosis not due to a substance or known physiological condition: Secondary | ICD-10-CM | POA: Diagnosis not present

## 2020-02-08 DIAGNOSIS — E119 Type 2 diabetes mellitus without complications: Secondary | ICD-10-CM | POA: Diagnosis not present

## 2020-02-08 DIAGNOSIS — F17210 Nicotine dependence, cigarettes, uncomplicated: Secondary | ICD-10-CM | POA: Diagnosis not present

## 2020-02-08 DIAGNOSIS — I2510 Atherosclerotic heart disease of native coronary artery without angina pectoris: Secondary | ICD-10-CM | POA: Insufficient documentation

## 2020-02-08 DIAGNOSIS — R410 Disorientation, unspecified: Secondary | ICD-10-CM | POA: Diagnosis not present

## 2020-02-08 DIAGNOSIS — Y906 Blood alcohol level of 120-199 mg/100 ml: Secondary | ICD-10-CM | POA: Insufficient documentation

## 2020-02-08 DIAGNOSIS — J0100 Acute maxillary sinusitis, unspecified: Secondary | ICD-10-CM | POA: Diagnosis not present

## 2020-02-08 DIAGNOSIS — R001 Bradycardia, unspecified: Secondary | ICD-10-CM | POA: Diagnosis not present

## 2020-02-08 DIAGNOSIS — F10920 Alcohol use, unspecified with intoxication, uncomplicated: Secondary | ICD-10-CM | POA: Diagnosis not present

## 2020-02-08 DIAGNOSIS — I252 Old myocardial infarction: Secondary | ICD-10-CM | POA: Diagnosis not present

## 2020-02-08 DIAGNOSIS — R45850 Homicidal ideations: Secondary | ICD-10-CM | POA: Diagnosis not present

## 2020-02-08 DIAGNOSIS — R519 Headache, unspecified: Secondary | ICD-10-CM | POA: Diagnosis not present

## 2020-02-08 LAB — COMPREHENSIVE METABOLIC PANEL
ALT: 46 U/L — ABNORMAL HIGH (ref 0–44)
AST: 122 U/L — ABNORMAL HIGH (ref 15–41)
Albumin: 2.9 g/dL — ABNORMAL LOW (ref 3.5–5.0)
Alkaline Phosphatase: 189 U/L — ABNORMAL HIGH (ref 38–126)
Anion gap: 13 (ref 5–15)
BUN: 6 mg/dL (ref 6–20)
CO2: 24 mmol/L (ref 22–32)
Calcium: 8.5 mg/dL — ABNORMAL LOW (ref 8.9–10.3)
Chloride: 94 mmol/L — ABNORMAL LOW (ref 98–111)
Creatinine, Ser: 0.66 mg/dL (ref 0.61–1.24)
GFR calc Af Amer: >60 mL/min (ref >60)
GFR calc non Af Amer: >60 mL/min (ref >60)
Glucose, Bld: 128 mg/dL — ABNORMAL HIGH (ref 70–99)
Potassium: 3.7 mmol/L (ref 3.5–5.1)
Sodium: 131 mmol/L — ABNORMAL LOW (ref 135–145)
Total Bilirubin: 1.1 mg/dL (ref 0.3–1.2)
Total Protein: 6.6 g/dL (ref 6.5–8.1)

## 2020-02-08 LAB — URINALYSIS, ROUTINE W REFLEX MICROSCOPIC
Bilirubin Urine: NEGATIVE
Glucose, UA: NEGATIVE mg/dL
Hgb urine dipstick: NEGATIVE
Ketones, ur: NEGATIVE mg/dL
Leukocytes,Ua: NEGATIVE
Nitrite: NEGATIVE
Protein, ur: NEGATIVE mg/dL
Specific Gravity, Urine: 1.002 — ABNORMAL LOW (ref 1.005–1.030)
pH: 6 (ref 5.0–8.0)

## 2020-02-08 LAB — RAPID URINE DRUG SCREEN, HOSP PERFORMED
Amphetamines: NOT DETECTED
Barbiturates: NOT DETECTED
Benzodiazepines: NOT DETECTED
Cocaine: NOT DETECTED
Opiates: NOT DETECTED
Tetrahydrocannabinol: NOT DETECTED

## 2020-02-08 LAB — CBC WITH DIFFERENTIAL/PLATELET
Abs Immature Granulocytes: 0.03 10*3/uL (ref 0.00–0.07)
Basophils Absolute: 0 10*3/uL (ref 0.0–0.1)
Basophils Relative: 0 %
Eosinophils Absolute: 0 10*3/uL (ref 0.0–0.5)
Eosinophils Relative: 0 %
HCT: 38 % — ABNORMAL LOW (ref 39.0–52.0)
Hemoglobin: 13.8 g/dL (ref 13.0–17.0)
Immature Granulocytes: 0 %
Lymphocytes Relative: 39 %
Lymphs Abs: 3.2 10*3/uL (ref 0.7–4.0)
MCH: 33.3 pg (ref 26.0–34.0)
MCHC: 36.3 g/dL — ABNORMAL HIGH (ref 30.0–36.0)
MCV: 91.6 fL (ref 80.0–100.0)
Monocytes Absolute: 0.9 10*3/uL (ref 0.1–1.0)
Monocytes Relative: 11 %
Neutro Abs: 4.1 10*3/uL (ref 1.7–7.7)
Neutrophils Relative %: 50 %
Platelets: 47 10*3/uL — ABNORMAL LOW (ref 150–400)
RBC: 4.15 MIL/uL — ABNORMAL LOW (ref 4.22–5.81)
RDW: 15.5 % (ref 11.5–15.5)
WBC: 8.2 10*3/uL (ref 4.0–10.5)
nRBC: 0 % (ref 0.0–0.2)

## 2020-02-08 LAB — TROPONIN I (HIGH SENSITIVITY): Troponin I (High Sensitivity): 16 ng/L (ref <18)

## 2020-02-08 LAB — ETHANOL: Alcohol, Ethyl (B): 190 mg/dL — ABNORMAL HIGH (ref <10)

## 2020-02-08 MED ORDER — ACETAMINOPHEN 325 MG PO TABS
650.0000 mg | ORAL_TABLET | Freq: Once | ORAL | Status: AC
  Administered 2020-02-08: 22:00:00 650 mg via ORAL
  Filled 2020-02-08: qty 2

## 2020-02-08 MED ORDER — AMOXICILLIN-POT CLAVULANATE 875-125 MG PO TABS
1.0000 | ORAL_TABLET | Freq: Once | ORAL | Status: AC
  Administered 2020-02-08: 22:00:00 1 via ORAL
  Filled 2020-02-08: qty 1

## 2020-02-08 MED ORDER — FLUTICASONE PROPIONATE 50 MCG/ACT NA SUSP
1.0000 | Freq: Every day | NASAL | Status: DC
  Filled 2020-02-08: qty 16

## 2020-02-08 MED ORDER — AMOXICILLIN 500 MG PO CAPS: 500.0000 mg | ORAL_CAPSULE | Freq: Two times a day (BID) | ORAL | 0 refills | Status: DC

## 2020-02-08 NOTE — Discharge Instructions (Signed)
Start Amoxicillin twice daily for sinus infection Use Flonase daily for congestion Take Tylenol or Ibuprofen for headache
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
## 2020-02-08 NOTE — ED Triage Notes (Signed)
Patient states he is confused

## 2020-04-15 ENCOUNTER — Emergency Department (HOSPITAL_COMMUNITY)
Admission: EM | Admit: 2020-04-15 | Discharge: 2020-04-15 | Disposition: A | Discharge status: Home / Self Care | Payer: Medicare Other | Attending: Emergency Medicine | Admitting: Emergency Medicine

## 2020-04-15 ENCOUNTER — Encounter (HOSPITAL_COMMUNITY): Payer: Self-pay | Admitting: Emergency Medicine

## 2020-04-15 DIAGNOSIS — R109 Unspecified abdominal pain: Secondary | ICD-10-CM | POA: Insufficient documentation

## 2020-04-15 DIAGNOSIS — Z5321 Procedure and treatment not carried out due to patient leaving prior to being seen by health care provider: Secondary | ICD-10-CM | POA: Insufficient documentation

## 2020-04-15 DIAGNOSIS — I10 Essential (primary) hypertension: Secondary | ICD-10-CM | POA: Diagnosis not present

## 2020-04-15 DIAGNOSIS — R1084 Generalized abdominal pain: Secondary | ICD-10-CM | POA: Diagnosis not present

## 2020-04-15 DIAGNOSIS — R100 Acute abdomen: Secondary | ICD-10-CM | POA: Diagnosis not present

## 2020-04-15 DIAGNOSIS — R069 Unspecified abnormalities of breathing: Secondary | ICD-10-CM | POA: Diagnosis not present

## 2020-04-15 LAB — COMPREHENSIVE METABOLIC PANEL
ALT: 46 U/L — ABNORMAL HIGH (ref 0–44)
AST: 123 U/L — ABNORMAL HIGH (ref 15–41)
Albumin: 3.3 g/dL — ABNORMAL LOW (ref 3.5–5.0)
Alkaline Phosphatase: 190 U/L — ABNORMAL HIGH (ref 38–126)
Anion gap: 17 — ABNORMAL HIGH (ref 5–15)
BUN: 9 mg/dL (ref 6–20)
CO2: 20 mmol/L — ABNORMAL LOW (ref 22–32)
Calcium: 8.3 mg/dL — ABNORMAL LOW (ref 8.9–10.3)
Chloride: 103 mmol/L (ref 98–111)
Creatinine, Ser: 0.65 mg/dL (ref 0.61–1.24)
GFR calc Af Amer: >60 mL/min (ref >60)
GFR calc non Af Amer: >60 mL/min (ref >60)
Glucose, Bld: 141 mg/dL — ABNORMAL HIGH (ref 70–99)
Potassium: 3.5 mmol/L (ref 3.5–5.1)
Sodium: 140 mmol/L (ref 135–145)
Total Bilirubin: 0.8 mg/dL (ref 0.3–1.2)
Total Protein: 7.3 g/dL (ref 6.5–8.1)

## 2020-04-15 LAB — CBC
HCT: 37.7 % — ABNORMAL LOW (ref 39.0–52.0)
Hemoglobin: 13.2 g/dL (ref 13.0–17.0)
MCH: 34.6 pg — ABNORMAL HIGH (ref 26.0–34.0)
MCHC: 35 g/dL (ref 30.0–36.0)
MCV: 99 fL (ref 80.0–100.0)
Platelets: 49 10*3/uL — ABNORMAL LOW (ref 150–400)
RBC: 3.81 MIL/uL — ABNORMAL LOW (ref 4.22–5.81)
RDW: 17 % — ABNORMAL HIGH (ref 11.5–15.5)
WBC: 2.3 10*3/uL — ABNORMAL LOW (ref 4.0–10.5)
nRBC: 0 % (ref 0.0–0.2)

## 2020-04-15 LAB — LIPASE, BLOOD: Lipase: 20 U/L (ref 11–51)

## 2020-04-15 NOTE — ED Triage Notes (Signed)
Per EMS-intermittent abdominal pain since this am-denies N/V/D-noncompliant with EMS-20g left [facility]

## 2020-04-15 NOTE — ED Notes (Signed)
Patient refusing triage process, refusing EKG-demanding to drink water, verbally abusive to staff

## 2020-04-15 NOTE — ED Notes (Signed)
Pt walking in EMS [place] parking lot, drink soda.

## 2020-11-07 ENCOUNTER — Encounter: Payer: Self-pay | Admitting: General Practice

## 2020-11-10 ENCOUNTER — Encounter: Payer: Self-pay | Admitting: General Practice

## 2020-11-10 ENCOUNTER — Telehealth: Payer: Self-pay | Admitting: Interventional Cardiology

## 2020-11-10 NOTE — Telephone Encounter (Signed)
  Recall expunge letter sent for overdue f/u 

## 2021-01-20 ENCOUNTER — Emergency Department (HOSPITAL_COMMUNITY): Payer: Medicare Other

## 2021-01-20 ENCOUNTER — Emergency Department (HOSPITAL_COMMUNITY)
Admission: EM | Admit: 2021-01-20 | Discharge: 2021-01-20 | Disposition: A | Discharge status: Home / Self Care | Payer: Medicare Other | Attending: Emergency Medicine | Admitting: Emergency Medicine

## 2021-01-20 ENCOUNTER — Encounter (HOSPITAL_COMMUNITY): Payer: Self-pay | Admitting: Emergency Medicine

## 2021-01-20 DIAGNOSIS — F10129 Alcohol abuse with intoxication, unspecified: Secondary | ICD-10-CM | POA: Insufficient documentation

## 2021-01-20 DIAGNOSIS — Z5321 Procedure and treatment not carried out due to patient leaving prior to being seen by health care provider: Secondary | ICD-10-CM | POA: Insufficient documentation

## 2021-01-20 DIAGNOSIS — M25551 Pain in right hip: Secondary | ICD-10-CM | POA: Insufficient documentation

## 2021-01-20 NOTE — ED Triage Notes (Signed)
Per EMS-right hip pain for 2 months-no injury or trauma-unable to bear weight-has been drinking ETOH

## 2021-02-26 ENCOUNTER — Emergency Department (HOSPITAL_COMMUNITY)
Admission: EM | Admit: 2021-02-26 | Discharge: 2021-02-26 | Disposition: A | Discharge status: Home / Self Care | Payer: Medicare Other | Attending: Emergency Medicine | Admitting: Emergency Medicine

## 2021-02-26 ENCOUNTER — Emergency Department (HOSPITAL_COMMUNITY): Payer: Medicare Other

## 2021-02-26 ENCOUNTER — Encounter (HOSPITAL_COMMUNITY): Payer: Self-pay

## 2021-02-26 DIAGNOSIS — I10 Essential (primary) hypertension: Secondary | ICD-10-CM | POA: Diagnosis not present

## 2021-02-26 DIAGNOSIS — F17210 Nicotine dependence, cigarettes, uncomplicated: Secondary | ICD-10-CM | POA: Diagnosis not present

## 2021-02-26 DIAGNOSIS — W01198A Fall on same level from slipping, tripping and stumbling with subsequent striking against other object, initial encounter: Secondary | ICD-10-CM | POA: Diagnosis not present

## 2021-02-26 DIAGNOSIS — S0181XA Laceration without foreign body of other part of head, initial encounter: Secondary | ICD-10-CM | POA: Insufficient documentation

## 2021-02-26 DIAGNOSIS — E119 Type 2 diabetes mellitus without complications: Secondary | ICD-10-CM | POA: Diagnosis not present

## 2021-02-26 DIAGNOSIS — M542 Cervicalgia: Secondary | ICD-10-CM | POA: Insufficient documentation

## 2021-02-26 DIAGNOSIS — J449 Chronic obstructive pulmonary disease, unspecified: Secondary | ICD-10-CM | POA: Diagnosis not present

## 2021-02-26 DIAGNOSIS — R569 Unspecified convulsions: Secondary | ICD-10-CM | POA: Insufficient documentation

## 2021-02-26 DIAGNOSIS — I2510 Atherosclerotic heart disease of native coronary artery without angina pectoris: Secondary | ICD-10-CM | POA: Insufficient documentation

## 2021-02-26 DIAGNOSIS — Z79899 Other long term (current) drug therapy: Secondary | ICD-10-CM | POA: Diagnosis not present

## 2021-02-26 DIAGNOSIS — S0990XA Unspecified injury of head, initial encounter: Secondary | ICD-10-CM | POA: Diagnosis present

## 2021-02-26 LAB — COMPREHENSIVE METABOLIC PANEL
ALT: 20 U/L (ref 0–44)
AST: 45 U/L — ABNORMAL HIGH (ref 15–41)
Albumin: 3.2 g/dL — ABNORMAL LOW (ref 3.5–5.0)
Alkaline Phosphatase: 138 U/L — ABNORMAL HIGH (ref 38–126)
Anion gap: 10 (ref 5–15)
BUN: <5 mg/dL — ABNORMAL LOW (ref 6–20)
CO2: 22 mmol/L (ref 22–32)
Calcium: 8.5 mg/dL — ABNORMAL LOW (ref 8.9–10.3)
Chloride: 99 mmol/L (ref 98–111)
Creatinine, Ser: 0.48 mg/dL — ABNORMAL LOW (ref 0.61–1.24)
GFR, Estimated: >60 mL/min (ref >60)
Glucose, Bld: 106 mg/dL — ABNORMAL HIGH (ref 70–99)
Potassium: 3.6 mmol/L (ref 3.5–5.1)
Sodium: 131 mmol/L — ABNORMAL LOW (ref 135–145)
Total Bilirubin: 1.1 mg/dL (ref 0.3–1.2)
Total Protein: 7.3 g/dL (ref 6.5–8.1)

## 2021-02-26 LAB — CBC WITH DIFFERENTIAL/PLATELET
Abs Immature Granulocytes: 0.03 10*3/uL (ref 0.00–0.07)
Basophils Absolute: 0.1 10*3/uL (ref 0.0–0.1)
Basophils Relative: 1 %
Eosinophils Absolute: 0.2 10*3/uL (ref 0.0–0.5)
Eosinophils Relative: 2 %
HCT: 37.2 % — ABNORMAL LOW (ref 39.0–52.0)
Hemoglobin: 13.1 g/dL (ref 13.0–17.0)
Immature Granulocytes: 0 %
Lymphocytes Relative: 29 %
Lymphs Abs: 2.8 10*3/uL (ref 0.7–4.0)
MCH: 33.5 pg (ref 26.0–34.0)
MCHC: 35.2 g/dL (ref 30.0–36.0)
MCV: 95.1 fL (ref 80.0–100.0)
Monocytes Absolute: 1.1 10*3/uL — ABNORMAL HIGH (ref 0.1–1.0)
Monocytes Relative: 12 %
Neutro Abs: 5.5 10*3/uL (ref 1.7–7.7)
Neutrophils Relative %: 56 %
Platelets: 107 10*3/uL — ABNORMAL LOW (ref 150–400)
RBC: 3.91 MIL/uL — ABNORMAL LOW (ref 4.22–5.81)
RDW: 14.6 % (ref 11.5–15.5)
WBC: 9.7 10*3/uL (ref 4.0–10.5)
nRBC: 0 % (ref 0.0–0.2)

## 2021-02-26 LAB — ETHANOL: Alcohol, Ethyl (B): 178 mg/dL — ABNORMAL HIGH (ref <10)

## 2021-02-26 MED ORDER — SODIUM CHLORIDE 0.9 % IV BOLUS
1000.0000 mL | Freq: Once | INTRAVENOUS | Status: AC
  Administered 2021-02-26: 1000 mL via INTRAVENOUS

## 2021-02-26 MED ORDER — LIDOCAINE-EPINEPHRINE-TETRACAINE (LET) TOPICAL GEL
3.0000 mL | Freq: Once | TOPICAL | Status: AC
  Administered 2021-02-26: 3 mL via TOPICAL
  Filled 2021-02-26: qty 3

## 2021-02-26 MED ORDER — ACETAMINOPHEN 325 MG PO TABS
650.0000 mg | ORAL_TABLET | Freq: Once | ORAL | Status: DC
  Filled 2021-02-26: qty 2

## 2021-02-26 MED ORDER — FENTANYL CITRATE (PF) 100 MCG/2ML IJ SOLN
50.0000 ug | Freq: Once | INTRAMUSCULAR | Status: AC
  Administered 2021-02-26: 50 ug via INTRAVENOUS
  Filled 2021-02-26: qty 2

## 2021-02-26 MED ORDER — LIDOCAINE-EPINEPHRINE 1 %-1:100000 IJ SOLN
10.0000 mL | Freq: Once | INTRAMUSCULAR | Status: AC
  Administered 2021-02-26: 10 mL
  Filled 2021-02-26: qty 1

## 2021-02-26 NOTE — ED Triage Notes (Signed)
c collar in place
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
## 2021-02-26 NOTE — ED Notes (Signed)
Patient removed c-collar.  

## 2021-02-26 NOTE — ED Triage Notes (Signed)
Medic VS-BP 111/72, 02 sats 96%, HR 97, RR 16, CBG 157

## 2021-02-26 NOTE — ED Provider Notes (Cosign Needed)
I was requested to perform a laceration repair  .Marland KitchenLaceration Repair  Date/Time: 02/26/2021 9:56 PM Performed by: Fayrene Helper, PA-C Authorized by: Fayrene Helper, PA-C   Consent:    Consent obtained:  Verbal   Consent given by:  Patient   Risks discussed:  Infection, need for additional repair, pain, poor cosmetic result and poor wound healing   Alternatives discussed:  No treatment and delayed treatment Universal protocol:    Procedure explained and questions answered to patient or proxy's satisfaction: yes     Relevant documents present and verified: yes     Test results available: yes     Imaging studies available: yes     Required blood products, implants, devices, and special equipment available: yes     Site/side marked: yes     Immediately prior to procedure, a time out was called: yes     Patient identity confirmed:  Verbally with patient Anesthesia:    Anesthesia method:  Local infiltration   Local anesthetic:  Lidocaine 2% WITH epi Laceration details:    Location:  Face   Face location:  Forehead   Length (cm):  5   Depth (mm):  5 Pre-procedure details:    Preparation:  Patient was prepped and draped in usual sterile fashion and imaging obtained to evaluate for foreign bodies Exploration:    Limited defect created (wound extended): no     Hemostasis achieved with:  Epinephrine   Imaging outcome: foreign body not noted     Wound exploration: wound explored through full range of motion and entire depth of wound visualized     Wound extent: no muscle damage noted, no underlying fracture noted and no vascular damage noted     Contaminated: no   Treatment:    Area cleansed with:  Saline and povidone-iodine   Amount of cleaning:  Standard   Irrigation solution:  Sterile saline   Irrigation method:  Pressure wash   Visualized foreign bodies/material removed: yes     Debridement:  Minimal   Undermining:  None   Scar revision: no   Skin repair:    Repair method:  Sutures    Suture size:  5-0   Suture material:  Prolene   Suture technique:  Simple interrupted   Number of sutures:  7 Approximation:    Approximation:  Close Repair type:    Repair type:  Intermediate Post-procedure details:    Dressing:  Non-adherent dressing   Procedure completion:  Tolerated well, no immediate complications      Fayrene Helper, PA-C 02/26/21 2157
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
## 2021-02-26 NOTE — ED Notes (Signed)
  Patient ambulated in the hallway with no assistance.  No abnormal gait or dizziness.  Dr. Fredderick Phenix notified.

## 2021-02-26 NOTE — ED Notes (Signed)
Pt took c-collar off

## 2021-02-26 NOTE — ED Notes (Signed)
Urinal provided and the pt encouraged that the doctor would be in soon.

## 2021-03-06 ENCOUNTER — Emergency Department (HOSPITAL_COMMUNITY)
Admission: EM | Admit: 2021-03-06 | Discharge: 2021-03-06 | Disposition: A | Discharge status: Home / Self Care | Payer: Medicare Other | Attending: Emergency Medicine | Admitting: Emergency Medicine

## 2021-03-06 DIAGNOSIS — R251 Tremor, unspecified: Secondary | ICD-10-CM | POA: Insufficient documentation

## 2021-03-06 DIAGNOSIS — Z5321 Procedure and treatment not carried out due to patient leaving prior to being seen by health care provider: Secondary | ICD-10-CM | POA: Insufficient documentation

## 2021-03-06 NOTE — ED Triage Notes (Signed)
Per EMS- patient was picked up at a bus stop today. Patient had by standers call. Patient c/o tremors. Patient reported to EMS that he drinks daily and may have had a seizure.  EMS asked if he got seizures when he stopped drinking and patient stated, "how would I know if I drink every day."

## 2021-03-18 ENCOUNTER — Encounter (HOSPITAL_COMMUNITY): Payer: Self-pay

## 2021-03-18 ENCOUNTER — Emergency Department (HOSPITAL_COMMUNITY)
Admission: EM | Admit: 2021-03-18 | Discharge: 2021-03-19 | Disposition: A | Discharge status: Home / Self Care | Payer: Medicare Other | Attending: Emergency Medicine | Admitting: Emergency Medicine

## 2021-03-18 ENCOUNTER — Other Ambulatory Visit: Payer: Self-pay

## 2021-03-18 DIAGNOSIS — F10129 Alcohol abuse with intoxication, unspecified: Secondary | ICD-10-CM | POA: Diagnosis not present

## 2021-03-18 DIAGNOSIS — R6889 Other general symptoms and signs: Secondary | ICD-10-CM

## 2021-03-18 DIAGNOSIS — I10 Essential (primary) hypertension: Secondary | ICD-10-CM | POA: Insufficient documentation

## 2021-03-18 DIAGNOSIS — R4020 Unspecified coma: Secondary | ICD-10-CM

## 2021-03-18 DIAGNOSIS — R55 Syncope and collapse: Secondary | ICD-10-CM | POA: Insufficient documentation

## 2021-03-18 DIAGNOSIS — I2510 Atherosclerotic heart disease of native coronary artery without angina pectoris: Secondary | ICD-10-CM | POA: Diagnosis not present

## 2021-03-18 DIAGNOSIS — J449 Chronic obstructive pulmonary disease, unspecified: Secondary | ICD-10-CM | POA: Diagnosis not present

## 2021-03-18 DIAGNOSIS — E119 Type 2 diabetes mellitus without complications: Secondary | ICD-10-CM | POA: Insufficient documentation

## 2021-03-18 DIAGNOSIS — R41840 Attention and concentration deficit: Secondary | ICD-10-CM | POA: Insufficient documentation

## 2021-03-18 DIAGNOSIS — Z9861 Coronary angioplasty status: Secondary | ICD-10-CM | POA: Diagnosis not present

## 2021-03-18 DIAGNOSIS — F17210 Nicotine dependence, cigarettes, uncomplicated: Secondary | ICD-10-CM | POA: Diagnosis not present

## 2021-03-18 NOTE — ED Triage Notes (Signed)
Pt arrives EMS from off the street with c/o feeling like he may have a seizure. Has been drinking all day.

## 2021-03-19 DIAGNOSIS — F10129 Alcohol abuse with intoxication, unspecified: Secondary | ICD-10-CM | POA: Diagnosis not present

## 2021-03-19 LAB — CBC WITH DIFFERENTIAL/PLATELET
Abs Immature Granulocytes: 0.02 10*3/uL (ref 0.00–0.07)
Basophils Absolute: 0.1 10*3/uL (ref 0.0–0.1)
Basophils Relative: 2 %
Eosinophils Absolute: 0.2 10*3/uL (ref 0.0–0.5)
Eosinophils Relative: 3 %
HCT: 40.2 % (ref 39.0–52.0)
Hemoglobin: 13 g/dL (ref 13.0–17.0)
Immature Granulocytes: 0 %
Lymphocytes Relative: 38 %
Lymphs Abs: 3.1 10*3/uL (ref 0.7–4.0)
MCH: 33 pg (ref 26.0–34.0)
MCHC: 32.3 g/dL (ref 30.0–36.0)
MCV: 102 fL — ABNORMAL HIGH (ref 80.0–100.0)
Monocytes Absolute: 1.1 10*3/uL — ABNORMAL HIGH (ref 0.1–1.0)
Monocytes Relative: 13 %
Neutro Abs: 3.7 10*3/uL (ref 1.7–7.7)
Neutrophils Relative %: 44 %
Platelets: 124 10*3/uL — ABNORMAL LOW (ref 150–400)
RBC: 3.94 MIL/uL — ABNORMAL LOW (ref 4.22–5.81)
RDW: 15.5 % (ref 11.5–15.5)
WBC: 8.2 10*3/uL (ref 4.0–10.5)
nRBC: 0 % (ref 0.0–0.2)

## 2021-03-19 LAB — BASIC METABOLIC PANEL
Anion gap: 11 (ref 5–15)
BUN: 5 mg/dL — ABNORMAL LOW (ref 6–20)
CO2: 24 mmol/L (ref 22–32)
Calcium: 8.2 mg/dL — ABNORMAL LOW (ref 8.9–10.3)
Chloride: 101 mmol/L (ref 98–111)
Creatinine, Ser: 0.6 mg/dL — ABNORMAL LOW (ref 0.61–1.24)
GFR, Estimated: >60 mL/min (ref >60)
Glucose, Bld: 111 mg/dL — ABNORMAL HIGH (ref 70–99)
Potassium: 3.9 mmol/L (ref 3.5–5.1)
Sodium: 136 mmol/L (ref 135–145)

## 2021-03-19 MED ORDER — CHLORDIAZEPOXIDE HCL 25 MG PO CAPS
100.0000 mg | ORAL_CAPSULE | Freq: Once | ORAL | Status: AC
  Administered 2021-03-19: 100 mg via ORAL
  Filled 2021-03-19: qty 4
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
## 2021-03-19 NOTE — Discharge Instructions (Addendum)
Neurology should give you a call.  If they have not called you by Friday then give their office a call and see when they can get you into the office.  If you are worried about having seizures then do not do anything that could cause any serious bodily harm including driving climbing to tall heights operating heavy machinery swimming.

## 2021-03-24 ENCOUNTER — Encounter (HOSPITAL_COMMUNITY): Payer: Self-pay

## 2021-03-24 ENCOUNTER — Emergency Department (HOSPITAL_COMMUNITY)
Admission: EM | Admit: 2021-03-24 | Discharge: 2021-03-24 | Disposition: A | Discharge status: Home / Self Care | Payer: Medicare Other | Attending: Emergency Medicine | Admitting: Emergency Medicine

## 2021-03-24 ENCOUNTER — Emergency Department (HOSPITAL_COMMUNITY): Payer: Medicare Other

## 2021-03-24 DIAGNOSIS — S0101XA Laceration without foreign body of scalp, initial encounter: Secondary | ICD-10-CM | POA: Diagnosis not present

## 2021-03-24 DIAGNOSIS — J449 Chronic obstructive pulmonary disease, unspecified: Secondary | ICD-10-CM | POA: Diagnosis not present

## 2021-03-24 DIAGNOSIS — Y9289 Other specified places as the place of occurrence of the external cause: Secondary | ICD-10-CM | POA: Diagnosis not present

## 2021-03-24 DIAGNOSIS — J45909 Unspecified asthma, uncomplicated: Secondary | ICD-10-CM | POA: Diagnosis not present

## 2021-03-24 DIAGNOSIS — Z4802 Encounter for removal of sutures: Secondary | ICD-10-CM | POA: Diagnosis not present

## 2021-03-24 DIAGNOSIS — I10 Essential (primary) hypertension: Secondary | ICD-10-CM | POA: Diagnosis not present

## 2021-03-24 DIAGNOSIS — W19XXXA Unspecified fall, initial encounter: Secondary | ICD-10-CM

## 2021-03-24 DIAGNOSIS — E119 Type 2 diabetes mellitus without complications: Secondary | ICD-10-CM | POA: Diagnosis not present

## 2021-03-24 DIAGNOSIS — I2510 Atherosclerotic heart disease of native coronary artery without angina pectoris: Secondary | ICD-10-CM | POA: Diagnosis not present

## 2021-03-24 DIAGNOSIS — F17210 Nicotine dependence, cigarettes, uncomplicated: Secondary | ICD-10-CM | POA: Insufficient documentation

## 2021-03-24 DIAGNOSIS — S0990XA Unspecified injury of head, initial encounter: Secondary | ICD-10-CM | POA: Diagnosis present

## 2021-03-24 DIAGNOSIS — W01198A Fall on same level from slipping, tripping and stumbling with subsequent striking against other object, initial encounter: Secondary | ICD-10-CM | POA: Insufficient documentation

## 2021-03-24 MED ORDER — LIDOCAINE HCL (PF) 1 % IJ SOLN
5.0000 mL | Freq: Once | INTRAMUSCULAR | Status: AC
  Administered 2021-03-24: 5 mL
  Filled 2021-03-24: qty 5

## 2021-03-24 MED ORDER — OXYCODONE HCL 5 MG PO TABS
5.0000 mg | ORAL_TABLET | Freq: Once | ORAL | Status: AC
  Administered 2021-03-24: 5 mg via ORAL
  Filled 2021-03-24: qty 1

## 2021-03-24 NOTE — Discharge Instructions (Signed)
Return for staple removal in 10 days or go to urgent care for same. Follow-up with neurology as previously referred for your shaking episodes. Consider reviewing resource guide for outpatient counseling resources for alcohol use.

## 2021-03-24 NOTE — ED Notes (Signed)
ED Provider at bedside for laceration repair.  
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
## 2021-03-24 NOTE — ED Triage Notes (Signed)
Witnessed by friend. Patient started shaking, fell backward from standing and hit back of head on cement , Shaking stopped after fell and patient did not have LOC per friend. States patient walked a long distance after getting up form fall. Not found post ictal by medic. hematoma and lac to back of head. patient stated he drank a 40 oz beer prior to falling.

## 2021-03-24 NOTE — ED Triage Notes (Signed)
BP 130/70, HR 94, RR 24, 02  97% RA, CBG 146

## 2021-03-24 NOTE — ED Notes (Signed)
ED Provider at bedside. 

## 2021-04-07 ENCOUNTER — Encounter (HOSPITAL_COMMUNITY): Payer: Self-pay

## 2021-04-07 ENCOUNTER — Emergency Department (HOSPITAL_COMMUNITY)
Admission: EM | Admit: 2021-04-07 | Discharge: 2021-04-07 | Disposition: A | Discharge status: Home / Self Care | Payer: Medicare Other | Attending: Emergency Medicine | Admitting: Emergency Medicine

## 2021-04-07 ENCOUNTER — Other Ambulatory Visit: Payer: Self-pay

## 2021-04-07 DIAGNOSIS — E119 Type 2 diabetes mellitus without complications: Secondary | ICD-10-CM | POA: Diagnosis not present

## 2021-04-07 DIAGNOSIS — F17210 Nicotine dependence, cigarettes, uncomplicated: Secondary | ICD-10-CM | POA: Diagnosis not present

## 2021-04-07 DIAGNOSIS — I10 Essential (primary) hypertension: Secondary | ICD-10-CM | POA: Diagnosis not present

## 2021-04-07 DIAGNOSIS — I2510 Atherosclerotic heart disease of native coronary artery without angina pectoris: Secondary | ICD-10-CM | POA: Insufficient documentation

## 2021-04-07 DIAGNOSIS — J45909 Unspecified asthma, uncomplicated: Secondary | ICD-10-CM | POA: Insufficient documentation

## 2021-04-07 DIAGNOSIS — S0101XD Laceration without foreign body of scalp, subsequent encounter: Secondary | ICD-10-CM | POA: Diagnosis not present

## 2021-04-07 DIAGNOSIS — X58XXXD Exposure to other specified factors, subsequent encounter: Secondary | ICD-10-CM | POA: Diagnosis not present

## 2021-04-07 DIAGNOSIS — Z4802 Encounter for removal of sutures: Secondary | ICD-10-CM | POA: Insufficient documentation

## 2021-04-07 DIAGNOSIS — J449 Chronic obstructive pulmonary disease, unspecified: Secondary | ICD-10-CM | POA: Diagnosis not present

## 2021-04-07 NOTE — ED Triage Notes (Signed)
Pt bib gems for staple removal from head. Pt states they were placed two weeks ago, denies fever, pain, swelling or drainage.

## 2021-04-07 NOTE — Discharge Instructions (Addendum)
Your staples were removed, please continue to keep the wound clean, you may rinse out the wound twice a day.  You may take over-the-counter pain medication ibuprofen or Tylenol every 6 hours as needed please follow dosing on back of bottle.  Please follow-up with your PCP for further evaluation , if you do not have one, I have  given you the contact information for one above.   Come back to the emergency department if you develop chest pain, shortness of breath, severe abdominal pain, uncontrolled nausea, vomiting, diarrhea.
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
## 2021-06-04 ENCOUNTER — Emergency Department (HOSPITAL_COMMUNITY): Payer: Medicare Other

## 2021-06-04 ENCOUNTER — Encounter (HOSPITAL_COMMUNITY): Payer: Self-pay

## 2021-06-04 ENCOUNTER — Other Ambulatory Visit: Payer: Self-pay

## 2021-06-04 ENCOUNTER — Emergency Department (HOSPITAL_COMMUNITY)
Admission: EM | Admit: 2021-06-04 | Discharge: 2021-06-05 | Disposition: A | Discharge status: Home / Self Care | Payer: Medicare Other | Attending: Emergency Medicine | Admitting: Emergency Medicine

## 2021-06-04 DIAGNOSIS — J449 Chronic obstructive pulmonary disease, unspecified: Secondary | ICD-10-CM | POA: Insufficient documentation

## 2021-06-04 DIAGNOSIS — N492 Inflammatory disorders of scrotum: Secondary | ICD-10-CM | POA: Insufficient documentation

## 2021-06-04 DIAGNOSIS — Z9861 Coronary angioplasty status: Secondary | ICD-10-CM | POA: Diagnosis not present

## 2021-06-04 DIAGNOSIS — L03818 Cellulitis of other sites: Secondary | ICD-10-CM

## 2021-06-04 DIAGNOSIS — N5082 Scrotal pain: Secondary | ICD-10-CM | POA: Diagnosis present

## 2021-06-04 DIAGNOSIS — F17210 Nicotine dependence, cigarettes, uncomplicated: Secondary | ICD-10-CM | POA: Diagnosis not present

## 2021-06-04 DIAGNOSIS — I2510 Atherosclerotic heart disease of native coronary artery without angina pectoris: Secondary | ICD-10-CM | POA: Diagnosis not present

## 2021-06-04 DIAGNOSIS — I10 Essential (primary) hypertension: Secondary | ICD-10-CM | POA: Diagnosis not present

## 2021-06-04 DIAGNOSIS — E1140 Type 2 diabetes mellitus with diabetic neuropathy, unspecified: Secondary | ICD-10-CM | POA: Insufficient documentation

## 2021-06-04 DIAGNOSIS — J45909 Unspecified asthma, uncomplicated: Secondary | ICD-10-CM | POA: Insufficient documentation

## 2021-06-04 DIAGNOSIS — I252 Old myocardial infarction: Secondary | ICD-10-CM | POA: Diagnosis not present

## 2021-06-04 DIAGNOSIS — B356 Tinea cruris: Secondary | ICD-10-CM | POA: Diagnosis not present

## 2021-06-04 LAB — CBC WITH DIFFERENTIAL/PLATELET
Abs Immature Granulocytes: 0.04 10*3/uL (ref 0.00–0.07)
Basophils Absolute: 0.1 10*3/uL (ref 0.0–0.1)
Basophils Relative: 1 %
Eosinophils Absolute: 0.3 10*3/uL (ref 0.0–0.5)
Eosinophils Relative: 4 %
HCT: 37.7 % — ABNORMAL LOW (ref 39.0–52.0)
Hemoglobin: 13 g/dL (ref 13.0–17.0)
Immature Granulocytes: 0 %
Lymphocytes Relative: 23 %
Lymphs Abs: 2.2 10*3/uL (ref 0.7–4.0)
MCH: 33 pg (ref 26.0–34.0)
MCHC: 34.5 g/dL (ref 30.0–36.0)
MCV: 95.7 fL (ref 80.0–100.0)
Monocytes Absolute: 1.3 10*3/uL — ABNORMAL HIGH (ref 0.1–1.0)
Monocytes Relative: 14 %
Neutro Abs: 5.3 10*3/uL (ref 1.7–7.7)
Neutrophils Relative %: 58 %
Platelets: 94 10*3/uL — ABNORMAL LOW (ref 150–400)
RBC: 3.94 MIL/uL — ABNORMAL LOW (ref 4.22–5.81)
RDW: 17 % — ABNORMAL HIGH (ref 11.5–15.5)
WBC: 9.3 10*3/uL (ref 4.0–10.5)
nRBC: 0 % (ref 0.0–0.2)

## 2021-06-04 LAB — COMPREHENSIVE METABOLIC PANEL
ALT: 24 U/L (ref 0–44)
AST: 78 U/L — ABNORMAL HIGH (ref 15–41)
Albumin: 3.6 g/dL (ref 3.5–5.0)
Alkaline Phosphatase: 132 U/L — ABNORMAL HIGH (ref 38–126)
Anion gap: 10 (ref 5–15)
BUN: <5 mg/dL — ABNORMAL LOW (ref 6–20)
CO2: 24 mmol/L (ref 22–32)
Calcium: 8.6 mg/dL — ABNORMAL LOW (ref 8.9–10.3)
Chloride: 96 mmol/L — ABNORMAL LOW (ref 98–111)
Creatinine, Ser: 0.49 mg/dL — ABNORMAL LOW (ref 0.61–1.24)
GFR, Estimated: >60 mL/min (ref >60)
Glucose, Bld: 117 mg/dL — ABNORMAL HIGH (ref 70–99)
Potassium: 3.5 mmol/L (ref 3.5–5.1)
Sodium: 130 mmol/L — ABNORMAL LOW (ref 135–145)
Total Bilirubin: 2.1 mg/dL — ABNORMAL HIGH (ref 0.3–1.2)
Total Protein: 7.6 g/dL (ref 6.5–8.1)

## 2021-06-04 LAB — PROTIME-INR
INR: 1.6 — ABNORMAL HIGH (ref 0.8–1.2)
Prothrombin Time: 18.7 seconds — ABNORMAL HIGH (ref 11.4–15.2)

## 2021-06-04 LAB — ETHANOL: Alcohol, Ethyl (B): 244 mg/dL — ABNORMAL HIGH (ref <10)

## 2021-06-04 MED ORDER — AMOXICILLIN-POT CLAVULANATE 875-125 MG PO TABS: 1.0000 | ORAL_TABLET | Freq: Two times a day (BID) | ORAL | 0 refills | Status: DC

## 2021-06-04 MED ORDER — AMOXICILLIN-POT CLAVULANATE 875-125 MG PO TABS
1.0000 | ORAL_TABLET | Freq: Once | ORAL | Status: AC
  Administered 2021-06-05: 1 via ORAL
  Filled 2021-06-04: qty 1

## 2021-06-04 MED ORDER — NYSTATIN 100000 UNIT/GM EX OINT
TOPICAL_OINTMENT | Freq: Three times a day (TID) | CUTANEOUS | 0 refills | Status: DC
Start: 2021-06-04 — End: 2021-06-26

## 2021-06-04 MED ORDER — NYSTATIN 100000 UNIT/GM EX POWD
Freq: Once | CUTANEOUS | Status: AC
  Filled 2021-06-04: qty 15

## 2021-06-04 MED ORDER — FENTANYL CITRATE (PF) 100 MCG/2ML IJ SOLN
INTRAMUSCULAR | Status: AC
  Administered 2021-06-04: 50 ug via INTRAVENOUS
  Filled 2021-06-04: qty 2

## 2021-06-04 MED ORDER — LIDOCAINE HCL URETHRAL/MUCOSAL 2 % EX GEL: 1.0000 "application " | Freq: Three times a day (TID) | CUTANEOUS | 0 refills | Status: DC | PRN

## 2021-06-04 MED ORDER — LIDOCAINE HCL URETHRAL/MUCOSAL 2 % EX GEL
1.0000 "application " | Freq: Once | CUTANEOUS | Status: AC
  Administered 2021-06-05: 1 via TOPICAL
  Filled 2021-06-04: qty 11

## 2021-06-04 MED ORDER — FENTANYL CITRATE (PF) 100 MCG/2ML IJ SOLN: 50.0000 ug | Freq: Once | INTRAMUSCULAR | Status: AC

## 2021-06-04 NOTE — ED Triage Notes (Signed)
Patient BIB [facility] patient felt like he was peeing on himself but it was blood coming out. It is coming out of his scrotum.   EMS vitals 150/88 HR 88 CBG 146 97% Room Air
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
## 2021-06-05 DIAGNOSIS — N492 Inflammatory disorders of scrotum: Secondary | ICD-10-CM | POA: Diagnosis not present

## 2021-06-14 ENCOUNTER — Encounter (HOSPITAL_COMMUNITY): Payer: Self-pay

## 2021-06-14 ENCOUNTER — Emergency Department (HOSPITAL_COMMUNITY)
Admission: EM | Admit: 2021-06-14 | Discharge: 2021-06-14 | Discharge status: Against Medical Advice | Payer: Medicare Other | Source: Home / Self Care | Attending: Emergency Medicine | Admitting: Emergency Medicine

## 2021-06-14 ENCOUNTER — Other Ambulatory Visit: Payer: Self-pay

## 2021-06-14 DIAGNOSIS — W01198A Fall on same level from slipping, tripping and stumbling with subsequent striking against other object, initial encounter: Secondary | ICD-10-CM | POA: Insufficient documentation

## 2021-06-14 DIAGNOSIS — S0083XA Contusion of other part of head, initial encounter: Secondary | ICD-10-CM | POA: Insufficient documentation

## 2021-06-14 DIAGNOSIS — J45909 Unspecified asthma, uncomplicated: Secondary | ICD-10-CM | POA: Insufficient documentation

## 2021-06-14 DIAGNOSIS — E119 Type 2 diabetes mellitus without complications: Secondary | ICD-10-CM | POA: Insufficient documentation

## 2021-06-14 DIAGNOSIS — I10 Essential (primary) hypertension: Secondary | ICD-10-CM | POA: Insufficient documentation

## 2021-06-14 DIAGNOSIS — J449 Chronic obstructive pulmonary disease, unspecified: Secondary | ICD-10-CM | POA: Insufficient documentation

## 2021-06-14 DIAGNOSIS — F17210 Nicotine dependence, cigarettes, uncomplicated: Secondary | ICD-10-CM | POA: Insufficient documentation

## 2021-06-14 DIAGNOSIS — I2510 Atherosclerotic heart disease of native coronary artery without angina pectoris: Secondary | ICD-10-CM | POA: Insufficient documentation

## 2021-06-14 DIAGNOSIS — S0990XA Unspecified injury of head, initial encounter: Secondary | ICD-10-CM

## 2021-06-14 MED ORDER — ACETAMINOPHEN 325 MG PO TABS
650.0000 mg | ORAL_TABLET | Freq: Once | ORAL | Status: AC
  Administered 2021-06-14: 650 mg via ORAL
  Filled 2021-06-14: qty 2

## 2021-06-14 NOTE — ED Notes (Signed)
Warm blanket provided to pt.

## 2021-06-14 NOTE — ED Triage Notes (Signed)
B[facility] for [facility] reported fall. Pt states he hit his head and has bilateral hip pain. Pt states hx of seizures and he "might have had a seizure today" No seizure was witnessed. ETOH on board.
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
## 2021-06-14 NOTE — ED Notes (Signed)
Patient wanted to leave against medical advice. Patient advised to wait but patient stated "Im out of here, Im leaving" Patient signed AMA.

## 2021-06-14 NOTE — ED Notes (Signed)
Pt ambulatory to bathroom

## 2021-06-14 NOTE — ED Notes (Signed)
Pt ambulated to restroom with stand by assistance

## 2021-06-16 ENCOUNTER — Emergency Department (HOSPITAL_COMMUNITY): Payer: Medicare Other

## 2021-06-16 ENCOUNTER — Encounter (HOSPITAL_COMMUNITY): Payer: Self-pay | Admitting: Emergency Medicine

## 2021-06-16 ENCOUNTER — Other Ambulatory Visit: Payer: Self-pay

## 2021-06-16 ENCOUNTER — Inpatient Hospital Stay (HOSPITAL_COMMUNITY)
Admission: EM | Admit: 2021-06-16 | Discharge: 2021-06-26 | DRG: 897 | Disposition: A | Discharge status: Home / Self Care | Payer: Medicare Other | Attending: Internal Medicine | Admitting: Internal Medicine

## 2021-06-16 DIAGNOSIS — Z8249 Family history of ischemic heart disease and other diseases of the circulatory system: Secondary | ICD-10-CM

## 2021-06-16 DIAGNOSIS — L8990 Pressure ulcer of unspecified site, unspecified stage: Secondary | ICD-10-CM | POA: Insufficient documentation

## 2021-06-16 DIAGNOSIS — G609 Hereditary and idiopathic neuropathy, unspecified: Secondary | ICD-10-CM | POA: Diagnosis present

## 2021-06-16 DIAGNOSIS — F10229 Alcohol dependence with intoxication, unspecified: Principal | ICD-10-CM | POA: Diagnosis present

## 2021-06-16 DIAGNOSIS — F1020 Alcohol dependence, uncomplicated: Secondary | ICD-10-CM

## 2021-06-16 DIAGNOSIS — Z6822 Body mass index (BMI) 22.0-22.9, adult: Secondary | ICD-10-CM

## 2021-06-16 DIAGNOSIS — F329 Major depressive disorder, single episode, unspecified: Secondary | ICD-10-CM | POA: Diagnosis present

## 2021-06-16 DIAGNOSIS — F17210 Nicotine dependence, cigarettes, uncomplicated: Secondary | ICD-10-CM | POA: Diagnosis present

## 2021-06-16 DIAGNOSIS — F32A Depression, unspecified: Secondary | ICD-10-CM | POA: Diagnosis present

## 2021-06-16 DIAGNOSIS — I10 Essential (primary) hypertension: Secondary | ICD-10-CM | POA: Diagnosis present

## 2021-06-16 DIAGNOSIS — R000 Tachycardia, unspecified: Secondary | ICD-10-CM

## 2021-06-16 DIAGNOSIS — I252 Old myocardial infarction: Secondary | ICD-10-CM

## 2021-06-16 DIAGNOSIS — Z781 Physical restraint status: Secondary | ICD-10-CM

## 2021-06-16 DIAGNOSIS — F1029 Alcohol dependence with unspecified alcohol-induced disorder: Secondary | ICD-10-CM | POA: Diagnosis present

## 2021-06-16 DIAGNOSIS — E860 Dehydration: Secondary | ICD-10-CM | POA: Diagnosis present

## 2021-06-16 DIAGNOSIS — D696 Thrombocytopenia, unspecified: Secondary | ICD-10-CM

## 2021-06-16 DIAGNOSIS — R42 Dizziness and giddiness: Secondary | ICD-10-CM

## 2021-06-16 DIAGNOSIS — D539 Nutritional anemia, unspecified: Secondary | ICD-10-CM | POA: Diagnosis present

## 2021-06-16 DIAGNOSIS — L89322 Pressure ulcer of left buttock, stage 2: Secondary | ICD-10-CM | POA: Diagnosis present

## 2021-06-16 DIAGNOSIS — J449 Chronic obstructive pulmonary disease, unspecified: Secondary | ICD-10-CM | POA: Diagnosis present

## 2021-06-16 DIAGNOSIS — Z5902 Unsheltered homelessness: Secondary | ICD-10-CM

## 2021-06-16 DIAGNOSIS — R270 Ataxia, unspecified: Secondary | ICD-10-CM | POA: Diagnosis present

## 2021-06-16 DIAGNOSIS — W07XXXA Fall from chair, initial encounter: Secondary | ICD-10-CM | POA: Diagnosis present

## 2021-06-16 DIAGNOSIS — Z79899 Other long term (current) drug therapy: Secondary | ICD-10-CM

## 2021-06-16 DIAGNOSIS — I2510 Atherosclerotic heart disease of native coronary artery without angina pectoris: Secondary | ICD-10-CM | POA: Diagnosis present

## 2021-06-16 DIAGNOSIS — Z955 Presence of coronary angioplasty implant and graft: Secondary | ICD-10-CM

## 2021-06-16 DIAGNOSIS — F10239 Alcohol dependence with withdrawal, unspecified: Secondary | ICD-10-CM | POA: Diagnosis not present

## 2021-06-16 DIAGNOSIS — J45909 Unspecified asthma, uncomplicated: Secondary | ICD-10-CM | POA: Diagnosis present

## 2021-06-16 DIAGNOSIS — G8929 Other chronic pain: Secondary | ICD-10-CM | POA: Diagnosis present

## 2021-06-16 DIAGNOSIS — R0600 Dyspnea, unspecified: Secondary | ICD-10-CM

## 2021-06-16 DIAGNOSIS — S0003XA Contusion of scalp, initial encounter: Secondary | ICD-10-CM | POA: Diagnosis present

## 2021-06-16 DIAGNOSIS — Z20822 Contact with and (suspected) exposure to covid-19: Secondary | ICD-10-CM | POA: Diagnosis present

## 2021-06-16 DIAGNOSIS — Z833 Family history of diabetes mellitus: Secondary | ICD-10-CM

## 2021-06-16 DIAGNOSIS — E118 Type 2 diabetes mellitus with unspecified complications: Secondary | ICD-10-CM | POA: Diagnosis present

## 2021-06-16 DIAGNOSIS — E669 Obesity, unspecified: Secondary | ICD-10-CM | POA: Diagnosis present

## 2021-06-16 DIAGNOSIS — Z720 Tobacco use: Secondary | ICD-10-CM | POA: Diagnosis present

## 2021-06-16 DIAGNOSIS — Y907 Blood alcohol level of 200-239 mg/100 ml: Secondary | ICD-10-CM | POA: Diagnosis present

## 2021-06-16 DIAGNOSIS — R197 Diarrhea, unspecified: Secondary | ICD-10-CM | POA: Diagnosis present

## 2021-06-16 DIAGNOSIS — F1024 Alcohol dependence with alcohol-induced mood disorder: Secondary | ICD-10-CM | POA: Diagnosis present

## 2021-06-16 DIAGNOSIS — E871 Hypo-osmolality and hyponatremia: Secondary | ICD-10-CM | POA: Diagnosis present

## 2021-06-16 DIAGNOSIS — E119 Type 2 diabetes mellitus without complications: Secondary | ICD-10-CM | POA: Diagnosis present

## 2021-06-16 DIAGNOSIS — Z8616 Personal history of COVID-19: Secondary | ICD-10-CM

## 2021-06-16 DIAGNOSIS — E512 Wernicke's encephalopathy: Secondary | ICD-10-CM | POA: Diagnosis present

## 2021-06-16 LAB — CBC WITH DIFFERENTIAL/PLATELET
Abs Immature Granulocytes: 0.03 10*3/uL (ref 0.00–0.07)
Basophils Absolute: 0.1 10*3/uL (ref 0.0–0.1)
Basophils Relative: 1 %
Eosinophils Absolute: 0.3 10*3/uL (ref 0.0–0.5)
Eosinophils Relative: 4 %
HCT: 34 % — ABNORMAL LOW (ref 39.0–52.0)
Hemoglobin: 11.9 g/dL — ABNORMAL LOW (ref 13.0–17.0)
Immature Granulocytes: 0 %
Lymphocytes Relative: 23 %
Lymphs Abs: 1.8 10*3/uL (ref 0.7–4.0)
MCH: 34.1 pg — ABNORMAL HIGH (ref 26.0–34.0)
MCHC: 35 g/dL (ref 30.0–36.0)
MCV: 97.4 fL (ref 80.0–100.0)
Monocytes Absolute: 0.9 10*3/uL (ref 0.1–1.0)
Monocytes Relative: 12 %
Neutro Abs: 4.8 10*3/uL (ref 1.7–7.7)
Neutrophils Relative %: 60 %
Platelets: 76 10*3/uL — ABNORMAL LOW (ref 150–400)
RBC: 3.49 MIL/uL — ABNORMAL LOW (ref 4.22–5.81)
RDW: 18.4 % — ABNORMAL HIGH (ref 11.5–15.5)
WBC: 7.9 10*3/uL (ref 4.0–10.5)
nRBC: 0 % (ref 0.0–0.2)

## 2021-06-16 LAB — COMPREHENSIVE METABOLIC PANEL
ALT: 26 U/L (ref 0–44)
AST: 84 U/L — ABNORMAL HIGH (ref 15–41)
Albumin: 3 g/dL — ABNORMAL LOW (ref 3.5–5.0)
Alkaline Phosphatase: 154 U/L — ABNORMAL HIGH (ref 38–126)
Anion gap: 9 (ref 5–15)
BUN: <5 mg/dL — ABNORMAL LOW (ref 6–20)
CO2: 23 mmol/L (ref 22–32)
Calcium: 7.9 mg/dL — ABNORMAL LOW (ref 8.9–10.3)
Chloride: 94 mmol/L — ABNORMAL LOW (ref 98–111)
Creatinine, Ser: 0.48 mg/dL — ABNORMAL LOW (ref 0.61–1.24)
GFR, Estimated: >60 mL/min (ref >60)
Glucose, Bld: 88 mg/dL (ref 70–99)
Potassium: 3.9 mmol/L (ref 3.5–5.1)
Sodium: 126 mmol/L — ABNORMAL LOW (ref 135–145)
Total Bilirubin: 2 mg/dL — ABNORMAL HIGH (ref 0.3–1.2)
Total Protein: 6.9 g/dL (ref 6.5–8.1)

## 2021-06-16 LAB — ETHANOL: Alcohol, Ethyl (B): 216 mg/dL — ABNORMAL HIGH (ref <10)

## 2021-06-16 LAB — MAGNESIUM: Magnesium: 1.9 mg/dL (ref 1.7–2.4)

## 2021-06-16 MED ORDER — LACTATED RINGERS IV BOLUS
1000.0000 mL | Freq: Once | INTRAVENOUS | Status: AC
  Administered 2021-06-16: 1000 mL via INTRAVENOUS

## 2021-06-16 NOTE — ED Notes (Signed)
Pt to CT via stretcher
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
## 2021-06-16 NOTE — H&P (Signed)
History and Physical   Warren Salas EYC:[id number] DOB: [birth year] DOA: 06/16/2021  Referring MD/NP/PA: Dr. Rubin Payor  PCP: Patient, No Pcp Per (Inactive)   Outpatient Specialists: None  Patient coming from: Home  Chief Complaint: Dizziness and ataxia  HPI: Warren Salas is a 60 y.o. male with medical history significant of alcohol abuse, COPD, diabetes, hypertension, previous ST elevation MI, chronic back pain, multiple surgeries and interventions who presented to the ER with dizziness.  Patient has ataxia.  Poor coordination.  His alcohol level is in the 200s which is apparently not markedly elevated for him.  Patient suspected to be having this as a result of alcohol intoxication but also over some for possible CVA.  A code stroke was not called.  He will be admitted for further evaluation and treatment..  ED Course: Temperature 99.7 blood pressure 100/60 pulse 92 respirate of 18 oxygen sat 94% on room air.  White count 7.8 hemoglobin 11.9 and platelets 76.  Sodium 126 potassium 3.9 chloride 94 CO2 23 BUN less than 5 creatinine 1.49 calcium 7.9.  Alcohol level is 216.  Head CT without contrast negative chest x-ray also showed no acute illness.  Respiratory panel currently pending.  Patient is being admitted for evaluation of possible CVA versus alcohol intoxication.  Review of Systems: As per HPI otherwise 10 point review of systems negative.    Past Medical History:  Diagnosis Date   Back pain    COPD (chronic obstructive pulmonary disease) (HCC)    Diabetes mellitus without complication (HCC)    Hypertension    STEMI (ST elevation myocardial infarction) (HCC) 10/2017    Past Surgical History:  Procedure Laterality Date   APPENDECTOMY     CORONARY STENT INTERVENTION N/A 12/29/2017   Procedure: CORONARY STENT INTERVENTION - LAD;  Surgeon: Corky Crafts, MD;  Location: [facility] INVASIVE CV LAB;  Service: Cardiovascular;  Laterality: N/A;   CORONARY/GRAFT ACUTE MI  REVASCULARIZATION N/A 11/01/2017   Procedure: Coronary/Graft Acute MI Revascularization;  Surgeon: Corky Crafts, MD;  Location: [facility] INVASIVE CV LAB;  Service: Cardiovascular;  Laterality: N/A;   LEFT HEART CATH AND CORONARY ANGIOGRAPHY N/A 11/01/2017   Procedure: LEFT HEART CATH AND CORONARY ANGIOGRAPHY;  Surgeon: Corky Crafts, MD;  Location: [facility] INVASIVE CV LAB;  Service: Cardiovascular;  Laterality: N/A;     reports that he has been smoking cigarettes. He has been smoking an average of 1.00 packs per day. He has never used smokeless tobacco. He reports current alcohol use of about 12.0 standard drinks of alcohol per week. He reports current drug use. Drug: Cocaine.  No Known Allergies  Family History  Problem Relation Age of Onset   Hypertension Mother    Diabetes Mother    Hypertension Father    Diabetes Father      Prior to Admission medications   Medication Sig Start Date End Date Taking? Authorizing Provider  amoxicillin-clavulanate (AUGMENTIN) 875-125 MG tablet Take 1 tablet by mouth every 12 (twelve) hours. 06/04/21   Tilden Fossa, MD  lidocaine (XYLOCAINE) 2 % jelly Apply 1 application topically 3 (three) times daily as needed. 06/04/21   Tilden Fossa, MD  nitroGLYCERIN (NITROSTAT) 0.4 MG SL tablet Place 1 tablet (0.4 mg total) under the tongue every 5 (five) minutes as needed. 11/22/18   Micheal Likens, MD  nystatin ointment (MYCOSTATIN) Apply topically 3 (three) times daily. 06/04/21   Tilden Fossa, MD    Physical Exam: Vitals:   06/16/21 2145 06/16/21 2200 06/16/21 2215 06/16/21 2300  BP: 108/78 100/60 121/87 105/64  Pulse: 91 86 85 85  Resp:  15 15 14   Temp:      TempSrc:      SpO2: 94% 94%  94%  Weight:      Height:          Constitutional: Obese, acutely ill looking, tremulous Vitals:   06/16/21 2145 06/16/21 2200 06/16/21 2215 06/16/21 2300  BP: 108/78 100/60 121/87 105/64  Pulse: 91 86 85 85  Resp:  15 15 14   Temp:       TempSrc:      SpO2: 94% 94%  94%  Weight:      Height:       Eyes: PERRL, lids and conjunctivae normal ENMT: Mucous membranes are dry. Posterior pharynx clear of any exudate or lesions.Normal dentition.  Neck: normal, supple, no masses, no thyromegaly Respiratory: clear to auscultation bilaterally, no wheezing, no crackles. Normal respiratory effort. No accessory muscle use.  Cardiovascular: Sinus tachycardia, no murmurs / rubs / gallops. No extremity edema. 2+ pedal pulses. No carotid bruits.  Abdomen: no tenderness, no masses palpated. No hepatosplenomegaly. Bowel sounds positive.  Musculoskeletal: no clubbing / cyanosis. No joint deformity upper and lower extremities. Good ROM, no contractures. Normal muscle tone.  Skin: no rashes, lesions, ulcers. No induration Neurologic: Poor coordination, no focal weakness, appears to have diffuse ataxia.  Psychiatric: Normal judgment and insight. Alert and oriented x 3. Normal mood.     Labs on Admission: I have personally reviewed following labs and imaging studies  CBC: Recent Labs  Lab 06/16/21 1908  WBC 7.9  NEUTROABS 4.8  HGB 11.9*  HCT 34.0*  MCV 97.4  PLT 76*   Basic Metabolic Panel: Recent Labs  Lab 06/16/21 1908  NA 126*  K 3.9  CL 94*  CO2 23  GLUCOSE 88  BUN <5*  CREATININE 0.48*  CALCIUM 7.9*  MG 1.9   GFR: Estimated Creatinine Clearance: 88.6 mL/min (A) (by C-G formula based on SCr of 0.48 mg/dL (L)). Liver Function Tests: Recent Labs  Lab 06/16/21 1908  AST 84*  ALT 26  ALKPHOS 154*  BILITOT 2.0*  PROT 6.9  ALBUMIN 3.0*   No results for input(s): LIPASE, AMYLASE in the last 168 hours. No results for input(s): AMMONIA in the last 168 hours. Coagulation Profile: No results for input(s): INR, PROTIME in the last 168 hours. Cardiac Enzymes: No results for input(s): CKTOTAL, CKMB, CKMBINDEX, TROPONINI in the last 168 hours. BNP (last 3 results) No results for input(s): PROBNP in the last 8760  hours. HbA1C: No results for input(s): HGBA1C in the last 72 hours. CBG: No results for input(s): GLUCAP in the last 168 hours. Lipid Profile: No results for input(s): CHOL, HDL, LDLCALC, TRIG, CHOLHDL, LDLDIRECT in the last 72 hours. Thyroid Function Tests: No results for input(s): TSH, T4TOTAL, FREET4, T3FREE, THYROIDAB in the last 72 hours. Anemia Panel: No results for input(s): VITAMINB12, FOLATE, FERRITIN, TIBC, IRON, RETICCTPCT in the last 72 hours. Urine analysis:    Component Value Date/Time   COLORURINE STRAW (A) 02/08/2020 2140   APPEARANCEUR CLEAR 02/08/2020 2140   LABSPEC 1.002 (L) 02/08/2020 2140   PHURINE 6.0 02/08/2020 2140   GLUCOSEU NEGATIVE 02/08/2020 2140   HGBUR NEGATIVE 02/08/2020 2140   BILIRUBINUR NEGATIVE 02/08/2020 2140   KETONESUR NEGATIVE 02/08/2020 2140   PROTEINUR NEGATIVE 02/08/2020 2140   NITRITE NEGATIVE 02/08/2020 2140   LEUKOCYTESUR NEGATIVE 02/08/2020 2140   Sepsis Labs: @LABRCNTIP (procalcitonin:4,lacticidven:4) )No results found for this or any  previous visit (from the past 240 hour(s)).   Radiological Exams on Admission: CT Head Wo Contrast  Result Date: 06/16/2021 CLINICAL DATA:  Head trauma. Status post fall. Hit back of head on concrete. EXAM: CT HEAD WITHOUT CONTRAST TECHNIQUE: Contiguous axial images were obtained from the base of the skull through the vertex without intravenous contrast. COMPARISON:  03/24/2021 FINDINGS: Brain: No evidence of acute infarction, hemorrhage, hydrocephalus, extra-axial collection or mass lesion/mass effect. Prominence of the sulci and ventricles identified compatible with brain atrophy. There is mild diffuse low-attenuation within the subcortical and periventricular white matter compatible with chronic microvascular disease. Vascular: No hyperdense vessel or unexpected calcification. Skull: Normal. Negative for fracture or focal lesion. Sinuses/Orbits: There is moderate mucosal thickening involving the  visualized portions of the maxillary sinuses and ethmoid air cells. Mastoid air cells are clear. Other: None IMPRESSION: 1. No acute intracranial abnormalities. 2. Chronic small vessel ischemic disease and brain atrophy. 3. Sinus inflammation. Electronically Signed   By: Signa Kell M.D.   On: 06/16/2021 20:35   DG Chest Portable 1 View  Result Date: 06/16/2021 CLINICAL DATA:  Cough EXAM: PORTABLE CHEST 1 VIEW COMPARISON:  02/08/2020 FINDINGS: Borderline cardiomegaly. Both lungs are clear. The visualized skeletal structures are unremarkable. IMPRESSION: No active disease. Electronically Signed   By: Jasmine Pang M.D.   On: 06/16/2021 19:55    EKG: Independently reviewed.  Sinus tachycardia otherwise no significant findings.  Assessment/Plan Principal Problem:   Dizziness Active Problems:   CAD (coronary artery disease)   Tobacco abuse   Type 2 diabetes mellitus with complication, without long-term current use of insulin (HCC)   Alcohol dependence with alcohol-induced mood disorder (HCC)   Asthma   Depressive disorder   Essential hypertension   Hyponatremia     #1 dizziness and ataxia: Most likely related to alcohol intoxication.  Patient is still being evaluated for possible CVA.  We will get MRI of the brain done.  Hydrate the patient and continue with work-up and PT OT.  Treat alcohol abuse.  #2 alcohol intoxication: Initiating CIWA protocol.  Give thiamine and folic acid  #3 tobacco abuse: Nicotine patch will be added.  #4 diabetes: Initiate sliding scale insulin   #5 coronary artery disease: Stable at baseline.  #6 hyponatremia: Most likely secondary to alcoholism.  Continue to monitor  #7 essential hypertension: Not on any chronic medication.  We will not start any.  #8 asthma: No exacerbation  DVT prophylaxis: Lovenox Code Status: Full code Family Communication: No family at bedside Disposition Plan: Home Consults called: None Admission status:  Observation  Severity of Illness: The appropriate patient status for this patient is OBSERVATION. Observation status is judged to be reasonable and necessary in order to provide the required intensity of service to ensure the patient's safety. The patient's presenting symptoms, physical exam findings, and initial radiographic and laboratory data in the context of their medical condition is felt to place them at decreased risk for further clinical deterioration. Furthermore, it is anticipated that the patient will be medically stable for discharge from the hospital within 2 midnights of admission. The following factors support the patient status of observation.   " The patient's presenting symptoms include dizziness and weakness. " The physical exam findings include tremulous and ataxia. " The initial radiographic and laboratory data are alcohol level of 260.   Lonia Blood MD [facility] Pager [phone]  If 7PM-7AM, please contact night-coverage [email or web address] Password [facility]  06/16/2021, 11:40 PM

## 2021-06-17 ENCOUNTER — Observation Stay (HOSPITAL_COMMUNITY): Payer: Medicare Other

## 2021-06-17 DIAGNOSIS — Y907 Blood alcohol level of 200-239 mg/100 ml: Secondary | ICD-10-CM | POA: Diagnosis present

## 2021-06-17 DIAGNOSIS — F1024 Alcohol dependence with alcohol-induced mood disorder: Secondary | ICD-10-CM

## 2021-06-17 DIAGNOSIS — Z20822 Contact with and (suspected) exposure to covid-19: Secondary | ICD-10-CM | POA: Diagnosis not present

## 2021-06-17 DIAGNOSIS — E512 Wernicke's encephalopathy: Secondary | ICD-10-CM | POA: Diagnosis not present

## 2021-06-17 DIAGNOSIS — D539 Nutritional anemia, unspecified: Secondary | ICD-10-CM | POA: Diagnosis present

## 2021-06-17 DIAGNOSIS — R270 Ataxia, unspecified: Secondary | ICD-10-CM | POA: Diagnosis present

## 2021-06-17 DIAGNOSIS — I10 Essential (primary) hypertension: Secondary | ICD-10-CM | POA: Diagnosis not present

## 2021-06-17 DIAGNOSIS — E119 Type 2 diabetes mellitus without complications: Secondary | ICD-10-CM | POA: Diagnosis present

## 2021-06-17 DIAGNOSIS — F1020 Alcohol dependence, uncomplicated: Secondary | ICD-10-CM | POA: Diagnosis not present

## 2021-06-17 DIAGNOSIS — S0003XA Contusion of scalp, initial encounter: Secondary | ICD-10-CM | POA: Diagnosis present

## 2021-06-17 DIAGNOSIS — F17210 Nicotine dependence, cigarettes, uncomplicated: Secondary | ICD-10-CM | POA: Diagnosis present

## 2021-06-17 DIAGNOSIS — R42 Dizziness and giddiness: Secondary | ICD-10-CM | POA: Diagnosis present

## 2021-06-17 DIAGNOSIS — F10239 Alcohol dependence with withdrawal, unspecified: Secondary | ICD-10-CM | POA: Diagnosis not present

## 2021-06-17 DIAGNOSIS — G609 Hereditary and idiopathic neuropathy, unspecified: Secondary | ICD-10-CM | POA: Diagnosis present

## 2021-06-17 DIAGNOSIS — F32A Depression, unspecified: Secondary | ICD-10-CM | POA: Diagnosis present

## 2021-06-17 DIAGNOSIS — L89322 Pressure ulcer of left buttock, stage 2: Secondary | ICD-10-CM | POA: Diagnosis not present

## 2021-06-17 DIAGNOSIS — E860 Dehydration: Secondary | ICD-10-CM | POA: Diagnosis present

## 2021-06-17 DIAGNOSIS — I2510 Atherosclerotic heart disease of native coronary artery without angina pectoris: Secondary | ICD-10-CM | POA: Diagnosis not present

## 2021-06-17 DIAGNOSIS — E871 Hypo-osmolality and hyponatremia: Secondary | ICD-10-CM | POA: Diagnosis present

## 2021-06-17 DIAGNOSIS — F10229 Alcohol dependence with intoxication, unspecified: Secondary | ICD-10-CM | POA: Diagnosis not present

## 2021-06-17 DIAGNOSIS — Z8616 Personal history of COVID-19: Secondary | ICD-10-CM | POA: Diagnosis not present

## 2021-06-17 DIAGNOSIS — J449 Chronic obstructive pulmonary disease, unspecified: Secondary | ICD-10-CM | POA: Diagnosis not present

## 2021-06-17 DIAGNOSIS — D696 Thrombocytopenia, unspecified: Secondary | ICD-10-CM | POA: Diagnosis not present

## 2021-06-17 DIAGNOSIS — W07XXXA Fall from chair, initial encounter: Secondary | ICD-10-CM | POA: Diagnosis present

## 2021-06-17 DIAGNOSIS — G8929 Other chronic pain: Secondary | ICD-10-CM | POA: Diagnosis present

## 2021-06-17 DIAGNOSIS — L8990 Pressure ulcer of unspecified site, unspecified stage: Secondary | ICD-10-CM | POA: Insufficient documentation

## 2021-06-17 DIAGNOSIS — R000 Tachycardia, unspecified: Secondary | ICD-10-CM | POA: Diagnosis present

## 2021-06-17 DIAGNOSIS — F1029 Alcohol dependence with unspecified alcohol-induced disorder: Secondary | ICD-10-CM | POA: Diagnosis not present

## 2021-06-17 DIAGNOSIS — R9431 Abnormal electrocardiogram [ECG] [EKG]: Secondary | ICD-10-CM | POA: Diagnosis not present

## 2021-06-17 DIAGNOSIS — R197 Diarrhea, unspecified: Secondary | ICD-10-CM | POA: Diagnosis present

## 2021-06-17 DIAGNOSIS — E669 Obesity, unspecified: Secondary | ICD-10-CM | POA: Diagnosis not present

## 2021-06-17 LAB — RESP PANEL BY RT-PCR (FLU A&B, COVID) ARPGX2
Influenza A by PCR: NEGATIVE
Influenza B by PCR: NEGATIVE
SARS Coronavirus 2 by RT PCR: NEGATIVE

## 2021-06-17 LAB — COMPREHENSIVE METABOLIC PANEL
ALT: 25 U/L (ref 0–44)
AST: 86 U/L — ABNORMAL HIGH (ref 15–41)
Albumin: 3 g/dL — ABNORMAL LOW (ref 3.5–5.0)
Alkaline Phosphatase: 138 U/L — ABNORMAL HIGH (ref 38–126)
Anion gap: 9 (ref 5–15)
BUN: <5 mg/dL — ABNORMAL LOW (ref 6–20)
CO2: 23 mmol/L (ref 22–32)
Calcium: 8 mg/dL — ABNORMAL LOW (ref 8.9–10.3)
Chloride: 100 mmol/L (ref 98–111)
Creatinine, Ser: 0.48 mg/dL — ABNORMAL LOW (ref 0.61–1.24)
GFR, Estimated: >60 mL/min (ref >60)
Glucose, Bld: 81 mg/dL (ref 70–99)
Potassium: 3.8 mmol/L (ref 3.5–5.1)
Sodium: 132 mmol/L — ABNORMAL LOW (ref 135–145)
Total Bilirubin: 2.6 mg/dL — ABNORMAL HIGH (ref 0.3–1.2)
Total Protein: 6.8 g/dL (ref 6.5–8.1)

## 2021-06-17 LAB — GLUCOSE, CAPILLARY
Glucose-Capillary: 85 mg/dL (ref 70–99)
Glucose-Capillary: 92 mg/dL (ref 70–99)
Glucose-Capillary: 83 mg/dL (ref 70–99)
Glucose-Capillary: 111 mg/dL — ABNORMAL HIGH (ref 70–99)

## 2021-06-17 LAB — PHOSPHORUS: Phosphorus: 3.4 mg/dL (ref 2.5–4.6)

## 2021-06-17 LAB — CBC
HCT: 34.4 % — ABNORMAL LOW (ref 39.0–52.0)
Hemoglobin: 11.8 g/dL — ABNORMAL LOW (ref 13.0–17.0)
MCH: 33.8 pg (ref 26.0–34.0)
MCHC: 34.3 g/dL (ref 30.0–36.0)
MCV: 98.6 fL (ref 80.0–100.0)
Platelets: 68 10*3/uL — ABNORMAL LOW (ref 150–400)
RBC: 3.49 MIL/uL — ABNORMAL LOW (ref 4.22–5.81)
RDW: 18.6 % — ABNORMAL HIGH (ref 11.5–15.5)
WBC: 6.7 10*3/uL (ref 4.0–10.5)
nRBC: 0 % (ref 0.0–0.2)

## 2021-06-17 LAB — HEMOGLOBIN A1C
Hgb A1c MFr Bld: 5.4 % (ref 4.8–5.6)
Mean Plasma Glucose: 108.28 mg/dL

## 2021-06-17 LAB — MAGNESIUM: Magnesium: 1.9 mg/dL (ref 1.7–2.4)

## 2021-06-17 LAB — HIV ANTIBODY (ROUTINE TESTING W REFLEX): HIV Screen 4th Generation wRfx: NONREACTIVE

## 2021-06-17 LAB — C DIFFICILE QUICK SCREEN W PCR REFLEX
C Diff antigen: NEGATIVE
C Diff interpretation: NOT DETECTED
C Diff toxin: NEGATIVE

## 2021-06-17 MED ORDER — THIAMINE HCL 100 MG/ML IJ SOLN: 100.0000 mg | Freq: Every day | INTRAMUSCULAR | Status: DC

## 2021-06-17 MED ORDER — THIAMINE HCL 100 MG/ML IJ SOLN
Freq: Once | INTRAVENOUS | Status: AC
  Filled 2021-06-17: qty 1000

## 2021-06-17 MED ORDER — GERHARDT'S BUTT CREAM
TOPICAL_CREAM | Freq: Four times a day (QID) | CUTANEOUS | Status: DC
  Administered 2021-06-18 – 2021-06-22 (×4): 1 via TOPICAL
  Filled 2021-06-17 (×2): qty 1

## 2021-06-17 MED ORDER — LORAZEPAM 1 MG PO TABS
1.0000 mg | ORAL_TABLET | ORAL | Status: AC | PRN
Start: 2021-06-17 — End: 2021-06-20

## 2021-06-17 MED ORDER — ENOXAPARIN SODIUM 40 MG/0.4ML IJ SOSY
40.0000 mg | PREFILLED_SYRINGE | INTRAMUSCULAR | Status: DC
  Administered 2021-06-17 – 2021-06-25 (×9): 40 mg via SUBCUTANEOUS
  Filled 2021-06-17 (×9): qty 0.4

## 2021-06-17 MED ORDER — GUAIFENESIN-DM 100-10 MG/5ML PO SYRP
5.0000 mL | ORAL_SOLUTION | ORAL | Status: DC | PRN
  Administered 2021-06-18 – 2021-06-19 (×3): 5 mL via ORAL
  Filled 2021-06-17 (×5): qty 5

## 2021-06-17 MED ORDER — INSULIN ASPART 100 UNIT/ML IJ SOLN
0.0000 [IU] | Freq: Three times a day (TID) | INTRAMUSCULAR | Status: DC
  Administered 2021-06-18 – 2021-06-25 (×3): 2 [IU] via SUBCUTANEOUS

## 2021-06-17 MED ORDER — INSULIN ASPART 100 UNIT/ML IJ SOLN
0.0000 [IU] | Freq: Every day | INTRAMUSCULAR | Status: DC
Start: 2021-06-17 — End: 2021-06-26

## 2021-06-17 MED ORDER — FOLIC ACID 1 MG PO TABS
1.0000 mg | ORAL_TABLET | Freq: Every day | ORAL | Status: DC
  Administered 2021-06-17 – 2021-06-25 (×7): 1 mg via ORAL
  Filled 2021-06-17 (×7): qty 1

## 2021-06-17 MED ORDER — LACTATED RINGERS IV SOLN: INTRAVENOUS | Status: DC

## 2021-06-17 MED ORDER — LORAZEPAM 2 MG/ML IJ SOLN
1.0000 mg | INTRAMUSCULAR | Status: AC | PRN
  Administered 2021-06-17: 1 mg via INTRAVENOUS
  Administered 2021-06-19 (×2): 2 mg via INTRAVENOUS
  Administered 2021-06-19: 3 mg via INTRAVENOUS
  Administered 2021-06-19 (×3): 2 mg via INTRAVENOUS
  Administered 2021-06-19: 3 mg via INTRAVENOUS
  Administered 2021-06-19: 2 mg via INTRAVENOUS
  Filled 2021-06-17 (×2): qty 2
  Filled 2021-06-17 (×3): qty 1
  Filled 2021-06-17: qty 2
  Filled 2021-06-17 (×3): qty 1

## 2021-06-17 MED ORDER — ONDANSETRON HCL 4 MG/2ML IJ SOLN
4.0000 mg | Freq: Four times a day (QID) | INTRAMUSCULAR | Status: DC | PRN
  Administered 2021-06-19: 4 mg via INTRAVENOUS
  Filled 2021-06-17: qty 2

## 2021-06-17 MED ORDER — ONDANSETRON HCL 4 MG PO TABS: 4.0000 mg | ORAL_TABLET | Freq: Four times a day (QID) | ORAL | Status: DC | PRN

## 2021-06-17 MED ORDER — ADULT MULTIVITAMIN W/MINERALS CH
1.0000 | ORAL_TABLET | Freq: Every day | ORAL | Status: DC
  Administered 2021-06-17 – 2021-06-25 (×7): 1 via ORAL
  Filled 2021-06-17 (×7): qty 1

## 2021-06-17 MED ORDER — THIAMINE HCL 100 MG PO TABS
100.0000 mg | ORAL_TABLET | Freq: Every day | ORAL | Status: DC
  Administered 2021-06-17 – 2021-06-23 (×5): 100 mg via ORAL
  Filled 2021-06-17 (×6): qty 1

## 2021-06-17 MED ORDER — NICOTINE 21 MG/24HR TD PT24
21.0000 mg | MEDICATED_PATCH | Freq: Every day | TRANSDERMAL | Status: DC
  Administered 2021-06-17 – 2021-06-25 (×9): 21 mg via TRANSDERMAL
  Filled 2021-06-17 (×9): qty 1
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
## 2021-06-17 NOTE — Consult Note (Signed)
WOC Nurse Consult Note: Reason for Consult: MASD, MASD plus fungal, partial thickness skin loss (Stage 2), R>L Wound type: pressure plus moisture plus infection Pressure Injury POA: Yes Measurement: Right buttock:  5cm x 2cm x 0.1cm Left Buttock: 2.6cm x 1.6cm x 0.1cm Wound bed:Red, moist Drainage (amount, consistency, odor) scant serous Periwound: with erythema, scaling, satellite lesions in the perineal, scrotal, medial thigh and buttock areas. Dressing procedure/placement/frequency: Patient with history of several soft, seedy, stools both in recent past and here in house. Lying on his side due to discomfort. I will provide guidance for the topical care of the integument using a soap and water cleanse, tap water rinse and gently pat dry followed by application of Gerhart's Butt Cream (1:1:1 zinc oxide, hydrocortisone, lotrimin) to the affected areas in a thin layer, 4 times daily.  I have communicated with Dr. Elvera Lennox via Secure Chat and recommended that the topical care be supported by a few doses of an oral (systemic) antifungal e.g., Diflucan.   WOC nursing team will not follow, but will remain available to this patient, the nursing and medical teams.  Please re-consult if needed. Thanks, Ladona Mow, MSN, RN, GNP, [place]  Pager# [phone]

## 2021-06-17 NOTE — Plan of Care (Signed)

## 2021-06-17 NOTE — Progress Notes (Signed)
PROGRESS NOTE  Warren Salas OZH:[id number] DOB: 09/28/1961 DOA: 06/16/2021 PCP: Patient, No Pcp Per (Inactive)   LOS: 0 days   Brief Narrative / Interim history: 60 year old male with alcohol abuse, homelessness currently living in a tent, COPD, DM 2, CAD, chronic back pain comes to the hospital with profuse diarrhea as well as weakness/dizziness.  He tells me his diarrhea is somewhat chronic going back a few months but in the last few days it has gotten significantly worse with more than 10 watery bowel movements per day.  He is also been extremely weak and dizzy upon standing and walking.  Subjective / 24h Interval events: Continues to have watery stools, complains of ongoing dizziness  Assessment & Plan: Principal Problem Dizziness-possibly related to EtOH intoxication as his levels were positive on admission.  MRI of the brain was negative for acute CVA.  He is also extremely weak possibly due to ongoing diarrhea.  Continue IV fluids, encourage p.o. intake.  PT to see  Active Problems Hyponatremia-due to dehydration, sodium improving with fluids.  Continue LR  Diarrhea-appears to be acute on chronic, will check a C. difficile/GI pathogen panel.  Continue IV fluids, diarrhea is still persistent  EtOH abuse-continue CIWA  Type 2 diabetes mellitus-continue sliding scale  Tobacco use-nicotine patch  CAD-no chest pain, appears stable  History of asthma-no wheezing, this is stable  Homelessness-lives in a tent, case manager consulted.  He does not seem interested in shelters  Scheduled Meds:  enoxaparin (LOVENOX) injection  40 mg Subcutaneous Q24H   folic acid  1 mg Oral Daily   Gerhardt's butt cream   Topical QID   insulin aspart  0-15 Units Subcutaneous TID WC   insulin aspart  0-5 Units Subcutaneous QHS   multivitamin with minerals  1 tablet Oral Daily   nicotine  21 mg Transdermal Daily   thiamine  100 mg Oral Daily   Or   thiamine  100 mg Intravenous Daily    Continuous Infusions:  lactated ringers 100 mL/hr at 06/17/21 0459   PRN Meds:.guaiFENesin-dextromethorphan, LORazepam **OR** LORazepam, ondansetron **OR** ondansetron (ZOFRAN) IV  Diet Orders (From admission, onward)     Start     Ordered   06/17/21 0050  Diet heart healthy/carb modified Room service appropriate? Yes; Fluid consistency: Thin  Diet effective now       Question Answer Comment  Diet-HS Snack? Nothing   Room service appropriate? Yes   Fluid consistency: Thin      06/17/21 0049            DVT prophylaxis: enoxaparin (LOVENOX) injection 40 mg Start: 06/17/21 1000     Code Status: Full Code  Family Communication: No family at bedside  Status is: Observation  The patient will require care spanning > 2 midnights and should be moved to inpatient because: IV treatments appropriate due to intensity of illness or inability to take PO  Dispo: The patient is from: Home              Anticipated d/c is to: Home              Patient currently is not medically stable to d/c.   Difficult to place patient No   Level of care: Telemetry  Consultants:  None  Procedures:  none  Microbiology  none  Antimicrobials: none    Objective: Vitals:   06/16/21 2300 06/17/21 0048 06/17/21 0515 06/17/21 0918  BP: 105/64 136/90 (!) 144/78 134/74  Pulse: 85 85 99 99  Resp: 14 18 20  (!) 22  Temp:  98.1 F (36.7 C) 99 F (37.2 C) 98 F (36.7 C)  TempSrc:  Oral Oral   SpO2: 94% 96% 96% 98%  Weight:      Height:        Intake/Output Summary (Last 24 hours) at 06/17/2021 1134 Last data filed at 06/17/2021 0506 Gross per 24 hour  Intake --  Output 1 ml  Net -1 ml   Filed Weights   06/16/21 1835  Weight: 64 kg    Examination:  Constitutional: NAD Eyes: no scleral icterus ENMT: Mucous membranes are moist.  Neck: normal, supple Respiratory: clear to auscultation bilaterally, no wheezing, no crackles. Normal respiratory effort. Cardiovascular: Regular rate  and rhythm, no murmurs / rubs / gallops. No LE edema. Abdomen: non distended, no tenderness. Bowel sounds positive.  Musculoskeletal: no clubbing / cyanosis.  Skin: no rashes Neurologic: non focal   Data Reviewed: I have independently reviewed following labs and imaging studies   CBC: Recent Labs  Lab 06/16/21 1908 06/17/21 0509  WBC 7.9 6.7  NEUTROABS 4.8  --   HGB 11.9* 11.8*  HCT 34.0* 34.4*  MCV 97.4 98.6  PLT 76* 68*   Basic Metabolic Panel: Recent Labs  Lab 06/16/21 1908 06/17/21 0509  NA 126* 132*  K 3.9 3.8  CL 94* 100  CO2 23 23  GLUCOSE 88 81  BUN <5* <5*  CREATININE 0.48* 0.48*  CALCIUM 7.9* 8.0*  MG 1.9 1.9  PHOS  --  3.4   Liver Function Tests: Recent Labs  Lab 06/16/21 1908 06/17/21 0509  AST 84* 86*  ALT 26 25  ALKPHOS 154* 138*  BILITOT 2.0* 2.6*  PROT 6.9 6.8  ALBUMIN 3.0* 3.0*   Coagulation Profile: No results for input(s): INR, PROTIME in the last 168 hours. HbA1C: Recent Labs    06/17/21 0509  HGBA1C 5.4   CBG: Recent Labs  Lab 06/17/21 0136 06/17/21 0727  GLUCAP 85 92    Recent Results (from the past 240 hour(s))  Resp Panel by RT-PCR (Flu A&B, Covid) Nasopharyngeal Swab     Status: None   Collection Time: 06/16/21 11:25 PM   Specimen: Nasopharyngeal Swab; Nasopharyngeal(NP) swabs in vial transport medium  Result Value Ref Range Status   SARS Coronavirus 2 by RT PCR NEGATIVE NEGATIVE Final    Comment: (NOTE) SARS-CoV-2 target nucleic acids are NOT DETECTED.  The SARS-CoV-2 RNA is generally detectable in upper respiratory specimens during the acute phase of infection. The lowest concentration of SARS-CoV-2 viral copies this assay can detect is 138 copies/mL. A negative result does not preclude SARS-Cov-2 infection and should not be used as the sole basis for treatment or other patient management decisions. A negative result may occur with  improper specimen collection/handling, submission of specimen other than  nasopharyngeal swab, presence of viral mutation(s) within the areas targeted by this assay, and inadequate number of viral copies(<138 copies/mL). A negative result must be combined with clinical observations, patient history, and epidemiological information. The expected result is Negative.  Fact Sheet for Patients:  06/18/21  Fact Sheet for Healthcare Providers:  BloggerCourse.com  This test is no t yet approved or cleared by the [place] FDA and  has been authorized for detection and/or diagnosis of SARS-CoV-2 by FDA under an Emergency Use Authorization (EUA). This EUA will remain  in effect (meaning this test can be used) for the duration of the COVID-19 declaration under Section 564(b)(1) of the Act, 21 U.S.C.section  360bbb-3(b)(1), unless the authorization is terminated  or revoked sooner.       Influenza A by PCR NEGATIVE NEGATIVE Final   Influenza B by PCR NEGATIVE NEGATIVE Final    Comment: (NOTE) The Xpert Xpress SARS-CoV-2/FLU/RSV plus assay is intended as an aid in the diagnosis of influenza from Nasopharyngeal swab specimens and should not be used as a sole basis for treatment. Nasal washings and aspirates are unacceptable for Xpert Xpress SARS-CoV-2/FLU/RSV testing.  Fact Sheet for Patients: BloggerCourse.com  Fact Sheet for Healthcare Providers: SeriousBroker.it  This test is not yet approved or cleared by the [place] FDA and has been authorized for detection and/or diagnosis of SARS-CoV-2 by FDA under an Emergency Use Authorization (EUA). This EUA will remain in effect (meaning this test can be used) for the duration of the COVID-19 declaration under Section 564(b)(1) of the Act, 21 U.S.C. section 360bbb-3(b)(1), unless the authorization is terminated or revoked.  Performed at [facility], [address], [place], [place] [address]   C Difficile Quick Screen w PCR reflex     Status: None   Collection Time: 06/17/21  9:20 AM   Specimen: STOOL  Result Value Ref Range Status   C Diff antigen NEGATIVE NEGATIVE Final   C Diff toxin NEGATIVE NEGATIVE Final   C Diff interpretation No C. difficile detected.  Final    Comment: Performed at [facility], [address], [place], [place] [address]     Radiology Studies: CT Head Wo Contrast  Result Date: 06/16/2021 CLINICAL DATA:  Head trauma. Status post fall. Hit back of head on concrete. EXAM: CT HEAD WITHOUT CONTRAST TECHNIQUE: Contiguous axial images were obtained from the base of the skull through the vertex without intravenous contrast. COMPARISON:  03/24/2021 FINDINGS: Brain: No evidence of acute infarction, hemorrhage, hydrocephalus, extra-axial collection or mass lesion/mass effect. Prominence of the sulci and ventricles identified compatible with brain atrophy. There is mild diffuse low-attenuation within the subcortical and periventricular white matter compatible with chronic microvascular disease. Vascular: No hyperdense vessel or unexpected calcification. Skull: Normal. Negative for fracture or focal lesion. Sinuses/Orbits: There is moderate mucosal thickening involving the visualized portions of the maxillary sinuses and ethmoid air cells. Mastoid air cells are clear. Other: None IMPRESSION: 1. No acute intracranial abnormalities. 2. Chronic small vessel ischemic disease and brain atrophy. 3. Sinus inflammation. Electronically Signed   By: Signa Kell M.D.   On: 06/16/2021 20:35   MR BRAIN WO CONTRAST  Result Date: 06/17/2021 CLINICAL DATA:  TIA. EXAM: MRI HEAD WITHOUT CONTRAST TECHNIQUE: Multiplanar, multiecho pulse sequences of the brain and surrounding structures were obtained without intravenous contrast. COMPARISON:  Head CT from earlier today FINDINGS: Brain: No acute infarction, hemorrhage, hydrocephalus,  extra-axial collection or mass lesion. Premature brain atrophy which is generalized. Small remote cortically based infarct at the left frontal parietal junction. Vascular: Normal flow voids Skull and upper cervical spine: Normal marrow signal Sinuses/Orbits: Prominent mucosal thickening in the bilateral maxillary sinuses. Milder ethmoid sinusitis. IMPRESSION: 1. No acute finding including infarct. 2. Brain atrophy.  Small remote left frontal parietal infarcts. 3. Generalized sinusitis. Electronically Signed   By: Marnee Spring M.D.   On: 06/17/2021 06:52   DG Chest Portable 1 View  Result Date: 06/16/2021 CLINICAL DATA:  Cough EXAM: PORTABLE CHEST 1 VIEW COMPARISON:  02/08/2020 FINDINGS: Borderline cardiomegaly. Both lungs are clear. The visualized skeletal structures are unremarkable. IMPRESSION: No active disease. Electronically Signed   By: Adrian Prows.D.  On: 06/16/2021 19:55    Pamella Pert, MD, PhD [facility]  Between 7 am - 7 pm I am available, please contact me via Amion (for emergencies) or Securechat (non urgent messages)  Between 7 pm - 7 am I am not available, please contact night coverage MD/APP via Amion

## 2021-06-17 NOTE — Progress Notes (Signed)
Patient had  multiple BMs since coming to the floor. Stool is mushy, not watery, yellowish in collor. On call provider notified
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
## 2021-06-18 LAB — GASTROINTESTINAL PANEL BY PCR, STOOL (REPLACES STOOL CULTURE)

## 2021-06-18 LAB — BASIC METABOLIC PANEL
Anion gap: 8 (ref 5–15)
BUN: 7 mg/dL (ref 6–20)
CO2: 24 mmol/L (ref 22–32)
Calcium: 8.3 mg/dL — ABNORMAL LOW (ref 8.9–10.3)
Chloride: 98 mmol/L (ref 98–111)
Creatinine, Ser: 0.47 mg/dL — ABNORMAL LOW (ref 0.61–1.24)
GFR, Estimated: >60 mL/min (ref >60)
Glucose, Bld: 84 mg/dL (ref 70–99)
Potassium: 3.5 mmol/L (ref 3.5–5.1)
Sodium: 130 mmol/L — ABNORMAL LOW (ref 135–145)

## 2021-06-18 LAB — GLUCOSE, CAPILLARY
Glucose-Capillary: 87 mg/dL (ref 70–99)
Glucose-Capillary: 127 mg/dL — ABNORMAL HIGH (ref 70–99)
Glucose-Capillary: 103 mg/dL — ABNORMAL HIGH (ref 70–99)
Glucose-Capillary: 142 mg/dL — ABNORMAL HIGH (ref 70–99)

## 2021-06-18 LAB — CBC
HCT: 33.9 % — ABNORMAL LOW (ref 39.0–52.0)
Hemoglobin: 11.5 g/dL — ABNORMAL LOW (ref 13.0–17.0)
MCH: 33.7 pg (ref 26.0–34.0)
MCHC: 33.9 g/dL (ref 30.0–36.0)
MCV: 99.4 fL (ref 80.0–100.0)
Platelets: 55 10*3/uL — ABNORMAL LOW (ref 150–400)
RBC: 3.41 MIL/uL — ABNORMAL LOW (ref 4.22–5.81)
RDW: 18.6 % — ABNORMAL HIGH (ref 11.5–15.5)
WBC: 6.5 10*3/uL (ref 4.0–10.5)
nRBC: 0 % (ref 0.0–0.2)

## 2021-06-18 MED ORDER — ACETAMINOPHEN 325 MG PO TABS
650.0000 mg | ORAL_TABLET | ORAL | Status: DC | PRN
  Administered 2021-06-18 – 2021-06-19 (×2): 650 mg via ORAL
  Filled 2021-06-18 (×2): qty 2

## 2021-06-18 MED ORDER — LOPERAMIDE HCL 2 MG PO CAPS
2.0000 mg | ORAL_CAPSULE | ORAL | Status: DC | PRN
  Administered 2021-06-20: 2 mg via ORAL
  Filled 2021-06-18: qty 1

## 2021-06-18 MED ORDER — ACETAMINOPHEN 325 MG PO TABS
650.0000 mg | ORAL_TABLET | Freq: Once | ORAL | Status: AC
  Administered 2021-06-18: 650 mg via ORAL
  Filled 2021-06-18: qty 2

## 2021-06-18 NOTE — Progress Notes (Signed)
PROGRESS NOTE  Warren Salas NWG:[id number] DOB: January 10, 1961 DOA: 06/16/2021 PCP: Patient, No Pcp Per (Inactive)   LOS: 1 day   Brief Narrative / Interim history: 60 year old male with alcohol abuse, homelessness currently living in a tent, COPD, DM 2, CAD, chronic back pain comes to the hospital with profuse diarrhea as well as weakness/dizziness.  He tells me his diarrhea is somewhat chronic going back a few months but in the last few days it has gotten significantly worse with more than 10 watery bowel movements per day.  He is also been extremely weak and dizzy upon standing and walking.  Subjective / 24h Interval events: Still having diarrhea, still having dizziness but a bit better today.  He is eating more.  Assessment & Plan: Principal Problem Dizziness-possibly related to EtOH intoxication as his levels were positive on admission.  MRI of the brain was negative for acute CVA.  He is also extremely weak possibly due to ongoing diarrhea.  Continue IV fluids, eating better.  PT recommending no follow-up.  Gradually improving  Active Problems Hyponatremia-due to dehydration, sodium improving with fluids.  Now eating well, DC fluids  Diarrhea-appears to be acute on chronic, C. difficile negative, will give Imodium  EtOH abuse-continue CIWA  Type 2 diabetes mellitus-continue sliding scale  Tobacco use-nicotine patch  CAD-no chest pain, appears stable  History of asthma-no wheezing, this is stable  Homelessness-lives in a tent, case manager consulted.  He does not seem interested in shelters  Scheduled Meds:  enoxaparin (LOVENOX) injection  40 mg Subcutaneous Q24H   folic acid  1 mg Oral Daily   Gerhardt's butt cream   Topical QID   insulin aspart  0-15 Units Subcutaneous TID WC   insulin aspart  0-5 Units Subcutaneous QHS   multivitamin with minerals  1 tablet Oral Daily   nicotine  21 mg Transdermal Daily   thiamine  100 mg Oral Daily   Or   thiamine  100 mg Intravenous  Daily   Continuous Infusions:  lactated ringers 100 mL/hr at 06/18/21 0445   PRN Meds:.guaiFENesin-dextromethorphan, LORazepam **OR** LORazepam, ondansetron **OR** ondansetron (ZOFRAN) IV  Diet Orders (From admission, onward)     Start     Ordered   06/17/21 0050  Diet heart healthy/carb modified Room service appropriate? Yes; Fluid consistency: Thin  Diet effective now       Question Answer Comment  Diet-HS Snack? Nothing   Room service appropriate? Yes   Fluid consistency: Thin      06/17/21 0049            DVT prophylaxis: enoxaparin (LOVENOX) injection 40 mg Start: 06/17/21 1000     Code Status: Full Code  Family Communication: No family at bedside  Status is: Observation  The patient will require care spanning > 2 midnights and should be moved to inpatient because: IV treatments appropriate due to intensity of illness or inability to take PO  Dispo: The patient is from: Home              Anticipated d/c is to: Home              Patient currently is not medically stable to d/c.   Difficult to place patient No   Level of care: Med-Surg  Consultants:  None  Procedures:  none  Microbiology  none  Antimicrobials: none    Objective: Vitals:   06/17/21 0515 06/17/21 0918 06/17/21 2027 06/18/21 0444  BP: (!) 144/78 134/74 133/71 (!) 110/49  Pulse:  99 99 93 79  Resp: 20 (!) 22 18 18   Temp: 99 F (37.2 C) 98 F (36.7 C) 98 F (36.7 C) 98.4 F (36.9 C)  TempSrc: Oral   Oral  SpO2: 96% 98% 94% 94%  Weight:      Height:        Intake/Output Summary (Last 24 hours) at 06/18/2021 1020 Last data filed at 06/18/2021 0114 Gross per 24 hour  Intake --  Output 675 ml  Net -675 ml    Filed Weights   06/16/21 1835  Weight: 64 kg    Examination:  Constitutional: No distress, in bed Eyes: No icterus ENMT: mmm Neck: normal, supple Respiratory: Clear bilaterally without wheezing or crackles Cardiovascular: Regular rate and rhythm, no murmurs, no  edema Abdomen: Soft, NT, ND, bowel sounds positive Musculoskeletal: no clubbing / cyanosis.  Skin: No rashes seen Neurologic: No focal deficits  Data Reviewed: I have independently reviewed following labs and imaging studies   CBC: Recent Labs  Lab 06/16/21 1908 06/17/21 0509 06/18/21 0603  WBC 7.9 6.7 6.5  NEUTROABS 4.8  --   --   HGB 11.9* 11.8* 11.5*  HCT 34.0* 34.4* 33.9*  MCV 97.4 98.6 99.4  PLT 76* 68* 55*    Basic Metabolic Panel: Recent Labs  Lab 06/16/21 1908 06/17/21 0509 06/18/21 0603  NA 126* 132* 130*  K 3.9 3.8 3.5  CL 94* 100 98  CO2 23 23 24   GLUCOSE 88 81 84  BUN <5* <5* 7  CREATININE 0.48* 0.48* 0.47*  CALCIUM 7.9* 8.0* 8.3*  MG 1.9 1.9  --   PHOS  --  3.4  --     Liver Function Tests: Recent Labs  Lab 06/16/21 1908 06/17/21 0509  AST 84* 86*  ALT 26 25  ALKPHOS 154* 138*  BILITOT 2.0* 2.6*  PROT 6.9 6.8  ALBUMIN 3.0* 3.0*    Coagulation Profile: No results for input(s): INR, PROTIME in the last 168 hours. HbA1C: Recent Labs    06/17/21 0509  HGBA1C 5.4    CBG: Recent Labs  Lab 06/17/21 0136 06/17/21 0727 06/17/21 1147 06/17/21 1642 06/18/21 0741  GLUCAP 85 92 83 111* 87     Recent Results (from the past 240 hour(s))  Resp Panel by RT-PCR (Flu A&B, Covid) Nasopharyngeal Swab     Status: None   Collection Time: 06/16/21 11:25 PM   Specimen: Nasopharyngeal Swab; Nasopharyngeal(NP) swabs in vial transport medium  Result Value Ref Range Status   SARS Coronavirus 2 by RT PCR NEGATIVE NEGATIVE Final    Comment: (NOTE) SARS-CoV-2 target nucleic acids are NOT DETECTED.  The SARS-CoV-2 RNA is generally detectable in upper respiratory specimens during the acute phase of infection. The lowest concentration of SARS-CoV-2 viral copies this assay can detect is 138 copies/mL. A negative result does not preclude SARS-Cov-2 infection and should not be used as the sole basis for treatment or other patient management decisions. A  negative result may occur with  improper specimen collection/handling, submission of specimen other than nasopharyngeal swab, presence of viral mutation(s) within the areas targeted by this assay, and inadequate number of viral copies(<138 copies/mL). A negative result must be combined with clinical observations, patient history, and epidemiological information. The expected result is Negative.  Fact Sheet for Patients:  06/20/21  Fact Sheet for Healthcare Providers:  06/18/21  This test is no t yet approved or cleared by the [employer] FDA and  has been authorized for detection and/or diagnosis of SARS-CoV-2  by FDA under an Emergency Use Authorization (EUA). This EUA will remain  in effect (meaning this test can be used) for the duration of the COVID-19 declaration under Section 564(b)(1) of the Act, 21 U.S.C.section 360bbb-3(b)(1), unless the authorization is terminated  or revoked sooner.       Influenza A by PCR NEGATIVE NEGATIVE Final   Influenza B by PCR NEGATIVE NEGATIVE Final    Comment: (NOTE) The Xpert Xpress SARS-CoV-2/FLU/RSV plus assay is intended as an aid in the diagnosis of influenza from Nasopharyngeal swab specimens and should not be used as a sole basis for treatment. Nasal washings and aspirates are unacceptable for Xpert Xpress SARS-CoV-2/FLU/RSV testing.  Fact Sheet for Patients: [employer]  Fact Sheet for Healthcare Providers: [employer]ious[employer]ker[employer]it  This test is not yet approved or cleared by the [place] FDA and has been authorized for detection and/or diagnosis of SARS-CoV-2 by FDA under an Emergency Use Authorization (EUA). This EUA will remain in effect (meaning this test can be used) for the duration of the COVID-19 declaration under Section 564(b)(1) of the Act, 21 U.S.C. section 360bbb-3(b)(1), unless the authorization  is terminated or revoked.  Performed at [facility], [address] [place], [place] [address]   C Difficile Quick Screen w PCR reflex     Status: None   Collection Time: 06/17/21  9:20 AM   Specimen: STOOL  Result Value Ref Range Status   C Diff antigen NEGATIVE NEGATIVE Final   C Diff toxin NEGATIVE NEGATIVE Final   C Diff interpretation No C. difficile detected.  Final    Comment: Performed at [facility], [address] [place], [place] [address]      Radiology Studies: No results found.  Pamella Pert, MD, PhD [facility]  Between 7 am - 7 pm I am available, please contact me via Amion (for emergencies) or Securechat (non urgent messages)  Between 7 pm - 7 am I am not available, please contact night coverage MD/APP via Amion

## 2021-06-18 NOTE — Progress Notes (Signed)
Physical Therapy Treatment Patient Details Name: Warren Salas MRN: [id number] DOB: 06-25-1961 Today's Date: 06/18/2021    History of Present Illness 60 year old male comes to the hospital with profuse diarrhea as well as weakness/dizziness.  Past medical history of alcohol abuse, homelessness currently living in a tent, COPD, DM 2, CAD, chronic back pain    PT Comments    Pt standing at bedside, reports just returned from restroom upon entry into room. Pt tolerates seated BLE strengthening exercises. Pt ambulates with IV pole, supv for majority of ambulation; min guard for safety when pt kicked IV pole wheels x3 instances, able to recover independently. Pt HR up to 108 max noted with ambulation, reports "same dizziness" throughout session that doesn't worsen or improve. Pt tolerates remaining up in chair at EOS. Will continue acute PT efforts.     Follow Up Recommendations  No PT follow up     Equipment Recommendations  Cane (pt reports not practical for RW in tent, agreeable to [facility])    Recommendations for Other Services       Precautions / Restrictions Precautions Precautions: Fall Restrictions Weight Bearing Restrictions: No    Mobility  Bed Mobility  General bed mobility comments: standing at bedside upon arrival    Transfers Overall transfer level: Needs assistance Equipment used: None Transfers: Sit to/from Stand Sit to Stand: Supervision    General transfer comment: BUE assisting to power to stand, supv for safety, slightly impulsive, VCs for IV line  Ambulation/Gait Ambulation/Gait assistance: Supervision;Min guard Gait Distance (Feet): 120 Feet Assistive device: IV Pole Gait Pattern/deviations: Step-through pattern;Decreased stride length;Narrow base of support Gait velocity: decreased   General Gait Details: pt ambulates with IV pole, kicks IV pole wheels x3 but able to recover independently with min guard for safety, supv when attending to IV  pole   Stairs             Wheelchair Mobility    Modified Rankin (Stroke Patients Only)       Balance Overall balance assessment: Mild deficits observed, not formally tested         Cognition Arousal/Alertness: Awake/alert Behavior During Therapy: WFL for tasks assessed/performed Overall Cognitive Status: Within Functional Limits for tasks assessed        Exercises General Exercises - Lower Extremity Long Arc Quad: Seated;AROM;Strengthening;Both;10 reps Hip Flexion/Marching: Seated;AROM;Strengthening;Both;10 reps    General Comments        Pertinent Vitals/Pain Pain Assessment: No/denies pain    Home Living                      Prior Function            PT Goals (current goals can now be found in the care plan section) Acute Rehab PT Goals PT Goal Formulation: With patient Time For Goal Achievement: 07/02/21 Potential to Achieve Goals: Good Progress towards PT goals: Progressing toward goals    Frequency    Min 3X/week      PT Plan Current plan remains appropriate    Co-evaluation              AM-PAC PT "6 Clicks" Mobility   Outcome Measure  Help needed turning from your back to your side while in a flat bed without using bedrails?: A Little Help needed moving from lying on your back to sitting on the side of a flat bed without using bedrails?: A Little Help needed moving to and from a bed to a chair (including a wheelchair)?:  A Little Help needed standing up from a chair using your arms (e.g., wheelchair or bedside chair)?: A Little Help needed to walk in hospital room?: A Little Help needed climbing 3-5 steps with a railing? : A Little 6 Click Score: 18    End of Session Equipment Utilized During Treatment: Gait belt Activity Tolerance: Patient tolerated treatment well Patient left: in chair;with call bell/phone within reach;with chair alarm set Nurse Communication: Mobility status PT Visit Diagnosis: Difficulty in  walking, not elsewhere classified (R26.2);Muscle weakness (generalized) (M62.81)     Time: 3086-5784 PT Time Calculation (min) (ACUTE ONLY): 22 min  Charges:  $Gait Training: 8-22 mins                      Tori York Valliant PT, DPT 06/18/21, 3:46 PM

## 2021-06-18 NOTE — TOC Progression Note (Signed)
Transition of Care [facility]) - Progression Note    Patient Details  Name: Warren Salas MRN: [id number] Date of Birth: 03-31-1961  Transition of Care [facility]) CM/SW Contact  Ida Rogue, [place] Phone Number: 06/18/2021, 10:45 AM  Clinical Narrative:   Patient seen in follow up to MD consult for alcohol use.  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  I can't walk that far.  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 Other than a bus pass, he identifies no other needs.  No help needed with housing nor with alcohol nor drug resources. TOC will continue to follow during the course of hospitalization.     Expected Discharge Plan: Home/Self Care Barriers to Discharge: Barriers Resolved  Expected Discharge Plan and Services Expected Discharge Plan: Home/Self Care In-house Referral: Clinical Social Work Discharge Planning Services: CM Consult   Living arrangements for the past 2 months: Homeless                                       Social Determinants of Health (SDOH) Interventions    Readmission Risk Interventions No flowsheet data found.

## 2021-06-19 LAB — GLUCOSE, CAPILLARY
Glucose-Capillary: 101 mg/dL — ABNORMAL HIGH (ref 70–99)
Glucose-Capillary: 109 mg/dL — ABNORMAL HIGH (ref 70–99)
Glucose-Capillary: 95 mg/dL (ref 70–99)
Glucose-Capillary: 86 mg/dL (ref 70–99)

## 2021-06-19 MED ORDER — HALOPERIDOL LACTATE 5 MG/ML IJ SOLN
5.0000 mg | Freq: Four times a day (QID) | INTRAMUSCULAR | Status: DC | PRN
  Administered 2021-06-19 – 2021-06-23 (×6): 5 mg via INTRAVENOUS
  Filled 2021-06-19 (×6): qty 1

## 2021-06-19 NOTE — Progress Notes (Addendum)
PROGRESS NOTE  Warren Salas LEX:[id number] DOB: 1961/07/30 DOA: 06/16/2021 PCP: Patient, No Pcp Per (Inactive)   LOS: 2 days   Brief Narrative / Interim history: 60 year old male with alcohol abuse, homelessness currently living in a tent, COPD, DM 2, CAD, chronic back pain comes to the hospital with profuse diarrhea as well as weakness/dizziness.  He tells me his diarrhea is somewhat chronic going back a few months but in the last few days it has gotten significantly worse with more than 10 watery bowel movements per day.  He is also been extremely weak and dizzy upon standing and walking.  Subjective / 24h Interval events:  More confused overnight, pulled his IVs wanting to go home  Assessment & Plan: Principal Problem EtOH abuse with significant withdrawals, confusion-continue CIWA, more so confused overnight last night roughly at 48-72 hours from last drink, this is expected.  Currently confused and not safe to be discharged.  Continue CIWA  Active Problems Hyponatremia-due to dehydration, sodium improving with fluids.  Eating well now  Diarrhea-appears to be acute on chronic, C. difficile negative, will give Imodium  Dizziness-possibly related to EtOH intoxication as his levels were positive on admission.  MRI of the brain was negative for acute CVA.  He is also extremely weak possibly due to ongoing diarrhea.  Improving  Type 2 diabetes mellitus-continue sliding scale  Tobacco use-nicotine patch  CAD-no chest pain, appears stable  History of asthma-no wheezing, this is stable  Homelessness-lives in a tent, case manager consulted.  He does not seem interested in shelters  Scheduled Meds:  enoxaparin (LOVENOX) injection  40 mg Subcutaneous Q24H   folic acid  1 mg Oral Daily   Gerhardt's butt cream   Topical QID   insulin aspart  0-15 Units Subcutaneous TID WC   insulin aspart  0-5 Units Subcutaneous QHS   multivitamin with minerals  1 tablet Oral Daily   nicotine  21 mg  Transdermal Daily   thiamine  100 mg Oral Daily   Or   thiamine  100 mg Intravenous Daily   Continuous Infusions:  lactated ringers 100 mL/hr at 06/19/21 0056   PRN Meds:.acetaminophen, guaiFENesin-dextromethorphan, haloperidol lactate, loperamide, LORazepam **OR** LORazepam, ondansetron **OR** ondansetron (ZOFRAN) IV  Diet Orders (From admission, onward)     Start     Ordered   06/17/21 0050  Diet heart healthy/carb modified Room service appropriate? Yes; Fluid consistency: Thin  Diet effective now       Question Answer Comment  Diet-HS Snack? Nothing   Room service appropriate? Yes   Fluid consistency: Thin      06/17/21 0049            DVT prophylaxis: enoxaparin (LOVENOX) injection 40 mg Start: 06/17/21 1000     Code Status: Full Code  Family Communication: No family at bedside  Status is: Observation  The patient will require care spanning > 2 midnights and should be moved to inpatient because: IV treatments appropriate due to intensity of illness or inability to take PO  Dispo: The patient is from: Home              Anticipated d/c is to: Home              Patient currently is not medically stable to d/c.   Difficult to place patient No   Level of care: Med-Surg  Consultants:  None  Procedures:  none  Microbiology  none  Antimicrobials: none    Objective: Vitals:   06/18/21  0444 06/18/21 1426 06/18/21 2014 06/19/21 0440  BP: (!) 110/49 131/63 (!) 141/79 134/62  Pulse: 79 91 94 84  Resp: 18 (!) 24 18 18   Temp: 98.4 F (36.9 C) 98.5 F (36.9 C) 99.8 F (37.7 C) 98.5 F (36.9 C)  TempSrc: Oral Oral    SpO2: 94% 97% 97% 98%  Weight:      Height:        Intake/Output Summary (Last 24 hours) at 06/19/2021 1054 Last data filed at 06/19/2021 0813 Gross per 24 hour  Intake 3952.07 ml  Output 425 ml  Net 3527.07 ml    Filed Weights   06/16/21 1835  Weight: 64 kg    Examination:  Constitutional: Restless, standing in the bathroom  with blood on his hands and on the floor Eyes: No icterus ENMT: mmm Neck: normal, supple Respiratory: Clear bilaterally, no wheezing, no crackles Cardiovascular: Regular rate and rhythm, no murmurs, no edema Abdomen: Soft, nontender, nondistended, bowel sounds positive Musculoskeletal: no clubbing / cyanosis.  Skin: No rashes seen Neurologic: No focal deficits  Data Reviewed: I have independently reviewed following labs and imaging studies   CBC: Recent Labs  Lab 06/16/21 1908 06/17/21 0509 06/18/21 0603  WBC 7.9 6.7 6.5  NEUTROABS 4.8  --   --   HGB 11.9* 11.8* 11.5*  HCT 34.0* 34.4* 33.9*  MCV 97.4 98.6 99.4  PLT 76* 68* 55*    Basic Metabolic Panel: Recent Labs  Lab 06/16/21 1908 06/17/21 0509 06/18/21 0603  NA 126* 132* 130*  K 3.9 3.8 3.5  CL 94* 100 98  CO2 23 23 24   GLUCOSE 88 81 84  BUN <5* <5* 7  CREATININE 0.48* 0.48* 0.47*  CALCIUM 7.9* 8.0* 8.3*  MG 1.9 1.9  --   PHOS  --  3.4  --     Liver Function Tests: Recent Labs  Lab 06/16/21 1908 06/17/21 0509  AST 84* 86*  ALT 26 25  ALKPHOS 154* 138*  BILITOT 2.0* 2.6*  PROT 6.9 6.8  ALBUMIN 3.0* 3.0*    Coagulation Profile: No results for input(s): INR, PROTIME in the last 168 hours. HbA1C: Recent Labs    06/17/21 0509  HGBA1C 5.4    CBG: Recent Labs  Lab 06/18/21 0741 06/18/21 1130 06/18/21 1700 06/18/21 2150 06/19/21 0746  GLUCAP 87 127* 103* 142* 101*     Recent Results (from the past 240 hour(s))  Resp Panel by RT-PCR (Flu A&B, Covid) Nasopharyngeal Swab     Status: None   Collection Time: 06/16/21 11:25 PM   Specimen: Nasopharyngeal Swab; Nasopharyngeal(NP) swabs in vial transport medium  Result Value Ref Range Status   SARS Coronavirus 2 by RT PCR NEGATIVE NEGATIVE Final    Comment: (NOTE) SARS-CoV-2 target nucleic acids are NOT DETECTED.  The SARS-CoV-2 RNA is generally detectable in upper respiratory specimens during the acute phase of infection. The  lowest concentration of SARS-CoV-2 viral copies this assay can detect is 138 copies/mL. A negative result does not preclude SARS-Cov-2 infection and should not be used as the sole basis for treatment or other patient management decisions. A negative result may occur with  improper specimen collection/handling, submission of specimen other than nasopharyngeal swab, presence of viral mutation(s) within the areas targeted by this assay, and inadequate number of viral copies(<138 copies/mL). A negative result must be combined with clinical observations, patient history, and epidemiological information. The expected result is Negative.  Fact Sheet for Patients:  06/21/21  Fact Sheet for Healthcare Providers:  SeriousBroker.it  This test is no t yet approved or cleared by the [place] and  has been authorized for detection and/or diagnosis of SARS-CoV-2 by FDA under an Emergency Use Authorization (EUA). This EUA will remain  in effect (meaning this test can be used) for the duration of the COVID-19 declaration under Section 564(b)(1) of the Act, 21 U.S.C.section 360bbb-3(b)(1), unless the authorization is terminated  or revoked sooner.       Influenza A by PCR NEGATIVE NEGATIVE Final   Influenza B by PCR NEGATIVE NEGATIVE Final    Comment: (NOTE) The Xpert Xpress SARS-CoV-2/FLU/RSV plus assay is intended as an aid in the diagnosis of influenza from Nasopharyngeal swab specimens and should not be used as a sole basis for treatment. Nasal washings and aspirates are unacceptable for Xpert Xpress SARS-CoV-2/FLU/RSV testing.  Fact Sheet for Patients: BloggerCourse.com  Fact Sheet for Healthcare Providers: SeriousBroker.it  This test is not yet approved or cleared by the [place] FDA and has been authorized for detection and/or diagnosis of SARS-CoV-2 by FDA under  an Emergency Use Authorization (EUA). This EUA will remain in effect (meaning this test can be used) for the duration of the COVID-19 declaration under Section 564(b)(1) of the Act, 21 U.S.C. section 360bbb-3(b)(1), unless the authorization is terminated or revoked.  Performed at [facility], [address], [place], [place] [address]   C Difficile Quick Screen w PCR reflex     Status: None   Collection Time: 06/17/21  9:20 AM   Specimen: STOOL  Result Value Ref Range Status   C Diff antigen NEGATIVE NEGATIVE Final   C Diff toxin NEGATIVE NEGATIVE Final   C Diff interpretation No C. difficile detected.  Final    Comment: Performed at [facility], [address] [place], [place] [address]  Gastrointestinal Panel by PCR , Stool     Status: None   Collection Time: 06/17/21  9:20 AM   Specimen: STOOL  Result Value Ref Range Status   Campylobacter species NOT DETECTED NOT DETECTED Final   Plesimonas shigelloides NOT DETECTED NOT DETECTED Final   Salmonella species NOT DETECTED NOT DETECTED Final   Yersinia enterocolitica NOT DETECTED NOT DETECTED Final   Vibrio species NOT DETECTED NOT DETECTED Final   Vibrio cholerae NOT DETECTED NOT DETECTED Final   Enteroaggregative E coli (EAEC) NOT DETECTED NOT DETECTED Final   Enteropathogenic E coli (EPEC) NOT DETECTED NOT DETECTED Final   Enterotoxigenic E coli (ETEC) NOT DETECTED NOT DETECTED Final   Shiga like toxin producing E coli (STEC) NOT DETECTED NOT DETECTED Final   Shigella/Enteroinvasive E coli (EIEC) NOT DETECTED NOT DETECTED Final   Cryptosporidium NOT DETECTED NOT DETECTED Final   Cyclospora cayetanensis NOT DETECTED NOT DETECTED Final   Entamoeba histolytica NOT DETECTED NOT DETECTED Final   Giardia lamblia NOT DETECTED NOT DETECTED Final   Adenovirus F40/41 NOT DETECTED NOT DETECTED Final   Astrovirus NOT DETECTED NOT DETECTED Final   Norovirus GI/GII NOT DETECTED NOT DETECTED Final    Rotavirus A NOT DETECTED NOT DETECTED Final   Sapovirus (I, II, IV, and V) NOT DETECTED NOT DETECTED Final    Comment: Performed at [facility], [address], [place], [place] [address]      Radiology Studies: No results found.  Pamella Pert, MD, PhD [facility]  Between 7 am - 7 pm I am available, please contact me via Amion (for emergencies) or Securechat (non urgent messages)  Between 7  pm - 7 am I am not available, please contact night coverage MD/APP via Amion

## 2021-06-19 NOTE — Progress Notes (Signed)
Patient has been making multiple attempts to exit the bed over the siderails, is pulling at IV, and has become verbally aggressive with staff.  Patient has also attempted to hit several staff members so hand mitts were applied.  MD contacted regarding the possibility of restraints and order is being placed.

## 2021-06-19 NOTE — Progress Notes (Signed)
Patient having N/V, increased agitation, headache, anxiety w/tremors, removing his gown and tele box. PRN meds administered. This nurse went back to do f/u and patient had removed his IV in his left AC. Will update on-coming nurse and put in consult for IV team.

## 2021-06-19 NOTE — Progress Notes (Signed)
This nurse and night shift nurse entered room for bedside shift report to find patient in the bathroom and blood all over the room. Patient had removed the IV that was inserted just prior to shift change and the site was bleeding profusely.  Pressure applied to site until bleeding stopped and gauze dressing applied.  Patient was oriented to person only at this time.  Patient was bathed, a new gown was put on him and the bed linens were changed.  Patient was assisted back to the bed with siderails up x4 and bed alarm on.  MD and charge nurse aware. Housekeeping called to room.  Will continue to monitor patient throughout shift

## 2021-06-19 NOTE — Progress Notes (Signed)
Soft wrist restraints ordered and applied.  Patient made several attempts to hit staff while restraints were being applied.  Siderails are up x4, bed is in lowest position with bed alarm on, and the callbell is within patient's reach at this time

## 2021-06-20 LAB — COMPREHENSIVE METABOLIC PANEL
ALT: 20 U/L (ref 0–44)
AST: 53 U/L — ABNORMAL HIGH (ref 15–41)
Albumin: 2.9 g/dL — ABNORMAL LOW (ref 3.5–5.0)
Alkaline Phosphatase: 106 U/L (ref 38–126)
Anion gap: 8 (ref 5–15)
BUN: 5 mg/dL — ABNORMAL LOW (ref 6–20)
CO2: 25 mmol/L (ref 22–32)
Calcium: 8.4 mg/dL — ABNORMAL LOW (ref 8.9–10.3)
Chloride: 99 mmol/L (ref 98–111)
Creatinine, Ser: 0.53 mg/dL — ABNORMAL LOW (ref 0.61–1.24)
GFR, Estimated: >60 mL/min (ref >60)
Glucose, Bld: 103 mg/dL — ABNORMAL HIGH (ref 70–99)
Potassium: 3.4 mmol/L — ABNORMAL LOW (ref 3.5–5.1)
Sodium: 132 mmol/L — ABNORMAL LOW (ref 135–145)
Total Bilirubin: 3.2 mg/dL — ABNORMAL HIGH (ref 0.3–1.2)
Total Protein: 6.6 g/dL (ref 6.5–8.1)

## 2021-06-20 LAB — GLUCOSE, CAPILLARY
Glucose-Capillary: 92 mg/dL (ref 70–99)
Glucose-Capillary: 113 mg/dL — ABNORMAL HIGH (ref 70–99)
Glucose-Capillary: 116 mg/dL — ABNORMAL HIGH (ref 70–99)
Glucose-Capillary: 121 mg/dL — ABNORMAL HIGH (ref 70–99)

## 2021-06-20 LAB — CBC
HCT: 37.1 % — ABNORMAL LOW (ref 39.0–52.0)
Hemoglobin: 12.4 g/dL — ABNORMAL LOW (ref 13.0–17.0)
MCH: 34.2 pg — ABNORMAL HIGH (ref 26.0–34.0)
MCHC: 33.4 g/dL (ref 30.0–36.0)
MCV: 102.2 fL — ABNORMAL HIGH (ref 80.0–100.0)
Platelets: 67 10*3/uL — ABNORMAL LOW (ref 150–400)
RBC: 3.63 MIL/uL — ABNORMAL LOW (ref 4.22–5.81)
RDW: 18.5 % — ABNORMAL HIGH (ref 11.5–15.5)
WBC: 6.8 10*3/uL (ref 4.0–10.5)
nRBC: 0 % (ref 0.0–0.2)

## 2021-06-20 MED ORDER — LORAZEPAM 1 MG PO TABS
1.0000 mg | ORAL_TABLET | ORAL | Status: AC | PRN
  Administered 2021-06-20: 1 mg via ORAL
  Filled 2021-06-20: qty 1

## 2021-06-20 MED ORDER — LORAZEPAM 2 MG/ML IJ SOLN
1.0000 mg | INTRAMUSCULAR | Status: AC | PRN
  Administered 2021-06-20 – 2021-06-22 (×10): 2 mg via INTRAVENOUS
  Administered 2021-06-23: 3 mg via INTRAVENOUS
  Filled 2021-06-20 (×8): qty 1
  Filled 2021-06-20: qty 2
  Filled 2021-06-20 (×2): qty 1

## 2021-06-20 NOTE — Progress Notes (Signed)
PROGRESS NOTE  Warren Salas HOZ:[id number] DOB: 1961-05-27 DOA: 06/16/2021 PCP: Patient, No Pcp Per (Inactive)   LOS: 3 days   Brief Narrative / Interim history: 60 year old male with alcohol abuse, homelessness currently living in a tent, COPD, DM 2, CAD, chronic back pain comes to the hospital with profuse diarrhea as well as weakness/dizziness.  He tells me his diarrhea is somewhat chronic going back a few months but in the last few days it has gotten significantly worse with more than 10 watery bowel movements per day.  He is also been extremely weak and dizzy upon standing and walking.  Subjective / 24h Interval events:  Confused overnight, lethargic this morning.  Had to be placed on restraints and has a bedside sitter  Assessment & Plan: Principal Problem EtOH abuse with significant withdrawals, confusion-continue CIWA, continues to be delirious and confused at times.  Continue sitter, supportive care  Active Problems Hyponatremia-due to dehydration, sodium improving with fluids.  132 this morning, DC fluids  Diarrhea-appears to be acute on chronic, C. difficile negative, continue Imodium  Dizziness-possibly related to EtOH intoxication as his levels were positive on admission.  MRI of the brain was negative for acute CVA.  He is also extremely weak possibly due to ongoing diarrhea.  Type 2 diabetes mellitus-continue sliding scale  Tobacco use-nicotine patch  CAD-no chest pain, appears stable  History of asthma-no wheezing, this is stable  Homelessness-lives in a tent, case manager consulted.  He does not seem interested in shelters, will likely go back to his tent once withdrawals improve  Scheduled Meds:  enoxaparin (LOVENOX) injection  40 mg Subcutaneous Q24H   folic acid  1 mg Oral Daily   Gerhardt's butt cream   Topical QID   insulin aspart  0-15 Units Subcutaneous TID WC   insulin aspart  0-5 Units Subcutaneous QHS   multivitamin with minerals  1 tablet Oral  Daily   nicotine  21 mg Transdermal Daily   thiamine  100 mg Oral Daily   Or   thiamine  100 mg Intravenous Daily   Continuous Infusions:   PRN Meds:.acetaminophen, guaiFENesin-dextromethorphan, haloperidol lactate, loperamide, LORazepam **OR** LORazepam, ondansetron **OR** ondansetron (ZOFRAN) IV  Diet Orders (From admission, onward)     Start     Ordered   06/17/21 0050  Diet heart healthy/carb modified Room service appropriate? Yes; Fluid consistency: Thin  Diet effective now       Question Answer Comment  Diet-HS Snack? Nothing   Room service appropriate? Yes   Fluid consistency: Thin      06/17/21 0049            DVT prophylaxis: enoxaparin (LOVENOX) injection 40 mg Start: 06/17/21 1000     Code Status: Full Code  Family Communication: No family at bedside  Status is: Inpatient   IV treatments appropriate due to intensity of illness or inability to take PO  Dispo: The patient is from: Home              Anticipated d/c is to: Home              Patient currently is not medically stable to d/c.   Difficult to place patient No   Level of care: Med-Surg  Consultants:  None  Procedures:  none  Microbiology  none  Antimicrobials: none    Objective: Vitals:   06/19/21 2015 06/20/21 0048 06/20/21 0056 06/20/21 0544  BP: 124/66 (!) 143/77 128/67 132/71  Pulse: 82 (!) 153 98 95  Resp: 16 (!) 22 (!) 22 18  Temp: 98.7 F (37.1 C) 100.2 F (37.9 C) 100.2 F (37.9 C) 98.6 F (37 C)  TempSrc: Oral Oral Oral Oral  SpO2: 93% 100% 91% 96%  Weight:      Height:        Intake/Output Summary (Last 24 hours) at 06/20/2021 1012 Last data filed at 06/20/2021 0602 Gross per 24 hour  Intake 0 ml  Output 1050 ml  Net -1050 ml    Filed Weights   06/16/21 1835  Weight: 64 kg    Examination:  Constitutional: Lethargic Eyes: No icterus ENMT: Moist mucous membranes Neck: normal, supple Respiratory: Clear bilaterally, no wheezing or crackles  heard Cardiovascular: Regular rate and rhythm, no murmurs, no edema Abdomen: Soft, NT, ND, bowel sounds positive Musculoskeletal: no clubbing / cyanosis.  Skin: No rashes seen Neurologic: No focal deficits  Data Reviewed: I have independently reviewed following labs and imaging studies   CBC: Recent Labs  Lab 06/16/21 1908 06/17/21 0509 06/18/21 0603 06/20/21 0636  WBC 7.9 6.7 6.5 6.8  NEUTROABS 4.8  --   --   --   HGB 11.9* 11.8* 11.5* 12.4*  HCT 34.0* 34.4* 33.9* 37.1*  MCV 97.4 98.6 99.4 102.2*  PLT 76* 68* 55* 67*    Basic Metabolic Panel: Recent Labs  Lab 06/16/21 1908 06/17/21 0509 06/18/21 0603 06/20/21 0636  NA 126* 132* 130* 132*  K 3.9 3.8 3.5 3.4*  CL 94* 100 98 99  CO2 23 23 24 25   GLUCOSE 88 81 84 103*  BUN <5* <5* 7 5*  CREATININE 0.48* 0.48* 0.47* 0.53*  CALCIUM 7.9* 8.0* 8.3* 8.4*  MG 1.9 1.9  --   --   PHOS  --  3.4  --   --     Liver Function Tests: Recent Labs  Lab 06/16/21 1908 06/17/21 0509 06/20/21 0636  AST 84* 86* 53*  ALT 26 25 20   ALKPHOS 154* 138* 106  BILITOT 2.0* 2.6* 3.2*  PROT 6.9 6.8 6.6  ALBUMIN 3.0* 3.0* 2.9*    Coagulation Profile: No results for input(s): INR, PROTIME in the last 168 hours. HbA1C: No results for input(s): HGBA1C in the last 72 hours.  CBG: Recent Labs  Lab 06/19/21 0746 06/19/21 1143 06/19/21 1619 06/19/21 2137 06/20/21 0758  GLUCAP 101* 109* 95 86 92     Recent Results (from the past 240 hour(s))  Resp Panel by RT-PCR (Flu A&B, Covid) Nasopharyngeal Swab     Status: None   Collection Time: 06/16/21 11:25 PM   Specimen: Nasopharyngeal Swab; Nasopharyngeal(NP) swabs in vial transport medium  Result Value Ref Range Status   SARS Coronavirus 2 by RT PCR NEGATIVE NEGATIVE Final    Comment: (NOTE) SARS-CoV-2 target nucleic acids are NOT DETECTED.  The SARS-CoV-2 RNA is generally detectable in upper respiratory specimens during the acute phase of infection. The lowest concentration of  SARS-CoV-2 viral copies this assay can detect is 138 copies/mL. A negative result does not preclude SARS-Cov-2 infection and should not be used as the sole basis for treatment or other patient management decisions. A negative result may occur with  improper specimen collection/handling, submission of specimen other than nasopharyngeal swab, presence of viral mutation(s) within the areas targeted by this assay, and inadequate number of viral copies(<138 copies/mL). A negative result must be combined with clinical observations, patient history, and epidemiological information. The expected result is Negative.  Fact Sheet for Patients:  06/22/21  Fact Sheet for Healthcare  Providers:  SeriousBroker.it  This test is no t yet approved or cleared by the [place] and  has been authorized for detection and/or diagnosis of SARS-CoV-2 by FDA under an Emergency Use Authorization (EUA). This EUA will remain  in effect (meaning this test can be used) for the duration of the COVID-19 declaration under Section 564(b)(1) of the Act, 21 U.S.C.section 360bbb-3(b)(1), unless the authorization is terminated  or revoked sooner.       Influenza A by PCR NEGATIVE NEGATIVE Final   Influenza B by PCR NEGATIVE NEGATIVE Final    Comment: (NOTE) The Xpert Xpress SARS-CoV-2/FLU/RSV plus assay is intended as an aid in the diagnosis of influenza from Nasopharyngeal swab specimens and should not be used as a sole basis for treatment. Nasal washings and aspirates are unacceptable for Xpert Xpress SARS-CoV-2/FLU/RSV testing.  Fact Sheet for Patients: BloggerCourse.com  Fact Sheet for Healthcare Providers: SeriousBroker.it  This test is not yet approved or cleared by the [place] FDA and has been authorized for detection and/or diagnosis of SARS-CoV-2 by FDA under an Emergency Use  Authorization (EUA). This EUA will remain in effect (meaning this test can be used) for the duration of the COVID-19 declaration under Section 564(b)(1) of the Act, 21 U.S.C. section 360bbb-3(b)(1), unless the authorization is terminated or revoked.  Performed at [facility], [address] [place], [place] [address]   C Difficile Quick Screen w PCR reflex     Status: None   Collection Time: 06/17/21  9:20 AM   Specimen: STOOL  Result Value Ref Range Status   C Diff antigen NEGATIVE NEGATIVE Final   C Diff toxin NEGATIVE NEGATIVE Final   C Diff interpretation No C. difficile detected.  Final    Comment: Performed at [facility], [address] [place], [place] [address]  Gastrointestinal Panel by PCR , Stool     Status: None   Collection Time: 06/17/21  9:20 AM   Specimen: STOOL  Result Value Ref Range Status   Campylobacter species NOT DETECTED NOT DETECTED Final   Plesimonas shigelloides NOT DETECTED NOT DETECTED Final   Salmonella species NOT DETECTED NOT DETECTED Final   Yersinia enterocolitica NOT DETECTED NOT DETECTED Final   Vibrio species NOT DETECTED NOT DETECTED Final   Vibrio cholerae NOT DETECTED NOT DETECTED Final   Enteroaggregative E coli (EAEC) NOT DETECTED NOT DETECTED Final   Enteropathogenic E coli (EPEC) NOT DETECTED NOT DETECTED Final   Enterotoxigenic E coli (ETEC) NOT DETECTED NOT DETECTED Final   Shiga like toxin producing E coli (STEC) NOT DETECTED NOT DETECTED Final   Shigella/Enteroinvasive E coli (EIEC) NOT DETECTED NOT DETECTED Final   Cryptosporidium NOT DETECTED NOT DETECTED Final   Cyclospora cayetanensis NOT DETECTED NOT DETECTED Final   Entamoeba histolytica NOT DETECTED NOT DETECTED Final   Giardia lamblia NOT DETECTED NOT DETECTED Final   Adenovirus F40/41 NOT DETECTED NOT DETECTED Final   Astrovirus NOT DETECTED NOT DETECTED Final   Norovirus GI/GII NOT DETECTED NOT DETECTED Final   Rotavirus A NOT  DETECTED NOT DETECTED Final   Sapovirus (I, II, IV, and V) NOT DETECTED NOT DETECTED Final    Comment: Performed at [facility], [address], [place], [place] [address]      Radiology Studies: No results found.  Pamella Pert, MD, PhD [facility]  Between 7 am - 7 pm I am available, please contact me via Amion (for emergencies) or Securechat (non urgent messages)  Between 7 pm - 7 am I am not available, please contact night coverage MD/APP via Amion

## 2021-06-20 NOTE — Progress Notes (Addendum)
Pts heart rate sustained in the 150s for approximately 10-15 minutes. Pt is resting in bed with eyes closed. Pt did awaken to stimuli, assessment unchanged. Other vitals stable. Linton Flemings NP notified and no new orders given. Pts heart rate returned to the 90s spontaneously and new vitals obtained and stable. Charge nurse made aware.

## 2021-06-21 ENCOUNTER — Inpatient Hospital Stay (HOSPITAL_COMMUNITY): Payer: Medicare Other

## 2021-06-21 DIAGNOSIS — R9431 Abnormal electrocardiogram [ECG] [EKG]: Secondary | ICD-10-CM

## 2021-06-21 LAB — GLUCOSE, CAPILLARY
Glucose-Capillary: 100 mg/dL — ABNORMAL HIGH (ref 70–99)
Glucose-Capillary: 97 mg/dL (ref 70–99)
Glucose-Capillary: 118 mg/dL — ABNORMAL HIGH (ref 70–99)
Glucose-Capillary: 100 mg/dL — ABNORMAL HIGH (ref 70–99)

## 2021-06-21 LAB — ECHOCARDIOGRAM COMPLETE
Area-P 1/2: 3.85 cm2
Height: 66 in
S' Lateral: 3.9 cm
Weight: 2257.51 oz

## 2021-06-21 MED ORDER — DILTIAZEM HCL-DEXTROSE 125-5 MG/125ML-% IV SOLN (PREMIX)
5.0000 mg/h | INTRAVENOUS | Status: DC
  Filled 2021-06-21: qty 125

## 2021-06-21 MED ORDER — LEVALBUTEROL HCL 0.63 MG/3ML IN NEBU: 0.6300 mg | INHALATION_SOLUTION | Freq: Four times a day (QID) | RESPIRATORY_TRACT | Status: DC | PRN

## 2021-06-21 MED ORDER — LIP MEDEX EX OINT
TOPICAL_OINTMENT | CUTANEOUS | Status: AC
  Filled 2021-06-21: qty 7

## 2021-06-21 MED ORDER — METOPROLOL TARTRATE 25 MG PO TABS
25.0000 mg | ORAL_TABLET | Freq: Two times a day (BID) | ORAL | Status: DC
  Administered 2021-06-21 – 2021-06-22 (×2): 25 mg via ORAL
  Filled 2021-06-21 (×2): qty 1

## 2021-06-21 MED ORDER — DILTIAZEM LOAD VIA INFUSION
10.0000 mg | Freq: Once | INTRAVENOUS | Status: DC
  Filled 2021-06-21: qty 10

## 2021-06-21 MED ORDER — METOPROLOL TARTRATE 25 MG PO TABS
12.5000 mg | ORAL_TABLET | Freq: Two times a day (BID) | ORAL | Status: DC
  Administered 2021-06-21: 12.5 mg via ORAL
  Filled 2021-06-21: qty 1

## 2021-06-21 MED ORDER — METOPROLOL TARTRATE 5 MG/5ML IV SOLN
INTRAVENOUS | Status: AC
  Filled 2021-06-21: qty 5

## 2021-06-21 MED ORDER — METOPROLOL TARTRATE 5 MG/5ML IV SOLN
2.5000 mg | Freq: Once | INTRAVENOUS | Status: AC
  Administered 2021-06-21: 2.5 mg via INTRAVENOUS

## 2021-06-21 MED ORDER — IPRATROPIUM-ALBUTEROL 0.5-2.5 (3) MG/3ML IN SOLN: 3.0000 mL | Freq: Four times a day (QID) | RESPIRATORY_TRACT | Status: DC | PRN

## 2021-06-21 MED ORDER — DILTIAZEM HCL-DEXTROSE 125-5 MG/125ML-% IV SOLN (PREMIX)
5.0000 mg/h | INTRAVENOUS | Status: DC
  Administered 2021-06-21: 5 mg/h via INTRAVENOUS
  Filled 2021-06-21: qty 125

## 2021-06-21 MED ORDER — DILTIAZEM LOAD VIA INFUSION
5.0000 mg | Freq: Once | INTRAVENOUS | Status: AC
  Administered 2021-06-21: 5 mg via INTRAVENOUS
  Filled 2021-06-21: qty 5

## 2021-06-21 NOTE — Progress Notes (Signed)
PROGRESS NOTE  Warren Salas GMW:[id number] DOB: 1961-03-31 DOA: 06/16/2021 PCP: Patient, No Pcp Per (Inactive)   LOS: 4 days   Brief Narrative / Interim history: 60 year old male with alcohol abuse, homelessness currently living in a tent, COPD, DM 2, CAD, chronic back pain comes to the hospital with profuse diarrhea as well as weakness/dizziness.  He tells me his diarrhea is somewhat chronic going back a few months but in the last few days it has gotten significantly worse with more than 10 watery bowel movements per day.  He is also been extremely weak and dizzy upon standing and walking.  Subjective / 24h Interval events:  Is sleepy this morning, but wakes up and is a bit groggy.  Not agitated.  Denies any chest pain, denies any shortness of breath.  Developed A. fib with RVR  Assessment & Plan: Principal Problem EtOH abuse with significant withdrawals, confusion-continue CIWA, appears little bit better this morning, not as confused and agitated as yesterday  Active Problems Paroxysmal A. fib, with RVR-new onset this morning, started on oral metoprolol and converted back to sinus rhythm.  This is likely in the setting of acute illness, EtOH.  Hold off on putting him on anticoagulation given short-lived episode, if he has recurrent A. fib then he may need to be on [facility], however his homelessness may be a barrier  Hyponatremia-due to dehydration, sodium improved with fluids  Diarrhea-appears to be acute on chronic, C. difficile negative, continue Imodium  Dizziness-possibly related to EtOH intoxication as his levels were positive on admission.  MRI of the brain was negative for acute CVA.  He is also extremely weak possibly due to ongoing diarrhea.  Type 2 diabetes mellitus-continue sliding scale  Tobacco use-nicotine patch  CAD-no chest pain, appears stable  History of asthma-no wheezing, this is stable  Homelessness-lives in a tent, case manager consulted.  He does not seem  interested in shelters, will likely go back to his tent once withdrawals improve  Scheduled Meds:  enoxaparin (LOVENOX) injection  40 mg Subcutaneous Q24H   folic acid  1 mg Oral Daily   Gerhardt's butt cream   Topical QID   insulin aspart  0-15 Units Subcutaneous TID WC   insulin aspart  0-5 Units Subcutaneous QHS   lip balm       metoprolol tartrate       metoprolol tartrate  12.5 mg Oral BID   multivitamin with minerals  1 tablet Oral Daily   nicotine  21 mg Transdermal Daily   thiamine  100 mg Oral Daily   Or   thiamine  100 mg Intravenous Daily   Continuous Infusions:   PRN Meds:.acetaminophen, guaiFENesin-dextromethorphan, haloperidol lactate, levalbuterol, loperamide, LORazepam **OR** LORazepam, ondansetron **OR** ondansetron (ZOFRAN) IV  Diet Orders (From admission, onward)     Start     Ordered   06/20/21 1048  DIET SOFT Room service appropriate? Yes; Fluid consistency: Thin  Diet effective now       Question Answer Comment  Room service appropriate? Yes   Fluid consistency: Thin      06/20/21 1047            DVT prophylaxis: enoxaparin (LOVENOX) injection 40 mg Start: 06/17/21 1000     Code Status: Full Code  Family Communication: No family at bedside  Status is: Inpatient   IV treatments appropriate due to intensity of illness or inability to take PO  Dispo: The patient is from: Home  Anticipated d/c is to: Home              Patient currently is not medically stable to d/c.   Difficult to place patient No   Level of care: Progressive  Consultants:  None  Procedures:  none  Microbiology  none  Antimicrobials: none    Objective: Vitals:   06/21/21 0606 06/21/21 0827 06/21/21 0828 06/21/21 0926  BP: 131/69 (!) 147/71 (!) 80/61 122/89  Pulse: 83  (!) 149 (!) 146  Resp: 18     Temp: 98.7 F (37.1 C)     TempSrc:      SpO2: 93%  96% 94%  Weight:      Height:        Intake/Output Summary (Last 24 hours) at 06/21/2021  1015 Last data filed at 06/20/2021 2209 Gross per 24 hour  Intake 180 ml  Output 400 ml  Net -220 ml    Filed Weights   06/16/21 1835  Weight: 64 kg    Examination:  Constitutional: Lethargic Eyes: No scleral icterus ENMT: mmm Neck: normal, supple Respiratory: Faint end expiratory wheezing bilaterally Cardiovascular: Tachycardic, irregular, no murmurs heard Abdomen: Soft, NT, ND, bowel sounds positive Musculoskeletal: no clubbing / cyanosis.  Skin: No rashes seen Neurologic: Nonfocal  Data Reviewed: I have independently reviewed following labs and imaging studies   CBC: Recent Labs  Lab 06/16/21 1908 06/17/21 0509 06/18/21 0603 06/20/21 0636  WBC 7.9 6.7 6.5 6.8  NEUTROABS 4.8  --   --   --   HGB 11.9* 11.8* 11.5* 12.4*  HCT 34.0* 34.4* 33.9* 37.1*  MCV 97.4 98.6 99.4 102.2*  PLT 76* 68* 55* 67*    Basic Metabolic Panel: Recent Labs  Lab 06/16/21 1908 06/17/21 0509 06/18/21 0603 06/20/21 0636  NA 126* 132* 130* 132*  K 3.9 3.8 3.5 3.4*  CL 94* 100 98 99  CO2 23 23 24 25   GLUCOSE 88 81 84 103*  BUN <5* <5* 7 5*  CREATININE 0.48* 0.48* 0.47* 0.53*  CALCIUM 7.9* 8.0* 8.3* 8.4*  MG 1.9 1.9  --   --   PHOS  --  3.4  --   --     Liver Function Tests: Recent Labs  Lab 06/16/21 1908 06/17/21 0509 06/20/21 0636  AST 84* 86* 53*  ALT 26 25 20   ALKPHOS 154* 138* 106  BILITOT 2.0* 2.6* 3.2*  PROT 6.9 6.8 6.6  ALBUMIN 3.0* 3.0* 2.9*    Coagulation Profile: No results for input(s): INR, PROTIME in the last 168 hours. HbA1C: No results for input(s): HGBA1C in the last 72 hours.  CBG: Recent Labs  Lab 06/20/21 0758 06/20/21 1144 06/20/21 1658 06/20/21 2117 06/21/21 0731  GLUCAP 92 113* 116* 121* 100*     Recent Results (from the past 240 hour(s))  Resp Panel by RT-PCR (Flu A&B, Covid) Nasopharyngeal Swab     Status: None   Collection Time: 06/16/21 11:25 PM   Specimen: Nasopharyngeal Swab; Nasopharyngeal(NP) swabs in vial transport medium   Result Value Ref Range Status   SARS Coronavirus 2 by RT PCR NEGATIVE NEGATIVE Final    Comment: (NOTE) SARS-CoV-2 target nucleic acids are NOT DETECTED.  The SARS-CoV-2 RNA is generally detectable in upper respiratory specimens during the acute phase of infection. The lowest concentration of SARS-CoV-2 viral copies this assay can detect is 138 copies/mL. A negative result does not preclude SARS-Cov-2 infection and should not be used as the sole basis for treatment or other patient management  decisions. A negative result may occur with  improper specimen collection/handling, submission of specimen other than nasopharyngeal swab, presence of viral mutation(s) within the areas targeted by this assay, and inadequate number of viral copies(<138 copies/mL). A negative result must be combined with clinical observations, patient history, and epidemiological information. The expected result is Negative.  Fact Sheet for Patients:  BloggerCourse.comhttps://www.fda.gov/media/152166/download  Fact Sheet for Healthcare Providers:  SeriousBroker.ithttps://www.fda.gov/media/152162/download  This test is no t yet approved or cleared by the [place] FDA and  has been authorized for detection and/or diagnosis of SARS-CoV-2 by FDA under an Emergency Use Authorization (EUA). This EUA will remain  in effect (meaning this test can be used) for the duration of the COVID-19 declaration under Section 564(b)(1) of the Act, 21 U.S.C.section 360bbb-3(b)(1), unless the authorization is terminated  or revoked sooner.       Influenza A by PCR NEGATIVE NEGATIVE Final   Influenza B by PCR NEGATIVE NEGATIVE Final    Comment: (NOTE) The Xpert Xpress SARS-CoV-2/FLU/RSV plus assay is intended as an aid in the diagnosis of influenza from Nasopharyngeal swab specimens and should not be used as a sole basis for treatment. Nasal washings and aspirates are unacceptable for Xpert Xpress SARS-CoV-2/FLU/RSV testing.  Fact Sheet for  Patients: BloggerCourse.comhttps://www.fda.gov/media/152166/download  Fact Sheet for Healthcare Providers: SeriousBroker.ithttps://www.fda.gov/media/152162/download  This test is not yet approved or cleared by the [place] FDA and has been authorized for detection and/or diagnosis of SARS-CoV-2 by FDA under an Emergency Use Authorization (EUA). This EUA will remain in effect (meaning this test can be used) for the duration of the COVID-19 declaration under Section 564(b)(1) of the Act, 21 U.S.C. section 360bbb-3(b)(1), unless the authorization is terminated or revoked.  Performed at [facility], [address] [place], [place] [address]   C Difficile Quick Screen w PCR reflex     Status: None   Collection Time: 06/17/21  9:20 AM   Specimen: STOOL  Result Value Ref Range Status   C Diff antigen NEGATIVE NEGATIVE Final   C Diff toxin NEGATIVE NEGATIVE Final   C Diff interpretation No C. difficile detected.  Final    Comment: Performed at [facility], [address] [place], [place] [address]  Gastrointestinal Panel by PCR , Stool     Status: None   Collection Time: 06/17/21  9:20 AM   Specimen: STOOL  Result Value Ref Range Status   Campylobacter species NOT DETECTED NOT DETECTED Final   Plesimonas shigelloides NOT DETECTED NOT DETECTED Final   Salmonella species NOT DETECTED NOT DETECTED Final   Yersinia enterocolitica NOT DETECTED NOT DETECTED Final   Vibrio species NOT DETECTED NOT DETECTED Final   Vibrio cholerae NOT DETECTED NOT DETECTED Final   Enteroaggregative E coli (EAEC) NOT DETECTED NOT DETECTED Final   Enteropathogenic E coli (EPEC) NOT DETECTED NOT DETECTED Final   Enterotoxigenic E coli (ETEC) NOT DETECTED NOT DETECTED Final   Shiga like toxin producing E coli (STEC) NOT DETECTED NOT DETECTED Final   Shigella/Enteroinvasive E coli (EIEC) NOT DETECTED NOT DETECTED Final   Cryptosporidium NOT DETECTED NOT DETECTED Final   Cyclospora cayetanensis NOT  DETECTED NOT DETECTED Final   Entamoeba histolytica NOT DETECTED NOT DETECTED Final   Giardia lamblia NOT DETECTED NOT DETECTED Final   Adenovirus F40/41 NOT DETECTED NOT DETECTED Final   Astrovirus NOT DETECTED NOT DETECTED Final   Norovirus GI/GII NOT DETECTED NOT DETECTED Final   Rotavirus A NOT DETECTED NOT DETECTED Final  Sapovirus (I, II, IV, and V) NOT DETECTED NOT DETECTED Final    Comment: Performed at [facility], [address] [place], [place] [address]      Radiology Studies: No results found.  Pamella Pert, MD, PhD [facility]  Between 7 am - 7 pm I am available, please contact me via Amion (for emergencies) or Securechat (non urgent messages)  Between 7 pm - 7 am I am not available, please contact night coverage MD/APP via Amion

## 2021-06-21 NOTE — Progress Notes (Signed)
Have arrived to room to find pt in HR 160's. MD on floor and aware. New orders received and noted.  EKG completed. At this time, pt is on 1 L/min O2 via Axis r/t O2 sats at 89%. Pt continues to mumble, after being fed about 30% of breakfast, that he wants to take a nap.  Chest x-ray done. Pt has order to move to Progressive unit for Cardizem drip. Have alerted [facility] to help make this move soon for Cardizem to start. Close monitoring continues.

## 2021-06-21 NOTE — Progress Notes (Signed)
  Echocardiogram 2D Echocardiogram has been performed.  Warren Salas 06/21/2021, 12:02 PM

## 2021-06-21 NOTE — Progress Notes (Signed)
Receiving nurse returned call and report was given by Clinical research associate. Patient will be transferred to Room 1407.

## 2021-06-21 NOTE — Progress Notes (Signed)
Have attempted to call report to Charge Nurse on 4th floor. No answer at this time.

## 2021-06-21 NOTE — Progress Notes (Signed)
Pt arrived to 1407 without bilateral wrist restraints applied. Wrist restraints remained off during this shift.  Order allowed to discontinue. Pt safety  maintained.

## 2021-06-21 NOTE — Progress Notes (Signed)
PT Cancellation Note  Patient Details Name: Warren Salas MRN: [id number] DOB: [birth year]   Cancelled Treatment:    Reason Eval/Treat Not Completed: Other (comment);Medical issues which prohibited therapy; pt went to telemetry/progressive care unit. RN states to defer PT today   [facility] 06/21/2021, 12:17 PM

## 2021-06-22 ENCOUNTER — Inpatient Hospital Stay (HOSPITAL_COMMUNITY): Payer: Medicare Other

## 2021-06-22 DIAGNOSIS — I2510 Atherosclerotic heart disease of native coronary artery without angina pectoris: Secondary | ICD-10-CM

## 2021-06-22 DIAGNOSIS — D696 Thrombocytopenia, unspecified: Secondary | ICD-10-CM

## 2021-06-22 DIAGNOSIS — R000 Tachycardia, unspecified: Secondary | ICD-10-CM

## 2021-06-22 LAB — GLUCOSE, CAPILLARY
Glucose-Capillary: 103 mg/dL — ABNORMAL HIGH (ref 70–99)
Glucose-Capillary: 100 mg/dL — ABNORMAL HIGH (ref 70–99)
Glucose-Capillary: 123 mg/dL — ABNORMAL HIGH (ref 70–99)
Glucose-Capillary: 85 mg/dL (ref 70–99)

## 2021-06-22 MED ORDER — METOPROLOL TARTRATE 50 MG PO TABS
50.0000 mg | ORAL_TABLET | Freq: Two times a day (BID) | ORAL | Status: DC
  Administered 2021-06-22 – 2021-06-25 (×7): 50 mg via ORAL
  Filled 2021-06-22 (×8): qty 1

## 2021-06-22 MED ORDER — ASPIRIN EC 81 MG PO TBEC
81.0000 mg | DELAYED_RELEASE_TABLET | Freq: Every day | ORAL | Status: DC
  Administered 2021-06-22 – 2021-06-25 (×4): 81 mg via ORAL
  Filled 2021-06-22 (×4): qty 1

## 2021-06-22 NOTE — Progress Notes (Signed)
PROGRESS NOTE  Warren Salas OZH:[id number] DOB: 04-May-1961 DOA: 06/16/2021 PCP: Patient, No Pcp Per (Inactive)   LOS: 5 days   Brief Narrative / Interim history: 60 year old male with alcohol abuse, homelessness currently living in a tent, COPD, DM 2, CAD, chronic back pain comes to the hospital with profuse diarrhea as well as weakness/dizziness.  He tells me his diarrhea is somewhat chronic going back a few months but in the last few days it has gotten significantly worse with more than 10 watery bowel movements per day.  He is also been extremely weak and dizzy upon standing and walking.  Subjective / 24h Interval events:  No complaints this morning, Calmer.  Rates back into the 150s  Assessment & Plan: Principal Problem EtOH abuse with significant withdrawals, confusion-continue CIWA, appears less agitated today -Given paroxysmal A. fib and persistent mental status changes and concern for dysphagia which was not present before will obtain a repeat MRI of the brain  Active Problems Paroxysmal A. fib, with RVR-new onset 6/25, has moved to progressive.  Has been flipping in and out of A. fib/flutter with rates right around 150, difficult to control unless he flips to sinus rhythm.  Had to be on Cardizem drip twice already.  Has been back into the 150s this morning but later converted back to sinus rhythm.  2D echo was unremarkable.  I have asked cards to assist with management.  Not sure if he is a good anticoagulation candidate given thrombocytopenia, ongoing EtOH abuse and homelessness  Hyponatremia-due to dehydration, sodium improved with fluids  Diarrhea-appears to be acute on chronic, C. difficile negative, continue Imodium  Dizziness-possibly related to EtOH intoxication as his levels were positive on admission.  MRI of the brain was negative for acute CVA.  He is also extremely weak possibly due to ongoing diarrhea.  Type 2 diabetes mellitus-continue sliding scale  Tobacco  use-nicotine patch  CAD-no chest pain, appears stable  History of asthma-no wheezing, this is stable  Homelessness-lives in a tent, case manager consulted.  He does not seem interested in shelters, will likely go back to his tent once withdrawals improve  Scheduled Meds:  enoxaparin (LOVENOX) injection  40 mg Subcutaneous Q24H   folic acid  1 mg Oral Daily   Gerhardt's butt cream   Topical QID   insulin aspart  0-15 Units Subcutaneous TID WC   insulin aspart  0-5 Units Subcutaneous QHS   metoprolol tartrate  25 mg Oral BID   multivitamin with minerals  1 tablet Oral Daily   nicotine  21 mg Transdermal Daily   thiamine  100 mg Oral Daily   Or   thiamine  100 mg Intravenous Daily   Continuous Infusions:  diltiazem (CARDIZEM) infusion Stopped (06/21/21 1312)    PRN Meds:.acetaminophen, guaiFENesin-dextromethorphan, haloperidol lactate, levalbuterol, loperamide, LORazepam **OR** LORazepam, ondansetron **OR** ondansetron (ZOFRAN) IV  Diet Orders (From admission, onward)     Start     Ordered   06/21/21 1849  DIET DYS 2 Room service appropriate? Yes; Fluid consistency: Thin  Diet effective now       Question Answer Comment  Room service appropriate? Yes   Fluid consistency: Thin      06/21/21 1848            DVT prophylaxis: enoxaparin (LOVENOX) injection 40 mg Start: 06/17/21 1000     Code Status: Full Code  Family Communication: No family at bedside  Status is: Inpatient   IV treatments appropriate due to intensity of illness  or inability to take PO  Dispo: The patient is from: Home              Anticipated d/c is to: Home              Patient currently is not medically stable to d/c.   Difficult to place patient No   Level of care: Progressive  Consultants:  None  Procedures:  none  Microbiology  none  Antimicrobials: none    Objective: Vitals:   06/22/21 0300 06/22/21 0454 06/22/21 0635 06/22/21 0730  BP:  134/68    Pulse: 93 92    Resp:  18 (!) 24    Temp:   98.4 F (36.9 C) 98.6 F (37 C)  TempSrc:    Axillary  SpO2: 92% 91%    Weight:      Height:        Intake/Output Summary (Last 24 hours) at 06/22/2021 1013 Last data filed at 06/22/2021 0708 Gross per 24 hour  Intake 0 ml  Output 900 ml  Net -900 ml    Filed Weights   06/16/21 1835  Weight: 64 kg    Examination:  Constitutional: No apparent distress Eyes: No scleral icterus ENMT: mmm Neck: normal, supple Respiratory: Clear, no wheezing, no crackles Cardiovascular: Tachycardic, irregular, no murmurs heard.  No edema Abdomen: Soft, NT, ND, bowel sounds positive Musculoskeletal: no clubbing / cyanosis.  Skin: No rashes seen Neurologic: No focal deficits  Data Reviewed: I have independently reviewed following labs and imaging studies   CBC: Recent Labs  Lab 06/16/21 1908 06/17/21 0509 06/18/21 0603 06/20/21 0636  WBC 7.9 6.7 6.5 6.8  NEUTROABS 4.8  --   --   --   HGB 11.9* 11.8* 11.5* 12.4*  HCT 34.0* 34.4* 33.9* 37.1*  MCV 97.4 98.6 99.4 102.2*  PLT 76* 68* 55* 67*    Basic Metabolic Panel: Recent Labs  Lab 06/16/21 1908 06/17/21 0509 06/18/21 0603 06/20/21 0636  NA 126* 132* 130* 132*  K 3.9 3.8 3.5 3.4*  CL 94* 100 98 99  CO2 23 23 24 25   GLUCOSE 88 81 84 103*  BUN <5* <5* 7 5*  CREATININE 0.48* 0.48* 0.47* 0.53*  CALCIUM 7.9* 8.0* 8.3* 8.4*  MG 1.9 1.9  --   --   PHOS  --  3.4  --   --     Liver Function Tests: Recent Labs  Lab 06/16/21 1908 06/17/21 0509 06/20/21 0636  AST 84* 86* 53*  ALT 26 25 20   ALKPHOS 154* 138* 106  BILITOT 2.0* 2.6* 3.2*  PROT 6.9 6.8 6.6  ALBUMIN 3.0* 3.0* 2.9*    Coagulation Profile: No results for input(s): INR, PROTIME in the last 168 hours. HbA1C: No results for input(s): HGBA1C in the last 72 hours.  CBG: Recent Labs  Lab 06/21/21 0731 06/21/21 1156 06/21/21 1625 06/21/21 2114 06/22/21 0740  GLUCAP 100* 97 118* 100* 103*     Recent Results (from the past 240  hour(s))  Resp Panel by RT-PCR (Flu A&B, Covid) Nasopharyngeal Swab     Status: None   Collection Time: 06/16/21 11:25 PM   Specimen: Nasopharyngeal Swab; Nasopharyngeal(NP) swabs in vial transport medium  Result Value Ref Range Status   SARS Coronavirus 2 by RT PCR NEGATIVE NEGATIVE Final    Comment: (NOTE) SARS-CoV-2 target nucleic acids are NOT DETECTED.  The SARS-CoV-2 RNA is generally detectable in upper respiratory specimens during the acute phase of infection. The lowest concentration of SARS-CoV-2  viral copies this assay can detect is 138 copies/mL. A negative result does not preclude SARS-Cov-2 infection and should not be used as the sole basis for treatment or other patient management decisions. A negative result may occur with  improper specimen collection/handling, submission of specimen other than nasopharyngeal swab, presence of viral mutation(s) within the areas targeted by this assay, and inadequate number of viral copies(<138 copies/mL). A negative result must be combined with clinical observations, patient history, and epidemiological information. The expected result is Negative.  Fact Sheet for Patients:  BloggerCourse.com  Fact Sheet for Healthcare Providers:  SeriousBroker.it  This test is no t yet approved or cleared by the [place] FDA and  has been authorized for detection and/or diagnosis of SARS-CoV-2 by FDA under an Emergency Use Authorization (EUA). This EUA will remain  in effect (meaning this test can be used) for the duration of the COVID-19 declaration under Section 564(b)(1) of the Act, 21 U.S.C.section 360bbb-3(b)(1), unless the authorization is terminated  or revoked sooner.       Influenza A by PCR NEGATIVE NEGATIVE Final   Influenza B by PCR NEGATIVE NEGATIVE Final    Comment: (NOTE) The Xpert Xpress SARS-CoV-2/FLU/RSV plus assay is intended as an aid in the diagnosis of influenza from  Nasopharyngeal swab specimens and should not be used as a sole basis for treatment. Nasal washings and aspirates are unacceptable for Xpert Xpress SARS-CoV-2/FLU/RSV testing.  Fact Sheet for Patients: BloggerCourse.com  Fact Sheet for Healthcare Providers: SeriousBroker.it  This test is not yet approved or cleared by the [place] FDA and has been authorized for detection and/or diagnosis of SARS-CoV-2 by FDA under an Emergency Use Authorization (EUA). This EUA will remain in effect (meaning this test can be used) for the duration of the COVID-19 declaration under Section 564(b)(1) of the Act, 21 U.S.C. section 360bbb-3(b)(1), unless the authorization is terminated or revoked.  Performed at [facility], [address], [place], [place] [address]   C Difficile Quick Screen w PCR reflex     Status: None   Collection Time: 06/17/21  9:20 AM   Specimen: STOOL  Result Value Ref Range Status   C Diff antigen NEGATIVE NEGATIVE Final   C Diff toxin NEGATIVE NEGATIVE Final   C Diff interpretation No C. difficile detected.  Final    Comment: Performed at [facility] [address] [place], [place] [address]  Gastrointestinal Panel by PCR , Stool     Status: None   Collection Time: 06/17/21  9:20 AM   Specimen: STOOL  Result Value Ref Range Status   Campylobacter species NOT DETECTED NOT DETECTED Final   Plesimonas shigelloides NOT DETECTED NOT DETECTED Final   Salmonella species NOT DETECTED NOT DETECTED Final   Yersinia enterocolitica NOT DETECTED NOT DETECTED Final   Vibrio species NOT DETECTED NOT DETECTED Final   Vibrio cholerae NOT DETECTED NOT DETECTED Final   Enteroaggregative E coli (EAEC) NOT DETECTED NOT DETECTED Final   Enteropathogenic E coli (EPEC) NOT DETECTED NOT DETECTED Final   Enterotoxigenic E coli (ETEC) NOT DETECTED NOT DETECTED Final   Shiga like toxin producing E coli  (STEC) NOT DETECTED NOT DETECTED Final   Shigella/Enteroinvasive E coli (EIEC) NOT DETECTED NOT DETECTED Final   Cryptosporidium NOT DETECTED NOT DETECTED Final   Cyclospora cayetanensis NOT DETECTED NOT DETECTED Final   Entamoeba histolytica NOT DETECTED NOT DETECTED Final   Giardia lamblia NOT DETECTED NOT DETECTED Final   Adenovirus F40/41 NOT  DETECTED NOT DETECTED Final   Astrovirus NOT DETECTED NOT DETECTED Final   Norovirus GI/GII NOT DETECTED NOT DETECTED Final   Rotavirus A NOT DETECTED NOT DETECTED Final   Sapovirus (I, II, IV, and V) NOT DETECTED NOT DETECTED Final    Comment: Performed at [facility], [address], [place], [place] [address]      Radiology Studies: ECHOCARDIOGRAM COMPLETE  Result Date: 06/21/2021    ECHOCARDIOGRAM REPORT   Patient Name:   Warren Salas Date of Exam: 06/21/2021 Medical Rec #:  [id number]      Height:       66.0 in Accession #:    [id number]     Weight:       141.1 lb Date of Birth:  04-05-1961      BSA:          1.724 m Patient Age:    60 years       BP:           134/75 mmHg Patient Gender: M              HR:           73 bpm. Exam Location:  Inpatient Procedure: 2D Echo, Cardiac Doppler and Color Doppler Indications:    R94.31 Abnormal EKG  History:        Patient has prior history of Echocardiogram examinations, most                 recent 11/02/2017. Previous Myocardial Infarction, COPD; Risk                 Factors:Hypertension and Diabetes.  Sonographer:    Elmarie Shiley Dance Referring Phys: [id number] Daylene Katayama Lataya Varnell IMPRESSIONS  1. Left ventricular ejection fraction, by estimation, is 60 to 65%. The left ventricle has normal function. The left ventricle has no regional wall motion abnormalities. Left ventricular diastolic parameters were normal.  2. Right ventricular systolic function is normal. The right ventricular size is normal. Tricuspid regurgitation signal is inadequate for assessing PA pressure.  3. The mitral valve is normal in  structure. Trivial mitral valve regurgitation. No evidence of mitral stenosis.  4. The aortic valve is tricuspid. Aortic valve regurgitation is not visualized. Mild to moderate aortic valve sclerosis/calcification is present, without any evidence of aortic stenosis.  5. The inferior vena cava is normal in size with greater than 50% respiratory variability, suggesting right atrial pressure of 3 mmHg. FINDINGS  Left Ventricle: Left ventricular ejection fraction, by estimation, is 60 to 65%. The left ventricle has normal function. The left ventricle has no regional wall motion abnormalities. The left ventricular internal cavity size was normal in size. There is  no left ventricular hypertrophy. Left ventricular diastolic parameters were normal. Right Ventricle: The right ventricular size is normal. No increase in right ventricular wall thickness. Right ventricular systolic function is normal. Tricuspid regurgitation signal is inadequate for assessing PA pressure. Left Atrium: Left atrial size was normal in size. Right Atrium: Right atrial size was normal in size. Pericardium: There is no evidence of pericardial effusion. Mitral Valve: The mitral valve is normal in structure. Trivial mitral valve regurgitation. No evidence of mitral valve stenosis. Tricuspid Valve: The tricuspid valve is normal in structure. Tricuspid valve regurgitation is trivial. Aortic Valve: The aortic valve is tricuspid. Aortic valve regurgitation is not visualized. Mild to moderate aortic valve sclerosis/calcification is present, without any evidence of aortic stenosis. Pulmonic Valve: The pulmonic valve was not well visualized. Pulmonic valve  regurgitation is not visualized. Aorta: The aortic root and ascending aorta are structurally normal, with no evidence of dilitation. Venous: The inferior vena cava is normal in size with greater than 50% respiratory variability, suggesting right atrial pressure of 3 mmHg. IAS/Shunts: The interatrial septum  was not well visualized.  LEFT VENTRICLE PLAX 2D LVIDd:         4.30 cm  Diastology LVIDs:         3.90 cm  LV e' medial:    8.49 cm/s LV PW:         1.00 cm  LV E/e' medial:  13.4 LV IVS:        1.00 cm  LV e' lateral:   10.30 cm/s LVOT diam:     2.00 cm  LV E/e' lateral: 11.1 LV SV:         66 LV SV Index:   38 LVOT Area:     3.14 cm  RIGHT VENTRICLE             IVC RV Basal diam:  2.70 cm     IVC diam: 1.90 cm RV S prime:     10.00 cm/s TAPSE (M-mode): 1.8 cm LEFT ATRIUM             Index       RIGHT ATRIUM           Index LA diam:        4.10 cm 2.38 cm/m  RA Area:     14.00 cm LA Vol (A2C):   57.4 ml 33.29 ml/m RA Volume:   32.20 ml  18.68 ml/m LA Vol (A4C):   53.2 ml 30.85 ml/m LA Biplane Vol: 55.2 ml 32.01 ml/m  AORTIC VALVE LVOT Vmax:   105.00 cm/s LVOT Vmean:  65.500 cm/s LVOT VTI:    0.211 m  AORTA Ao Root diam: 3.10 cm Ao Asc diam:  3.15 cm MITRAL VALVE MV Area (PHT): 3.85 cm     SHUNTS MV Decel Time: 197 msec     Systemic VTI:  0.21 m MV E velocity: 114.00 cm/s  Systemic Diam: 2.00 cm MV A velocity: 96.70 cm/s MV E/A ratio:  1.18 Epifanio Lesches MD Electronically signed by Epifanio Lesches MD Signature Date/Time: 06/21/2021/1:17:29 PM    Final     Pamella Pert, MD, PhD [facility]  Between 7 am - 7 pm I am available, please contact me via Amion (for emergencies) or Securechat (non urgent messages)  Between 7 pm - 7 am I am not available, please contact night coverage MD/APP via Amion

## 2021-06-22 NOTE — Evaluation (Signed)
Clinical/Bedside Swallow Evaluation Patient Details  Name: Warren Salas MRN: [id number] Date of Birth: October 30, 1961  Today's Date: 06/22/2021 Time: SLP Start Time (ACUTE ONLY): 1210 SLP Stop Time (ACUTE ONLY): 1225 SLP Time Calculation (min) (ACUTE ONLY): 15 min  Past Medical History:  Past Medical History:  Diagnosis Date   Back pain    COPD (chronic obstructive pulmonary disease) (HCC)    Diabetes mellitus without complication (HCC)    Hypertension    STEMI (ST elevation myocardial infarction) (HCC) 10/2017   Past Surgical History:  Past Surgical History:  Procedure Laterality Date   APPENDECTOMY     CORONARY STENT INTERVENTION N/A 12/29/2017   Procedure: CORONARY STENT INTERVENTION - LAD;  Surgeon: Corky Crafts, MD;  Location: [facility] INVASIVE CV LAB;  Service: Cardiovascular;  Laterality: N/A;   CORONARY/GRAFT ACUTE MI REVASCULARIZATION N/A 11/01/2017   Procedure: Coronary/Graft Acute MI Revascularization;  Surgeon: Corky Crafts, MD;  Location: [facility] INVASIVE CV LAB;  Service: Cardiovascular;  Laterality: N/A;   LEFT HEART CATH AND CORONARY ANGIOGRAPHY N/A 11/01/2017   Procedure: LEFT HEART CATH AND CORONARY ANGIOGRAPHY;  Surgeon: Corky Crafts, MD;  Location[facility] INVASIVE CV LAB;  Service: Cardiovascular;  Laterality: N/A;   HPI:  Patient is a 60 year old male comes to the hospital with profuse diarrhea as well as weakness/dizziness.  Past medical history of alcohol abuse, homelessness currently living in a tent, COPD, DM 2, CAD, chronic back pain. RN observed coughing with liquids and SLP was ordered to evaluate swallow function.   Assessment / Plan / Recommendation Clinical Impression  Patient presents with a mild-moderate oropharyngeal dysphagia with immediate and delayed coughing following sips of thin liquids but no overt s/s aspiration or penetration with nectar thick liquids. Patient continues with confusion, decreased alertness. He declined puree solids and SLP  did not text regular solids as patient started to become more lethargic and sleepy. SLP recommending continue with Dys 2 solids and downgrade liquids to nectar thick consistency.      Aspiration Risk  Mild aspiration risk    Diet Recommendation Dysphagia 2 (Fine chop);Nectar-thick liquid   Liquid Administration via: Cup;Straw Medication Administration: Crushed with puree Supervision: Full supervision/cueing for compensatory strategies;Staff to assist with self feeding Compensations: Minimize environmental distractions;Small sips/bites;Slow rate Postural Changes: Seated upright at 90 degrees    Other  Recommendations Oral Care Recommendations: Oral care BID;Staff/trained caregiver to provide oral care   Follow up Recommendations Other (comment) (TBD)      Frequency and Duration min 2x/week  1 week       Prognosis Prognosis for Safe Diet Advancement: Good      Swallow Study   General Date of Onset: 06/17/21 HPI: Patient is a 60 year old male comes to the hospital with profuse diarrhea as well as weakness/dizziness.  Past medical history of alcohol abuse, homelessness currently living in a tent, COPD, DM 2, CAD, chronic back pain. RN observed coughing with liquids and SLP was ordered to evaluate swallow function. Type of Study: Bedside Swallow Evaluation Previous Swallow Assessment: None found Diet Prior to this Study: Dysphagia 2 (chopped);Thin liquids Temperature Spikes Noted: No Respiratory Status: Room air History of Recent Intubation: No Behavior/Cognition: Confused;Impulsive;Lethargic/Drowsy;Distractible;Requires cueing Oral Cavity Assessment: Dry Oral Care Completed by SLP: Yes Oral Cavity - Dentition: Edentulous Self-Feeding Abilities: Total assist Patient Positioning: Upright in bed Baseline Vocal Quality: Other (comment) (voice strained, speech very difficult to understand) Volitional Cough: Cognitively unable to elicit Volitional Swallow: Unable to elicit     Oral/Motor/Sensory Function Overall  Oral Motor/Sensory Function: Other (comment) (patient unable to participate in full assessment but appears WFL)   Ice Chips     Thin Liquid Thin Liquid: Impaired Presentation: Straw;Cup Oral Phase Impairments: Poor awareness of bolus Pharyngeal  Phase Impairments: Cough - Delayed;Cough - Immediate    Nectar Thick Nectar Thick Liquid: Impaired Presentation: Straw Oral Phase Impairments: Poor awareness of bolus   Honey Thick     Puree Puree: Not tested Other Comments: patient declined   Solid     Solid: Not tested      Angela Nevin, MA, CCC-SLP Speech Therapy

## 2021-06-22 NOTE — Care Management Important Message (Signed)
Important Message  Patient Details IM Letter given to the Patient. Name: Warren Salas MRN: [id number] Date of Birth: 1961-10-13   Medicare Important Message Given:  Yes     Caren Macadam 06/22/2021, 3:02 PM

## 2021-06-22 NOTE — Progress Notes (Signed)
PT Cancellation Note  Patient Details Name: Warren Salas MRN: [id number] DOB: [birth year]   Cancelled Treatment:    Reason Eval/Treat Not Completed: Medical issues which prohibited therapy (received Ativan, hold therapy today per RN)   Maida Sale E 06/22/2021, 9:51 AM Paulino Door, DPT Acute Rehabilitation Services Pager: [phone] Office: [phone]

## 2021-06-22 NOTE — Consult Note (Signed)
Cardiology Consultation:   Patient ID: Warren Salas MRN: [id number]; DOB: 10/06/1961  Admit date: 06/16/2021 Date of Consult: 06/22/2021  PCP:  Patient, No Pcp Per (Inactive)   [facility] Providers Cardiologist:  None        Patient Profile:   Warren Salas is a 60 y.o. male with a hx of lcohol abuse, tobacco use, COPD, CAD status post PCI to RCA and LAD, T2DM who is being seen 06/22/2021 for the evaluation of tachycardia at the request of Dr Elvera LennoxGherghe.  History of Present Illness:   Warren Salas is a 60 year old male with a history of alcohol abuse, tobacco use, COPD, CAD status post PCI to RCA and LAD, T2DM who presented with diarrhea and weakness.  Developed possible A. fib with RVR yesterday.  He was started on oral metoprolol and converted to sinus rhythm.  Has continued to have intermittent episodes of tachycardia.  Most recent vital signs show BP 134/68, pulse 92, SPO2 91% on room air.  Labs show creatinine 0.53, potassium 3.4, WBC 6.8, hemoglobin 12.4, platelets 67.  He does have a history of CAD with STEMI in 10/2017.  Cath showed occluded RCA, treated with DES.  Also had 75% mid LAD and 25% LCx stenosis at that time.  Echocardiogram done showed EF 60 to 65%.  He was started on DAPT with aspirin and Brilinta.  Subsequently developed further angina and underwent PCI to LAD in January 2019.  Echocardiogram on 6/26 showed normal biventricular function, normal diastolic function, no significant valvular disease.  He is currently very somnolent, arousable but not answering questions   Past Medical History:  Diagnosis Date   Back pain    COPD (chronic obstructive pulmonary disease) (HCC)    Diabetes mellitus without complication (HCC)    Hypertension    STEMI (ST elevation myocardial infarction) (HCC) 10/2017    Past Surgical History:  Procedure Laterality Date   APPENDECTOMY     CORONARY STENT INTERVENTION N/A 12/29/2017   Procedure: CORONARY STENT INTERVENTION - LAD;   Surgeon: Corky CraftsVaranasi, Jayadeep S, MD;  Location: [facility]AS[facility];  Service: Cardiovascular;  Laterality: N/A;   CORONARY/GRAFT ACUTE MI REVASCULARIZATION N/A 11/01/2017   Procedure: Coronary/Graft Acute MI Revascularization;  Surgeon: Corky CraftsVaranasi, Jayadeep S, MD;  [facility];  Service: Cardiovascular;  Laterality: N/A;   LEFT HEART CATH AND CORONARY ANGIOGRAPHY N/A 11/01/2017   Procedure: LEFT HEART CATH AND CORONARY ANGIOGRAPHY;  Surgeon: Corky CraftsVaranasi, Jayadeep S, MD;  [facility];  Service: Cardiovascular;  Laterality: N/A;       Inpatient Medications: Scheduled Meds:  enoxaparin (LOVENOX) injection  40 mg Subcutaneous Q24H   folic acid  1 mg Oral Daily   Gerhardt's butt cream   Topical QID   insulin aspart  0-15 Units Subcutaneous TID WC   insulin aspart  0-5 Units Subcutaneous QHS   metoprolol tartrate  25 mg Oral BID   multivitamin with minerals  1 tablet Oral Daily   nicotine  21 mg Transdermal Daily   thiamine  100 mg Oral Daily   Or   thiamine  100 mg Intravenous Daily   Continuous Infusions:  diltiazem (CARDIZEM) infusion Stopped (06/21/21 1312)   PRN Meds: acetaminophen, guaiFENesin-dextromethorphan, haloperidol lactate, levalbuterol, loperamide, LORazepam **OR** LORazepam, ondansetron **OR** ondansetron (ZOFRAN) IV  Allergies:   No Known Allergies  Social History:   Social History   Socioeconomic History   Marital status: Divorced    Spouse name: Not on file   Number of  children: 3   Years of education: Not on file   Highest education level: Not on file  Occupational History   Not on file  Tobacco Use   Smoking status: Every Day    Packs/day: 1.00    Pack years: 0.00    Types: Cigarettes   Smokeless tobacco: Never  Vaping Use   Vaping Use: Never used  Substance and Sexual Activity   Alcohol use: Yes    Alcohol/week: 12.0 standard drinks    Types: 12 Cans of beer per week   Drug use: Yes    Types: Cocaine    Comment: no  current use   Sexual activity: Never  Other Topics Concern   Not on file  Social History Narrative   Not on file   Social Determinants of Health   Financial Resource Strain: Not on file  Food Insecurity: Not on file  Transportation Needs: Not on file  Physical Activity: Not on file  Stress: Not on file  Social Connections: Not on file  Intimate Partner Violence: Not on file    Family History:    Family History  Problem Relation Age of Onset   Hypertension Mother    Diabetes Mother    Hypertension Father    Diabetes Father      ROS:  Please see the history of present illness. Unable to complete as patient not answering questions.  Physical Exam/Data:   Vitals:   06/22/21 0300 06/22/21 0454 06/22/21 0635 06/22/21 0730  BP:  134/68    Pulse: 93 92    Resp: 18 (!) 24    Temp:   98.4 F (36.9 C) 98.6 F (37 C)  TempSrc:    Axillary  SpO2: 92% 91%    Weight:      Height:        Intake/Output Summary (Last 24 hours) at 06/22/2021 1321 Last data filed at 06/22/2021 0708 Gross per 24 hour  Intake 0 ml  Output 900 ml  Net -900 ml   Last 3 Weights 06/16/2021 06/14/2021 06/04/2021  Weight (lbs) 141 lb 1.5 oz 139 lb 15.9 oz 139 lb 15.9 oz  Weight (kg) 64 kg 63.5 kg 63.5 kg  Some encounter information is confidential and restricted. Go to Review Flowsheets activity to see all data.     Body mass index is 22.77 kg/m.  General:  Somnolent, arousable  HEENT: normal Neck: no JVD Cardiac:  normal S1, S2; RRR; no murmur  Lungs:  clear to auscultation bilaterally, no wheezing, rhonchi or rales  Abd: soft, nontender Ext: no edema Musculoskeletal:  No deformities, BUE and BLE strength normal and equal Skin: warm and dry  Neuro:  Not following commands or answering questions Psych:  Unable to assess  EKG:  The EKG was personally reviewed and demonstrates:  narrow complex tachycardia, rate 147 Telemetry:  Telemetry was personally reviewed and demonstrates:  Currently in  sinus rhythm in 70-80s but having intermittent episodes (2 hour episode today) of narrow complex regular tachycardia up to rate 160  Relevant CV Studies:   Laboratory Data:  High Sensitivity Troponin:  No results for input(s): TROPONINIHS in the last 720 hours.   Chemistry Recent Labs  Lab 06/17/21 0509 06/18/21 0603 06/20/21 0636  NA 132* 130* 132*  K 3.8 3.5 3.4*  CL 100 98 99  CO2 23 24 25   GLUCOSE 81 84 103*  BUN <5* 7 5*  CREATININE 0.48* 0.47* 0.53*  CALCIUM 8.0* 8.3* 8.4*  GFRNONAA >60 >60 >60  ANIONGAP 9 8 8     Recent Labs  Lab 06/16/21 1908 06/17/21 0509 06/20/21 0636  PROT 6.9 6.8 6.6  ALBUMIN 3.0* 3.0* 2.9*  AST 84* 86* 53*  ALT 26 25 20   ALKPHOS 154* 138* 106  BILITOT 2.0* 2.6* 3.2*   Hematology Recent Labs  Lab 06/17/21 0509 06/18/21 0603 06/20/21 0636  WBC 6.7 6.5 6.8  RBC 3.49* 3.41* 3.63*  HGB 11.8* 11.5* 12.4*  HCT 34.4* 33.9* 37.1*  MCV 98.6 99.4 102.2*  MCH 33.8 33.7 34.2*  MCHC 34.3 33.9 33.4  RDW 18.6* 18.6* 18.5*  PLT 68* 55* 67*   BNPNo results for input(s): BNP, PROBNP in the last 168 hours.  DDimer No results for input(s): DDIMER in the last 168 hours.   Radiology/Studies:  MR BRAIN WO CONTRAST  Result Date: 06/22/2021 CLINICAL DATA:  Mental status change, unknown cause. EXAM: MRI HEAD WITHOUT CONTRAST TECHNIQUE: Multiplanar, multiecho pulse sequences of the brain and surrounding structures were obtained without intravenous contrast. COMPARISON:  MRI of the brain June 17, 2021 FINDINGS: The study is degraded by motion and incomplete. Brain: No acute infarction, large hemorrhage, hydrocephalus, extra-axial collection or mass lesion. Moderate parenchymal volume loss. Vascular: Normal flow voids. Skull and upper cervical spine: Normal marrow signal. Sinuses/Orbits: Interval improvement of maxillary sinusitis. The orbits are maintained. IMPRESSION: 1. Incomplete study, degraded by motion. 2. No acute intracranial abnormality identified.  Electronically Signed   By: 06/24/2021 M.D.   On: 06/22/2021 11:38   DG CHEST PORT 1 VIEW  Result Date: 06/21/2021 CLINICAL DATA:  Dyspnea. EXAM: PORTABLE CHEST 1 VIEW COMPARISON:  06/16/2021 FINDINGS: Cardiac silhouette is normal in size and configuration. No mediastinal or hilar masses. Mild prominence of the bronchovascular markings. Lungs otherwise clear. No convincing pleural effusion or pneumothorax. Skeletal structures are grossly intact. IMPRESSION: No active disease. Electronically Signed   By: 06/23/2021 M.D.   On: 06/21/2021 10:50   ECHOCARDIOGRAM COMPLETE  Result Date: 06/21/2021    ECHOCARDIOGRAM REPORT   Patient Name:   Warren Salas Date of Exam: 06/21/2021 Medical Rec #:  Warren Scarlet      Height:       66.0 in Accession #:    06/23/2021     Weight:       141.1 lb Date of Birth:  1961/07/31      BSA:          1.724 m Patient Age:    60 years       BP:           134/75 mmHg Patient Gender: M              HR:           73 bpm. Exam Location:  Inpatient Procedure: 2D Echo, Cardiac Doppler and Color Doppler Indications:    R94.31 Abnormal EKG  History:        Patient has prior history of Echocardiogram examinations, most                 recent 11/02/2017. Previous Myocardial Infarction, COPD; Risk                 Factors:Hypertension and Diabetes.  Sonographer:    05/18/1961 Dance Referring Phys: 13/06/2017 Elmarie Shiley GHERGHE IMPRESSIONS  1. Left ventricular ejection fraction, by estimation, is 60 to 65%. The left ventricle has normal function. The left ventricle has no regional wall motion abnormalities. Left ventricular diastolic parameters were normal.  2. Right  ventricular systolic function is normal. The right ventricular size is normal. Tricuspid regurgitation signal is inadequate for assessing PA pressure.  3. The mitral valve is normal in structure. Trivial mitral valve regurgitation. No evidence of mitral stenosis.  4. The aortic valve is tricuspid. Aortic valve regurgitation is  not visualized. Mild to moderate aortic valve sclerosis/calcification is present, without any evidence of aortic stenosis.  5. The inferior vena cava is normal in size with greater than 50% respiratory variability, suggesting right atrial pressure of 3 mmHg. FINDINGS  Left Ventricle: Left ventricular ejection fraction, by estimation, is 60 to 65%. The left ventricle has normal function. The left ventricle has no regional wall motion abnormalities. The left ventricular internal cavity size was normal in size. There is  no left ventricular hypertrophy. Left ventricular diastolic parameters were normal. Right Ventricle: The right ventricular size is normal. No increase in right ventricular wall thickness. Right ventricular systolic function is normal. Tricuspid regurgitation signal is inadequate for assessing PA pressure. Left Atrium: Left atrial size was normal in size. Right Atrium: Right atrial size was normal in size. Pericardium: There is no evidence of pericardial effusion. Mitral Valve: The mitral valve is normal in structure. Trivial mitral valve regurgitation. No evidence of mitral valve stenosis. Tricuspid Valve: The tricuspid valve is normal in structure. Tricuspid valve regurgitation is trivial. Aortic Valve: The aortic valve is tricuspid. Aortic valve regurgitation is not visualized. Mild to moderate aortic valve sclerosis/calcification is present, without any evidence of aortic stenosis. Pulmonic Valve: The pulmonic valve was not well visualized. Pulmonic valve regurgitation is not visualized. Aorta: The aortic root and ascending aorta are structurally normal, with no evidence of dilitation. Venous: The inferior vena cava is normal in size with greater than 50% respiratory variability, suggesting right atrial pressure of 3 mmHg. IAS/Shunts: The interatrial septum was not well visualized.  LEFT VENTRICLE PLAX 2D LVIDd:         4.30 cm  Diastology LVIDs:         3.90 cm  LV e' medial:    8.49 cm/s LV PW:          1.00 cm  LV E/e' medial:  13.4 LV IVS:        1.00 cm  LV e' lateral:   10.30 cm/s LVOT diam:     2.00 cm  LV E/e' lateral: 11.1 LV SV:         66 LV SV Index:   38 LVOT Area:     3.14 cm  RIGHT VENTRICLE             IVC RV Basal diam:  2.70 cm     IVC diam: 1.90 cm RV S prime:     10.00 cm/s TAPSE (M-mode): 1.8 cm LEFT ATRIUM             Index       RIGHT ATRIUM           Index LA diam:        4.10 cm 2.38 cm/m  RA Area:     14.00 cm LA Vol (A2C):   57.4 ml 33.29 ml/m RA Volume:   32.20 ml  18.68 ml/m LA Vol (A4C):   53.2 ml 30.85 ml/m LA Biplane Vol: 55.2 ml 32.01 ml/m  AORTIC VALVE LVOT Vmax:   105.00 cm/s LVOT Vmean:  65.500 cm/s LVOT VTI:    0.211 m  AORTA Ao Root diam: 3.10 cm Ao Asc diam:  3.15 cm MITRAL VALVE  MV Area (PHT): 3.85 cm     SHUNTS MV Decel Time: 197 msec     Systemic VTI:  0.21 m MV E velocity: 114.00 cm/s  Systemic Diam: 2.00 cm MV A velocity: 96.70 cm/s MV E/A ratio:  1.18 Epifanio Lesches MD Electronically signed by Epifanio Lesches MD Signature Date/Time: 06/21/2021/1:17:29 PM    Final      Assessment and Plan:   Tachycardia: having episodes of regular narrow complex tachycardia, SVT vs Aflutter.  I do not see clear flutter waves, but rate is typically around 150 suggesting could be a 2:1 flutter.  Echo 6/26 showed normal biventricular function, no significant valvular disease. -Would recommend trying adenosine administration during next episode.  If in SVT, should break rhythm.  If aflutter, should be able to see flutter waves -Continue metoprolol, will increase to 50 mg twice daily -Would hold off on anticoagulation until confirm aflutter as not a trivial decision for him given his thrombocytopenia and social issues.  If Aflutter confirmed, recommend starting Eliquis 5 mg twice daily (and discontinuing aspirin as below)  Thrombocytopenia: Platelets 67 today.  Appears to be chronic issue, could be related to alcohol abuse.  Given considering starting  anticoagulation as above, would consider hematology evaluation  CAD: Presented with STEMI in 10/2017 status post DES to RCA.  Underwent DES to LAD in 12/2017.  Not currently on antiplatelet.  Recommend starting aspirin 81 mg daily.  If confirm Aflutter as above, would switch from ASA to Eliquis 5 mg BID.     For questions or updates, please contact [facility] Please consult [email or web address] for contact info under    Signed, Little Ishikawa, MD  06/22/2021 1:21 PM

## 2021-06-23 DIAGNOSIS — D696 Thrombocytopenia, unspecified: Secondary | ICD-10-CM

## 2021-06-23 DIAGNOSIS — R000 Tachycardia, unspecified: Secondary | ICD-10-CM

## 2021-06-23 LAB — COMPREHENSIVE METABOLIC PANEL
ALT: 17 U/L (ref 0–44)
AST: 41 U/L (ref 15–41)
Albumin: 3 g/dL — ABNORMAL LOW (ref 3.5–5.0)
Alkaline Phosphatase: 88 U/L (ref 38–126)
Anion gap: 9 (ref 5–15)
BUN: 7 mg/dL (ref 6–20)
CO2: 24 mmol/L (ref 22–32)
Calcium: 8.4 mg/dL — ABNORMAL LOW (ref 8.9–10.3)
Chloride: 102 mmol/L (ref 98–111)
Creatinine, Ser: 0.55 mg/dL — ABNORMAL LOW (ref 0.61–1.24)
GFR, Estimated: >60 mL/min (ref >60)
Glucose, Bld: 83 mg/dL (ref 70–99)
Potassium: 3.5 mmol/L (ref 3.5–5.1)
Sodium: 135 mmol/L (ref 135–145)
Total Bilirubin: 3.2 mg/dL — ABNORMAL HIGH (ref 0.3–1.2)
Total Protein: 6.8 g/dL (ref 6.5–8.1)

## 2021-06-23 LAB — GLUCOSE, CAPILLARY
Glucose-Capillary: 68 mg/dL — ABNORMAL LOW (ref 70–99)
Glucose-Capillary: 134 mg/dL — ABNORMAL HIGH (ref 70–99)
Glucose-Capillary: 91 mg/dL (ref 70–99)
Glucose-Capillary: 75 mg/dL (ref 70–99)
Glucose-Capillary: 91 mg/dL (ref 70–99)

## 2021-06-23 LAB — CBC
HCT: 37.6 % — ABNORMAL LOW (ref 39.0–52.0)
Hemoglobin: 12.6 g/dL — ABNORMAL LOW (ref 13.0–17.0)
MCH: 34.3 pg — ABNORMAL HIGH (ref 26.0–34.0)
MCHC: 33.5 g/dL (ref 30.0–36.0)
MCV: 102.5 fL — ABNORMAL HIGH (ref 80.0–100.0)
Platelets: 82 10*3/uL — ABNORMAL LOW (ref 150–400)
RBC: 3.67 MIL/uL — ABNORMAL LOW (ref 4.22–5.81)
RDW: 18.5 % — ABNORMAL HIGH (ref 11.5–15.5)
WBC: 6.5 10*3/uL (ref 4.0–10.5)
nRBC: 0 % (ref 0.0–0.2)

## 2021-06-23 MED ORDER — DEXTROSE 50 % IV SOLN
INTRAVENOUS | Status: AC
  Administered 2021-06-23: 25 mL
  Filled 2021-06-23: qty 50

## 2021-06-23 MED ORDER — THIAMINE HCL 100 MG/ML IJ SOLN
500.0000 mg | Freq: Three times a day (TID) | INTRAVENOUS | Status: DC
  Administered 2021-06-23 – 2021-06-25 (×6): 500 mg via INTRAVENOUS
  Filled 2021-06-23 (×7): qty 5

## 2021-06-23 NOTE — Progress Notes (Addendum)
PROGRESS NOTE  Warren Salas UKG:[id number] DOB: February 08, 1961 DOA: 06/16/2021 PCP: Patient, No Pcp Per (Inactive)   LOS: 6 days   Brief Narrative / Interim history: 60 year old male with alcohol abuse, homelessness currently living in a tent, COPD, DM 2, CAD, chronic back pain comes to the hospital with profuse diarrhea as well as weakness/dizziness.  He tells me his diarrhea is somewhat chronic going back a few months but in the last few days it has gotten significantly worse with more than 10 watery bowel movements per day.  He is also been extremely weak and dizzy upon standing and walking.  Subjective / 24h Interval events:  Lethargic  Assessment & Plan: Principal Problem EtOH abuse with significant withdrawals, confusion-continue CIWA, still triggering significantly, -MRI of the brain unremarkable -Withdrawals should start improving today/tomorrow, will try to minimize Ativan see if he wakes up more and is less confused -check B1 and give high dose thiamine  Active Problems Paroxysmal A. fib, with RVR versus a flutter 2-1 conduction versus SVT-cardiology consulted, he flips from sinus rhythm to rates in the 150s but is not clear with rates a flutter with 2-1 conduction versus SVT.  Cardiology recommends adenosine during an episode like this to see if the flutter waves become apparent  Hyponatremia-due to dehydration, sodium now normalized with fluids  Diarrhea-appears to be acute on chronic, C. difficile negative, continue Imodium  Dizziness-possibly related to EtOH intoxication as his levels were positive on admission.  MRI of the brain was negative for acute CVA.  He is also extremely weak possibly due to ongoing diarrhea.  PT consult  Type 2 diabetes mellitus-continue sliding scale  Tobacco use-nicotine patch  CAD-no chest pain, appears stable  History of asthma-no wheezing, this is stable  Homelessness-lives in a tent, case manager consulted.  He does not seem interested  in shelters, will likely go back to his tent once withdrawals improve  Scheduled Meds:  aspirin EC  81 mg Oral Daily   enoxaparin (LOVENOX) injection  40 mg Subcutaneous Q24H   folic acid  1 mg Oral Daily   Gerhardt's butt cream   Topical QID   insulin aspart  0-15 Units Subcutaneous TID WC   insulin aspart  0-5 Units Subcutaneous QHS   metoprolol tartrate  50 mg Oral BID   multivitamin with minerals  1 tablet Oral Daily   nicotine  21 mg Transdermal Daily   thiamine  100 mg Oral Daily   Or   thiamine  100 mg Intravenous Daily   Continuous Infusions:  diltiazem (CARDIZEM) infusion Stopped (06/21/21 1312)    PRN Meds:.acetaminophen, guaiFENesin-dextromethorphan, haloperidol lactate, levalbuterol, loperamide, ondansetron **OR** ondansetron (ZOFRAN) IV  Diet Orders (From admission, onward)     Start     Ordered   06/22/21 1220  DIET DYS 2 Room service appropriate? Yes; Fluid consistency: Nectar Thick  Diet effective now       Question Answer Comment  Room service appropriate? Yes   Fluid consistency: Nectar Thick      06/22/21 1219            DVT prophylaxis: enoxaparin (LOVENOX) injection 40 mg Start: 06/17/21 1000     Code Status: Full Code  Family Communication: No family at bedside  Status is: Inpatient   IV treatments appropriate due to intensity of illness or inability to take PO  Dispo: The patient is from: Home              Anticipated d/c is to: Home  Patient currently is not medically stable to d/c.   Difficult to place patient No   Level of care: Progressive  Consultants:  None  Procedures:  none  Microbiology  none  Antimicrobials: none    Objective: Vitals:   06/22/21 0454 06/22/21 0635 06/22/21 0730 06/22/21 1400  BP: 134/68   137/75  Pulse: 92   85  Resp: (!) 24   20  Temp:  98.4 F (36.9 C) 98.6 F (37 C) 99.1 F (37.3 C)  TempSrc:   Axillary Axillary  SpO2: 91%   93%  Weight:      Height:         Intake/Output Summary (Last 24 hours) at 06/23/2021 1001 Last data filed at 06/23/2021 0500 Gross per 24 hour  Intake 0 ml  Output 700 ml  Net -700 ml    Filed Weights   06/16/21 1835  Weight: 64 kg    Examination:  Constitutional: Lethargic Eyes: No scleral icterus ENMT: Moist mucous membranes Neck: normal, supple Respiratory: Clear bilaterally, no wheezing Cardiovascular: Regular rate and rhythm, no murmurs, no peripheral edema Abdomen: Soft, nontender, nondistended, bowel sounds positive Musculoskeletal: no clubbing / cyanosis.  Skin: No rashes seen Neurologic: Lethargic, does not follow commands but spontaneously moves all 4 extremities independently  Data Reviewed: I have independently reviewed following labs and imaging studies   CBC: Recent Labs  Lab 06/16/21 1908 06/17/21 0509 06/18/21 0603 06/20/21 0636 06/23/21 0414  WBC 7.9 6.7 6.5 6.8 6.5  NEUTROABS 4.8  --   --   --   --   HGB 11.9* 11.8* 11.5* 12.4* 12.6*  HCT 34.0* 34.4* 33.9* 37.1* 37.6*  MCV 97.4 98.6 99.4 102.2* 102.5*  PLT 76* 68* 55* 67* 82*    Basic Metabolic Panel: Recent Labs  Lab 06/16/21 1908 06/17/21 0509 06/18/21 0603 06/20/21 0636 06/23/21 0414  NA 126* 132* 130* 132* 135  K 3.9 3.8 3.5 3.4* 3.5  CL 94* 100 98 99 102  CO2 23 23 24 25 24   GLUCOSE 88 81 84 103* 83  BUN <5* <5* 7 5* 7  CREATININE 0.48* 0.48* 0.47* 0.53* 0.55*  CALCIUM 7.9* 8.0* 8.3* 8.4* 8.4*  MG 1.9 1.9  --   --   --   PHOS  --  3.4  --   --   --     Liver Function Tests: Recent Labs  Lab 06/16/21 1908 06/17/21 0509 06/20/21 0636 06/23/21 0414  AST 84* 86* 53* 41  ALT 26 25 20 17   ALKPHOS 154* 138* 106 88  BILITOT 2.0* 2.6* 3.2* 3.2*  PROT 6.9 6.8 6.6 6.8  ALBUMIN 3.0* 3.0* 2.9* 3.0*    Coagulation Profile: No results for input(s): INR, PROTIME in the last 168 hours. HbA1C: No results for input(s): HGBA1C in the last 72 hours.  CBG: Recent Labs  Lab 06/22/21 1142 06/22/21 1700  06/22/21 2017 06/23/21 0811 06/23/21 0908  GLUCAP 100* 123* 85 68* 134*     Recent Results (from the past 240 hour(s))  Resp Panel by RT-PCR (Flu A&B, Covid) Nasopharyngeal Swab     Status: None   Collection Time: 06/16/21 11:25 PM   Specimen: Nasopharyngeal Swab; Nasopharyngeal(NP) swabs in vial transport medium  Result Value Ref Range Status   SARS Coronavirus 2 by RT PCR NEGATIVE NEGATIVE Final    Comment: (NOTE) SARS-CoV-2 target nucleic acids are NOT DETECTED.  The SARS-CoV-2 RNA is generally detectable in upper respiratory specimens during the acute phase of infection. The lowest  concentration of SARS-CoV-2 viral copies this assay can detect is 138 copies/mL. A negative result does not preclude SARS-Cov-2 infection and should not be used as the sole basis for treatment or other patient management decisions. A negative result may occur with  improper specimen collection/handling, submission of specimen other than nasopharyngeal swab, presence of viral mutation(s) within the areas targeted by this assay, and inadequate number of viral copies(<138 copies/mL). A negative result must be combined with clinical observations, patient history, and epidemiological information. The expected result is Negative.  Fact Sheet for Patients:  BloggerCourse.com  Fact Sheet for Healthcare Providers:  SeriousBroker.it  This test is no t yet approved or cleared by the [place] FDA and  has been authorized for detection and/or diagnosis of SARS-CoV-2 by FDA under an Emergency Use Authorization (EUA). This EUA will remain  in effect (meaning this test can be used) for the duration of the COVID-19 declaration under Section 564(b)(1) of the Act, 21 U.S.C.section 360bbb-3(b)(1), unless the authorization is terminated  or revoked sooner.       Influenza A by PCR NEGATIVE NEGATIVE Final   Influenza B by PCR NEGATIVE NEGATIVE Final     Comment: (NOTE) The Xpert Xpress SARS-CoV-2/FLU/RSV plus assay is intended as an aid in the diagnosis of influenza from Nasopharyngeal swab specimens and should not be used as a sole basis for treatment. Nasal washings and aspirates are unacceptable for Xpert Xpress SARS-CoV-2/FLU/RSV testing.  Fact Sheet for Patients: BloggerCourse.com  Fact Sheet for Healthcare Providers: SeriousBroker.it  This test is not yet approved or cleared by the [place] FDA and has been authorized for detection and/or diagnosis of SARS-CoV-2 by FDA under an Emergency Use Authorization (EUA). This EUA will remain in effect (meaning this test can be used) for the duration of the COVID-19 declaration under Section 564(b)(1) of the Act, 21 U.S.C. section 360bbb-3(b)(1), unless the authorization is terminated or revoked.  Performed at [facility], [address], [place], [place] [address]   C Difficile Quick Screen w PCR reflex     Status: None   Collection Time: 06/17/21  9:20 AM   Specimen: STOOL  Result Value Ref Range Status   C Diff antigen NEGATIVE NEGATIVE Final   C Diff toxin NEGATIVE NEGATIVE Final   C Diff interpretation No C. difficile detected.  Final    Comment: Performed at [facility], [address] [place], [place] [address]  Gastrointestinal Panel by PCR , Stool     Status: None   Collection Time: 06/17/21  9:20 AM   Specimen: STOOL  Result Value Ref Range Status   Campylobacter species NOT DETECTED NOT DETECTED Final   Plesimonas shigelloides NOT DETECTED NOT DETECTED Final   Salmonella species NOT DETECTED NOT DETECTED Final   Yersinia enterocolitica NOT DETECTED NOT DETECTED Final   Vibrio species NOT DETECTED NOT DETECTED Final   Vibrio cholerae NOT DETECTED NOT DETECTED Final   Enteroaggregative E coli (EAEC) NOT DETECTED NOT DETECTED Final   Enteropathogenic E coli (EPEC) NOT  DETECTED NOT DETECTED Final   Enterotoxigenic E coli (ETEC) NOT DETECTED NOT DETECTED Final   Shiga like toxin producing E coli (STEC) NOT DETECTED NOT DETECTED Final   Shigella/Enteroinvasive E coli (EIEC) NOT DETECTED NOT DETECTED Final   Cryptosporidium NOT DETECTED NOT DETECTED Final   Cyclospora cayetanensis NOT DETECTED NOT DETECTED Final   Entamoeba histolytica NOT DETECTED NOT DETECTED Final   Giardia lamblia NOT DETECTED NOT DETECTED Final  Adenovirus F40/41 NOT DETECTED NOT DETECTED Final   Astrovirus NOT DETECTED NOT DETECTED Final   Norovirus GI/GII NOT DETECTED NOT DETECTED Final   Rotavirus A NOT DETECTED NOT DETECTED Final   Sapovirus (I, II, IV, and V) NOT DETECTED NOT DETECTED Final    Comment: Performed at [facility], [address] [place], [place] [address]      Radiology Studies: MR BRAIN WO CONTRAST  Result Date: 06/22/2021 CLINICAL DATA:  Mental status change, unknown cause. EXAM: MRI HEAD WITHOUT CONTRAST TECHNIQUE: Multiplanar, multiecho pulse sequences of the brain and surrounding structures were obtained without intravenous contrast. COMPARISON:  MRI of the brain June 17, 2021 FINDINGS: The study is degraded by motion and incomplete. Brain: No acute infarction, large hemorrhage, hydrocephalus, extra-axial collection or mass lesion. Moderate parenchymal volume loss. Vascular: Normal flow voids. Skull and upper cervical spine: Normal marrow signal. Sinuses/Orbits: Interval improvement of maxillary sinusitis. The orbits are maintained. IMPRESSION: 1. Incomplete study, degraded by motion. 2. No acute intracranial abnormality identified. Electronically Signed   By: Baldemar LenisKatyucia  [place] Rodrigues M.D.   On: 06/22/2021 11:38    Pamella Pertostin Gabriellia Rempel, MD, PhD [facility]  Between 7 am - 7 pm I am available, please contact me via Amion (for emergencies) or Securechat (non urgent messages)  Between 7 pm - 7 am I am not available, please contact night coverage  MD/APP via Amion

## 2021-06-23 NOTE — Plan of Care (Signed)
  Problem: Clinical Measurements: Goal: Cardiovascular complication will be avoided Outcome: Progressing   Problem: Coping: Goal: Level of anxiety will decrease Outcome: Progressing   Problem: Safety: Goal: Ability to remain free from injury will improve Outcome: Progressing   Problem: Education: Goal: Knowledge of General Education information will improve Description: Including pain rating scale, medication(s)/side effects and non-pharmacologic comfort measures Outcome: Not Progressing

## 2021-06-23 NOTE — Progress Notes (Signed)
Progress Note  Patient Name: Warren Salas Date of Encounter: 06/23/2021  [facility] Cardiologist: None   Subjective   Somnolent but arousable, answering questions  Inpatient Medications    Scheduled Meds:  aspirin EC  81 mg Oral Daily   enoxaparin (LOVENOX) injection  40 mg Subcutaneous Q24H   folic acid  1 mg Oral Daily   Gerhardt's butt cream   Topical QID   insulin aspart  0-15 Units Subcutaneous TID WC   insulin aspart  0-5 Units Subcutaneous QHS   metoprolol tartrate  50 mg Oral BID   multivitamin with minerals  1 tablet Oral Daily   nicotine  21 mg Transdermal Daily   thiamine  100 mg Oral Daily   Or   thiamine  100 mg Intravenous Daily   Continuous Infusions:  diltiazem (CARDIZEM) infusion Stopped (06/21/21 1312)   PRN Meds: acetaminophen, guaiFENesin-dextromethorphan, haloperidol lactate, levalbuterol, loperamide, ondansetron **OR** ondansetron (ZOFRAN) IV   Vital Signs    Vitals:   06/22/21 0454 06/22/21 0635 06/22/21 0730 06/22/21 1400  BP: 134/68   137/75  Pulse: 92   85  Resp: (!) 24   20  Temp:  98.4 F (36.9 C) 98.6 F (37 C) 99.1 F (37.3 C)  TempSrc:   Axillary Axillary  SpO2: 91%   93%  Weight:      Height:        Intake/Output Summary (Last 24 hours) at 06/23/2021 1138 Last data filed at 06/23/2021 0500 Gross per 24 hour  Intake 0 ml  Output 700 ml  Net -700 ml   Last 3 Weights 06/16/2021 06/14/2021 06/04/2021  Weight (lbs) 141 lb 1.5 oz 139 lb 15.9 oz 139 lb 15.9 oz  Weight (kg) 64 kg 63.5 kg 63.5 kg  Some encounter information is confidential and restricted. Go to Review Flowsheets activity to see all data.      Telemetry    NSR in 70s, no further episodes of tahcycardia today - Personally Reviewed  ECG     No new ECG- Personally Reviewed  Physical Exam   GEN: Somnolent but arousable, answering questions but difficult to understnad Neck: No JVD Cardiac: RRR, no murmurs, rubs, or gallops.  Respiratory: Clear to  auscultation bilaterally. GI: Soft, nontender, non-distended  MS: No edema; No deformity. Neuro:  Oriented to person Psych: Unable to assess  Labs    High Sensitivity Troponin:  No results for input(s): TROPONINIHS in the last 720 hours.    Chemistry Recent Labs  Lab 06/17/21 0509 06/18/21 0603 06/20/21 0636 06/23/21 0414  NA 132* 130* 132* 135  K 3.8 3.5 3.4* 3.5  CL 100 98 99 102  CO2 23 24 25 24   GLUCOSE 81 84 103* 83  BUN <5* 7 5* 7  CREATININE 0.48* 0.47* 0.53* 0.55*  CALCIUM 8.0* 8.3* 8.4* 8.4*  PROT 6.8  --  6.6 6.8  ALBUMIN 3.0*  --  2.9* 3.0*  AST 86*  --  53* 41  ALT 25  --  20 17  ALKPHOS 138*  --  106 88  BILITOT 2.6*  --  3.2* 3.2*  GFRNONAA >60 >60 >60 >60  ANIONGAP 9 8 8 9      Hematology Recent Labs  Lab 06/18/21 0603 06/20/21 0636 06/23/21 0414  WBC 6.5 6.8 6.5  RBC 3.41* 3.63* 3.67*  HGB 11.5* 12.4* 12.6*  HCT 33.9* 37.1* 37.6*  MCV 99.4 102.2* 102.5*  MCH 33.7 34.2* 34.3*  MCHC 33.9 33.4 33.5  RDW 18.6* 18.5*  18.5*  PLT 55* 67* 82*    BNPNo results for input(s): BNP, PROBNP in the last 168 hours.   DDimer No results for input(s): DDIMER in the last 168 hours.   Radiology    MR BRAIN WO CONTRAST  Result Date: 06/22/2021 CLINICAL DATA:  Mental status change, unknown cause. EXAM: MRI HEAD WITHOUT CONTRAST TECHNIQUE: Multiplanar, multiecho pulse sequences of the brain and surrounding structures were obtained without intravenous contrast. COMPARISON:  MRI of the brain June 17, 2021 FINDINGS: The study is degraded by motion and incomplete. Brain: No acute infarction, large hemorrhage, hydrocephalus, extra-axial collection or mass lesion. Moderate parenchymal volume loss. Vascular: Normal flow voids. Skull and upper cervical spine: Normal marrow signal. Sinuses/Orbits: Interval improvement of maxillary sinusitis. The orbits are maintained. IMPRESSION: 1. Incomplete study, degraded by motion. 2. No acute intracranial abnormality identified.  Electronically Signed   By: Baldemar Lenis M.D.   On: 06/22/2021 11:38   ECHOCARDIOGRAM COMPLETE  Result Date: 06/21/2021    ECHOCARDIOGRAM REPORT   Patient Name:   Warren Salas Date of Exam: 06/21/2021 Medical Rec #:  [id number]      Height:       66.0 in Accession #:    [id number]     Weight:       141.1 lb Date of Birth:  1961-05-24      BSA:          1.724 m Patient Age:    60 years       BP:           134/75 mmHg Patient Gender: M              HR:           73 bpm. Exam Location:  Inpatient Procedure: 2D Echo, Cardiac Doppler and Color Doppler Indications:    R94.31 Abnormal EKG  History:        Patient has prior history of Echocardiogram examinations, most                 recent 11/02/2017. Previous Myocardial Infarction, COPD; Risk                 Factors:Hypertension and Diabetes.  Sonographer:    Elmarie Shiley Dance Referring Phys: [id number] Daylene Katayama GHERGHE IMPRESSIONS  1. Left ventricular ejection fraction, by estimation, is 60 to 65%. The left ventricle has normal function. The left ventricle has no regional wall motion abnormalities. Left ventricular diastolic parameters were normal.  2. Right ventricular systolic function is normal. The right ventricular size is normal. Tricuspid regurgitation signal is inadequate for assessing PA pressure.  3. The mitral valve is normal in structure. Trivial mitral valve regurgitation. No evidence of mitral stenosis.  4. The aortic valve is tricuspid. Aortic valve regurgitation is not visualized. Mild to moderate aortic valve sclerosis/calcification is present, without any evidence of aortic stenosis.  5. The inferior vena cava is normal in size with greater than 50% respiratory variability, suggesting right atrial pressure of 3 mmHg. FINDINGS  Left Ventricle: Left ventricular ejection fraction, by estimation, is 60 to 65%. The left ventricle has normal function. The left ventricle has no regional wall motion abnormalities. The left ventricular internal cavity  size was normal in size. There is  no left ventricular hypertrophy. Left ventricular diastolic parameters were normal. Right Ventricle: The right ventricular size is normal. No increase in right ventricular wall thickness. Right ventricular systolic function is normal. Tricuspid regurgitation signal is inadequate for assessing PA  pressure. Left Atrium: Left atrial size was normal in size. Right Atrium: Right atrial size was normal in size. Pericardium: There is no evidence of pericardial effusion. Mitral Valve: The mitral valve is normal in structure. Trivial mitral valve regurgitation. No evidence of mitral valve stenosis. Tricuspid Valve: The tricuspid valve is normal in structure. Tricuspid valve regurgitation is trivial. Aortic Valve: The aortic valve is tricuspid. Aortic valve regurgitation is not visualized. Mild to moderate aortic valve sclerosis/calcification is present, without any evidence of aortic stenosis. Pulmonic Valve: The pulmonic valve was not well visualized. Pulmonic valve regurgitation is not visualized. Aorta: The aortic root and ascending aorta are structurally normal, with no evidence of dilitation. Venous: The inferior vena cava is normal in size with greater than 50% respiratory variability, suggesting right atrial pressure of 3 mmHg. IAS/Shunts: The interatrial septum was not well visualized.  LEFT VENTRICLE PLAX 2D LVIDd:         4.30 cm  Diastology LVIDs:         3.90 cm  LV e' medial:    8.49 cm/s LV PW:         1.00 cm  LV E/e' medial:  13.4 LV IVS:        1.00 cm  LV e' lateral:   10.30 cm/s LVOT diam:     2.00 cm  LV E/e' lateral: 11.1 LV SV:         66 LV SV Index:   38 LVOT Area:     3.14 cm  RIGHT VENTRICLE             IVC RV Basal diam:  2.70 cm     IVC diam: 1.90 cm RV S prime:     10.00 cm/s TAPSE (M-mode): 1.8 cm LEFT ATRIUM             Index       RIGHT ATRIUM           Index LA diam:        4.10 cm 2.38 cm/m  RA Area:     14.00 cm LA Vol (A2C):   57.4 ml 33.29 ml/m RA  Volume:   32.20 ml  18.68 ml/m LA Vol (A4C):   53.2 ml 30.85 ml/m LA Biplane Vol: 55.2 ml 32.01 ml/m  AORTIC VALVE LVOT Vmax:   105.00 cm/s LVOT Vmean:  65.500 cm/s LVOT VTI:    0.211 m  AORTA Ao Root diam: 3.10 cm Ao Asc diam:  3.15 cm MITRAL VALVE MV Area (PHT): 3.85 cm     SHUNTS MV Decel Time: 197 msec     Systemic VTI:  0.21 m MV E velocity: 114.00 cm/s  Systemic Diam: 2.00 cm MV A velocity: 96.70 cm/s MV E/A ratio:  1.18 Epifanio Lesches MD Electronically signed by Epifanio Lesches MD Signature Date/Time: 06/21/2021/1:17:29 PM    Final     Cardiac Studies   Echo 6/26:  1. Left ventricular ejection fraction, by estimation, is 60 to 65%. The  left ventricle has normal function. The left ventricle has no regional  wall motion abnormalities. Left ventricular diastolic parameters were  normal.   2. Right ventricular systolic function is normal. The right ventricular  size is normal. Tricuspid regurgitation signal is inadequate for assessing  PA pressure.   3. The mitral valve is normal in structure. Trivial mitral valve  regurgitation. No evidence of mitral stenosis.   4. The aortic valve is tricuspid. Aortic valve regurgitation is not  visualized. Mild to  moderate aortic valve sclerosis/calcification is  present, without any evidence of aortic stenosis.   5. The inferior vena cava is normal in size with greater than 50%  respiratory variability, suggesting right atrial pressure of 3 mmHg.   Patient Profile     60 y.o. male with a hx of lcohol abuse, tobacco use, COPD, CAD status post PCI to RCA and LAD, T2DM who is being seen for the evaluation of tachycardia   Assessment & Plan    Tachycardia: having episodes of regular narrow complex tachycardia, SVT vs Aflutter.  I do not see clear flutter waves, but rate is typically around 150 suggesting could be a 2:1 flutter.  Echo 6/26 showed normal biventricular function, no significant valvular disease. -Would recommend trying  adenosine administration during next episode.  If in SVT, should break rhythm.  If aflutter, should be able to see flutter waves -Continue metoprolol 50 mg twice daily -Would hold off on anticoagulation until confirm aflutter as not a trivial decision for him given his thrombocytopenia and social issues.  If Aflutter confirmed, recommend starting Eliquis 5 mg twice daily (and discontinuing aspirin as below)   Thrombocytopenia: Platelets down to 55 this admission, improved to 82 today.  Appears to be chronic issue, could be related to alcohol abuse.  Given considering starting anticoagulation as above, would consider hematology evaluation   CAD: Presented with STEMI in 10/2017 status post DES to RCA.  Underwent DES to LAD in 12/2017.  Not currently on antiplatelet.  Recommend starting aspirin 81 mg daily.  If confirm Aflutter as above, would switch from ASA to Eliquis 5 mg BID.  For questions or updates, please contact [facility] Please consult [email or web address] for contact info under        Signed, Little Ishikawa, MD  06/23/2021, 11:38 AM

## 2021-06-23 NOTE — Progress Notes (Signed)
Patient friend Georgette Shell [phone]) arrived and sitting at bedside. She met the pt through [place] and reports being available should pt require transportation

## 2021-06-24 LAB — CBC
HCT: 37.2 % — ABNORMAL LOW (ref 39.0–52.0)
Hemoglobin: 12.3 g/dL — ABNORMAL LOW (ref 13.0–17.0)
MCH: 34 pg (ref 26.0–34.0)
MCHC: 33.1 g/dL (ref 30.0–36.0)
MCV: 102.8 fL — ABNORMAL HIGH (ref 80.0–100.0)
Platelets: 92 10*3/uL — ABNORMAL LOW (ref 150–400)
RBC: 3.62 MIL/uL — ABNORMAL LOW (ref 4.22–5.81)
RDW: 18.5 % — ABNORMAL HIGH (ref 11.5–15.5)
WBC: 6.2 10*3/uL (ref 4.0–10.5)
nRBC: 0 % (ref 0.0–0.2)

## 2021-06-24 LAB — GLUCOSE, CAPILLARY
Glucose-Capillary: 81 mg/dL (ref 70–99)
Glucose-Capillary: 71 mg/dL (ref 70–99)
Glucose-Capillary: 115 mg/dL — ABNORMAL HIGH (ref 70–99)
Glucose-Capillary: 114 mg/dL — ABNORMAL HIGH (ref 70–99)

## 2021-06-24 LAB — VITAMIN B12: Vitamin B-12: 930 pg/mL — ABNORMAL HIGH (ref 180–914)

## 2021-06-24 LAB — TSH: TSH: 3.303 u[IU]/mL (ref 0.350–4.500)

## 2021-06-24 LAB — FOLATE: Folate: 17.1 ng/mL (ref >5.9)

## 2021-06-24 NOTE — Progress Notes (Signed)
Physical Therapy Treatment Patient Details Name: Warren Salas MRN: [id number] DOB: [birth year] Today's Date: 06/24/2021    History of Present Illness 60 year old male comes to the hospital with profuse diarrhea as well as weakness/dizziness.  Past medical history of alcohol abuse, homelessness currently living in a tent, COPD, DM 2, CAD, chronic back pain    PT Comments    Patient limited by lethargy and suspect due to medication given at night for agitation. Pt was arousable to  auditory/tactile stimuli and became more alert once sitting EOB. Pt required greater assist for mobility due to lethargy, Mod-Max assist for rolling and supine<>sit. He required min assist initially for balance and was able to progress to min guard with bil UE support at EOB for ~4 minutes. Pt fatigued and Mod-Max assist required to return to supine. +2 to reposition. Pt noted to have dry crusty oral cavity making it difficult to communicate. RN/NT notified of pt needing oral hygiene and NT in room at EOS to complete. Discharge recommendations updated to include SNF placement as pt requires increased assistance. Acute PT will continue to follow and progress as able.    Follow Up Recommendations  SNF (SNF vs no follow up if pt improves)     Equipment Recommendations  Cane (TBD,  pt reports not practical for RW in tent, agreeable to [facility])    Recommendations for Other Services       Precautions / Restrictions Precautions Precautions: Fall Precaution Comments: CIWA Restrictions Weight Bearing Restrictions: No    Mobility  Bed Mobility Overal bed mobility: Needs Assistance Bed Mobility: Rolling;Sidelying to Sit;Sit to Supine Rolling: Mod assist Sidelying to sit: Mod assist;Max assist;HOB elevated   Sit to supine: Mod assist;Max assist   General bed mobility comments: pt lethargic but arousable with auditory and tactiel cuse. pt unclear with speech today due to poor oral care. pt required mod assist to  initaite reaching Rt UE to bed rail and wsa able to grip it, pt initaited flexion hips/knees to hooklying position with mod assist to roll lower trunk into sidelying. Mod-Mas assist to initiate press up to raise trunk. pt able to hold balance for ~4 minutes at EOB with bil UE support. Mod assist to return to supine and MAx assist +2 to boost.reposition in bed. Pt more alert at EOS and NT in room to perfrom oral hygeine per PT request.    Transfers                    Ambulation/Gait                 Stairs             Wheelchair Mobility    Modified Rankin (Stroke Patients Only)       Balance Overall balance assessment: Needs assistance Sitting-balance support: Bilateral upper extremity supported Sitting balance-Leahy Scale: Fair Sitting balance - Comments: min gurad for seated balance requires bil UE support                                    Cognition Arousal/Alertness: Lethargic;Suspect due to medications Behavior During Therapy: Flat affect Overall Cognitive Status: Difficult to assess                                        Exercises  General Comments        Pertinent Vitals/Pain Pain Assessment: Faces Faces Pain Scale: No hurt Pain Intervention(s): Limited activity within patient's tolerance;Monitored during session;Repositioned    Home Living                      Prior Function            PT Goals (current goals can now be found in the care plan section) Acute Rehab PT Goals PT Goal Formulation: With patient Time For Goal Achievement: 07/02/21 Potential to Achieve Goals: Good Progress towards PT goals: Not progressing toward goals - comment (suspect decline due to withdrawls and medication)    Frequency    Min 3X/week      PT Plan Current plan remains appropriate    Co-evaluation              AM-PAC PT "6 Clicks" Mobility   Outcome Measure  Help needed turning from your  back to your side while in a flat bed without using bedrails?: A Lot Help needed moving from lying on your back to sitting on the side of a flat bed without using bedrails?: A Lot Help needed moving to and from a bed to a chair (including a wheelchair)?: A Lot Help needed standing up from a chair using your arms (e.g., wheelchair or bedside chair)?: A Lot Help needed to walk in hospital room?: Total Help needed climbing 3-5 steps with a railing? : Total 6 Click Score: 10    End of Session   Activity Tolerance: Patient limited by lethargy Patient left: in bed;with call bell/phone within reach;with bed alarm set;with nursing/sitter in room Nurse Communication: Mobility status PT Visit Diagnosis: Difficulty in walking, not elsewhere classified (R26.2);Muscle weakness (generalized) (M62.81)     Time: 6213-0865 PT Time Calculation (min) (ACUTE ONLY): 18 min  Charges:  $Therapeutic Activity: 8-22 mins                     Warren Salas, Warren Salas Acute Rehabilitation Services Office [phone] Pager [phone]    Anitra Lauth 06/24/2021, 11:04 AM

## 2021-06-24 NOTE — Progress Notes (Signed)
PROGRESS NOTE    Warren Salas  IEP:[id number] DOB: 05-04-1961 DOA: 06/16/2021 PCP: Patient, No Pcp Per (Inactive)     Brief Narrative:  Warren Salas is a 60 year old male with alcohol abuse, homelessness currently living in a tent, COPD, DM 2, CAD, chronic back pain comes to the hospital with profuse diarrhea as well as weakness/dizziness.  He tells me his diarrhea is somewhat chronic going back a few months but in the last few days it has gotten significantly worse with more than 10 watery bowel movements per day.  He is also been extremely weak and dizzy upon standing and walking.  Patient was admitted with alcohol abuse with significant alcohol withdrawal, metabolic encephalopathy.  He also developed tachycardia, SVT versus a flutter.  Patient was started on metoprolol, cardiology consulted.  New events last 24 hours / Subjective: Patient arousable to voice although remains very fatigued.  He is oriented to [facility], year 2022.  He is asking where his breakfast is.  Does not stay really engaged in most of the examination.  Assessment & Plan:   Principal Problem:   Dizziness Active Problems:   CAD (coronary artery disease)   Tobacco abuse   Type 2 diabetes mellitus with complication, without long-term current use of insulin (HCC)   Alcohol dependence with alcohol-induced mood disorder (HCC)   Asthma   Depressive disorder   Essential hypertension   Hyponatremia   Pressure injury of skin   Tachycardia   Thrombocytopenia (HCC)   Alcohol abuse with withdrawal -MRI brain degraded by motion but no acute intracranial abnormality identified -Has not received Ativan since 6/28 -Vitamin B1 pending, giving IV replacement due to concern for Wernicke's -Check B12, folate   Tachycardia, atrial flutter versus SVT -Appreciate cardiology, follow-up outpatient 8/12 -Poor candidate for anticoagulant -Continue metoprolol -TSH within normal limit  Thrombocytopenia -Likely  in setting of significant alcohol abuse -Improving -Refer to hematology as outpatient  CAD -Continue aspirin  Diabetes mellitus type 2 -SSI  Diarrhea -C. difficile, GI PCR negative   In agreement with assessment of the pressure ulcer as below:  Pressure Injury 06/17/21 Buttocks Right;Left Stage 2 -  Partial thickness loss of dermis presenting as a shallow open injury with a red, pink wound bed without slough. (Active)  06/17/21 0106  Location: Buttocks  Location Orientation: Right;Left  Staging: Stage 2 -  Partial thickness loss of dermis presenting as a shallow open injury with a red, pink wound bed without slough.  Wound Description (Comments):   Present on Admission: Yes         DVT prophylaxis:  enoxaparin (LOVENOX) injection 40 mg Start: 06/17/21 1000  Code Status:     Code Status Orders  (From admission, onward)           Start     Ordered   06/17/21 0050  Full code  Continuous        06/17/21 0049           Code Status History     Date Active Date Inactive Code Status Order ID Comments User Context   09/21/2019 1001 09/22/2019 0040 Full Code [id number]  Dietrich Pates, PA-C ED   06/26/2019 1154 06/29/2019 1355 Full Code [id number]  Hughie Closs, MD Inpatient   03/27/2019 0629 03/27/2019 1927 Full Code [id number]  Hillary Bow, DO ED   12/09/2018 0009 12/09/2018 1404 Full Code [id number]  Emi Holes, PA-C ED   12/05/2018 0515 12/07/2018 1609 Full Code [id number]  Jackelyn Poling,  NP Inpatient   12/04/2018 1420 12/05/2018 0429 Full Code [id number]  Samuel Jester, DO ED   11/16/2018 1020 11/22/2018 1818 Full Code [id number]  Kerry Hough PA-C Inpatient   10/24/2018 0108 10/24/2018 1314 Full Code [id number]  Garlon Hatchet, PA-C ED   12/29/2017 1148 12/29/2017 2110 Full Code [id number]  Corky Crafts, MD Inpatient   11/01/2017 1140 11/04/2017 1641 Full Code [id number]  Corky Crafts, MD Inpatient      Family Communication: None at bedside   Disposition Plan:  Status is: Inpatient  Remains inpatient appropriate because:Altered mental status  Dispo: The patient is from:  Homeless              Anticipated d/c is to:  Homeless              Patient currently is not medically stable to d/c.   Difficult to place patient No      Antimicrobials:  Anti-infectives (From admission, onward)    None        Objective: Vitals:   06/24/21 0400 06/24/21 0800 06/24/21 0900 06/24/21 1215  BP: 107/66 123/65 123/64 123/66  Pulse: 71 72 72 68  Resp: 16 17 17 20   Temp: 98.2 F (36.8 C)  97.7 F (36.5 C) 97.6 F (36.4 C)  TempSrc:   Axillary Axillary  SpO2: 90% 96% 96% 95%  Weight:      Height:        Intake/Output Summary (Last 24 hours) at 06/24/2021 1330 Last data filed at 06/23/2021 2258 Gross per 24 hour  Intake 31.63 ml  Output 300 ml  Net -268.37 ml   Filed Weights   06/16/21 1835  Weight: 64 kg    Examination:  General exam: Appears calm and comfortable but fatigued and lethargic  Respiratory system: Clear to auscultation. Respiratory effort normal. No respiratory distress. No conversational dyspnea. On room air  Cardiovascular system: S1 & S2 heard, RRR. No murmurs. No pedal edema. Gastrointestinal system: Abdomen is nondistended, soft and nontender. Normal bowel sounds heard. Central nervous system: Alert and oriented to self, place, year  Extremities: Symmetric in appearance   Data Reviewed: I have personally reviewed following labs and imaging studies  CBC: Recent Labs  Lab 06/18/21 0603 06/20/21 0636 06/23/21 0414 06/24/21 0821  WBC 6.5 6.8 6.5 6.2  HGB 11.5* 12.4* 12.6* 12.3*  HCT 33.9* 37.1* 37.6* 37.2*  MCV 99.4 102.2* 102.5* 102.8*  PLT 55* 67* 82* 92*   Basic Metabolic Panel: Recent Labs  Lab 06/18/21 0603 06/20/21 0636 06/23/21 0414  NA 130* 132* 135  K 3.5 3.4* 3.5  CL 98 99 102  CO2 24 25 24   GLUCOSE 84 103* 83  BUN 7 5* 7  CREATININE 0.47* 0.53* 0.55*  CALCIUM 8.3* 8.4*  8.4*   GFR: Estimated Creatinine Clearance: 88.6 mL/min (A) (by C-G formula based on SCr of 0.55 mg/dL (L)). Liver Function Tests: Recent Labs  Lab 06/20/21 0636 06/23/21 0414  AST 53* 41  ALT 20 17  ALKPHOS 106 88  BILITOT 3.2* 3.2*  PROT 6.6 6.8  ALBUMIN 2.9* 3.0*   No results for input(s): LIPASE, AMYLASE in the last 168 hours. No results for input(s): AMMONIA in the last 168 hours. Coagulation Profile: No results for input(s): INR, PROTIME in the last 168 hours. Cardiac Enzymes: No results for input(s): CKTOTAL, CKMB, CKMBINDEX, TROPONINI in the last 168 hours. BNP (last 3 results) No results for input(s): PROBNP in the last 8760 hours. HbA1C: No results  for input(s): HGBA1C in the last 72 hours. CBG: Recent Labs  Lab 06/23/21 1114 06/23/21 1715 06/23/21 2058 06/24/21 0708 06/24/21 1109  GLUCAP 91 75 91 81 71   Lipid Profile: No results for input(s): CHOL, HDL, LDLCALC, TRIG, CHOLHDL, LDLDIRECT in the last 72 hours. Thyroid Function Tests: Recent Labs    06/24/21 0821  TSH 3.303   Anemia Panel: No results for input(s): VITAMINB12, FOLATE, FERRITIN, TIBC, IRON, RETICCTPCT in the last 72 hours. Sepsis Labs: No results for input(s): PROCALCITON, LATICACIDVEN in the last 168 hours.  Recent Results (from the past 240 hour(s))  Resp Panel by RT-PCR (Flu A&B, Covid) Nasopharyngeal Swab     Status: None   Collection Time: 06/16/21 11:25 PM   Specimen: Nasopharyngeal Swab; Nasopharyngeal(NP) swabs in vial transport medium  Result Value Ref Range Status   SARS Coronavirus 2 by RT PCR NEGATIVE NEGATIVE Final    Comment: (NOTE) SARS-CoV-2 target nucleic acids are NOT DETECTED.  The SARS-CoV-2 RNA is generally detectable in upper respiratory specimens during the acute phase of infection. The lowest concentration of SARS-CoV-2 viral copies this assay can detect is 138 copies/mL. A negative result does not preclude SARS-Cov-2 infection and should not be used as the  sole basis for treatment or other patient management decisions. A negative result may occur with  improper specimen collection/handling, submission of specimen other than nasopharyngeal swab, presence of viral mutation(s) within the areas targeted by this assay, and inadequate number of viral copies(<138 copies/mL). A negative result must be combined with clinical observations, patient history, and epidemiological information. The expected result is Negative.  Fact Sheet for Patients:  BloggerCourse.comhttps://www.fda.gov/media/152166/download  Fact Sheet for Healthcare Providers:  SeriousBroker.ithttps://www.fda.gov/media/152162/download  This test is no t yet approved or cleared by the [place] FDA and  has been authorized for detection and/or diagnosis of SARS-CoV-2 by FDA under an Emergency Use Authorization (EUA). This EUA will remain  in effect (meaning this test can be used) for the duration of the COVID-19 declaration under Section 564(b)(1) of the Act, 21 U.S.C.section 360bbb-3(b)(1), unless the authorization is terminated  or revoked sooner.       Influenza A by PCR NEGATIVE NEGATIVE Final   Influenza B by PCR NEGATIVE NEGATIVE Final    Comment: (NOTE) The Xpert Xpress SARS-CoV-2/FLU/RSV plus assay is intended as an aid in the diagnosis of influenza from Nasopharyngeal swab specimens and should not be used as a sole basis for treatment. Nasal washings and aspirates are unacceptable for Xpert Xpress SARS-CoV-2/FLU/RSV testing.  Fact Sheet for Patients: BloggerCourse.comhttps://www.fda.gov/media/152166/download  Fact Sheet for Healthcare Providers: SeriousBroker.ithttps://www.fda.gov/media/152162/download  This test is not yet approved or cleared by the [place] FDA and has been authorized for detection and/or diagnosis of SARS-CoV-2 by FDA under an Emergency Use Authorization (EUA). This EUA will remain in effect (meaning this test can be used) for the duration of the COVID-19 declaration under Section 564(b)(1) of the  Act, 21 U.S.C. section 360bbb-3(b)(1), unless the authorization is terminated or revoked.  Performed at [facility], [address] [place], [place] [address]   C Difficile Quick Screen w PCR reflex     Status: None   Collection Time: 06/17/21  9:20 AM   Specimen: STOOL  Result Value Ref Range Status   C Diff antigen NEGATIVE NEGATIVE Final   C Diff toxin NEGATIVE NEGATIVE Final   C Diff interpretation No C. difficile detected.  Final    Comment: Performed at [facility], [address] [place], [place] [address]  Gastrointestinal Panel by PCR , Stool     Status: None   Collection Time: 06/17/21  9:20 AM   Specimen: STOOL  Result Value Ref Range Status   Campylobacter species NOT DETECTED NOT DETECTED Final   Plesimonas shigelloides NOT DETECTED NOT DETECTED Final   Salmonella species NOT DETECTED NOT DETECTED Final   Yersinia enterocolitica NOT DETECTED NOT DETECTED Final   Vibrio species NOT DETECTED NOT DETECTED Final   Vibrio cholerae NOT DETECTED NOT DETECTED Final   Enteroaggregative E coli (EAEC) NOT DETECTED NOT DETECTED Final   Enteropathogenic E coli (EPEC) NOT DETECTED NOT DETECTED Final   Enterotoxigenic E coli (ETEC) NOT DETECTED NOT DETECTED Final   Shiga like toxin producing E coli (STEC) NOT DETECTED NOT DETECTED Final   Shigella/Enteroinvasive E coli (EIEC) NOT DETECTED NOT DETECTED Final   Cryptosporidium NOT DETECTED NOT DETECTED Final   Cyclospora cayetanensis NOT DETECTED NOT DETECTED Final   Entamoeba histolytica NOT DETECTED NOT DETECTED Final   Giardia lamblia NOT DETECTED NOT DETECTED Final   Adenovirus F40/41 NOT DETECTED NOT DETECTED Final   Astrovirus NOT DETECTED NOT DETECTED Final   Norovirus GI/GII NOT DETECTED NOT DETECTED Final   Rotavirus A NOT DETECTED NOT DETECTED Final   Sapovirus (I, II, IV, and V) NOT DETECTED NOT DETECTED Final    Comment: Performed at [facility], [address],  [place], [place] [address]      Radiology Studies: No results found.    Scheduled Meds:  aspirin EC  81 mg Oral Daily   enoxaparin (LOVENOX) injection  40 mg Subcutaneous Q24H   folic acid  1 mg Oral Daily   Gerhardt's butt cream   Topical QID   insulin aspart  0-15 Units Subcutaneous TID WC   insulin aspart  0-5 Units Subcutaneous QHS   metoprolol tartrate  50 mg Oral BID   multivitamin with minerals  1 tablet Oral Daily   nicotine  21 mg Transdermal Daily   Continuous Infusions:  diltiazem (CARDIZEM) infusion Stopped (06/21/21 1312)   thiamine injection 500 mg (06/24/21 1205)     LOS: 7 days      Time spent: 35 minutes   Noralee Stain, DO [facility] 06/24/2021, 1:30 PM   Available via Epic secure chat 7am-7pm After these hours, please refer to coverage provider listed on [email or web address]

## 2021-06-24 NOTE — Plan of Care (Signed)
  Problem: Coping: Goal: Level of anxiety will decrease Outcome: Progressing   Problem: Elimination: Goal: Will not experience complications related to urinary retention Outcome: Progressing   Problem: Pain Managment: Goal: General experience of comfort will improve Outcome: Progressing   Problem: Safety: Goal: Ability to remain free from injury will improve Outcome: Progressing   Problem: Skin Integrity: Goal: Risk for impaired skin integrity will decrease Outcome: Progressing   Problem: Safety: Goal: Non-violent Restraint(s) Outcome: Progressing   

## 2021-06-24 NOTE — Progress Notes (Addendum)
Progress Note  Patient Name: Warren Salas Date of Encounter: 06/24/2021  Primary Cardiologist: Lance Muss, MD  Subjective   Remains somnolent, briefly arousable. Does not really answer questions.+ moves extremities spontaneously.  Msg sent to nursing team to input VS to Epic -> not crossing over. HR 67, BP 123/65, POx 96%, RR 14.  Inpatient Medications    Scheduled Meds:  aspirin EC  81 mg Oral Daily   enoxaparin (LOVENOX) injection  40 mg Subcutaneous Q24H   folic acid  1 mg Oral Daily   Gerhardt's butt cream   Topical QID   insulin aspart  0-15 Units Subcutaneous TID WC   insulin aspart  0-5 Units Subcutaneous QHS   metoprolol tartrate  50 mg Oral BID   multivitamin with minerals  1 tablet Oral Daily   nicotine  21 mg Transdermal Daily   Continuous Infusions:  diltiazem (CARDIZEM) infusion Stopped (06/21/21 1312)   thiamine injection 500 mg (06/24/21 0504)   PRN Meds: acetaminophen, guaiFENesin-dextromethorphan, haloperidol lactate, levalbuterol, loperamide, ondansetron **OR** ondansetron (ZOFRAN) IV   Vital Signs    Vitals:   06/22/21 0730 06/22/21 1400 06/23/21 1244 06/23/21 2257  BP:  137/75 (!) 144/73 130/71  Pulse:  85 76 77  Resp:  20 16   Temp: 98.6 F (37 C) 99.1 F (37.3 C) 98.4 F (36.9 C)   TempSrc: Axillary Axillary Axillary   SpO2:  93%    Weight:      Height:        Intake/Output Summary (Last 24 hours) at 06/24/2021 0836 Last data filed at 06/23/2021 2258 Gross per 24 hour  Intake 31.63 ml  Output 300 ml  Net -268.37 ml   Last 3 Weights 06/16/2021 06/14/2021 06/04/2021  Weight (lbs) 141 lb 1.5 oz 139 lb 15.9 oz 139 lb 15.9 oz  Weight (kg) 64 kg 63.5 kg 63.5 kg  Some encounter information is confidential and restricted. Go to Review Flowsheets activity to see all data.     Telemetry    NSR. No interval events since yesterday - Personally Reviewed  Physical Exam   GEN: No acute distress, dissheveled appearing WM. HEENT:  Normocephalic, atraumatic, sclera non-icteric. Neck: No JVD or bruits. Cardiac: RRR no murmurs, rubs, or gallops.  Respiratory: Clear to auscultation bilaterally. Breathing is unlabored. GI: Soft, nontender, non-distended, BS +x 4. MS: no acute deformity. Extremities: No clubbing or cyanosis. No edema. Distal pedal pulses are 2+ and equal bilaterally. Neuro: Somnolent but briefly arousable, does not answer questions but follows some commands briefly and moves extremities spontaneously Psych: Calm, not agitated  Labs    High Sensitivity Troponin:  No results for input(s): TROPONINIHS in the last 720 hours.    Cardiac EnzymesNo results for input(s): TROPONINI in the last 168 hours. No results for input(s): TROPIPOC in the last 168 hours.   Chemistry Recent Labs  Lab 06/18/21 0603 06/20/21 0636 06/23/21 0414  NA 130* 132* 135  K 3.5 3.4* 3.5  CL 98 99 102  CO2 24 25 24   GLUCOSE 84 103* 83  BUN 7 5* 7  CREATININE 0.47* 0.53* 0.55*  CALCIUM 8.3* 8.4* 8.4*  PROT  --  6.6 6.8  ALBUMIN  --  2.9* 3.0*  AST  --  53* 41  ALT  --  20 17  ALKPHOS  --  106 88  BILITOT  --  3.2* 3.2*  GFRNONAA >60 >60 >60  ANIONGAP 8 8 9      Hematology Recent Labs  Lab  06/18/21 0603 06/20/21 0636 06/23/21 0414  WBC 6.5 6.8 6.5  RBC 3.41* 3.63* 3.67*  HGB 11.5* 12.4* 12.6*  HCT 33.9* 37.1* 37.6*  MCV 99.4 102.2* 102.5*  MCH 33.7 34.2* 34.3*  MCHC 33.9 33.4 33.5  RDW 18.6* 18.5* 18.5*  PLT 55* 67* 82*    BNPNo results for input(s): BNP, PROBNP in the last 168 hours.   DDimer No results for input(s): DDIMER in the last 168 hours.   Radiology    MR BRAIN WO CONTRAST  Result Date: 06/22/2021 CLINICAL DATA:  Mental status change, unknown cause. EXAM: MRI HEAD WITHOUT CONTRAST TECHNIQUE: Multiplanar, multiecho pulse sequences of the brain and surrounding structures were obtained without intravenous contrast. COMPARISON:  MRI of the brain June 17, 2021 FINDINGS: The study is degraded by  motion and incomplete. Brain: No acute infarction, large hemorrhage, hydrocephalus, extra-axial collection or mass lesion. Moderate parenchymal volume loss. Vascular: Normal flow voids. Skull and upper cervical spine: Normal marrow signal. Sinuses/Orbits: Interval improvement of maxillary sinusitis. The orbits are maintained. IMPRESSION: 1. Incomplete study, degraded by motion. 2. No acute intracranial abnormality identified. Electronically Signed   By: Baldemar Lenis M.D.   On: 06/22/2021 11:38    Cardiac Studies   Echo 06/21/21  1. Left ventricular ejection fraction, by estimation, is 60 to 65%. The  left ventricle has normal function. The left ventricle has no regional  wall motion abnormalities. Left ventricular diastolic parameters were  normal.   2. Right ventricular systolic function is normal. The right ventricular  size is normal. Tricuspid regurgitation signal is inadequate for assessing  PA pressure.   3. The mitral valve is normal in structure. Trivial mitral valve  regurgitation. No evidence of mitral stenosis.   4. The aortic valve is tricuspid. Aortic valve regurgitation is not  visualized. Mild to moderate aortic valve sclerosis/calcification is  present, without any evidence of aortic stenosis.   5. The inferior vena cava is normal in size with greater than 50%  respiratory variability, suggesting right atrial pressure of 3 mmHg.  Patient Profile     60 y.o. male experiencing homelessness (lives in tent)with alcohol abuse, tobacco abuse, COPD, CAD with STEMI 10/2017 s/p DES to RCA, prior DES to LAD 12/2017, DM, chronic thrombocytopenia. Presented to the hospital with worsening diarrhea, weakness, dizziness, hyponatremia. Diarrhea felt chronic. Dizziness felt related to ETOH intoxication as levels were positive on admission. MRI brain negative for acute CVA. Hospital course complicated by ETOH withdrawals/confusion with delirium/acute encephalopathy. Cardiology  consulted for narrow complex tachycardia felt SVT vs atrial flutter.  Assessment & Plan    1. Tachycardia - narrow complex, appears to be SVT but cannot fully exclude atrial flutter (no interruptions seen to suggest flutter waves) - check TSH - no further events with addition of beta blocker - seems to be a poor candiate for OAC even if this was flutter given poor social situation, active ETOH, thrombocytopenia - continue BB as tolerated - will defer lyte management to primary team given his mental status - would suggest to keep K 4.0 or greater and Mg 2.0 or greater  2. Thrombocytopenia, also macrocytic anemia - low platelet count appears to be chronic issue with nadir as low as 47 in 2021 - consider heme consultation per Dr. Campbell Lerner note given potential need for consideration of anticoagulation in the future - last check 82 yesterday, CBC pending for today  3. CAD s/p prior MI/PCI as above - per MD note, ASA 81mg  daily resumed -  continue BB as tolerated - statin can be considered on outpatient basis although given his ETOH/platelet issue, would be concerned for underlying liver disease   4. Social issues including homelessness, ETOH, tobacco use - per IM note, patient declines to go to shelter and will likely return to tent - no UDS obtained   Have tentatively arranged f/u 8/12, appt placed on AVS. Aside from checking thyroid, do not suspect any new acute recs. Remainder of medical issues per primary team.  [facility] will sign off.   Medication Recommendations:  metoprolol 50 mg BID, ASA 81 mg daily Other recommendations (labs, testing, etc):  None Follow up as an outpatient:  Scheduled for 8/12   For questions or updates, please contact [facility] Please consult [email or web address] for contact info under Cardiology/STEMI.  Signed, Laurann Montana, PA-C 06/24/2021, 8:36 AM    Patient seen and examined.  Agree with above documentation.  On exam, patient is somnolent but  arousable, not answering questions or following commands, regular rate and rhythm, no murmurs, lungs CTAB, no lower extremity edema.  No further episodes of SVT since starting metoprolol 50 mg twice daily.  We will continue.  Differential for episodes includes 2-1 atrial flutter, planned to give adenosine to try and see if flutter waves with episodes.  However has not had any episodes over the last 48 hours since increasing metoprolol dose.  He is likely not a good candidate for anticoagulation anyways given his thrombocytopenia and social issues.  Would plan to continue metoprolol and aspirin 81 mg daily.  We will sign off at this time, follow-up has been scheduled for 8/12.  Little Ishikawa, MD

## 2021-06-24 NOTE — Progress Notes (Signed)
  Speech Language Pathology Treatment: Dysphagia  Patient Details Name: Warren Salas MRN: [id number] DOB: August 06, 1961 Today's Date: 06/24/2021 Time: 1540-0867 SLP Time Calculation (min) (ACUTE ONLY): 32 min  Assessment / Plan / Recommendation Clinical Impression  Today pt found sleepy, somnolent but would awaken adequately t He is severely dysarthric with poor intelligibility. When pushed to participate in session, he raised his voice and his speech was clearer as she said "I don't know" when asked why he was slid down in the bed again.    Baseline nonproductive cough on secretions observed but without expectoration despite verbal cues.  Pt appeared xerostomic and thus SLP offered him liquids.  He did state he wanted some "Sprite".  Pt maintained eye closure throughout 90% of the session. He did assist with hand-over-hand to consume nectar thickened lemon lime soda via straw.  Swallow appeared clinically timely - difficult to observe secondary to beard blocking view.  Intake of approximately 6 ounces observed with no immediate indication of airway compromise or significant dysphagia.    Upon lowering HOB per pt request, he experienced prolonged, strong coughing episode requiring at least one minute to recover his breathing.  ? If pt has pharyngeal retention and/or esophageal deficits.  Pt did not appear as if coughing episode was bothersome to him, causing SLP to suspect some baseline dysphagia.  He admits to some baseline coughing with intake but not on secretions or to extent observed today. His mentation appears to be a large barrier to adequate and safe po.    SLP modified diet to include pt consuming liquids VIA TSP ONLY - no cup/straw boluses - with strict precautions.  Given pt is on a modified diet at this time, it will be recommended he follow up with SLP for dysphagia treatment.   Concern is present for pt to obtain adequate nutrition, hydration and tolerate po diet at this time given  ongoing lethargy, poor intake and coughing on secretions.      HPI HPI: Patient is a 60 year old male comes to the hospital with profuse diarrhea as well as weakness/dizziness.  Past medical history of alcohol abuse, homelessness currently living in a tent, COPD, DM 2, CAD, chronic back pain. RN observed coughing with liquids and SLP was ordered to evaluate swallow function.      SLP Plan  Continue with current plan of care       Recommendations  Diet recommendations: Nectar-thick liquid Liquids provided via: Teaspoon Medication Administration: Crushed with puree Supervision: Full supervision/cueing for compensatory strategies Compensations: Minimize environmental distractions;Small sips/bites;Slow rate (hand over hand assistance) Postural Changes and/or Swallow Maneuvers: Seated upright 90 degrees;Upright 30-60 min after meal                Oral Care Recommendations: Oral care BID;Staff/trained caregiver to provide oral care Follow up Recommendations: Other (comment) (TBD) SLP Visit Diagnosis: Dysphagia, unspecified (R13.10) Plan: Continue with current plan of care       GO              Rolena Infante, MS [facility] SLP Acute Rehab Services Office [phone] Pager [phone]   Chales Abrahams 06/24/2021, 3:57 PM

## 2021-06-25 LAB — GLUCOSE, CAPILLARY
Glucose-Capillary: 95 mg/dL (ref 70–99)
Glucose-Capillary: 102 mg/dL — ABNORMAL HIGH (ref 70–99)
Glucose-Capillary: 135 mg/dL — ABNORMAL HIGH (ref 70–99)
Glucose-Capillary: 94 mg/dL (ref 70–99)

## 2021-06-25 LAB — CBC
HCT: 37.2 % — ABNORMAL LOW (ref 39.0–52.0)
Hemoglobin: 12 g/dL — ABNORMAL LOW (ref 13.0–17.0)
MCH: 33.8 pg (ref 26.0–34.0)
MCHC: 32.3 g/dL (ref 30.0–36.0)
MCV: 104.8 fL — ABNORMAL HIGH (ref 80.0–100.0)
Platelets: 105 10*3/uL — ABNORMAL LOW (ref 150–400)
RBC: 3.55 MIL/uL — ABNORMAL LOW (ref 4.22–5.81)
RDW: 18.2 % — ABNORMAL HIGH (ref 11.5–15.5)
WBC: 6.7 10*3/uL (ref 4.0–10.5)
nRBC: 0 % (ref 0.0–0.2)

## 2021-06-25 LAB — BASIC METABOLIC PANEL
Anion gap: 6 (ref 5–15)
BUN: 8 mg/dL (ref 6–20)
CO2: 23 mmol/L (ref 22–32)
Calcium: 8.3 mg/dL — ABNORMAL LOW (ref 8.9–10.3)
Chloride: 105 mmol/L (ref 98–111)
Creatinine, Ser: 0.7 mg/dL (ref 0.61–1.24)
GFR, Estimated: >60 mL/min (ref >60)
Glucose, Bld: 125 mg/dL — ABNORMAL HIGH (ref 70–99)
Potassium: 3.5 mmol/L (ref 3.5–5.1)
Sodium: 134 mmol/L — ABNORMAL LOW (ref 135–145)

## 2021-06-25 LAB — MAGNESIUM: Magnesium: 1.9 mg/dL (ref 1.7–2.4)

## 2021-06-25 MED ORDER — SODIUM CHLORIDE 0.9 % IV SOLN
250.0000 mg | INTRAVENOUS | Status: DC
  Administered 2021-06-26: 250 mg via INTRAVENOUS
  Filled 2021-06-25: qty 2.5

## 2021-06-25 NOTE — Care Management Important Message (Signed)
Important Message  Patient Details IM Letter given to the Patient. Name: Warren Salas MRN: [id number] Date of Birth: Feb 25, 1961   Medicare Important Message Given:  Yes     Caren Macadam 06/25/2021, 10:48 AM

## 2021-06-25 NOTE — Progress Notes (Addendum)
PROGRESS NOTE    Warren Salas  UJW:[id number] DOB: Oct 07, 1961 DOA: 06/16/2021 PCP: Patient, No Pcp Per (Inactive)     Brief Narrative:  Warren ScarletBarton Swift is a 60 year old male with alcohol abuse, homelessness currently living in a tent, COPD, DM 2, CAD, chronic back pain comes to the hospital with profuse diarrhea as well as weakness/dizziness.  He tells me his diarrhea is somewhat chronic going back a few months but in the last few days it has gotten significantly worse with more than 10 watery bowel movements per day.  He is also been extremely weak and dizzy upon standing and walking.  Patient was admitted with alcohol abuse with significant alcohol withdrawal, metabolic encephalopathy.  He also developed tachycardia, SVT versus a flutter.  Patient was started on metoprolol, cardiology consulted.  Due to concern for Wernicke's encephalopathy, patient was started on IV thiamine.  New events last 24 hours / Subjective: Patient much more alert this morning.  [facility] year 2022. He voices no concerns or issues, states that he will go back to his tent when he is released. Per RN, patient has had some intermittent confusion, trying to get out of bed etc.   Assessment & Plan:   Principal Problem:   Dizziness Active Problems:   CAD (coronary artery disease)   Tobacco abuse   Type 2 diabetes mellitus with complication, without long-term current use of insulin (HCC)   Alcohol dependence with alcohol-induced mood disorder (HCC)   Asthma   Depressive disorder   Essential hypertension   Hyponatremia   Pressure injury of skin   Tachycardia   Thrombocytopenia (HCC)   Alcohol abuse with withdrawal -MRI brain degraded by motion but no acute intracranial abnormality identified -Has not received Ativan since 6/28 -Vitamin B1 pending, giving IV replacement due to concern for Wernicke's -B12, folate unremarkable -Check RPR  Tachycardia, atrial  flutter versus SVT -Appreciate cardiology, follow-up outpatient 8/12 -Poor candidate for anticoagulant -Continue metoprolol -TSH within normal limit  Thrombocytopenia -Likely in setting of significant alcohol abuse -Improving -Refer to hematology as outpatient  CAD -Continue aspirin  Diabetes mellitus type 2 -SSI  Diarrhea -C. difficile, GI PCR negative   In agreement with assessment of the pressure ulcer as below:  Pressure Injury 06/17/21 Buttocks Right;Left Stage 2 -  Partial thickness loss of dermis presenting as a shallow open injury with a red, pink wound bed without slough. (Active)  06/17/21 0106  Location: Buttocks  Location Orientation: Right;Left  Staging: Stage 2 -  Partial thickness loss of dermis presenting as a shallow open injury with a red, pink wound bed without slough.  Wound Description (Comments):   Present on Admission: Yes         DVT prophylaxis:  enoxaparin (LOVENOX) injection 40 mg Start: 06/17/21 1000  Code Status:     Code Status Orders  (From admission, onward)           Start     Ordered   06/17/21 0050  Full code  Continuous        06/17/21 0049           Code Status History     Date Active Date Inactive Code Status Order ID Comments User Context   09/21/2019 1001 09/22/2019 0040 Full Code [id number]  Dietrich PatesKhatri, Hina, PA-C ED   06/26/2019 1154 06/29/2019 1355 Full Code [id number]  Hughie ClossPahwani, Ravi, MD Inpatient   03/27/2019 0629 03/27/2019 1927 Full Code [id number]  Hillary Bow, DO ED   12/09/2018 0009 12/09/2018 1404 Full Code [id number]  Emi Holes, PA-C ED   12/05/2018 0515 12/07/2018 1609 Full Code [id number]  Jackelyn Poling, NP Inpatient   12/04/2018 1420 12/05/2018 0429 Full Code [id number]  Samuel Jester, DO ED   11/16/2018 1020 11/22/2018 1818 Full Code [id number]  Kerry Hough, PA-C Inpatient   10/24/2018 0108 10/24/2018 1314 Full Code [id number]  Garlon Hatchet, PA-C ED   12/29/2017 1148 12/29/2017 2110 Full Code  [id number]  Corky Crafts, MD Inpatient   11/01/2017 1140 11/04/2017 1641 Full Code [id number]  Corky Crafts, MD Inpatient      Family Communication: None at bedside  Disposition Plan:  Status is: Inpatient  Remains inpatient appropriate because:Altered mental status  Dispo: The patient is from:  Homeless              Anticipated d/c is to:  Homeless              Patient currently is not medically stable to d/c.   Difficult to place patient No      Antimicrobials:  Anti-infectives (From admission, onward)    None        Objective: Vitals:   06/24/21 1215 06/24/21 2152 06/25/21 0444 06/25/21 1217  BP: 123/66 140/72 128/66 128/63  Pulse: 68 78 73 71  Resp: 20 20 20 14   Temp: 97.6 F (36.4 C) 98.1 F (36.7 C) 98.3 F (36.8 C) 98.1 F (36.7 C)  TempSrc: Axillary Axillary Oral Oral  SpO2: 95% 100% 96% 98%  Weight:      Height:        Intake/Output Summary (Last 24 hours) at 06/25/2021 1227 Last data filed at 06/25/2021 1020 Gross per 24 hour  Intake 290 ml  Output --  Net 290 ml    Filed Weights   06/16/21 1835  Weight: 64 kg   Examination: General exam: Appears calm and comfortable  Respiratory system: Clear to auscultation. Respiratory effort normal. Cardiovascular system: S1 & S2 heard, RRR. No pedal edema. Gastrointestinal system: Abdomen is nondistended, soft and nontender. Normal bowel sounds heard. Central nervous system: Alert and oriented to self place year. Non focal exam. Speech slightly dysarthric but improved from yesterday Extremities: Symmetric in appearance bilaterally   Data Reviewed: I have personally reviewed following labs and imaging studies  CBC: Recent Labs  Lab 06/20/21 0636 06/23/21 0414 06/24/21 0821 06/25/21 0450  WBC 6.8 6.5 6.2 6.7  HGB 12.4* 12.6* 12.3* 12.0*  HCT 37.1* 37.6* 37.2* 37.2*  MCV 102.2* 102.5* 102.8* 104.8*  PLT 67* 82* 92* 105*    Basic Metabolic Panel: Recent Labs  Lab 06/20/21 0636  06/23/21 0414 06/25/21 0450  NA 132* 135 134*  K 3.4* 3.5 3.5  CL 99 102 105  CO2 25 24 23   GLUCOSE 103* 83 125*  BUN 5* 7 8  CREATININE 0.53* 0.55* 0.70  CALCIUM 8.4* 8.4* 8.3*  MG  --   --  1.9    GFR: Estimated Creatinine Clearance: 88.6 mL/min (by C-G formula based on SCr of 0.7 mg/dL). Liver Function Tests: Recent Labs  Lab 06/20/21 0636 06/23/21 0414  AST 53* 41  ALT 20 17  ALKPHOS 106 88  BILITOT 3.2* 3.2*  PROT 6.6 6.8  ALBUMIN 2.9* 3.0*    No results for input(s): LIPASE, AMYLASE in the last 168 hours. No results for input(s): AMMONIA in the last 168 hours. Coagulation Profile: No results for input(s): INR,  PROTIME in the last 168 hours. Cardiac Enzymes: No results for input(s): CKTOTAL, CKMB, CKMBINDEX, TROPONINI in the last 168 hours. BNP (last 3 results) No results for input(s): PROBNP in the last 8760 hours. HbA1C: No results for input(s): HGBA1C in the last 72 hours. CBG: Recent Labs  Lab 06/24/21 1109 06/24/21 1606 06/24/21 2141 06/25/21 0721 06/25/21 1214  GLUCAP 71 115* 114* 95 102*    Lipid Profile: No results for input(s): CHOL, HDL, LDLCALC, TRIG, CHOLHDL, LDLDIRECT in the last 72 hours. Thyroid Function Tests: Recent Labs    06/24/21 0821  TSH 3.303    Anemia Panel: Recent Labs    06/24/21 1559  VITAMINB12 930*  FOLATE 17.1   Sepsis Labs: No results for input(s): PROCALCITON, LATICACIDVEN in the last 168 hours.  Recent Results (from the past 240 hour(s))  Resp Panel by RT-PCR (Flu A&B, Covid) Nasopharyngeal Swab     Status: None   Collection Time: 06/16/21 11:25 PM   Specimen: Nasopharyngeal Swab; Nasopharyngeal(NP) swabs in vial transport medium  Result Value Ref Range Status   SARS Coronavirus 2 by RT PCR NEGATIVE NEGATIVE Final    Comment: (NOTE) SARS-CoV-2 target nucleic acids are NOT DETECTED.  The SARS-CoV-2 RNA is generally detectable in upper respiratory specimens during the acute phase of infection. The  lowest concentration of SARS-CoV-2 viral copies this assay can detect is 138 copies/mL. A negative result does not preclude SARS-Cov-2 infection and should not be used as the sole basis for treatment or other patient management decisions. A negative result may occur with  improper specimen collection/handling, submission of specimen other than nasopharyngeal swab, presence of viral mutation(s) within the areas targeted by this assay, and inadequate number of viral copies(<138 copies/mL). A negative result must be combined with clinical observations, patient history, and epidemiological information. The expected result is Negative.  Fact Sheet for Patients:  BloggerCourse.com  Fact Sheet for Healthcare Providers:  SeriousBroker.it  This test is no t yet approved or cleared by the [place] FDA and  has been authorized for detection and/or diagnosis of SARS-CoV-2 by FDA under an Emergency Use Authorization (EUA). This EUA will remain  in effect (meaning this test can be used) for the duration of the COVID-19 declaration under Section 564(b)(1) of the Act, 21 U.S.C.section 360bbb-3(b)(1), unless the authorization is terminated  or revoked sooner.       Influenza A by PCR NEGATIVE NEGATIVE Final   Influenza B by PCR NEGATIVE NEGATIVE Final    Comment: (NOTE) The Xpert Xpress SARS-CoV-2/FLU/RSV plus assay is intended as an aid in the diagnosis of influenza from Nasopharyngeal swab specimens and should not be used as a sole basis for treatment. Nasal washings and aspirates are unacceptable for Xpert Xpress SARS-CoV-2/FLU/RSV testing.  Fact Sheet for Patients: BloggerCourse.com  Fact Sheet for Healthcare Providers: SeriousBroker.it  This test is not yet approved or cleared by the [place] FDA and has been authorized for detection and/or diagnosis of SARS-CoV-2 by FDA under  an Emergency Use Authorization (EUA). This EUA will remain in effect (meaning this test can be used) for the duration of the COVID-19 declaration under Section 564(b)(1) of the Act, 21 U.S.C. section 360bbb-3(b)(1), unless the authorization is terminated or revoked.  Performed at [facility], [address], [place], [place] [address]   C Difficile Quick Screen w PCR reflex     Status: None   Collection Time: 06/17/21  9:20 AM   Specimen: STOOL  Result Value Ref Range Status  C Diff antigen NEGATIVE NEGATIVE Final   C Diff toxin NEGATIVE NEGATIVE Final   C Diff interpretation No C. difficile detected.  Final    Comment: Performed at [facility], [address] [place], [place] [address]  Gastrointestinal Panel by PCR , Stool     Status: None   Collection Time: 06/17/21  9:20 AM   Specimen: STOOL  Result Value Ref Range Status   Campylobacter species NOT DETECTED NOT DETECTED Final   Plesimonas shigelloides NOT DETECTED NOT DETECTED Final   Salmonella species NOT DETECTED NOT DETECTED Final   Yersinia enterocolitica NOT DETECTED NOT DETECTED Final   Vibrio species NOT DETECTED NOT DETECTED Final   Vibrio cholerae NOT DETECTED NOT DETECTED Final   Enteroaggregative E coli (EAEC) NOT DETECTED NOT DETECTED Final   Enteropathogenic E coli (EPEC) NOT DETECTED NOT DETECTED Final   Enterotoxigenic E coli (ETEC) NOT DETECTED NOT DETECTED Final   Shiga like toxin producing E coli (STEC) NOT DETECTED NOT DETECTED Final   Shigella/Enteroinvasive E coli (EIEC) NOT DETECTED NOT DETECTED Final   Cryptosporidium NOT DETECTED NOT DETECTED Final   Cyclospora cayetanensis NOT DETECTED NOT DETECTED Final   Entamoeba histolytica NOT DETECTED NOT DETECTED Final   Giardia lamblia NOT DETECTED NOT DETECTED Final   Adenovirus F40/41 NOT DETECTED NOT DETECTED Final   Astrovirus NOT DETECTED NOT DETECTED Final   Norovirus GI/GII NOT DETECTED NOT DETECTED Final    Rotavirus A NOT DETECTED NOT DETECTED Final   Sapovirus (I, II, IV, and V) NOT DETECTED NOT DETECTED Final    Comment: Performed at [facility], [address], [place], [place] [address]       Radiology Studies: No results found.    Scheduled Meds:  aspirin EC  81 mg Oral Daily   enoxaparin (LOVENOX) injection  40 mg Subcutaneous Q24H   folic acid  1 mg Oral Daily   Gerhardt's butt cream   Topical QID   insulin aspart  0-15 Units Subcutaneous TID WC   insulin aspart  0-5 Units Subcutaneous QHS   metoprolol tartrate  50 mg Oral BID   multivitamin with minerals  1 tablet Oral Daily   nicotine  21 mg Transdermal Daily   Continuous Infusions:  thiamine injection 500 mg (06/25/21 0657)     LOS: 8 days      Time spent: 25 minutes   Noralee Stain, DO [facility] 06/25/2021, 12:27 PM   Available via Epic secure chat 7am-7pm After these hours, please refer to coverage provider listed on [email or web address]

## 2021-06-25 NOTE — Plan of Care (Signed)

## 2021-06-25 NOTE — Progress Notes (Addendum)
  Speech Language Pathology Treatment: Dysphagia  Patient Details Name: Warren Salas MRN: [id number] DOB: 18-Aug-1961 Today's Date: 06/25/2021 Time: 4034-7425 SLP Time Calculation (min) (ACUTE ONLY): 26 min  Assessment / Plan / Recommendation Clinical Impression  Today pt with skilled SLP treatment for dysphagia management.  Pt is much more alert today and able to discuss his swallow dysfunction.  Upon discussion with him, he reports problems with "choking" on fluids more than food for "years".   He denies this worsening at this time and states "this is just how it is".  He reports incidents of nasal regurgitation of liquids that cause "burning".  Reports more issues with coughing AFTER meals than during.  Suspect pt has a multifactorial dysphagia (admits to reflux issues) and has been managing due to his mobility and cough strength.    When inquired re: fellow diners response to his excessive coughing with eating - to which he states "They say that I sound like I'm going to die".    Denies requiring heimlich maneuver nor having recurrent pneumonias.  Admits to weight loss but states this is due to access to food and not dysphagia.  Pt admits that if he has access to food, he feels he would be able to manage nutrition without difficulties.    Nectar thickened drinks are easier for pt to swallow - per his report.  He continues to cough after minimal intake with this SLP.  Advised he consume drinks like Ensure if decreases cough and improves his comfort, if accessible after dc from hospital.  If MD desires, instrumental testing of swallowing could be conducted *ie MBS and esophagram - however given pt report of chronicity of deficits without acute worsening and tolerance, uncertain that it will change outcome.    Educated pt to use of oral suction using teach back, demonstration and importance to maintain strength of cough and "hock" for airway protection using teach back and mod cues.  Recommend pt  follow up with MD if dysphagia worsens, he has weight loss or recurrent pneumonias. Marland Kitchen    HPI HPI: Patient is a 60 year old male comes to the hospital with profuse diarrhea as well as weakness/dizziness.  Past medical history of alcohol abuse, homelessness currently living in a tent, COPD, DM 2, CAD, chronic back pain. RN observed coughing with liquids and SLP was ordered to evaluate swallow function.  Pt's diet has been modified to mitigate aspiration/dysphagia but he continues to cough - not as bad when mentation is improved obviously.      SLP Plan  Continue with current plan of care       Recommendations  Diet recommendations: Nectar-thick liquid Liquids provided via: Teaspoon Medication Administration: Crushed with puree Supervision: Full supervision/cueing for compensatory strategies Compensations: Minimize environmental distractions;Small sips/bites;Slow rate (hand over hand assistance) Postural Changes and/or Swallow Maneuvers: Upright 30-60 min after meal;Seated upright 90 degrees                Oral Care Recommendations: Oral care BID;Staff/trained caregiver to provide oral care Follow up Recommendations: Other (comment) (TBD) SLP Visit Diagnosis: Dysphagia, unspecified (R13.10) Plan: Continue with current plan of care       GO               Rolena Infante, MS [facility] SLP Acute Rehab Services Office [phone] Pager [phone]  Chales Abrahams 06/25/2021, 5:05 PM

## 2021-06-26 ENCOUNTER — Other Ambulatory Visit (HOSPITAL_COMMUNITY): Payer: Self-pay

## 2021-06-26 LAB — RPR: RPR Ser Ql: NONREACTIVE

## 2021-06-26 LAB — GLUCOSE, CAPILLARY: Glucose-Capillary: 96 mg/dL (ref 70–99)

## 2021-06-26 MED ORDER — THIAMINE HCL 100 MG PO TABS
100.0000 mg | ORAL_TABLET | Freq: Every day | ORAL | 2 refills | Status: AC
  Filled 2021-06-26: qty 30, 30d supply, fill #0

## 2021-06-26 MED ORDER — METOPROLOL TARTRATE 50 MG PO TABS
50.0000 mg | ORAL_TABLET | Freq: Two times a day (BID) | ORAL | 2 refills | Status: AC
  Filled 2021-06-26: qty 60, 30d supply, fill #0

## 2021-06-26 MED ORDER — ASPIRIN 81 MG PO TBEC
81.0000 mg | DELAYED_RELEASE_TABLET | Freq: Every day | ORAL | 2 refills | Status: AC
  Filled 2021-06-26: qty 30, 30d supply, fill #0

## 2021-06-26 NOTE — Plan of Care (Signed)
  Problem: Coping: Goal: Level of anxiety will decrease Outcome: Progressing   Problem: Elimination: Goal: Will not experience complications related to bowel motility Outcome: Progressing Goal: Will not experience complications related to urinary retention Outcome: Progressing   Problem: Pain Managment: Goal: General experience of comfort will improve Outcome: Progressing   Problem: Safety: Goal: Ability to remain free from injury will improve Outcome: Progressing   Problem: Skin Integrity: Goal: Risk for impaired skin integrity will decrease Outcome: Progressing   Problem: Safety: Goal: Non-violent Restraint(s) Outcome: Progressing   

## 2021-06-26 NOTE — Discharge Summary (Addendum)
Physician Discharge Summary  Warren Salas ZOX:[id number] DOB: 07-16-1961 DOA: 06/16/2021  PCP: Patient, No Pcp Per (Inactive)  Admit date: 06/16/2021 Discharge date: 06/26/2021  Admitted From: Homeless Disposition:  Homeless   Recommendations for Outpatient Follow-up:  Follow up with final thiamine and RPR level, pending at discharge Follow up with Cardiology as below Referred to outpatient hematology regarding thrombocytopenia   Discharge Condition: Improved CODE STATUS: Full  Diet recommendation:  Diet Orders (From admission, onward)     Start     Ordered   06/22/21 1220  DIET DYS 2 Room service appropriate? Yes; Fluid consistency: Nectar Thick  Diet effective now       Question Answer Comment  Room service appropriate? Yes   Fluid consistency: Nectar Thick      06/22/21 1219            Brief/Interim Summary: Warren Salas is a 60 year old male with alcohol abuse, homelessness currently living in a tent, COPD, DM 2, CAD, chronic back pain comes to the hospital with profuse diarrhea as well as weakness/dizziness.  He tells me his diarrhea is somewhat chronic going back a few months but in the last few days it has gotten significantly worse with more than 10 watery bowel movements per day.  He is also been extremely weak and dizzy upon standing and walking.   Patient was admitted with alcohol abuse with significant alcohol withdrawal, metabolic encephalopathy.  He also developed tachycardia, SVT versus a flutter.  Patient was started on metoprolol, cardiology consulted.  Due to concern for Wernicke's encephalopathy, patient was started on IV thiamine.  Patient's HR stabilized and remained in NSR. His mentation also continued to improve. He was eager to discharge from hospital, remained oriented to self, place, year, situation. He stated he has a friend who was going to drive him to an apartment where he is supposed to be getting housing this month, and if he stayed in the  hospital, he would lose his spot.   Discharge Diagnoses:  Principal Problem:   Dizziness Active Problems:   CAD (coronary artery disease)   Tobacco abuse   Type 2 diabetes mellitus with complication, without long-term current use of insulin (HCC)   Alcohol dependence with alcohol-induced mood disorder (HCC)   Asthma   Depressive disorder   Essential hypertension   Hyponatremia   Pressure injury of skin   Tachycardia   Thrombocytopenia (HCC)   Alcohol abuse with withdrawal -MRI brain degraded by motion but no acute intracranial abnormality identified -Has not received Ativan since 6/28 -Vitamin B1 pending, giving IV replacement due to concern for Wernicke's -B12, folate unremarkable -RPR pending    Tachycardia, atrial flutter versus SVT -Appreciate cardiology, follow-up outpatient 8/12 -Poor candidate for anticoagulant -Continue metoprolol -TSH within normal limit   Thrombocytopenia -Likely in setting of significant alcohol abuse -Referred to hematology as outpatient  CAD -Continue aspirin   Diabetes mellitus type 2 -Ha1c 5.4    Diarrhea -C. difficile, GI PCR negative   In agreement with assessment of the pressure ulcer as below:  Pressure Injury 06/17/21 Buttocks Right;Left Stage 2 -  Partial thickness loss of dermis presenting as a shallow open injury with a red, pink wound bed without slough. (Active)  06/17/21 0106  Location: Buttocks  Location Orientation: Right;Left  Staging: Stage 2 -  Partial thickness loss of dermis presenting as a shallow open injury with a red, pink wound bed without slough.  Wound Description (Comments):   Present on Admission: Yes  Discharge Instructions  Discharge Instructions     Ambulatory referral to Hematology / Oncology   Complete by: As directed    Call MD for:  difficulty breathing, headache or visual disturbances   Complete by: As directed    Call MD for:  extreme fatigue   Complete by: As directed    Call  MD for:  persistant dizziness or light-headedness   Complete by: As directed    Call MD for:  persistant nausea and vomiting   Complete by: As directed    Call MD for:  severe uncontrolled pain   Complete by: As directed    Call MD for:  temperature >100.4   Complete by: As directed    Discharge instructions   Complete by: As directed    You were cared for by a hospitalist during your hospital stay. If you have any questions about your discharge medications or the care you received while you were in the hospital after you are discharged, you can call the unit and ask to speak with the hospitalist on call if the hospitalist that took care of you is not available. Once you are discharged, your primary care physician will handle any further medical issues. Please note that NO REFILLS for any discharge medications will be authorized once you are discharged, as it is imperative that you return to your primary care physician (or establish a relationship with a primary care physician if you do not have one) for your aftercare needs so that they can reassess your need for medications and monitor your lab values.   Discharge wound care:   Complete by: As directed    Wound and skin care to bilateral buttocks, perineum, scrotum and medial thighs:  Cleanse gently with soap and warm tap water, rinse and pat dry. Apply Gerhart's Butt Cream in a thin layer to affected areas.  Position patient from side to side and minimize time in the supine position. Place a pillow between knees to promote airflow to affected areas. Use only DermaTherapy under pads beneath patient.   Increase activity slowly   Complete by: As directed       Allergies as of 06/26/2021   No Known Allergies      Medication List     STOP taking these medications    amoxicillin-clavulanate 875-125 MG tablet Commonly known as: AUGMENTIN   lidocaine 2 % jelly Commonly known as: XYLOCAINE   nitroGLYCERIN 0.4 MG SL tablet Commonly known  as: Nitrostat   nystatin ointment Commonly known as: MYCOSTATIN       TAKE these medications    aspirin 81 MG EC tablet Take 1 tablet (81 mg total) by mouth daily. Swallow whole.   metoprolol tartrate 50 MG tablet Commonly known as: LOPRESSOR Take 1 tablet (50 mg total) by mouth 2 (two) times daily.   thiamine 100 MG tablet Take 1 tablet (100 mg total) by mouth daily.               Discharge Care Instructions  (From admission, onward)           Start     Ordered   06/26/21 0000  Discharge wound care:       Comments: Wound and skin care to bilateral buttocks, perineum, scrotum and medial thighs:  Cleanse gently with soap and warm tap water, rinse and pat dry. Apply Gerhart's Butt Cream in a thin layer to affected areas.  Position patient from side to side and minimize time in the  supine position. Place a pillow between knees to promote airflow to affected areas. Use only DermaTherapy under pads beneath patient.   06/26/21 0825            Follow-up Information     Dunn, Dayna N, PA-C Follow up.   Specialties: Cardiology, Radiology Why: [facility] - [address] location - a follow-up has been arranged for you on Friday Aug 07, 2021 11:15 AM (Arrive by 11:00 AM). Kriste Basque is one of the PAs that works with Dr. Eldridge Dace and our cardiology team. Contact information: [address] [place] [address] [phone]                No Known Allergies  Consultations: Cardiology    Procedures/Studies: CT Head Wo Contrast  Result Date: 06/16/2021 CLINICAL DATA:  Head trauma. Status post fall. Hit back of head on concrete. EXAM: CT HEAD WITHOUT CONTRAST TECHNIQUE: Contiguous axial images were obtained from the base of the skull through the vertex without intravenous contrast. COMPARISON:  03/24/2021 FINDINGS: Brain: No evidence of acute infarction, hemorrhage, hydrocephalus, extra-axial collection or mass lesion/mass effect. Prominence  of the sulci and ventricles identified compatible with brain atrophy. There is mild diffuse low-attenuation within the subcortical and periventricular white matter compatible with chronic microvascular disease. Vascular: No hyperdense vessel or unexpected calcification. Skull: Normal. Negative for fracture or focal lesion. Sinuses/Orbits: There is moderate mucosal thickening involving the visualized portions of the maxillary sinuses and ethmoid air cells. Mastoid air cells are clear. Other: None IMPRESSION: 1. No acute intracranial abnormalities. 2. Chronic small vessel ischemic disease and brain atrophy. 3. Sinus inflammation. Electronically Signed   By: Signa Kell M.D.   On: 06/16/2021 20:35   MR BRAIN WO CONTRAST  Result Date: 06/22/2021 CLINICAL DATA:  Mental status change, unknown cause. EXAM: MRI HEAD WITHOUT CONTRAST TECHNIQUE: Multiplanar, multiecho pulse sequences of the brain and surrounding structures were obtained without intravenous contrast. COMPARISON:  MRI of the brain June 17, 2021 FINDINGS: The study is degraded by motion and incomplete. Brain: No acute infarction, large hemorrhage, hydrocephalus, extra-axial collection or mass lesion. Moderate parenchymal volume loss. Vascular: Normal flow voids. Skull and upper cervical spine: Normal marrow signal. Sinuses/Orbits: Interval improvement of maxillary sinusitis. The orbits are maintained. IMPRESSION: 1. Incomplete study, degraded by motion. 2. No acute intracranial abnormality identified. Electronically Signed   By: Baldemar Lenis M.D.   On: 06/22/2021 11:38   MR BRAIN WO CONTRAST  Result Date: 06/17/2021 CLINICAL DATA:  TIA. EXAM: MRI HEAD WITHOUT CONTRAST TECHNIQUE: Multiplanar, multiecho pulse sequences of the brain and surrounding structures were obtained without intravenous contrast. COMPARISON:  Head CT from earlier today FINDINGS: Brain: No acute infarction, hemorrhage, hydrocephalus, extra-axial collection or mass  lesion. Premature brain atrophy which is generalized. Small remote cortically based infarct at the left frontal parietal junction. Vascular: Normal flow voids Skull and upper cervical spine: Normal marrow signal Sinuses/Orbits: Prominent mucosal thickening in the bilateral maxillary sinuses. Milder ethmoid sinusitis. IMPRESSION: 1. No acute finding including infarct. 2. Brain atrophy.  Small remote left frontal parietal infarcts. 3. Generalized sinusitis. Electronically Signed   By: Marnee Spring M.D.   On: 06/17/2021 06:52   DG CHEST PORT 1 VIEW  Result Date: 06/21/2021 CLINICAL DATA:  Dyspnea. EXAM: PORTABLE CHEST 1 VIEW COMPARISON:  06/16/2021 FINDINGS: Cardiac silhouette is normal in size and configuration. No mediastinal or hilar masses. Mild prominence of the bronchovascular markings. Lungs otherwise clear. No convincing pleural effusion or pneumothorax.  Skeletal structures are grossly intact. IMPRESSION: No active disease. Electronically Signed   By: Amie Portland M.D.   On: 06/21/2021 10:50   DG Chest Portable 1 View  Result Date: 06/16/2021 CLINICAL DATA:  Cough EXAM: PORTABLE CHEST 1 VIEW COMPARISON:  02/08/2020 FINDINGS: Borderline cardiomegaly. Both lungs are clear. The visualized skeletal structures are unremarkable. IMPRESSION: No active disease. Electronically Signed   By: Jasmine Pang M.D.   On: 06/16/2021 19:55   ECHOCARDIOGRAM COMPLETE  Result Date: 06/21/2021    ECHOCARDIOGRAM REPORT   Patient Name:   Warren Salas Date of Exam: 06/21/2021 Medical Rec #:  [id number]      Height:       66.0 in Accession #:    [id number]     Weight:       141.1 lb Date of Birth:  [birth year]      BSA:          1.724 m Patient Age:    60 years       BP:           134/75 mmHg Patient Gender: M              HR:           73 bpm. Exam Location:  Inpatient Procedure: 2D Echo, Cardiac Doppler and Color Doppler Indications:    R94.31 Abnormal EKG  History:        Patient has prior history of Echocardiogram  examinations, most                 recent 11/02/2017. Previous Myocardial Infarction, COPD; Risk                 Factors:Hypertension and Diabetes.  Sonographer:    Elmarie Shiley Dance Referring Phys: [id number] Daylene Katayama GHERGHE IMPRESSIONS  1. Left ventricular ejection fraction, by estimation, is 60 to 65%. The left ventricle has normal function. The left ventricle has no regional wall motion abnormalities. Left ventricular diastolic parameters were normal.  2. Right ventricular systolic function is normal. The right ventricular size is normal. Tricuspid regurgitation signal is inadequate for assessing PA pressure.  3. The mitral valve is normal in structure. Trivial mitral valve regurgitation. No evidence of mitral stenosis.  4. The aortic valve is tricuspid. Aortic valve regurgitation is not visualized. Mild to moderate aortic valve sclerosis/calcification is present, without any evidence of aortic stenosis.  5. The inferior vena cava is normal in size with greater than 50% respiratory variability, suggesting right atrial pressure of 3 mmHg. FINDINGS  Left Ventricle: Left ventricular ejection fraction, by estimation, is 60 to 65%. The left ventricle has normal function. The left ventricle has no regional wall motion abnormalities. The left ventricular internal cavity size was normal in size. There is  no left ventricular hypertrophy. Left ventricular diastolic parameters were normal. Right Ventricle: The right ventricular size is normal. No increase in right ventricular wall thickness. Right ventricular systolic function is normal. Tricuspid regurgitation signal is inadequate for assessing PA pressure. Left Atrium: Left atrial size was normal in size. Right Atrium: Right atrial size was normal in size. Pericardium: There is no evidence of pericardial effusion. Mitral Valve: The mitral valve is normal in structure. Trivial mitral valve regurgitation. No evidence of mitral valve stenosis. Tricuspid Valve: The tricuspid valve is  normal in structure. Tricuspid valve regurgitation is trivial. Aortic Valve: The aortic valve is tricuspid. Aortic valve regurgitation is not visualized. Mild to moderate aortic valve sclerosis/calcification is present, without any evidence  of aortic stenosis. Pulmonic Valve: The pulmonic valve was not well visualized. Pulmonic valve regurgitation is not visualized. Aorta: The aortic root and ascending aorta are structurally normal, with no evidence of dilitation. Venous: The inferior vena cava is normal in size with greater than 50% respiratory variability, suggesting right atrial pressure of 3 mmHg. IAS/Shunts: The interatrial septum was not well visualized.  LEFT VENTRICLE PLAX 2D LVIDd:         4.30 cm  Diastology LVIDs:         3.90 cm  LV e' medial:    8.49 cm/s LV PW:         1.00 cm  LV E/e' medial:  13.4 LV IVS:        1.00 cm  LV e' lateral:   10.30 cm/s LVOT diam:     2.00 cm  LV E/e' lateral: 11.1 LV SV:         66 LV SV Index:   38 LVOT Area:     3.14 cm  RIGHT VENTRICLE             IVC RV Basal diam:  2.70 cm     IVC diam: 1.90 cm RV S prime:     10.00 cm/s TAPSE (M-mode): 1.8 cm LEFT ATRIUM             Index       RIGHT ATRIUM           Index LA diam:        4.10 cm 2.38 cm/m  RA Area:     14.00 cm LA Vol (A2C):   57.4 ml 33.29 ml/m RA Volume:   32.20 ml  18.68 ml/m LA Vol (A4C):   53.2 ml 30.85 ml/m LA Biplane Vol: 55.2 ml 32.01 ml/m  AORTIC VALVE LVOT Vmax:   105.00 cm/s LVOT Vmean:  65.500 cm/s LVOT VTI:    0.211 m  AORTA Ao Root diam: 3.10 cm Ao Asc diam:  3.15 cm MITRAL VALVE MV Area (PHT): 3.85 cm     SHUNTS MV Decel Time: 197 msec     Systemic VTI:  0.21 m MV E velocity: 114.00 cm/s  Systemic Diam: 2.00 cm MV A velocity: 96.70 cm/s MV E/A ratio:  1.18 Epifanio Lesches MD Electronically signed by Epifanio Lesches MD Signature Date/Time: 06/21/2021/1:17:29 PM    Final    US SCROTUM W/DOPPLER  Result Date: 06/04/2021 CLINICAL DATA:  Testicular pain. EXAM: SCROTAL ULTRASOUND  DOPPLER ULTRASOUND OF THE TESTICLES TECHNIQUE: Complete ultrasound examination of the testicles, epididymis, and other scrotal structures was performed. Color and spectral Doppler ultrasound were also utilized to evaluate blood flow to the testicles. COMPARISON:  None. FINDINGS: Right testicle Measurements: 3.3 cm x 2.0 cm x 1.8 cm. No mass or microlithiasis visualized. Left testicle Measurements: 3.5 cm x 1.8 cm x 2.2 cm. No mass or microlithiasis visualized. Right epididymis:  Normal in size and appearance. Left epididymis:  Normal in size and appearance. Hydrocele:  Small bilateral hydroceles are seen. Varicocele:  None visualized. Pulsed Doppler interrogation of both testes demonstrates normal low resistance arterial and venous waveforms bilaterally. Other: Thickened scrotal skin is noted, as per the ultrasound technologist. IMPRESSION: 1. Small bilateral hydroceles. Electronically Signed   By: Aram Candela M.D.   On: 06/04/2021 23:36       Discharge Exam: Vitals:   06/25/21 2224 06/26/21 0602  BP: 135/71 130/70  Pulse: 78 66  Resp: 20 18  Temp: 98.3 F (36.8 C)  98.2 F (36.8 C)  SpO2: 98% 97%    General: Pt is alert, awake, not in acute distress Cardiovascular: RRR, S1/S2 +, no edema Respiratory: CTA bilaterally, no wheezing, no rhonchi, no respiratory distress, no conversational dyspnea  Abdominal: Soft, NT, ND, bowel sounds + Extremities: no edema, no cyanosis Psych: Normal mood and affect, stable judgement and insight, oriented x 4     The results of significant diagnostics from this hospitalization (including imaging, microbiology, ancillary and laboratory) are listed below for reference.     Microbiology: Recent Results (from the past 240 hour(s))  Resp Panel by RT-PCR (Flu A&B, Covid) Nasopharyngeal Swab     Status: None   Collection Time: 06/16/21 11:25 PM   Specimen: Nasopharyngeal Swab; Nasopharyngeal(NP) swabs in vial transport medium  Result Value Ref Range  Status   SARS Coronavirus 2 by RT PCR NEGATIVE NEGATIVE Final    Comment: (NOTE) SARS-CoV-2 target nucleic acids are NOT DETECTED.  The SARS-CoV-2 RNA is generally detectable in upper respiratory specimens during the acute phase of infection. The lowest concentration of SARS-CoV-2 viral copies this assay can detect is 138 copies/mL. A negative result does not preclude SARS-Cov-2 infection and should not be used as the sole basis for treatment or other patient management decisions. A negative result may occur with  improper specimen collection/handling, submission of specimen other than nasopharyngeal swab, presence of viral mutation(s) within the areas targeted by this assay, and inadequate number of viral copies(<138 copies/mL). A negative result must be combined with clinical observations, patient history, and epidemiological information. The expected result is Negative.  Fact Sheet for Patients:  BloggerCourse.com  Fact Sheet for Healthcare Providers:  SeriousBroker.it  This test is no t yet approved or cleared by the [place] FDA and  has been authorized for detection and/or diagnosis of SARS-CoV-2 by FDA under an Emergency Use Authorization (EUA). This EUA will remain  in effect (meaning this test can be used) for the duration of the COVID-19 declaration under Section 564(b)(1) of the Act, 21 U.S.C.section 360bbb-3(b)(1), unless the authorization is terminated  or revoked sooner.       Influenza A by PCR NEGATIVE NEGATIVE Final   Influenza B by PCR NEGATIVE NEGATIVE Final    Comment: (NOTE) The Xpert Xpress SARS-CoV-2/FLU/RSV plus assay is intended as an aid in the diagnosis of influenza from Nasopharyngeal swab specimens and should not be used as a sole basis for treatment. Nasal washings and aspirates are unacceptable for Xpert Xpress SARS-CoV-2/FLU/RSV testing.  Fact Sheet for  Patients: BloggerCourse.com  Fact Sheet for Healthcare Providers: SeriousBroker.it  This test is not yet approved or cleared by the [place] FDA and has been authorized for detection and/or diagnosis of SARS-CoV-2 by FDA under an Emergency Use Authorization (EUA). This EUA will remain in effect (meaning this test can be used) for the duration of the COVID-19 declaration under Section 564(b)(1) of the Act, 21 U.S.C. section 360bbb-3(b)(1), unless the authorization is terminated or revoked.  Performed at [facility], [address], [place], [place] [address]   C Difficile Quick Screen w PCR reflex     Status: None   Collection Time: 06/17/21  9:20 AM   Specimen: STOOL  Result Value Ref Range Status   C Diff antigen NEGATIVE NEGATIVE Final   C Diff toxin NEGATIVE NEGATIVE Final   C Diff interpretation No C. difficile detected.  Final    Comment: Performed at [facility], [address] [place], [place]  [address]  Gastrointestinal Panel by PCR , Stool     Status: None   Collection Time: 06/17/21  9:20 AM   Specimen: STOOL  Result Value Ref Range Status   Campylobacter species NOT DETECTED NOT DETECTED Final   Plesimonas shigelloides NOT DETECTED NOT DETECTED Final   Salmonella species NOT DETECTED NOT DETECTED Final   Yersinia enterocolitica NOT DETECTED NOT DETECTED Final   Vibrio species NOT DETECTED NOT DETECTED Final   Vibrio cholerae NOT DETECTED NOT DETECTED Final   Enteroaggregative E coli (EAEC) NOT DETECTED NOT DETECTED Final   Enteropathogenic E coli (EPEC) NOT DETECTED NOT DETECTED Final   Enterotoxigenic E coli (ETEC) NOT DETECTED NOT DETECTED Final   Shiga like toxin producing E coli (STEC) NOT DETECTED NOT DETECTED Final   Shigella/Enteroinvasive E coli (EIEC) NOT DETECTED NOT DETECTED Final   Cryptosporidium NOT DETECTED NOT DETECTED Final   Cyclospora cayetanensis NOT  DETECTED NOT DETECTED Final   Entamoeba histolytica NOT DETECTED NOT DETECTED Final   Giardia lamblia NOT DETECTED NOT DETECTED Final   Adenovirus F40/41 NOT DETECTED NOT DETECTED Final   Astrovirus NOT DETECTED NOT DETECTED Final   Norovirus GI/GII NOT DETECTED NOT DETECTED Final   Rotavirus A NOT DETECTED NOT DETECTED Final   Sapovirus (I, II, IV, and V) NOT DETECTED NOT DETECTED Final    Comment: Performed at [facility], [address] [place], [place] [address]     Labs: BNP (last 3 results) No results for input(s): BNP in the last 8760 hours. Basic Metabolic Panel: Recent Labs  Lab 06/20/21 0636 06/23/21 0414 06/25/21 0450  NA 132* 135 134*  K 3.4* 3.5 3.5  CL 99 102 105  CO2 25 24 23   GLUCOSE 103* 83 125*  BUN 5* 7 8  CREATININE 0.53* 0.55* 0.70  CALCIUM 8.4* 8.4* 8.3*  MG  --   --  1.9   Liver Function Tests: Recent Labs  Lab 06/20/21 0636 06/23/21 0414  AST 53* 41  ALT 20 17  ALKPHOS 106 88  BILITOT 3.2* 3.2*  PROT 6.6 6.8  ALBUMIN 2.9* 3.0*   No results for input(s): LIPASE, AMYLASE in the last 168 hours. No results for input(s): AMMONIA in the last 168 hours. CBC: Recent Labs  Lab 06/20/21 0636 06/23/21 0414 06/24/21 0821 06/25/21 0450  WBC 6.8 6.5 6.2 6.7  HGB 12.4* 12.6* 12.3* 12.0*  HCT 37.1* 37.6* 37.2* 37.2*  MCV 102.2* 102.5* 102.8* 104.8*  PLT 67* 82* 92* 105*   Cardiac Enzymes: No results for input(s): CKTOTAL, CKMB, CKMBINDEX, TROPONINI in the last 168 hours. BNP: Invalid input(s): POCBNP CBG: Recent Labs  Lab 06/25/21 0721 06/25/21 1214 06/25/21 1642 06/25/21 2226 06/26/21 0804  GLUCAP 95 102* 135* 94 96   D-Dimer No results for input(s): DDIMER in the last 72 hours. Hgb A1c No results for input(s): HGBA1C in the last 72 hours. Lipid Profile No results for input(s): CHOL, HDL, LDLCALC, TRIG, CHOLHDL, LDLDIRECT in the last 72 hours. Thyroid function studies Recent Labs    06/24/21 0821  TSH 3.303    Anemia work up Recent Labs    06/24/21 1559  VITAMINB12 930*  FOLATE 17.1   Urinalysis    Component Value Date/Time   COLORURINE STRAW (A) 02/08/2020 2140   APPEARANCEUR CLEAR 02/08/2020 2140   LABSPEC 1.002 (L) 02/08/2020 2140   PHURINE 6.0 02/08/2020 2140   GLUCOSEU NEGATIVE 02/08/2020 2140   HGBUR NEGATIVE 02/08/2020 2140   BILIRUBINUR NEGATIVE 02/08/2020 2140  KETONESUR NEGATIVE 02/08/2020 2140   PROTEINUR NEGATIVE 02/08/2020 2140   NITRITE NEGATIVE 02/08/2020 2140   LEUKOCYTESUR NEGATIVE 02/08/2020 2140   Sepsis Labs Invalid input(s): PROCALCITONIN,  WBC,  LACTICIDVEN Microbiology Recent Results (from the past 240 hour(s))  Resp Panel by RT-PCR (Flu A&B, Covid) Nasopharyngeal Swab     Status: None   Collection Time: 06/16/21 11:25 PM   Specimen: Nasopharyngeal Swab; Nasopharyngeal(NP) swabs in vial transport medium  Result Value Ref Range Status   SARS Coronavirus 2 by RT PCR NEGATIVE NEGATIVE Final    Comment: (NOTE) SARS-CoV-2 target nucleic acids are NOT DETECTED.  The SARS-CoV-2 RNA is generally detectable in upper respiratory specimens during the acute phase of infection. The lowest concentration of SARS-CoV-2 viral copies this assay can detect is 138 copies/mL. A negative result does not preclude SARS-Cov-2 infection and should not be used as the sole basis for treatment or other patient management decisions. A negative result may occur with  improper specimen collection/handling, submission of specimen other than nasopharyngeal swab, presence of viral mutation(s) within the areas targeted by this assay, and inadequate number of viral copies(<138 copies/mL). A negative result must be combined with clinical observations, patient history, and epidemiological information. The expected result is Negative.  Fact Sheet for Patients:  BloggerCourse.com  Fact Sheet for Healthcare Providers:   SeriousBroker.it  This test is no t yet approved or cleared by the [place] FDA and  has been authorized for detection and/or diagnosis of SARS-CoV-2 by FDA under an Emergency Use Authorization (EUA). This EUA will remain  in effect (meaning this test can be used) for the duration of the COVID-19 declaration under Section 564(b)(1) of the Act, 21 U.S.C.section 360bbb-3(b)(1), unless the authorization is terminated  or revoked sooner.       Influenza A by PCR NEGATIVE NEGATIVE Final   Influenza B by PCR NEGATIVE NEGATIVE Final    Comment: (NOTE) The Xpert Xpress SARS-CoV-2/FLU/RSV plus assay is intended as an aid in the diagnosis of influenza from Nasopharyngeal swab specimens and should not be used as a sole basis for treatment. Nasal washings and aspirates are unacceptable for Xpert Xpress SARS-CoV-2/FLU/RSV testing.  Fact Sheet for Patients: BloggerCourse.com  Fact Sheet for Healthcare Providers: SeriousBroker.it  This test is not yet approved or cleared by the [place] FDA and has been authorized for detection and/or diagnosis of SARS-CoV-2 by FDA under an Emergency Use Authorization (EUA). This EUA will remain in effect (meaning this test can be used) for the duration of the COVID-19 declaration under Section 564(b)(1) of the Act, 21 U.S.C. section 360bbb-3(b)(1), unless the authorization is terminated or revoked.  Performed at [facility], [address] [place], [place] [address]   C Difficile Quick Screen w PCR reflex     Status: None   Collection Time: 06/17/21  9:20 AM   Specimen: STOOL  Result Value Ref Range Status   C Diff antigen NEGATIVE NEGATIVE Final   C Diff toxin NEGATIVE NEGATIVE Final   C Diff interpretation No C. difficile detected.  Final    Comment: Performed at [facility], [address], [place], [place] [address]   Gastrointestinal Panel by PCR , Stool     Status: None   Collection Time: 06/17/21  9:20 AM   Specimen: STOOL  Result Value Ref Range Status   Campylobacter species NOT DETECTED NOT DETECTED Final   Plesimonas shigelloides NOT DETECTED NOT DETECTED Final   Salmonella species NOT DETECTED NOT DETECTED  Final   Yersinia enterocolitica NOT DETECTED NOT DETECTED Final   Vibrio species NOT DETECTED NOT DETECTED Final   Vibrio cholerae NOT DETECTED NOT DETECTED Final   Enteroaggregative E coli (EAEC) NOT DETECTED NOT DETECTED Final   Enteropathogenic E coli (EPEC) NOT DETECTED NOT DETECTED Final   Enterotoxigenic E coli (ETEC) NOT DETECTED NOT DETECTED Final   Shiga like toxin producing E coli (STEC) NOT DETECTED NOT DETECTED Final   Shigella/Enteroinvasive E coli (EIEC) NOT DETECTED NOT DETECTED Final   Cryptosporidium NOT DETECTED NOT DETECTED Final   Cyclospora cayetanensis NOT DETECTED NOT DETECTED Final   Entamoeba histolytica NOT DETECTED NOT DETECTED Final   Giardia lamblia NOT DETECTED NOT DETECTED Final   Adenovirus F40/41 NOT DETECTED NOT DETECTED Final   Astrovirus NOT DETECTED NOT DETECTED Final   Norovirus GI/GII NOT DETECTED NOT DETECTED Final   Rotavirus A NOT DETECTED NOT DETECTED Final   Sapovirus (I, II, IV, and V) NOT DETECTED NOT DETECTED Final    Comment: Performed at [facility], [address] [place], [place] [address]     Patient was seen and examined on the day of discharge and was found to be in stable condition. Time coordinating discharge: 25 minutes including assessment and coordination of care, as well as examination of the patient.   SIGNED:  Noralee Stain, DO [facility] 06/26/2021, 8:26 AM

## 2021-06-27 LAB — VITAMIN B1: Vitamin B1 (Thiamine): 355.4 nmol/L — ABNORMAL HIGH (ref 66.5–200.0)

## 2021-07-08 ENCOUNTER — Other Ambulatory Visit (HOSPITAL_COMMUNITY): Payer: Self-pay

## 2021-08-06 ENCOUNTER — Encounter: Payer: Self-pay | Admitting: Physician Assistant

## 2021-08-06 NOTE — Progress Notes (Deleted)
Cardiology Office Note    Date:  08/06/2021   ID:  Warren Salas, DOB Jan 28, 1961, MRN [id number]  PCP:  Patient, No Pcp Per (Inactive)  Cardiologist:  Lance Muss, MD  Electrophysiologist:  None   Chief Complaint: ***  History of Present Illness:   Warren Salas is a 60 y.o. male experiencing homelessness (has lived in tent) with alcohol abuse, tobacco abuse, COPD, CAD with STEMI 10/2017 s/p DES to RCA, prior DES to LAD 12/2017, DM, chronic thrombocytopenia. He recently presented to the hospital with worsening diarrhea, weakness, dizziness, hyponatremia. Diarrhea was felt chronic and dizziness felt related to ETOH intoxication as levels were positive on admission. MRI brain was negative for acute CVA. Hospital course complicated by ETOH withdrawals/confusion with delirium/acute encephalopathy. Cardiology was consulted for narrow complex tachycardia felt SVT vs atrial flutter. He was treated with addition of beta blocker. OAC was not prescribed as he was felt to be a poor candidate given active ETOH, thrombocytopenia and social challenges. Patient declined discharge to SNF.  BMET/mg  Narrow complex tachycardia Thrombocytopenia with anemia CAD s/p prior MI/PCI Alcohol abuse   Labwork independently reviewed: 05/2021 Mg 1.9, K 3.5, Na 134, Cr OK, TSH wnl, albumin 3.2 otherwise LFTs ok   Past Medical History:  Diagnosis Date   Back pain    COPD (chronic obstructive pulmonary disease) (HCC)    Diabetes mellitus without complication (HCC)    Hypertension    STEMI (ST elevation myocardial infarction) (HCC) 10/2017    Past Surgical History:  Procedure Laterality Date   APPENDECTOMY     CORONARY STENT INTERVENTION N/A 12/29/2017   Procedure: CORONARY STENT INTERVENTION - LAD;  Surgeon: Corky Crafts, MD;  Location: [facility] INVASIVE CV LAB;  Service: Cardiovascular;  Laterality: N/A;   CORONARY/GRAFT ACUTE MI REVASCULARIZATION N/A 11/01/2017   Procedure: Coronary/Graft Acute MI  Revascularization;  Surgeon: Corky Crafts, MD;  Location: [facility] INVASIVE CV LAB;  Service: Cardiovascular;  Laterality: N/A;   LEFT HEART CATH AND CORONARY ANGIOGRAPHY N/A 11/01/2017   Procedure: LEFT HEART CATH AND CORONARY ANGIOGRAPHY;  Surgeon: Corky Crafts, MD;  Location: [facility] INVASIVE CV LAB;  Service: Cardiovascular;  Laterality: N/A;    Current Medications: No outpatient medications have been marked as taking for the 08/07/21 encounter (Appointment) with Laurann Montana, PA-C.   ***   Allergies:   Patient has no known allergies.   Social History   Socioeconomic History   Marital status: Divorced    Spouse name: Not on file   Number of children: 3   Years of education: Not on file   Highest education level: Not on file  Occupational History   Not on file  Tobacco Use   Smoking status: Every Day    Packs/day: 1.00    Types: Cigarettes   Smokeless tobacco: Never  Vaping Use   Vaping Use: Never used  Substance and Sexual Activity   Alcohol use: Yes    Alcohol/week: 12.0 standard drinks    Types: 12 Cans of beer per week   Drug use: Yes    Types: Cocaine    Comment: no current use   Sexual activity: Never  Other Topics Concern   Not on file  Social History Narrative   Not on file   Social Determinants of Health   Financial Resource Strain: Not on file  Food Insecurity: Not on file  Transportation Needs: Not on file  Physical Activity: Not on file  Stress: Not on file  Social Connections:  Not on file     Family History:  The patient's ***family history includes Diabetes in his father and mother; Hypertension in his father and mother.  ROS:   Please see the history of present illness. Otherwise, review of systems is positive for ***.  All other systems are reviewed and otherwise negative.    EKGs/Labs/Other Studies Reviewed:    Studies reviewed are outlined and summarized above. Reports included below if pertinent.  Echo 06/21/21  1. Left  ventricular ejection fraction, by estimation, is 60 to 65%. The  left ventricle has normal function. The left ventricle has no regional  wall motion abnormalities. Left ventricular diastolic parameters were  normal.   2. Right ventricular systolic function is normal. The right ventricular  size is normal. Tricuspid regurgitation signal is inadequate for assessing  PA pressure.   3. The mitral valve is normal in structure. Trivial mitral valve  regurgitation. No evidence of mitral stenosis.   4. The aortic valve is tricuspid. Aortic valve regurgitation is not  visualized. Mild to moderate aortic valve sclerosis/calcification is  present, without any evidence of aortic stenosis.   5. The inferior vena cava is normal in size with greater than 50%  respiratory variability, suggesting right atrial pressure of 3 mmHg.  Stent intervention 2019 Mid Cx lesion is 25% stenosed. Previously placed Mid RCA drug eluting stent is widely patent. Post Atrio lesion is 50% stenosed. Prox LAD lesion is 90% stenosed. A drug-eluting stent was successfully placed using Wolverine cutting balloon followed by a STENT SYNERGY DES 3X20, postdilated to 3.5 mm. Post intervention, there is a 0% residual stenosis. Mid LAD lesion is 75% stenosed. A drug-eluting stent was successfully placed using a STENT SYNERGY DES 2.75X28, postdilated to 3 mm. Diagonal between stents was not jailed. Post intervention, there is a 0% residual stenosis. Normal LVEDP.   Continue dual antiplatelet therapy for 12 months along with aggressive secondary prevention.     Plan for same day PCI.      EKG:  EKG is ordered today, personally reviewed, demonstrating ***  Recent Labs: 06/23/2021: ALT 17 06/24/2021: TSH 3.303 06/25/2021: BUN 8; Creatinine, Ser 0.70; Hemoglobin 12.0; Magnesium 1.9; Platelets 105; Potassium 3.5; Sodium 134  Recent Lipid Panel    Component Value Date/Time   CHOL 109 11/17/2018 0629   TRIG 89 11/17/2018 0629    HDL 60 11/17/2018 0629   CHOLHDL 1.8 11/17/2018 0629   VLDL 18 11/17/2018 0629   LDLCALC 31 11/17/2018 0629    PHYSICAL EXAM:    VS:  There were no vitals taken for this visit.  BMI: There is no height or weight on file to calculate BMI.  GEN: Well nourished, well developed male in no acute distress HEENT: normocephalic, atraumatic Neck: no JVD, carotid bruits, or masses Cardiac: ***RRR; no murmurs, rubs, or gallops, no edema  Respiratory:  clear to auscultation bilaterally, normal work of breathing GI: soft, nontender, nondistended, + BS MS: no deformity or atrophy Skin: warm and dry, no rash Neuro:  Alert and Oriented x 3, Strength and sensation are intact, follows commands Psych: euthymic mood, full affect  Wt Readings from Last 3 Encounters:  06/16/21 141 lb 1.5 oz (64 kg)  06/14/21 139 lb 15.9 oz (63.5 kg)  06/04/21 139 lb 15.9 oz (63.5 kg)     ASSESSMENT & PLAN:   ***     Disposition: F/u with ***   Medication Adjustments/Labs and Tests Ordered: Current medicines are reviewed at length with the patient today.  Concerns regarding medicines are outlined above. Medication changes, Labs and Tests ordered today are summarized above and listed in the Patient Instructions accessible in Encounters.   Signed, Laurann Montana, PA-C  08/06/2021 2:12 PM    [facility] [address] [place], [place], [place]  [address] Phone: [phone]; Fax: [phone]

## 2021-08-07 ENCOUNTER — Ambulatory Visit: Payer: Medicaid Other | Admitting: Physician Assistant

## 2021-08-07 DIAGNOSIS — I471 Supraventricular tachycardia: Secondary | ICD-10-CM

## 2021-08-07 DIAGNOSIS — D539 Nutritional anemia, unspecified: Secondary | ICD-10-CM

## 2021-08-07 DIAGNOSIS — F1010 Alcohol abuse, uncomplicated: Secondary | ICD-10-CM

## 2021-08-07 DIAGNOSIS — D696 Thrombocytopenia, unspecified: Secondary | ICD-10-CM

## 2021-08-07 DIAGNOSIS — I2510 Atherosclerotic heart disease of native coronary artery without angina pectoris: Secondary | ICD-10-CM
